# Patient Record
Sex: Male | Born: 1945 | Race: White | Hispanic: No | Marital: Married | Smoking: Never smoker
Health system: Southern US, Community
[De-identification: ages and names within clinical notes are randomized; demographics above are authoritative.]

## PROBLEM LIST (undated history)

## (undated) DIAGNOSIS — I429 Cardiomyopathy, unspecified: Secondary | ICD-10-CM

## (undated) DIAGNOSIS — I447 Left bundle-branch block, unspecified: Secondary | ICD-10-CM

## (undated) DIAGNOSIS — E785 Hyperlipidemia, unspecified: Secondary | ICD-10-CM

## (undated) DIAGNOSIS — N529 Male erectile dysfunction, unspecified: Secondary | ICD-10-CM

## (undated) DIAGNOSIS — G43909 Migraine, unspecified, not intractable, without status migrainosus: Secondary | ICD-10-CM

## (undated) DIAGNOSIS — N183 Chronic kidney disease, stage 3 unspecified: Secondary | ICD-10-CM

## (undated) DIAGNOSIS — E039 Hypothyroidism, unspecified: Secondary | ICD-10-CM

## (undated) DIAGNOSIS — I2510 Atherosclerotic heart disease of native coronary artery without angina pectoris: Secondary | ICD-10-CM

## (undated) DIAGNOSIS — J189 Pneumonia, unspecified organism: Secondary | ICD-10-CM

## (undated) DIAGNOSIS — R51 Headache: Secondary | ICD-10-CM

## (undated) DIAGNOSIS — R351 Nocturia: Secondary | ICD-10-CM

## (undated) DIAGNOSIS — C801 Malignant (primary) neoplasm, unspecified: Secondary | ICD-10-CM

## (undated) DIAGNOSIS — N3281 Overactive bladder: Secondary | ICD-10-CM

## (undated) DIAGNOSIS — K219 Gastro-esophageal reflux disease without esophagitis: Secondary | ICD-10-CM

## (undated) DIAGNOSIS — R519 Headache, unspecified: Secondary | ICD-10-CM

## (undated) DIAGNOSIS — M1990 Unspecified osteoarthritis, unspecified site: Secondary | ICD-10-CM

## (undated) DIAGNOSIS — I10 Essential (primary) hypertension: Secondary | ICD-10-CM

## (undated) HISTORY — DX: Hyperlipidemia, unspecified: E78.5

## (undated) HISTORY — PX: CARDIAC CATHETERIZATION: SHX172

## (undated) HISTORY — PX: CARPAL TUNNEL RELEASE: SHX101

## (undated) HISTORY — PX: PROSTATE SURGERY: SHX751

## (undated) HISTORY — DX: Male erectile dysfunction, unspecified: N52.9

## (undated) HISTORY — DX: Migraine, unspecified, not intractable, without status migrainosus: G43.909

## (undated) HISTORY — DX: Chronic kidney disease, stage 3 unspecified: N18.30

---

## 2003-02-06 ENCOUNTER — Ambulatory Visit: Admission: RE | Admit: 2003-02-06 | Discharge: 2003-02-24 | Payer: Self-pay | Admitting: Radiation Oncology

## 2003-03-10 ENCOUNTER — Ambulatory Visit: Admission: RE | Admit: 2003-03-10 | Discharge: 2003-04-30 | Payer: Self-pay | Admitting: Radiation Oncology

## 2006-02-16 ENCOUNTER — Ambulatory Visit (HOSPITAL_COMMUNITY): Admission: RE | Admit: 2006-02-16 | Discharge: 2006-02-17 | Payer: Self-pay | Admitting: Urology

## 2011-11-14 DIAGNOSIS — H524 Presbyopia: Secondary | ICD-10-CM | POA: Diagnosis not present

## 2011-11-14 DIAGNOSIS — H40059 Ocular hypertension, unspecified eye: Secondary | ICD-10-CM | POA: Diagnosis not present

## 2011-11-14 DIAGNOSIS — H40009 Preglaucoma, unspecified, unspecified eye: Secondary | ICD-10-CM | POA: Diagnosis not present

## 2011-11-25 DIAGNOSIS — L570 Actinic keratosis: Secondary | ICD-10-CM | POA: Diagnosis not present

## 2012-02-10 DIAGNOSIS — E785 Hyperlipidemia, unspecified: Secondary | ICD-10-CM | POA: Diagnosis not present

## 2012-02-10 DIAGNOSIS — I10 Essential (primary) hypertension: Secondary | ICD-10-CM | POA: Diagnosis not present

## 2012-03-23 DIAGNOSIS — Z85820 Personal history of malignant melanoma of skin: Secondary | ICD-10-CM | POA: Diagnosis not present

## 2012-03-23 DIAGNOSIS — L821 Other seborrheic keratosis: Secondary | ICD-10-CM | POA: Diagnosis not present

## 2012-03-23 DIAGNOSIS — T148 Other injury of unspecified body region: Secondary | ICD-10-CM | POA: Diagnosis not present

## 2012-03-23 DIAGNOSIS — D239 Other benign neoplasm of skin, unspecified: Secondary | ICD-10-CM | POA: Diagnosis not present

## 2012-03-23 DIAGNOSIS — L819 Disorder of pigmentation, unspecified: Secondary | ICD-10-CM | POA: Diagnosis not present

## 2012-03-23 DIAGNOSIS — L570 Actinic keratosis: Secondary | ICD-10-CM | POA: Diagnosis not present

## 2012-04-16 DIAGNOSIS — C61 Malignant neoplasm of prostate: Secondary | ICD-10-CM | POA: Diagnosis not present

## 2012-04-16 DIAGNOSIS — I10 Essential (primary) hypertension: Secondary | ICD-10-CM | POA: Diagnosis not present

## 2012-04-16 DIAGNOSIS — E785 Hyperlipidemia, unspecified: Secondary | ICD-10-CM | POA: Diagnosis not present

## 2012-04-16 DIAGNOSIS — K219 Gastro-esophageal reflux disease without esophagitis: Secondary | ICD-10-CM | POA: Diagnosis not present

## 2012-04-16 DIAGNOSIS — Z23 Encounter for immunization: Secondary | ICD-10-CM | POA: Diagnosis not present

## 2012-04-16 DIAGNOSIS — Z0000 Encounter for general adult medical examination without abnormal findings: Secondary | ICD-10-CM | POA: Diagnosis not present

## 2012-07-09 DIAGNOSIS — L570 Actinic keratosis: Secondary | ICD-10-CM | POA: Diagnosis not present

## 2012-08-31 DIAGNOSIS — Z23 Encounter for immunization: Secondary | ICD-10-CM | POA: Diagnosis not present

## 2012-09-07 DIAGNOSIS — L570 Actinic keratosis: Secondary | ICD-10-CM | POA: Diagnosis not present

## 2012-09-07 DIAGNOSIS — L821 Other seborrheic keratosis: Secondary | ICD-10-CM | POA: Diagnosis not present

## 2012-10-04 DIAGNOSIS — M1710 Unilateral primary osteoarthritis, unspecified knee: Secondary | ICD-10-CM | POA: Diagnosis not present

## 2012-10-15 DIAGNOSIS — M1710 Unilateral primary osteoarthritis, unspecified knee: Secondary | ICD-10-CM | POA: Diagnosis not present

## 2012-10-18 DIAGNOSIS — L570 Actinic keratosis: Secondary | ICD-10-CM | POA: Diagnosis not present

## 2012-10-18 DIAGNOSIS — L821 Other seborrheic keratosis: Secondary | ICD-10-CM | POA: Diagnosis not present

## 2012-10-22 DIAGNOSIS — M1710 Unilateral primary osteoarthritis, unspecified knee: Secondary | ICD-10-CM | POA: Diagnosis not present

## 2012-10-29 DIAGNOSIS — M1710 Unilateral primary osteoarthritis, unspecified knee: Secondary | ICD-10-CM | POA: Diagnosis not present

## 2012-11-05 DIAGNOSIS — M1710 Unilateral primary osteoarthritis, unspecified knee: Secondary | ICD-10-CM | POA: Diagnosis not present

## 2012-11-12 DIAGNOSIS — M1710 Unilateral primary osteoarthritis, unspecified knee: Secondary | ICD-10-CM | POA: Diagnosis not present

## 2012-11-19 DIAGNOSIS — M1710 Unilateral primary osteoarthritis, unspecified knee: Secondary | ICD-10-CM | POA: Diagnosis not present

## 2013-04-02 DIAGNOSIS — Z85828 Personal history of other malignant neoplasm of skin: Secondary | ICD-10-CM | POA: Diagnosis not present

## 2013-04-02 DIAGNOSIS — D239 Other benign neoplasm of skin, unspecified: Secondary | ICD-10-CM | POA: Diagnosis not present

## 2013-04-02 DIAGNOSIS — L570 Actinic keratosis: Secondary | ICD-10-CM | POA: Diagnosis not present

## 2013-04-02 DIAGNOSIS — L819 Disorder of pigmentation, unspecified: Secondary | ICD-10-CM | POA: Diagnosis not present

## 2013-04-02 DIAGNOSIS — L821 Other seborrheic keratosis: Secondary | ICD-10-CM | POA: Diagnosis not present

## 2013-04-02 DIAGNOSIS — Z85820 Personal history of malignant melanoma of skin: Secondary | ICD-10-CM | POA: Diagnosis not present

## 2013-05-13 DIAGNOSIS — M1610 Unilateral primary osteoarthritis, unspecified hip: Secondary | ICD-10-CM | POA: Diagnosis not present

## 2013-07-29 DIAGNOSIS — T148 Other injury of unspecified body region: Secondary | ICD-10-CM | POA: Diagnosis not present

## 2013-07-29 DIAGNOSIS — IMO0001 Reserved for inherently not codable concepts without codable children: Secondary | ICD-10-CM | POA: Diagnosis not present

## 2013-07-29 DIAGNOSIS — I10 Essential (primary) hypertension: Secondary | ICD-10-CM | POA: Diagnosis not present

## 2013-07-29 DIAGNOSIS — R21 Rash and other nonspecific skin eruption: Secondary | ICD-10-CM | POA: Diagnosis not present

## 2013-07-29 DIAGNOSIS — R209 Unspecified disturbances of skin sensation: Secondary | ICD-10-CM | POA: Diagnosis not present

## 2013-08-06 DIAGNOSIS — L821 Other seborrheic keratosis: Secondary | ICD-10-CM | POA: Diagnosis not present

## 2013-08-06 DIAGNOSIS — L723 Sebaceous cyst: Secondary | ICD-10-CM | POA: Diagnosis not present

## 2013-08-06 DIAGNOSIS — L259 Unspecified contact dermatitis, unspecified cause: Secondary | ICD-10-CM | POA: Diagnosis not present

## 2013-08-06 DIAGNOSIS — L570 Actinic keratosis: Secondary | ICD-10-CM | POA: Diagnosis not present

## 2013-09-20 DIAGNOSIS — E785 Hyperlipidemia, unspecified: Secondary | ICD-10-CM | POA: Diagnosis not present

## 2013-09-20 DIAGNOSIS — Z8546 Personal history of malignant neoplasm of prostate: Secondary | ICD-10-CM | POA: Diagnosis not present

## 2013-09-20 DIAGNOSIS — I10 Essential (primary) hypertension: Secondary | ICD-10-CM | POA: Diagnosis not present

## 2013-09-23 DIAGNOSIS — C61 Malignant neoplasm of prostate: Secondary | ICD-10-CM | POA: Diagnosis not present

## 2013-09-23 DIAGNOSIS — N529 Male erectile dysfunction, unspecified: Secondary | ICD-10-CM | POA: Diagnosis not present

## 2013-09-23 DIAGNOSIS — I10 Essential (primary) hypertension: Secondary | ICD-10-CM | POA: Diagnosis not present

## 2013-09-23 DIAGNOSIS — Z23 Encounter for immunization: Secondary | ICD-10-CM | POA: Diagnosis not present

## 2013-09-23 DIAGNOSIS — E785 Hyperlipidemia, unspecified: Secondary | ICD-10-CM | POA: Diagnosis not present

## 2013-09-23 DIAGNOSIS — M1990 Unspecified osteoarthritis, unspecified site: Secondary | ICD-10-CM | POA: Diagnosis not present

## 2013-11-04 DIAGNOSIS — M1610 Unilateral primary osteoarthritis, unspecified hip: Secondary | ICD-10-CM | POA: Diagnosis not present

## 2013-11-04 DIAGNOSIS — M1710 Unilateral primary osteoarthritis, unspecified knee: Secondary | ICD-10-CM | POA: Diagnosis not present

## 2014-01-06 DIAGNOSIS — M1710 Unilateral primary osteoarthritis, unspecified knee: Secondary | ICD-10-CM | POA: Diagnosis not present

## 2014-01-13 DIAGNOSIS — M1710 Unilateral primary osteoarthritis, unspecified knee: Secondary | ICD-10-CM | POA: Diagnosis not present

## 2014-01-21 DIAGNOSIS — M1710 Unilateral primary osteoarthritis, unspecified knee: Secondary | ICD-10-CM | POA: Diagnosis not present

## 2014-04-15 DIAGNOSIS — L570 Actinic keratosis: Secondary | ICD-10-CM | POA: Diagnosis not present

## 2014-04-15 DIAGNOSIS — L821 Other seborrheic keratosis: Secondary | ICD-10-CM | POA: Diagnosis not present

## 2014-04-15 DIAGNOSIS — L819 Disorder of pigmentation, unspecified: Secondary | ICD-10-CM | POA: Diagnosis not present

## 2014-04-15 DIAGNOSIS — Z85828 Personal history of other malignant neoplasm of skin: Secondary | ICD-10-CM | POA: Diagnosis not present

## 2014-04-15 DIAGNOSIS — D485 Neoplasm of uncertain behavior of skin: Secondary | ICD-10-CM | POA: Diagnosis not present

## 2014-04-15 DIAGNOSIS — Z85820 Personal history of malignant melanoma of skin: Secondary | ICD-10-CM | POA: Diagnosis not present

## 2014-04-15 DIAGNOSIS — D239 Other benign neoplasm of skin, unspecified: Secondary | ICD-10-CM | POA: Diagnosis not present

## 2014-04-15 DIAGNOSIS — D692 Other nonthrombocytopenic purpura: Secondary | ICD-10-CM | POA: Diagnosis not present

## 2014-04-16 DIAGNOSIS — D0430 Carcinoma in situ of skin of unspecified part of face: Secondary | ICD-10-CM | POA: Diagnosis not present

## 2014-04-16 DIAGNOSIS — D0439 Carcinoma in situ of skin of other parts of face: Secondary | ICD-10-CM | POA: Diagnosis not present

## 2014-04-22 DIAGNOSIS — C61 Malignant neoplasm of prostate: Secondary | ICD-10-CM | POA: Diagnosis not present

## 2014-04-22 DIAGNOSIS — Z1159 Encounter for screening for other viral diseases: Secondary | ICD-10-CM | POA: Diagnosis not present

## 2014-04-22 DIAGNOSIS — Z23 Encounter for immunization: Secondary | ICD-10-CM | POA: Diagnosis not present

## 2014-04-22 DIAGNOSIS — Z1211 Encounter for screening for malignant neoplasm of colon: Secondary | ICD-10-CM | POA: Diagnosis not present

## 2014-04-22 DIAGNOSIS — I10 Essential (primary) hypertension: Secondary | ICD-10-CM | POA: Diagnosis not present

## 2014-04-22 DIAGNOSIS — N318 Other neuromuscular dysfunction of bladder: Secondary | ICD-10-CM | POA: Diagnosis not present

## 2014-04-22 DIAGNOSIS — E785 Hyperlipidemia, unspecified: Secondary | ICD-10-CM | POA: Diagnosis not present

## 2014-04-22 DIAGNOSIS — Z0000 Encounter for general adult medical examination without abnormal findings: Secondary | ICD-10-CM | POA: Diagnosis not present

## 2014-05-01 DIAGNOSIS — D0439 Carcinoma in situ of skin of other parts of face: Secondary | ICD-10-CM | POA: Diagnosis not present

## 2014-05-01 DIAGNOSIS — D0430 Carcinoma in situ of skin of unspecified part of face: Secondary | ICD-10-CM | POA: Diagnosis not present

## 2014-05-27 DIAGNOSIS — Z1211 Encounter for screening for malignant neoplasm of colon: Secondary | ICD-10-CM | POA: Diagnosis not present

## 2014-06-18 DIAGNOSIS — I10 Essential (primary) hypertension: Secondary | ICD-10-CM | POA: Diagnosis not present

## 2014-06-18 DIAGNOSIS — R609 Edema, unspecified: Secondary | ICD-10-CM | POA: Diagnosis not present

## 2014-08-04 DIAGNOSIS — H3531 Nonexudative age-related macular degeneration: Secondary | ICD-10-CM | POA: Diagnosis not present

## 2014-08-04 DIAGNOSIS — H35033 Hypertensive retinopathy, bilateral: Secondary | ICD-10-CM | POA: Diagnosis not present

## 2014-08-04 DIAGNOSIS — H01009 Unspecified blepharitis unspecified eye, unspecified eyelid: Secondary | ICD-10-CM | POA: Diagnosis not present

## 2014-08-04 DIAGNOSIS — H524 Presbyopia: Secondary | ICD-10-CM | POA: Diagnosis not present

## 2014-09-16 ENCOUNTER — Emergency Department: Payer: Self-pay | Admitting: Emergency Medicine

## 2014-09-16 DIAGNOSIS — I6523 Occlusion and stenosis of bilateral carotid arteries: Secondary | ICD-10-CM | POA: Diagnosis not present

## 2014-09-16 DIAGNOSIS — Z79899 Other long term (current) drug therapy: Secondary | ICD-10-CM | POA: Diagnosis not present

## 2014-09-16 DIAGNOSIS — I10 Essential (primary) hypertension: Secondary | ICD-10-CM | POA: Diagnosis not present

## 2014-09-16 DIAGNOSIS — Z880 Allergy status to penicillin: Secondary | ICD-10-CM | POA: Diagnosis not present

## 2014-09-16 DIAGNOSIS — R51 Headache: Secondary | ICD-10-CM | POA: Diagnosis not present

## 2014-09-16 DIAGNOSIS — R4789 Other speech disturbances: Secondary | ICD-10-CM | POA: Diagnosis not present

## 2014-09-16 LAB — CBC
HCT: 49.8 % (ref 40.0–52.0)
HGB: 16.3 g/dL (ref 13.0–18.0)
MCH: 29.4 pg (ref 26.0–34.0)
MCHC: 32.7 g/dL (ref 32.0–36.0)
MCV: 90 fL (ref 80–100)
PLATELETS: 229 10*3/uL (ref 150–440)
RBC: 5.54 10*6/uL (ref 4.40–5.90)
RDW: 12.7 % (ref 11.5–14.5)
WBC: 12.5 10*3/uL — ABNORMAL HIGH (ref 3.8–10.6)

## 2014-09-16 LAB — BASIC METABOLIC PANEL
ANION GAP: 9 (ref 7–16)
BUN: 22 mg/dL — AB (ref 7–18)
CHLORIDE: 101 mmol/L (ref 98–107)
Calcium, Total: 10.3 mg/dL — ABNORMAL HIGH (ref 8.5–10.1)
Co2: 27 mmol/L (ref 21–32)
Creatinine: 1.35 mg/dL — ABNORMAL HIGH (ref 0.60–1.30)
EGFR (African American): >60
EGFR (Non-African Amer.): 56 — ABNORMAL LOW
Glucose: 100 mg/dL — ABNORMAL HIGH (ref 65–99)
OSMOLALITY: 277 (ref 275–301)
POTASSIUM: 3.9 mmol/L (ref 3.5–5.1)
Sodium: 137 mmol/L (ref 136–145)

## 2014-09-19 DIAGNOSIS — K219 Gastro-esophageal reflux disease without esophagitis: Secondary | ICD-10-CM | POA: Diagnosis not present

## 2014-09-19 DIAGNOSIS — I10 Essential (primary) hypertension: Secondary | ICD-10-CM | POA: Diagnosis not present

## 2014-09-19 DIAGNOSIS — G43909 Migraine, unspecified, not intractable, without status migrainosus: Secondary | ICD-10-CM | POA: Diagnosis not present

## 2014-09-19 DIAGNOSIS — J309 Allergic rhinitis, unspecified: Secondary | ICD-10-CM | POA: Diagnosis not present

## 2014-09-19 DIAGNOSIS — E785 Hyperlipidemia, unspecified: Secondary | ICD-10-CM | POA: Diagnosis not present

## 2014-09-19 DIAGNOSIS — C61 Malignant neoplasm of prostate: Secondary | ICD-10-CM | POA: Diagnosis not present

## 2014-09-19 DIAGNOSIS — M1990 Unspecified osteoarthritis, unspecified site: Secondary | ICD-10-CM | POA: Diagnosis not present

## 2015-04-16 DIAGNOSIS — D0439 Carcinoma in situ of skin of other parts of face: Secondary | ICD-10-CM | POA: Diagnosis not present

## 2015-04-16 DIAGNOSIS — Z85820 Personal history of malignant melanoma of skin: Secondary | ICD-10-CM | POA: Diagnosis not present

## 2015-04-16 DIAGNOSIS — L821 Other seborrheic keratosis: Secondary | ICD-10-CM | POA: Diagnosis not present

## 2015-04-16 DIAGNOSIS — D1800 Hemangioma unspecified site: Secondary | ICD-10-CM | POA: Diagnosis not present

## 2015-04-16 DIAGNOSIS — Z85828 Personal history of other malignant neoplasm of skin: Secondary | ICD-10-CM | POA: Diagnosis not present

## 2015-04-16 DIAGNOSIS — L570 Actinic keratosis: Secondary | ICD-10-CM | POA: Diagnosis not present

## 2015-04-16 DIAGNOSIS — L814 Other melanin hyperpigmentation: Secondary | ICD-10-CM | POA: Diagnosis not present

## 2015-04-16 DIAGNOSIS — D225 Melanocytic nevi of trunk: Secondary | ICD-10-CM | POA: Diagnosis not present

## 2015-07-09 DIAGNOSIS — I10 Essential (primary) hypertension: Secondary | ICD-10-CM | POA: Diagnosis not present

## 2015-07-09 DIAGNOSIS — E785 Hyperlipidemia, unspecified: Secondary | ICD-10-CM | POA: Diagnosis not present

## 2015-07-09 DIAGNOSIS — Z8546 Personal history of malignant neoplasm of prostate: Secondary | ICD-10-CM | POA: Diagnosis not present

## 2015-07-09 DIAGNOSIS — M1990 Unspecified osteoarthritis, unspecified site: Secondary | ICD-10-CM | POA: Diagnosis not present

## 2015-07-09 DIAGNOSIS — Z1211 Encounter for screening for malignant neoplasm of colon: Secondary | ICD-10-CM | POA: Diagnosis not present

## 2015-07-09 DIAGNOSIS — Z23 Encounter for immunization: Secondary | ICD-10-CM | POA: Diagnosis not present

## 2015-07-09 DIAGNOSIS — Z0000 Encounter for general adult medical examination without abnormal findings: Secondary | ICD-10-CM | POA: Diagnosis not present

## 2015-07-21 DIAGNOSIS — M1612 Unilateral primary osteoarthritis, left hip: Secondary | ICD-10-CM | POA: Diagnosis not present

## 2015-07-23 DIAGNOSIS — Z1211 Encounter for screening for malignant neoplasm of colon: Secondary | ICD-10-CM | POA: Diagnosis not present

## 2015-08-03 DIAGNOSIS — D0439 Carcinoma in situ of skin of other parts of face: Secondary | ICD-10-CM | POA: Diagnosis not present

## 2015-08-03 DIAGNOSIS — L309 Dermatitis, unspecified: Secondary | ICD-10-CM | POA: Diagnosis not present

## 2015-08-03 DIAGNOSIS — L820 Inflamed seborrheic keratosis: Secondary | ICD-10-CM | POA: Diagnosis not present

## 2015-08-03 DIAGNOSIS — L570 Actinic keratosis: Secondary | ICD-10-CM | POA: Diagnosis not present

## 2015-08-04 DIAGNOSIS — L820 Inflamed seborrheic keratosis: Secondary | ICD-10-CM | POA: Diagnosis not present

## 2015-09-02 DIAGNOSIS — M1612 Unilateral primary osteoarthritis, left hip: Secondary | ICD-10-CM | POA: Diagnosis not present

## 2015-09-02 DIAGNOSIS — M25562 Pain in left knee: Secondary | ICD-10-CM | POA: Diagnosis not present

## 2015-09-02 DIAGNOSIS — M25552 Pain in left hip: Secondary | ICD-10-CM | POA: Diagnosis not present

## 2015-10-06 NOTE — Patient Instructions (Signed)
[facility]  10/06/2015   Your procedure is scheduled on: 10/16/2015   Report to [facility] Main  Entrance take [place]  elevators to 3rd floor to  [place] at   [place] AM.  Call this number if you have problems the morning of surgery [phone]   Remember: ONLY 1 PERSON MAY GO WITH YOU TO SHORT STAY TO GET  READY MORNING OF YOUR SURGERY.  Do not eat food or drink liquids :After Midnight.     Take these medicines the morning of surgery with A SIP OF WATER: none                                 You may not have any metal on your body including hair pins and              piercings  Do not wear jewelry, , lotions, powders or perfumes, deodorant            .              Men may shave face and neck.   Do not bring valuables to the hospital. [place].  Contacts, dentures or bridgework may not be worn into surgery.  Leave suitcase in the car. After surgery it may be brought to your room.     Marland Kitchen    Special Instructions:coughing and deep breathing exercises, leg exercises               Please read over the following fact sheets you were given: _____________________________________________________________________             [facility] - Preparing for Surgery Before surgery, you can play an important role.  Because skin is not sterile, your skin needs to be as free of germs as possible.  You can reduce the number of germs on your skin by washing with CHG (chlorahexidine gluconate) soap before surgery.  CHG is an antiseptic cleaner which kills germs and bonds with the skin to continue killing germs even after washing. Please DO NOT use if you have an allergy to CHG or antibacterial soaps.  If your skin becomes reddened/irritated stop using the CHG and inform your nurse when you arrive at Short Stay. Do not shave (including legs and underarms) for at least 48 hours prior to the first CHG shower.  You  may shave your face/neck. Please follow these instructions carefully:  1.  Shower with CHG Soap the night before surgery and the  morning of Surgery.  2.  If you choose to wash your hair, wash your hair first as usual with your  normal  shampoo.  3.  After you shampoo, rinse your hair and body thoroughly to remove the  shampoo.                           4.  Use CHG as you would any other liquid soap.  You can apply chg directly  to the skin and wash                       Gently with a scrungie or clean washcloth.  5.  Apply the  CHG Soap to your body ONLY FROM THE NECK DOWN.   Do not use on face/ open                           Wound or open sores. Avoid contact with eyes, ears mouth and genitals (private parts).                       Wash face,  Genitals (private parts) with your normal soap.             6.  Wash thoroughly, paying special attention to the area where your surgery  will be performed.  7.  Thoroughly rinse your body with warm water from the neck down.  8.  DO NOT shower/wash with your normal soap after using and rinsing off  the CHG Soap.                9.  Pat yourself dry with a clean towel.            10.  Wear clean pajamas.            11.  Place clean sheets on your bed the night of your first shower and do not  sleep with pets. Day of Surgery : Do not apply any lotions/deodorants the morning of surgery.  Please wear clean clothes to the hospital/surgery center.  FAILURE TO FOLLOW THESE INSTRUCTIONS MAY RESULT IN THE CANCELLATION OF YOUR SURGERY PATIENT SIGNATURE_________________________________  NURSE SIGNATURE__________________________________  ________________________________________________________________________  WHAT IS A BLOOD TRANSFUSION? Blood Transfusion Information  A transfusion is the replacement of blood or some of its parts. Blood is made up of multiple cells which provide different functions.  Red blood cells carry oxygen and are used for blood loss  replacement.  White blood cells fight against infection.  Platelets control bleeding.  Plasma helps clot blood.  Other blood products are available for specialized needs, such as hemophilia or other clotting disorders. BEFORE THE TRANSFUSION  Who gives blood for transfusions?   Healthy volunteers who are fully evaluated to make sure their blood is safe. This is blood bank blood. Transfusion therapy is the safest it has ever been in the practice of medicine. Before blood is taken from a donor, a complete history is taken to make sure that person has no history of diseases nor engages in risky social behavior (examples are intravenous drug use or sexual activity with multiple partners). The donor's travel history is screened to minimize risk of transmitting infections, such as malaria. The donated blood is tested for signs of infectious diseases, such as HIV and hepatitis. The blood is then tested to be sure it is compatible with you in order to minimize the chance of a transfusion reaction. If you or a relative donates blood, this is often done in anticipation of surgery and is not appropriate for emergency situations. It takes many days to process the donated blood. RISKS AND COMPLICATIONS Although transfusion therapy is very safe and saves many lives, the main dangers of transfusion include:  1. Getting an infectious disease. 2. Developing a transfusion reaction. This is an allergic reaction to something in the blood you were given. Every precaution is taken to prevent this. The decision to have a blood transfusion has been considered carefully by your caregiver before blood is given. Blood is not given unless the benefits outweigh the risks. AFTER THE TRANSFUSION  Right after receiving a blood transfusion,  you will usually feel much better and more energetic. This is especially true if your red blood cells have gotten low (anemic). The transfusion raises the level of the red blood cells which  carry oxygen, and this usually causes an energy increase.  The nurse administering the transfusion will monitor you carefully for complications. HOME CARE INSTRUCTIONS  No special instructions are needed after a transfusion. You may find your energy is better. Speak with your caregiver about any limitations on activity for underlying diseases you may have. SEEK MEDICAL CARE IF:   Your condition is not improving after your transfusion.  You develop redness or irritation at the intravenous (IV) site. SEEK IMMEDIATE MEDICAL CARE IF:  Any of the following symptoms occur over the next 12 hours:  Shaking chills.  You have a temperature by mouth above 102 F (38.9 C), not controlled by medicine.  Chest, back, or muscle pain.  People around you feel you are not acting correctly or are confused.  Shortness of breath or difficulty breathing.  Dizziness and fainting.  You get a rash or develop hives.  You have a decrease in urine output.  Your urine turns a dark color or changes to pink, red, or brown. Any of the following symptoms occur over the next 10 days:  You have a temperature by mouth above 102 F (38.9 C), not controlled by medicine.  Shortness of breath.  Weakness after normal activity.  The white part of the eye turns yellow (jaundice).  You have a decrease in the amount of urine or are urinating less often.  Your urine turns a dark color or changes to pink, red, or brown. Document Released: 09/30/2000 Document Revised: 12/26/2011 Document Reviewed: 05/19/2008 ExitCare Patient Information 2014 [place].  _______________________________________________________________________  Incentive Spirometer  An incentive spirometer is a tool that can help keep your lungs clear and active. This tool measures how well you are filling your lungs with each breath. Taking long deep breaths may help reverse or decrease the chance of developing breathing (pulmonary) problems  (especially infection) following:  A long period of time when you are unable to move or be active. BEFORE THE PROCEDURE   If the spirometer includes an indicator to show your best effort, your nurse or respiratory therapist will set it to a desired goal.  If possible, sit up straight or lean slightly forward. Try not to slouch.  Hold the incentive spirometer in an upright position. INSTRUCTIONS FOR USE  3. Sit on the edge of your bed if possible, or sit up as far as you can in bed or on a chair. 4. Hold the incentive spirometer in an upright position. 5. Breathe out normally. 6. Place the mouthpiece in your mouth and seal your lips tightly around it. 7. Breathe in slowly and as deeply as possible, raising the piston or the ball toward the top of the column. 8. Hold your breath for 3-5 seconds or for as long as possible. Allow the piston or ball to fall to the bottom of the column. 9. Remove the mouthpiece from your mouth and breathe out normally. 10. Rest for a few seconds and repeat Steps 1 through 7 at least 10 times every 1-2 hours when you are awake. Take your time and take a few normal breaths between deep breaths. 11. The spirometer may include an indicator to show your best effort. Use the indicator as a goal to work toward during each repetition. 12. After each set of 10 deep breaths,  practice coughing to be sure your lungs are clear. If you have an incision (the cut made at the time of surgery), support your incision when coughing by placing a pillow or rolled up towels firmly against it. Once you are able to get out of bed, walk around indoors and cough well. You may stop using the incentive spirometer when instructed by your caregiver.  RISKS AND COMPLICATIONS  Take your time so you do not get dizzy or light-headed.  If you are in pain, you may need to take or ask for pain medication before doing incentive spirometry. It is harder to take a deep breath if you are having  pain. AFTER USE  Rest and breathe slowly and easily.  It can be helpful to keep track of a log of your progress. Your caregiver can provide you with a simple table to help with this. If you are using the spirometer at home, follow these instructions: [place] IF:   You are having difficultly using the spirometer.  You have trouble using the spirometer as often as instructed.  Your pain medication is not giving enough relief while using the spirometer.  You develop fever of 100.5 F (38.1 C) or higher. SEEK IMMEDIATE MEDICAL CARE IF:   You cough up bloody sputum that had not been present before.  You develop fever of 102 F (38.9 C) or greater.  You develop worsening pain at or near the incision site. MAKE SURE YOU:   Understand these instructions.  Will watch your condition.  Will get help right away if you are not doing well or get worse. Document Released: 02/13/2007 Document Revised: 12/26/2011 Document Reviewed: 04/16/2007 [facility] Patient Information 2014 [place], [place].   ________________________________________________________________________

## 2015-10-07 ENCOUNTER — Encounter (HOSPITAL_COMMUNITY)
Admission: RE | Admit: 2015-10-07 | Discharge: 2015-10-07 | Disposition: A | Discharge status: Home / Self Care | Payer: Medicare Other | Source: Ambulatory Visit | Attending: Orthopedic Surgery | Admitting: Orthopedic Surgery

## 2015-10-07 ENCOUNTER — Encounter (HOSPITAL_COMMUNITY): Payer: Self-pay

## 2015-10-07 DIAGNOSIS — Z01818 Encounter for other preprocedural examination: Secondary | ICD-10-CM | POA: Insufficient documentation

## 2015-10-07 DIAGNOSIS — M1612 Unilateral primary osteoarthritis, left hip: Secondary | ICD-10-CM | POA: Diagnosis not present

## 2015-10-07 DIAGNOSIS — Z01812 Encounter for preprocedural laboratory examination: Secondary | ICD-10-CM | POA: Insufficient documentation

## 2015-10-07 DIAGNOSIS — Z0183 Encounter for blood typing: Secondary | ICD-10-CM | POA: Insufficient documentation

## 2015-10-07 HISTORY — DX: Essential (primary) hypertension: I10

## 2015-10-07 HISTORY — DX: Gastro-esophageal reflux disease without esophagitis: K21.9

## 2015-10-07 HISTORY — DX: Headache, unspecified: R51.9

## 2015-10-07 HISTORY — DX: Pneumonia, unspecified organism: J18.9

## 2015-10-07 HISTORY — DX: Headache: R51

## 2015-10-07 HISTORY — DX: Overactive bladder: N32.81

## 2015-10-07 HISTORY — DX: Unspecified osteoarthritis, unspecified site: M19.90

## 2015-10-07 HISTORY — DX: Malignant (primary) neoplasm, unspecified: C80.1

## 2015-10-07 HISTORY — DX: Nocturia: R35.1

## 2015-10-07 LAB — CBC
HCT: 43.1 % (ref 39.0–52.0)
HEMOGLOBIN: 14.8 g/dL (ref 13.0–17.0)
MCH: 30.1 pg (ref 26.0–34.0)
MCHC: 34.3 g/dL (ref 30.0–36.0)
MCV: 87.6 fL (ref 78.0–100.0)
Platelets: 195 10*3/uL (ref 150–400)
RBC: 4.92 MIL/uL (ref 4.22–5.81)
RDW: 12.3 % (ref 11.5–15.5)
WBC: 7 10*3/uL (ref 4.0–10.5)

## 2015-10-07 LAB — URINALYSIS, ROUTINE W REFLEX MICROSCOPIC
Bilirubin Urine: NEGATIVE
GLUCOSE, UA: NEGATIVE mg/dL
KETONES UR: NEGATIVE mg/dL
LEUKOCYTES UA: NEGATIVE
Nitrite: NEGATIVE
PROTEIN: NEGATIVE mg/dL
Specific Gravity, Urine: 1.026 (ref 1.005–1.030)
pH: 5 (ref 5.0–8.0)

## 2015-10-07 LAB — BASIC METABOLIC PANEL
ANION GAP: 8 (ref 5–15)
BUN: 31 mg/dL — ABNORMAL HIGH (ref 6–20)
CHLORIDE: 104 mmol/L (ref 101–111)
CO2: 30 mmol/L (ref 22–32)
CREATININE: 1.42 mg/dL — AB (ref 0.61–1.24)
Calcium: 9.4 mg/dL (ref 8.9–10.3)
GFR calc non Af Amer: 49 mL/min — ABNORMAL LOW (ref >60)
GFR, EST AFRICAN AMERICAN: 56 mL/min — AB (ref >60)
Glucose, Bld: 97 mg/dL (ref 65–99)
POTASSIUM: 3.9 mmol/L (ref 3.5–5.1)
SODIUM: 142 mmol/L (ref 135–145)

## 2015-10-07 LAB — SURGICAL PCR SCREEN
MRSA, PCR: NEGATIVE
Staphylococcus aureus: NEGATIVE

## 2015-10-07 LAB — APTT: aPTT: 31 seconds (ref 24–37)

## 2015-10-07 LAB — URINE MICROSCOPIC-ADD ON: WBC, UA: NONE SEEN WBC/hpf (ref 0–5)

## 2015-10-07 LAB — PROTIME-INR
INR: 1.03 (ref 0.00–1.49)
Prothrombin Time: 13.7 seconds (ref 11.6–15.2)

## 2015-10-07 NOTE — Progress Notes (Signed)
BMP done 10/07/15 faxed via EPIC to Dr Alvan Dame.

## 2015-10-07 NOTE — Progress Notes (Signed)
10/07/15 Final EKG in EPIC

## 2015-10-07 NOTE — Progress Notes (Signed)
Requested LOV note from office of Dr Azalia Bilis.

## 2015-10-07 NOTE — Progress Notes (Signed)
U/A and micro results done 10/07/15 faxed via EPIC to Dr Alvan Dame.

## 2015-10-07 NOTE — Progress Notes (Signed)
Dr Kenton Kingfisher- clearance on chart- 07/09/15.

## 2015-10-07 NOTE — Progress Notes (Signed)
BMP results done 10/07/15 faxed via EPIC to Dr Alvan Dame.

## 2015-10-08 NOTE — Progress Notes (Signed)
Rerequested LOV note from office of Dr Azalia Bilis.

## 2015-10-08 NOTE — Progress Notes (Signed)
Patient has allergy to Penicillin- rash.  Ancef ordered FYI.

## 2015-10-14 NOTE — H&P (Signed)
TOTAL HIP ADMISSION H&P  Patient is admitted for left total hip arthroplasty, anterior approach.  Subjective:  Chief Complaint:    Left hip primary OA / pain  HPI: NAME Tyler David, 69 y.o. male, has a history of pain and functional disability in the left hip(s) due to arthritis and patient has failed non-surgical conservative treatments for greater than 12 weeks to include NSAID's and/or analgesics and activity modification.  Onset of symptoms was gradual starting 3-4 years ago with gradually worsening course since that time.The patient noted no past surgery on the left hip(s).  Patient currently rates pain in the left hip at 8 out of 10 with activity. Patient has worsening of pain with activity and weight bearing, trendelenberg gait, pain that interfers with activities of daily living, pain with passive range of motion, crepitus and joint swelling. Patient has evidence of periarticular osteophytes and joint space narrowing by imaging studies. This condition presents safety issues increasing the risk of falls.   There is no current active infection.  Risks, benefits and expectations were discussed with the patient.  Risks including but not limited to the risk of anesthesia, blood clots, nerve damage, blood vessel damage, failure of the prosthesis, infection and up to and including death.  Patient understand the risks, benefits and expectations and wishes to proceed with surgery.   PCP: Shirline Frees, MD  D/C Plans:      Home with HHPT  Post-op Meds:       No Rx given  Tranexamic Acid:      To be given - IV  Decadron:      Is to be given  FYI:     ASA post-op  Norco post-op  Per PCP - watch kidney function    Past Medical History  Diagnosis Date  . Hypertension   . Pneumonia     hx of at age 53   . Nocturia   . GERD (gastroesophageal reflux disease)   . Headache     hx of migraine-2015   . Arthritis   . Overactive bladder   . Cancer [facility])     prostate cancer     Past Surgical  History  Procedure Laterality Date  . Prostate surgery      No prescriptions prior to admission   Allergies  Allergen Reactions  . Statins Other (See Comments)    Muscle pain  . Penicillins Rash    Has patient had a PCN reaction causing immediate rash, facial/tongue/throat swelling, SOB or lightheadedness with hypotension: No Has patient had a PCN reaction causing severe rash involving mucus membranes or skin necrosis: No Has patient had a PCN reaction that required hospitalization No Has patient had a PCN reaction occurring within the last 10 years: No If all of the above answers are "NO", then may proceed with Cephalosporin use.     Social History  Substance Use Topics  . Smoking status: Never Smoker   . Smokeless tobacco: Never Used  . Alcohol Use: Yes     Comment: rare       Review of Systems  Constitutional: Negative.   HENT: Positive for tinnitus.   Eyes: Negative.   Respiratory: Negative.   Cardiovascular: Negative.   Gastrointestinal: Positive for heartburn.  Genitourinary: Positive for frequency.  Musculoskeletal: Positive for joint pain.  Skin: Negative.   Neurological: Positive for headaches.  Endo/Heme/Allergies: Negative.   Psychiatric/Behavioral: Negative.     Objective:  Physical Exam  Constitutional: He is oriented to person, place, and  time. He appears well-developed.  HENT:  Head: Normocephalic.  Eyes: Pupils are equal, round, and reactive to light.  Neck: Neck supple. No JVD present. No tracheal deviation present. No thyromegaly present.  Cardiovascular: Normal rate, regular rhythm, normal heart sounds and intact distal pulses.   Respiratory: Effort normal and breath sounds normal. No stridor. No respiratory distress. He has no wheezes.  GI: Soft. There is no tenderness. There is no guarding.  Musculoskeletal:       Left hip: He exhibits decreased range of motion, decreased strength, tenderness and bony tenderness. He exhibits no swelling, no  deformity and no laceration.  Lymphadenopathy:    He has no cervical adenopathy.  Neurological: He is alert and oriented to person, place, and time.  Skin: Skin is warm and dry.  Psychiatric: He has a normal mood and affect.     Imaging Review Plain radiographs demonstrate severe degenerative joint disease of the left hip(s). The bone quality appears to be good for age and reported activity level.  Assessment/Plan:  End stage arthritis, left hip(s)  The patient history, physical examination, clinical judgement of the provider and imaging studies are consistent with end stage degenerative joint disease of the left hip(s) and total hip arthroplasty is deemed medically necessary. The treatment options including medical management, injection therapy, arthroscopy and arthroplasty were discussed at length. The risks and benefits of total hip arthroplasty were presented and reviewed. The risks due to aseptic loosening, infection, stiffness, dislocation/subluxation,  thromboembolic complications and other imponderables were discussed.  The patient acknowledged the explanation, agreed to proceed with the plan and consent was signed. Patient is being admitted for inpatient treatment for surgery, pain control, PT, OT, prophylactic antibiotics, VTE prophylaxis, progressive ambulation and ADL's and discharge planning.The patient is planning to be discharged home with home health services.      [place] Pugh Arlon Bleier   PA-C  10/14/2015, 10:13 PM

## 2015-10-15 NOTE — Progress Notes (Signed)
LOV - 07/09/15- placed on chart from PCP- Dr Shirline Frees.

## 2015-10-16 ENCOUNTER — Inpatient Hospital Stay (HOSPITAL_COMMUNITY): Payer: Medicare Other

## 2015-10-16 ENCOUNTER — Encounter (HOSPITAL_COMMUNITY)
Admission: RE | Disposition: A | Discharge status: Home Health | Payer: Self-pay | Source: Ambulatory Visit | Attending: Orthopedic Surgery

## 2015-10-16 ENCOUNTER — Inpatient Hospital Stay (HOSPITAL_COMMUNITY)
Admission: RE | Admit: 2015-10-16 | Discharge: 2015-10-17 | DRG: 470 | Disposition: A | Discharge status: Home Health | Payer: Medicare Other | Source: Ambulatory Visit | Attending: Orthopedic Surgery | Admitting: Orthopedic Surgery

## 2015-10-16 ENCOUNTER — Encounter (HOSPITAL_COMMUNITY): Payer: Self-pay | Admitting: *Deleted

## 2015-10-16 ENCOUNTER — Inpatient Hospital Stay (HOSPITAL_COMMUNITY): Payer: Medicare Other | Admitting: Anesthesiology

## 2015-10-16 DIAGNOSIS — Z8546 Personal history of malignant neoplasm of prostate: Secondary | ICD-10-CM | POA: Diagnosis not present

## 2015-10-16 DIAGNOSIS — Z01812 Encounter for preprocedural laboratory examination: Secondary | ICD-10-CM

## 2015-10-16 DIAGNOSIS — M25552 Pain in left hip: Secondary | ICD-10-CM | POA: Diagnosis not present

## 2015-10-16 DIAGNOSIS — M1612 Unilateral primary osteoarthritis, left hip: Secondary | ICD-10-CM | POA: Diagnosis present

## 2015-10-16 DIAGNOSIS — M169 Osteoarthritis of hip, unspecified: Secondary | ICD-10-CM | POA: Diagnosis not present

## 2015-10-16 DIAGNOSIS — K219 Gastro-esophageal reflux disease without esophagitis: Secondary | ICD-10-CM | POA: Diagnosis present

## 2015-10-16 DIAGNOSIS — I10 Essential (primary) hypertension: Secondary | ICD-10-CM | POA: Diagnosis present

## 2015-10-16 DIAGNOSIS — Z419 Encounter for procedure for purposes other than remedying health state, unspecified: Secondary | ICD-10-CM

## 2015-10-16 DIAGNOSIS — Z96649 Presence of unspecified artificial hip joint: Secondary | ICD-10-CM

## 2015-10-16 HISTORY — PX: TOTAL HIP ARTHROPLASTY: SHX124

## 2015-10-16 LAB — TYPE AND SCREEN
ABO/RH(D): O POS
Antibody Screen: NEGATIVE

## 2015-10-16 SURGERY — ARTHROPLASTY, HIP, TOTAL, ANTERIOR APPROACH
Anesthesia: Spinal | Site: Hip | Laterality: Left

## 2015-10-16 MED ORDER — TRANEXAMIC ACID 1000 MG/10ML IV SOLN
1000.0000 mg | Freq: Once | INTRAVENOUS | Status: AC
  Administered 2015-10-16: 1000 mg via INTRAVENOUS
  Filled 2015-10-16: qty 10

## 2015-10-16 MED ORDER — MAGNESIUM CITRATE PO SOLN: 1.0000 | Freq: Once | ORAL | Status: DC | PRN

## 2015-10-16 MED ORDER — DOCUSATE SODIUM 100 MG PO CAPS: 100.0000 mg | ORAL_CAPSULE | Freq: Two times a day (BID) | ORAL | Status: DC

## 2015-10-16 MED ORDER — PROPOFOL 10 MG/ML IV BOLUS
INTRAVENOUS | Status: AC
  Filled 2015-10-16: qty 60

## 2015-10-16 MED ORDER — SODIUM CHLORIDE 0.9 % IV SOLN
100.0000 mL/h | INTRAVENOUS | Status: DC
  Administered 2015-10-16: 100 mL/h via INTRAVENOUS
  Filled 2015-10-16 (×4): qty 1000

## 2015-10-16 MED ORDER — ONDANSETRON HCL 4 MG PO TABS: 4.0000 mg | ORAL_TABLET | Freq: Four times a day (QID) | ORAL | Status: DC | PRN

## 2015-10-16 MED ORDER — ASPIRIN EC 325 MG PO TBEC
325.0000 mg | DELAYED_RELEASE_TABLET | Freq: Two times a day (BID) | ORAL | Status: DC
  Administered 2015-10-17: 325 mg via ORAL
  Filled 2015-10-16 (×3): qty 1

## 2015-10-16 MED ORDER — CEFAZOLIN SODIUM-DEXTROSE 2-3 GM-% IV SOLR
INTRAVENOUS | Status: AC
  Filled 2015-10-16: qty 50

## 2015-10-16 MED ORDER — HYDROMORPHONE HCL 1 MG/ML IJ SOLN
0.5000 mg | INTRAMUSCULAR | Status: DC | PRN
  Administered 2015-10-16 (×2): 1 mg via INTRAVENOUS
  Filled 2015-10-16 (×2): qty 1

## 2015-10-16 MED ORDER — ONDANSETRON HCL 4 MG/2ML IJ SOLN
INTRAMUSCULAR | Status: AC
  Filled 2015-10-16: qty 2

## 2015-10-16 MED ORDER — POLYETHYLENE GLYCOL 3350 17 G PO PACK
17.0000 g | PACK | Freq: Two times a day (BID) | ORAL | Status: DC
  Administered 2015-10-17: 17 g via ORAL

## 2015-10-16 MED ORDER — FENTANYL CITRATE (PF) 100 MCG/2ML IJ SOLN
INTRAMUSCULAR | Status: DC | PRN
  Administered 2015-10-16 (×2): 50 ug via INTRAVENOUS

## 2015-10-16 MED ORDER — LACTATED RINGERS IV SOLN
INTRAVENOUS | Status: DC | PRN
  Administered 2015-10-16: 07:00:00 via INTRAVENOUS

## 2015-10-16 MED ORDER — ONDANSETRON HCL 4 MG/2ML IJ SOLN
4.0000 mg | Freq: Four times a day (QID) | INTRAMUSCULAR | Status: DC | PRN
  Administered 2015-10-16 – 2015-10-17 (×2): 4 mg via INTRAVENOUS
  Filled 2015-10-16 (×2): qty 2

## 2015-10-16 MED ORDER — PHENOL 1.4 % MT LIQD
1.0000 | OROMUCOSAL | Status: DC | PRN
Start: 2015-10-16 — End: 2015-10-17

## 2015-10-16 MED ORDER — PROMETHAZINE HCL 25 MG/ML IJ SOLN: 6.2500 mg | INTRAMUSCULAR | Status: DC | PRN

## 2015-10-16 MED ORDER — FENTANYL CITRATE (PF) 100 MCG/2ML IJ SOLN: 25.0000 ug | INTRAMUSCULAR | Status: DC | PRN

## 2015-10-16 MED ORDER — EPHEDRINE SULFATE 50 MG/ML IJ SOLN
INTRAMUSCULAR | Status: DC | PRN
  Administered 2015-10-16 (×2): 5 mg via INTRAVENOUS

## 2015-10-16 MED ORDER — PROPOFOL 10 MG/ML IV BOLUS
INTRAVENOUS | Status: AC
  Filled 2015-10-16: qty 20

## 2015-10-16 MED ORDER — MENTHOL 3 MG MT LOZG: 1.0000 | LOZENGE | OROMUCOSAL | Status: DC | PRN

## 2015-10-16 MED ORDER — ONDANSETRON HCL 4 MG/2ML IJ SOLN
INTRAMUSCULAR | Status: DC | PRN
  Administered 2015-10-16: 4 mg via INTRAVENOUS

## 2015-10-16 MED ORDER — DIPHENHYDRAMINE HCL 25 MG PO CAPS: 25.0000 mg | ORAL_CAPSULE | Freq: Four times a day (QID) | ORAL | Status: DC | PRN

## 2015-10-16 MED ORDER — ASPIRIN 325 MG PO TBEC: 325.0000 mg | DELAYED_RELEASE_TABLET | Freq: Two times a day (BID) | ORAL | Status: AC

## 2015-10-16 MED ORDER — DEXAMETHASONE SODIUM PHOSPHATE 10 MG/ML IJ SOLN
INTRAMUSCULAR | Status: AC
  Filled 2015-10-16: qty 1

## 2015-10-16 MED ORDER — METOCLOPRAMIDE HCL 10 MG PO TABS: 5.0000 mg | ORAL_TABLET | Freq: Three times a day (TID) | ORAL | Status: DC | PRN

## 2015-10-16 MED ORDER — CHLORHEXIDINE GLUCONATE 4 % EX LIQD: 60.0000 mL | Freq: Once | CUTANEOUS | Status: DC

## 2015-10-16 MED ORDER — IRBESARTAN-HYDROCHLOROTHIAZIDE 300-12.5 MG PO TABS: 1.0000 | ORAL_TABLET | Freq: Every day | ORAL | Status: DC

## 2015-10-16 MED ORDER — DOCUSATE SODIUM 100 MG PO CAPS
100.0000 mg | ORAL_CAPSULE | Freq: Two times a day (BID) | ORAL | Status: DC
  Administered 2015-10-16 – 2015-10-17 (×2): 100 mg via ORAL

## 2015-10-16 MED ORDER — HYDROCODONE-ACETAMINOPHEN 7.5-325 MG PO TABS
1.0000 | ORAL_TABLET | ORAL | Status: DC
  Administered 2015-10-16 – 2015-10-17 (×5): 2 via ORAL
  Filled 2015-10-16 (×5): qty 2

## 2015-10-16 MED ORDER — ALUM & MAG HYDROXIDE-SIMETH 200-200-20 MG/5ML PO SUSP
30.0000 mL | ORAL | Status: DC | PRN
  Administered 2015-10-17: 30 mL via ORAL
  Filled 2015-10-16: qty 30

## 2015-10-16 MED ORDER — SODIUM CHLORIDE 0.9 % IR SOLN
Status: DC | PRN
  Administered 2015-10-16: 1000 mL

## 2015-10-16 MED ORDER — FENTANYL CITRATE (PF) 100 MCG/2ML IJ SOLN
INTRAMUSCULAR | Status: AC
Start: 2015-10-16 — End: 2015-10-16
  Filled 2015-10-16: qty 2

## 2015-10-16 MED ORDER — METOPROLOL SUCCINATE ER 100 MG PO TB24
100.0000 mg | ORAL_TABLET | Freq: Every day | ORAL | Status: DC
  Administered 2015-10-16: 100 mg via ORAL
  Filled 2015-10-16 (×2): qty 1

## 2015-10-16 MED ORDER — PROPOFOL 500 MG/50ML IV EMUL
INTRAVENOUS | Status: DC | PRN
  Administered 2015-10-16: 100 ug/kg/min via INTRAVENOUS

## 2015-10-16 MED ORDER — MEPERIDINE HCL 50 MG/ML IJ SOLN: 6.2500 mg | INTRAMUSCULAR | Status: DC | PRN

## 2015-10-16 MED ORDER — BISACODYL 10 MG RE SUPP: 10.0000 mg | Freq: Every day | RECTAL | Status: DC | PRN

## 2015-10-16 MED ORDER — METHOCARBAMOL 1000 MG/10ML IJ SOLN
500.0000 mg | Freq: Four times a day (QID) | INTRAVENOUS | Status: DC | PRN
  Administered 2015-10-16: 500 mg via INTRAVENOUS
  Filled 2015-10-16 (×2): qty 5

## 2015-10-16 MED ORDER — PROPOFOL 10 MG/ML IV BOLUS
INTRAVENOUS | Status: DC | PRN
  Administered 2015-10-16: 60 mg via INTRAVENOUS
  Administered 2015-10-16: 20 mg via INTRAVENOUS

## 2015-10-16 MED ORDER — CELECOXIB 200 MG PO CAPS
200.0000 mg | ORAL_CAPSULE | Freq: Two times a day (BID) | ORAL | Status: DC
  Administered 2015-10-16 – 2015-10-17 (×2): 200 mg via ORAL
  Filled 2015-10-16 (×3): qty 1

## 2015-10-16 MED ORDER — BUPIVACAINE HCL (PF) 0.5 % IJ SOLN
INTRAMUSCULAR | Status: DC | PRN
  Administered 2015-10-16: 3 mL

## 2015-10-16 MED ORDER — FERROUS SULFATE 325 (65 FE) MG PO TABS
325.0000 mg | ORAL_TABLET | Freq: Three times a day (TID) | ORAL | Status: DC
  Administered 2015-10-16 – 2015-10-17 (×3): 325 mg via ORAL
  Filled 2015-10-16 (×6): qty 1

## 2015-10-16 MED ORDER — CEFAZOLIN SODIUM-DEXTROSE 2-3 GM-% IV SOLR
2.0000 g | INTRAVENOUS | Status: AC
  Administered 2015-10-16: 2 g via INTRAVENOUS

## 2015-10-16 MED ORDER — CEFAZOLIN SODIUM-DEXTROSE 2-3 GM-% IV SOLR
2.0000 g | Freq: Four times a day (QID) | INTRAVENOUS | Status: AC
  Administered 2015-10-16 (×2): 2 g via INTRAVENOUS
  Filled 2015-10-16 (×2): qty 50

## 2015-10-16 MED ORDER — DEXAMETHASONE SODIUM PHOSPHATE 10 MG/ML IJ SOLN
10.0000 mg | Freq: Once | INTRAMUSCULAR | Status: AC
  Administered 2015-10-16: 10 mg via INTRAVENOUS

## 2015-10-16 MED ORDER — FERROUS SULFATE 325 (65 FE) MG PO TABS: 325.0000 mg | ORAL_TABLET | Freq: Three times a day (TID) | ORAL | Status: DC

## 2015-10-16 MED ORDER — POLYETHYLENE GLYCOL 3350 17 G PO PACK: 17.0000 g | PACK | Freq: Two times a day (BID) | ORAL | Status: DC

## 2015-10-16 MED ORDER — IRBESARTAN 300 MG PO TABS
300.0000 mg | ORAL_TABLET | Freq: Every day | ORAL | Status: DC
  Administered 2015-10-16 – 2015-10-17 (×2): 300 mg via ORAL
  Filled 2015-10-16 (×2): qty 1

## 2015-10-16 MED ORDER — HYDROCODONE-ACETAMINOPHEN 7.5-325 MG PO TABS: 1.0000 | ORAL_TABLET | ORAL | Status: DC | PRN

## 2015-10-16 MED ORDER — HYDROCHLOROTHIAZIDE 12.5 MG PO CAPS
12.5000 mg | ORAL_CAPSULE | Freq: Every day | ORAL | Status: DC
  Administered 2015-10-16 – 2015-10-17 (×2): 12.5 mg via ORAL
  Filled 2015-10-16 (×2): qty 1

## 2015-10-16 MED ORDER — METOCLOPRAMIDE HCL 5 MG/ML IJ SOLN
5.0000 mg | Freq: Three times a day (TID) | INTRAMUSCULAR | Status: DC | PRN
  Administered 2015-10-17: 10 mg via INTRAVENOUS
  Filled 2015-10-16: qty 2

## 2015-10-16 MED ORDER — CALCIUM CARBONATE ANTACID 500 MG PO CHEW: 1.0000 | CHEWABLE_TABLET | ORAL | Status: DC | PRN

## 2015-10-16 MED ORDER — LACTATED RINGERS IV SOLN
INTRAVENOUS | Status: DC
  Administered 2015-10-16: 1000 mL via INTRAVENOUS

## 2015-10-16 MED ORDER — METHOCARBAMOL 500 MG PO TABS: 500.0000 mg | ORAL_TABLET | Freq: Four times a day (QID) | ORAL | Status: DC | PRN

## 2015-10-16 MED ORDER — BUPIVACAINE HCL (PF) 0.5 % IJ SOLN
INTRAMUSCULAR | Status: AC
  Filled 2015-10-16: qty 30

## 2015-10-16 MED ORDER — DEXAMETHASONE SODIUM PHOSPHATE 10 MG/ML IJ SOLN
10.0000 mg | Freq: Once | INTRAMUSCULAR | Status: AC
  Administered 2015-10-17: 10 mg via INTRAVENOUS
  Filled 2015-10-16: qty 1

## 2015-10-16 SURGICAL SUPPLY — 35 items
BAG DECANTER FOR FLEXI CONT (MISCELLANEOUS) IMPLANT
BAG SPEC THK2 15X12 ZIP CLS (MISCELLANEOUS)
BAG ZIPLOCK 12X15 (MISCELLANEOUS) IMPLANT
CAPT HIP TOTAL 2 ×3 IMPLANT
CLOTH BEACON ORANGE TIMEOUT ST (SAFETY) ×3 IMPLANT
COVER PERINEAL POST (MISCELLANEOUS) ×3 IMPLANT
DRAPE STERI IOBAN 125X83 (DRAPES) ×3 IMPLANT
DRAPE U-SHAPE 47X51 STRL (DRAPES) ×6 IMPLANT
DRSG AQUACEL AG ADV 3.5X10 (GAUZE/BANDAGES/DRESSINGS) ×3 IMPLANT
DURAPREP 26ML APPLICATOR (WOUND CARE) ×3 IMPLANT
ELECT REM PT RETURN 15FT ADLT (MISCELLANEOUS) IMPLANT
ELECT REM PT RETURN 9FT ADLT (ELECTROSURGICAL) ×3
ELECTRODE REM PT RTRN 9FT ADLT (ELECTROSURGICAL) ×1 IMPLANT
GLOVE BIOGEL M 7.0 STRL (GLOVE) IMPLANT
GLOVE BIOGEL PI IND STRL 7.5 (GLOVE) ×1 IMPLANT
GLOVE BIOGEL PI IND STRL 8.5 (GLOVE) ×1 IMPLANT
GLOVE BIOGEL PI INDICATOR 7.5 (GLOVE) ×2
GLOVE BIOGEL PI INDICATOR 8.5 (GLOVE) ×2
GLOVE ECLIPSE 8.0 STRL XLNG CF (GLOVE) ×6 IMPLANT
GLOVE ORTHO TXT STRL SZ7.5 (GLOVE) ×3 IMPLANT
GOWN STRL REUS W/TWL LRG LVL3 (GOWN DISPOSABLE) ×3 IMPLANT
GOWN STRL REUS W/TWL XL LVL3 (GOWN DISPOSABLE) ×3 IMPLANT
HOLDER FOLEY CATH W/STRAP (MISCELLANEOUS) ×3 IMPLANT
LIQUID BAND (GAUZE/BANDAGES/DRESSINGS) ×3 IMPLANT
PACK ANTERIOR HIP CUSTOM (KITS) ×3 IMPLANT
SAW OSC TIP CART 19.5X105X1.3 (SAW) ×3 IMPLANT
SUT MNCRL AB 4-0 PS2 18 (SUTURE) ×3 IMPLANT
SUT VIC AB 1 CT1 36 (SUTURE) ×9 IMPLANT
SUT VIC AB 2-0 CT1 27 (SUTURE) ×6
SUT VIC AB 2-0 CT1 TAPERPNT 27 (SUTURE) ×2 IMPLANT
SUT VLOC 180 0 24IN GS25 (SUTURE) ×3 IMPLANT
TRAY FOLEY W/METER SILVER 14FR (SET/KITS/TRAYS/PACK) IMPLANT
TRAY FOLEY W/METER SILVER 16FR (SET/KITS/TRAYS/PACK) ×2 IMPLANT
WATER STERILE IRR 1500ML POUR (IV SOLUTION) ×3 IMPLANT
YANKAUER SUCT BULB TIP 10FT TU (MISCELLANEOUS) IMPLANT

## 2015-10-16 NOTE — Discharge Instructions (Signed)

## 2015-10-16 NOTE — Progress Notes (Signed)
Utilization review completed.  

## 2015-10-16 NOTE — Interval H&P Note (Signed)
History and Physical Interval Note:  10/16/2015 7:15 AM  Tyler David  has presented today for surgery, with the diagnosis of left hip osteoarthritis  The various methods of treatment have been discussed with the patient and family. After consideration of risks, benefits and other options for treatment, the patient has consented to  Procedure(s): LEFT TOTAL HIP ARTHROPLASTY ANTERIOR APPROACH (Left) as a surgical intervention .  The patient's history has been reviewed, patient examined, no change in status, stable for surgery.  I have reviewed the patient's chart and labs.  Questions were answered to the patient's satisfaction.     Mauri Pole

## 2015-10-16 NOTE — Anesthesia Procedure Notes (Signed)
Spinal Patient location during procedure: OR Start time: 10/16/2015 7:24 AM End time: 10/16/2015 7:34 AM Reason for block: at surgeon's request Staffing Anesthesiologist: Montez Hageman Resident/CRNA: Anne Fu Performed by: resident/CRNA and anesthesiologist  Preanesthetic Checklist Completed: patient identified, site marked, surgical consent, pre-op evaluation, timeout performed, IV checked, risks and benefits discussed, monitors and equipment checked and at surgeon's request Spinal Block Patient position: sitting Approach: right paramedian Location: L2-3 Injection technique: single-shot Needle Needle type: Spinocan  Needle gauge: 22 G Needle length: 9 cm Assessment Sensory level: T6 Additional Notes Expiration date of kit checked and confirmed. Patient tolerated procedure well, without complications.  X 1 right paramedian without success converted to midline without success, MD [place] X 1 right paramedian noted clear CSF return. Loss of motor and sensory on exam post injection

## 2015-10-16 NOTE — Evaluation (Signed)
Physical Therapy Evaluation Patient Details Name: Tyler David MRN: [id number] DOB: [birth year] Today's Date: 10/16/2015   History of Present Illness  L THR`  Clinical Impression  Pt s/p L THR presents with decreased L LE strength/ROM and post op pain limiting functional mobility.  Pt should progress to dc home with family assist and HHPT follow up.    Follow Up Recommendations Home health PT    Equipment Recommendations  Rolling walker with 5" wheels    Recommendations for Other Services OT consult     Precautions / Restrictions Precautions Precautions: Fall Restrictions Weight Bearing Restrictions: No Other Position/Activity Restrictions: WBAT      Mobility  Bed Mobility Overal bed mobility: Needs Assistance Bed Mobility: Supine to Sit     Supine to sit: Min assist;Mod assist     General bed mobility comments: cues for sequence and use of R LE to self assist  Transfers Overall transfer level: Needs assistance Equipment used: Rolling walker (2 wheeled) Transfers: Sit to/from Stand Sit to Stand: Min assist;Mod assist         General transfer comment: cues for LE management and use of UEs to self assist  Ambulation/Gait Ambulation/Gait assistance: Min assist;Mod assist Ambulation Distance (Feet): 60 Feet Assistive device: Rolling walker (2 wheeled) Gait Pattern/deviations: Step-to pattern;Decreased step length - right;Decreased step length - left;Shuffle;Trunk flexed Gait velocity: decreased   General Gait Details: cues for sequence, posture and position from [employer]            Wheelchair Mobility    Modified Rankin (Stroke Patients Only)       Balance                                             Pertinent Vitals/Pain Pain Assessment: 0-10 Pain Score: 4  Pain Location: L hip/thigh Pain Descriptors / Indicators: Aching;Sore Pain Intervention(s): Limited activity within patient's tolerance;Monitored during  session;Premedicated before session;Ice applied    Home Living Family/patient expects to be discharged to:: Private residence Living Arrangements: Spouse/significant other Available Help at Discharge: Family Type of Home: House Home Access: Stairs to enter Entrance Stairs-Rails: None Technical brewer of Steps: 3 Home Layout: One level Home Equipment: Cane - single point      Prior Function Level of Independence: Independent with assistive device(s)               Hand Dominance        Extremity/Trunk Assessment   Upper Extremity Assessment: Overall WFL for tasks assessed           Lower Extremity Assessment: LLE deficits/detail      Cervical / Trunk Assessment: Normal  Communication   Communication: No difficulties  Cognition Arousal/Alertness: Awake/alert Behavior During Therapy: WFL for tasks assessed/performed Overall Cognitive Status: Within Functional Limits for tasks assessed                      General Comments      Exercises Total Joint Exercises Ankle Circles/Pumps: AROM;Both;15 reps;Supine      Assessment/Plan    PT Assessment Patient needs continued PT services  PT Diagnosis Difficulty walking   PT Problem List Decreased strength;Decreased range of motion;Decreased activity tolerance;Decreased mobility;Decreased knowledge of use of DME;Pain  PT Treatment Interventions DME instruction;Gait training;Stair training;Functional mobility training;Therapeutic activities;Therapeutic exercise;Patient/family education   PT Goals (Current goals can be found  in the Care Plan section) Acute Rehab PT Goals Patient Stated Goal: Regain IND PT Goal Formulation: With patient Time For Goal Achievement: 10/19/15 Potential to Achieve Goals: Good    Frequency 7X/week   Barriers to discharge        Co-evaluation               End of Session Equipment Utilized During Treatment: Gait belt Activity Tolerance: Patient tolerated  treatment well Patient left: in chair;with call bell/phone within reach;with family/visitor present Nurse Communication: Mobility status         Time: [id number] PT Time Calculation (min) (ACUTE ONLY): 26 min   Charges:   PT Evaluation $Initial PT Evaluation Tier I: 1 Procedure PT Treatments $Gait Training: 8-22 mins   PT G Codes:        Kambre Messner 11/11/15, 5:57 PM

## 2015-10-16 NOTE — Anesthesia Preprocedure Evaluation (Addendum)
Anesthesia Evaluation  Patient identified by MRN, date of birth, ID band Patient awake    Reviewed: Allergy & Precautions, NPO status , Patient's Chart, lab work & pertinent test results  Airway Mallampati: II  TM Distance: >3 FB Neck ROM: Full    Dental no notable dental hx.    Pulmonary neg pulmonary ROS,    Pulmonary exam normal breath sounds clear to auscultation       Cardiovascular hypertension, Pt. on medications negative cardio ROS Normal cardiovascular exam Rhythm:Regular Rate:Normal     Neuro/Psych negative neurological ROS  negative psych ROS   GI/Hepatic negative GI ROS, Neg liver ROS,   Endo/Other  negative endocrine ROS  Renal/GU negative Renal ROS  negative genitourinary   Musculoskeletal negative musculoskeletal ROS (+)   Abdominal   Peds negative pediatric ROS (+)  Hematology negative hematology ROS (+)   Anesthesia Other Findings   Reproductive/Obstetrics negative OB ROS                             Anesthesia Physical Anesthesia Plan  ASA: II  Anesthesia Plan: Spinal   Post-op Pain Management:    Induction:   Airway Management Planned: Simple Face Mask  Additional Equipment:   Intra-op Plan:   Post-operative Plan:   Informed Consent: I have reviewed the patients History and Physical, chart, labs and discussed the procedure including the risks, benefits and alternatives for the proposed anesthesia with the patient or authorized representative who has indicated his/her understanding and acceptance.   Dental advisory given  Plan Discussed with: CRNA  Anesthesia Plan Comments:         Anesthesia Quick Evaluation  

## 2015-10-16 NOTE — Op Note (Signed)
NAME:  Tyler David                ACCOUNT NO.: [id number]      MEDICAL RECORD NO.: [id number]      FACILITY:  [facility]      PHYSICIAN:  Paralee Cancel D  DATE OF BIRTH:  [birth year]     DATE OF PROCEDURE:  10/16/2015                                 OPERATIVE REPORT         PREOPERATIVE DIAGNOSIS: Left  hip osteoarthritis.      POSTOPERATIVE DIAGNOSIS:  Left hip osteoarthritis.      PROCEDURE:  Left total hip replacement through an anterior approach   utilizing DePuy THR system, component size 73mm pinnacle cup, a size 36+4 neutral   Altrex liner, a size 5 Hi Tri Lock stem with a 36+5 delta ceramic   ball.      SURGEON:  Pietro Cassis. Alvan Dame, M.D.      ASSISTANT:  Danae Orleans, PA-C      ANESTHESIA:  Spinal.      SPECIMENS:  None.      COMPLICATIONS:  None.      BLOOD LOSS:  100 cc     DRAINS:  None.      INDICATION OF THE PROCEDURE:  Tyler David is a 69 y.o. male who had   presented to office for evaluation of left hip pain.  Radiographs revealed   progressive degenerative changes with bone-on-bone   articulation to the  hip joint.  The patient had painful limited range of   motion significantly affecting their overall quality of life.  The patient was failing to    respond to conservative measures, and at this point was ready   to proceed with more definitive measures.  The patient has noted progressive   degenerative changes in his hip, progressive problems and dysfunction   with regarding the hip prior to surgery.  Consent was obtained for   benefit of pain relief.  Specific risk of infection, DVT, component   failure, dislocation, need for revision surgery, as well discussion of   the anterior versus posterior approach were reviewed.  Consent was   obtained for benefit of anterior pain relief through an anterior   approach.      PROCEDURE IN DETAIL:  The patient was brought to operative theater.   Once adequate anesthesia,  preoperative antibiotics, 2gm of Ancef, 1 gm of Tranexamic Acid, and 10 mg of Decadron administered.   The patient was positioned supine on the OSI Hanna table.  Once adequate   padding of boney process was carried out, we had predraped out the hip, and  used fluoroscopy to confirm orientation of the pelvis and position.      The left hip was then prepped and draped from proximal iliac crest to   mid thigh with shower curtain technique.      Time-out was performed identifying the patient, planned procedure, and   extremity.     An incision was then made 2 cm distal and lateral to the   anterior superior iliac spine extending over the orientation of the   tensor fascia lata muscle and sharp dissection was carried down to the   fascia of the muscle and protractor placed in the soft tissues.      The fascia  was then incised.  The muscle belly was identified and swept   laterally and retractor placed along the superior neck.  Following   cauterization of the circumflex vessels and removing some pericapsular   fat, a second cobra retractor was placed on the inferior neck.  A third   retractor was placed on the anterior acetabulum after elevating the   anterior rectus.  A L-capsulotomy was along the line of the   superior neck to the trochanteric fossa, then extended proximally and   distally.  Tag sutures were placed and the retractors were then placed   intracapsular.  We then identified the trochanteric fossa and   orientation of my neck cut, confirmed this radiographically   and then made a neck osteotomy with the femur on traction.  The femoral   head was removed without difficulty or complication.  Traction was let   off and retractors were placed posterior and anterior around the   acetabulum.      The labrum and foveal tissue were debrided.  I began reaming with a 50mm   reamer and reamed up to 78mm reamer with good bony bed preparation and a 55mm   cup was chosen.  The final 61mm  Pinnacle cup was then impacted under fluoroscopy  to confirm the depth of penetration and orientation with respect to   abduction.  A screw was placed followed by the hole eliminator.  The final   36+4 neutral Altrex liner was impacted with good visualized rim fit.  The cup was positioned anatomically within the acetabular portion of the pelvis.      At this point, the femur was rolled at 80 degrees.  Further capsule was   released off the inferior aspect of the femoral neck.  I then   released the superior capsule proximally.  The hook was placed laterally   along the femur and elevated manually and held in position with the bed   hook.  The leg was then extended and adducted with the leg rolled to 100   degrees of external rotation.  Once the proximal femur was fully   exposed, I used a box osteotome to set orientation.  I then began   broaching with the starting chili pepper broach and passed this by hand and then broached up to 5.  With the 5 broach in place I chose a high offset neck and did trial reductions.  The offset was appropriate, leg lengths   appeared to be equal best matched with the +5 head ball, confirmed radiographically.   Given these findings, I went ahead and dislocated the hip, repositioned all   retractors and positioned the right hip in the extended and abducted position.  The final 5 Hi Tri Lock stem was   chosen and it was impacted down to the level of neck cut.  Based on this   and the trial reduction, a 36+5 delta ceramic ball was chosen and   impacted onto a clean and dry trunnion, and the hip was reduced.  The   hip had been irrigated throughout the case again at this point.  I did   reapproximate the superior capsular leaflet to the anterior leaflet   using #1 Vicryl.  The fascia of the   tensor fascia lata muscle was then reapproximated using #1 Vicryl and #0 Quill sutures.  The   remaining wound was closed with 2-0 Vicryl and running 4-0 Monocryl.   The hip  was cleaned, dried, and  dressed sterilely using Dermabond and   Aquacel dressing.  He was then brought   to recovery room in stable condition tolerating the procedure well.    Danae Orleans, PA-C was present for the entirety of the case involved from   preoperative positioning, perioperative retractor management, general   facilitation of the case, as well as primary wound closure as assistant.            Pietro Cassis Alvan Dame, M.D.        10/16/2015 8:41 AM

## 2015-10-16 NOTE — Transfer of Care (Signed)
Immediate Anesthesia Transfer of Care Note  Patient: Tyler David  Procedure(s) Performed: Procedure(s): LEFT TOTAL HIP ARTHROPLASTY ANTERIOR APPROACH (Left)  Patient Location: PACU  Anesthesia Type:Spinal  Level of Consciousness:  sedated, patient cooperative and responds to stimulation  Airway & Oxygen Therapy:Patient Spontanous Breathing and Patient connected to face mask oxgen  Post-op Assessment:  Report given to PACU RN and Post -op Vital signs reviewed and stable  Post vital signs:  Reviewed and stable  Last Vitals:  Filed Vitals:   10/16/15 0516  BP: 188/75  Pulse: 59  Temp: 36.7 C  Resp: 18    Complications: No apparent anesthesia complications

## 2015-10-17 LAB — CBC
HCT: 37 % — ABNORMAL LOW (ref 39.0–52.0)
Hemoglobin: 12.6 g/dL — ABNORMAL LOW (ref 13.0–17.0)
MCH: 30.6 pg (ref 26.0–34.0)
MCHC: 34.1 g/dL (ref 30.0–36.0)
MCV: 89.8 fL (ref 78.0–100.0)
Platelets: 200 10*3/uL (ref 150–400)
RBC: 4.12 MIL/uL — ABNORMAL LOW (ref 4.22–5.81)
RDW: 12.5 % (ref 11.5–15.5)
WBC: 13.5 10*3/uL — ABNORMAL HIGH (ref 4.0–10.5)

## 2015-10-17 LAB — BASIC METABOLIC PANEL
Anion gap: 8 (ref 5–15)
BUN: 30 mg/dL — ABNORMAL HIGH (ref 6–20)
CALCIUM: 9.5 mg/dL (ref 8.9–10.3)
CO2: 30 mmol/L (ref 22–32)
CREATININE: 1.57 mg/dL — AB (ref 0.61–1.24)
Chloride: 99 mmol/L — ABNORMAL LOW (ref 101–111)
GFR calc non Af Amer: 43 mL/min — ABNORMAL LOW (ref >60)
GFR, EST AFRICAN AMERICAN: 50 mL/min — AB (ref >60)
Glucose, Bld: 145 mg/dL — ABNORMAL HIGH (ref 65–99)
Potassium: 3.9 mmol/L (ref 3.5–5.1)
SODIUM: 137 mmol/L (ref 135–145)

## 2015-10-17 MED ORDER — ONDANSETRON HCL 4 MG PO TABS: 4.0000 mg | ORAL_TABLET | Freq: Four times a day (QID) | ORAL | Status: DC | PRN

## 2015-10-17 NOTE — Care Management Note (Signed)
Case Management Note  Patient Details  Name: Tyler David MRN: [id number] Date of Birth: March 21, 1946  Subjective/Objective:    LEFT TOTAL HIP ARTHROPLASTY                 Action/Plan: NCM spoke to pt and wife. Pt feels he will be ok with out PT. Explained [place] [facility] recommended. D/c summary requesting HHPT post dc. Offered choice for [facility]. Pt agreeable to [facility] for [facility]. Requesting RW for home. NCM contacted [place] for RW for home.   Expected Discharge Date:  10/17/2015           Expected Discharge Plan:  [place]  In-House Referral:     Discharge planning Services  CM Consult  Post Acute Care Choice:  Home Health Choice offered to:  Patient  DME Arranged:  Walker rolling DME Agency:  [place]:  PT [facility] Agency:  [place]  Status of Service:  Completed, signed off  Medicare Important Message Given:    Date Medicare IM Given:    Medicare IM give by:    Date Additional Medicare IM Given:    Additional Medicare Important Message give by:     If discussed at [place] of Stay Meetings, dates discussed:    Additional Comments:  Erenest Rasher, RN 10/17/2015, 11:17 AM

## 2015-10-17 NOTE — Progress Notes (Signed)
Physical Therapy Treatment Patient Details Name: Tyler David MRN: [id number] DOB: [birth year] Today's Date: 10/17/2015    History of Present Illness L THR`    PT Comments    Pt progressing well and eager for dc home.  Reviewed therex and progression, stairs and car transfers with pt and spouse.  Follow Up Recommendations  Home health PT     Equipment Recommendations  Rolling walker with 5" wheels    Recommendations for Other Services OT consult     Precautions / Restrictions Precautions Precautions: Fall Restrictions Weight Bearing Restrictions: No Other Position/Activity Restrictions: WBAT    Mobility  Bed Mobility Overal bed mobility: Needs Assistance Bed Mobility: Supine to Sit;Sit to Supine     Supine to sit: Supervision Sit to supine: Supervision   General bed mobility comments: cues for sequence and use of R LE to self assist  Transfers Overall transfer level: Needs assistance Equipment used: Rolling walker (2 wheeled) Transfers: Sit to/from Stand Sit to Stand: Min guard;Supervision Stand pivot transfers: Min guard       General transfer comment: cues for LE management and use of UEs to self assist  Ambulation/Gait Ambulation/Gait assistance: Min guard;Supervision Ambulation Distance (Feet): 350 Feet Assistive device: Rolling walker (2 wheeled) Gait Pattern/deviations: Step-to pattern;Step-through pattern;Decreased step length - right;Decreased step length - left;Shuffle;Trunk flexed     General Gait Details: cues for posture, position from RW, saftey awareness and initial sequence   Stairs Stairs: Yes Stairs assistance: Min assist Stair Management: No rails;Step to pattern;Backwards;With walker Number of Stairs: 4 General stair comments: cues for sequence and foot/RW placement.  Pt's spouse assisting and written instruction provided  Wheelchair Mobility    Modified Rankin (Stroke Patients Only)       Balance                                    Cognition Arousal/Alertness: Awake/alert Behavior During Therapy: WFL for tasks assessed/performed Overall Cognitive Status: Within Functional Limits for tasks assessed                      Exercises Total Joint Exercises Ankle Circles/Pumps: AROM;Both;15 reps;Supine Quad Sets: AROM;Both;10 reps;Supine Heel Slides: AAROM;20 reps;Left;Supine Hip ABduction/ADduction: AAROM;Left;20 reps;Supine Long Arc Quad: AAROM;AROM;Left;15 reps;Seated    General Comments General comments (skin integrity, edema, etc.): wife supportive.  Pt eager to discharge.  All education completed.       Pertinent Vitals/Pain Pain Assessment: 0-10 Pain Score: 3  Pain Location: L hip/thigh Pain Descriptors / Indicators: Aching;Sore Pain Intervention(s): Limited activity within patient's tolerance;Monitored during session;Premedicated before session;Ice applied    Home Living Family/patient expects to be discharged to:: Private residence Living Arrangements: Spouse/significant other Available Help at Discharge: Family Type of Home: House Home Access: Stairs to enter Entrance Stairs-Rails: None Home Layout: One level Home Equipment: Cane - single point;Grab bars - tub/shower      Prior Function Level of Independence: Independent with assistive device(s)          PT Goals (current goals can now be found in the care plan section) Acute Rehab PT Goals Patient Stated Goal: to go home  PT Goal Formulation: With patient Time For Goal Achievement: 10/19/15 Potential to Achieve Goals: Good Progress towards PT goals: Progressing toward goals    Frequency  7X/week    PT Plan Current plan remains appropriate    Co-evaluation  End of Session Equipment Utilized During Treatment: Gait belt Activity Tolerance: Patient tolerated treatment well Patient left: in bed     Time: 1004-1053 PT Time Calculation (min) (ACUTE ONLY): 49 min  Charges:  $Gait  Training: 8-22 mins $Therapeutic Exercise: 8-22 mins $Therapeutic Activity: 8-22 mins                    G Codes:      Tyler David 2015-10-30, 1:12 PM

## 2015-10-17 NOTE — Evaluation (Addendum)
Occupational Therapy Evaluation Patient Details Name: Tyler David MRN: [id number] DOB: 03/04/1946 Today's Date: 10/17/2015    History of Present Illness L THR`   Clinical Impression   Patient evaluated by Occupational Therapy with no further acute OT needs identified. All education has been completed and the patient has no further questions. All education completed.  See below for any follow-up Occupational Therapy or equipment needs. OT is signing off. Thank you for this referral.      Follow Up Recommendations  No OT follow up;Supervision/Assistance - 24 hour    Equipment Recommendations  None recommended by OT    Recommendations for Other Services       Precautions / Restrictions Precautions Precautions: Fall Restrictions Other Position/Activity Restrictions: WBAT      Mobility Bed Mobility Overal bed mobility: Needs Assistance Bed Mobility: Supine to Sit     Supine to sit: Supervision        Transfers Overall transfer level: Needs assistance Equipment used: Rolling walker (2 wheeled) Transfers: Sit to/from [facility] Sit to Stand: Min guard Stand pivot transfers: Min guard            Balance                                            ADL Overall ADL's : Needs assistance/impaired Eating/Feeding: Independent   Grooming: Wash/dry hands;Wash/dry face;Oral care;Brushing hair;Min guard;Standing   Upper Body Bathing: Set up;Sitting   Lower Body Bathing: Minimal assistance;Sit to/from stand   Upper Body Dressing : Set up;Sitting   Lower Body Dressing: Minimal assistance;Sit to/from stand   Toilet Transfer: Min guard;Ambulation;Comfort height toilet;RW   Toileting- Water quality scientist and Hygiene: Min guard;Sit to/from stand   Tub/ Shower Transfer: Minimal assistance;Rolling walker;Grab bars   Functional mobility during ADLs: Min guard;Rolling walker General ADL Comments: Pt requires min A to access  Lt foot during LB ADLs.  Wife will be available to assist at discharge.  Instructed them, he needs assist/supervision with tub tranfer     Vision     Perception     Praxis      Pertinent Vitals/Pain Pain Assessment: No/denies pain     Hand Dominance     Extremity/Trunk Assessment Upper Extremity Assessment Upper Extremity Assessment: Overall WFL for tasks assessed   Lower Extremity Assessment Lower Extremity Assessment: Defer to PT evaluation   Cervical / Trunk Assessment Cervical / Trunk Assessment: Normal   Communication Communication Communication: No difficulties   Cognition Arousal/Alertness: Awake/alert Behavior During Therapy: WFL for tasks assessed/performed Overall Cognitive Status: Within Functional Limits for tasks assessed                     General Comments       Exercises       Shoulder Instructions      Home Living Family/patient expects to be discharged to:: Private residence Living Arrangements: Spouse/significant other Available Help at Discharge: Family Type of Home: House Home Access: Stairs to enter Technical brewer of Steps: 3 Entrance Stairs-Rails: None Home Layout: One level     Bathroom Shower/Tub: Tub/shower unit;Curtain Shower/tub characteristics: Sales executive: Yes   Home Equipment: Cane - single point;Grab bars - tub/shower          Prior Functioning/Environment Level of Independence: Independent with assistive device(s)  OT Diagnosis: Generalized weakness;Acute pain   OT Problem List:     OT Treatment/Interventions:      OT Goals(Current goals can be found in the care plan section) Acute Rehab OT Goals Patient Stated Goal: to go home  OT Goal Formulation: All assessment and education complete, DC therapy  OT Frequency:     Barriers to D/C:            Co-evaluation              End of Session Equipment Utilized During Treatment: Rolling  walker  Activity Tolerance: Patient tolerated treatment well Patient left: in chair;with call bell/phone within reach;with family/visitor present   Time: [id number] OT Time Calculation (min): 41 min Charges:  OT General Charges $OT Visit: 1 Procedure OT Evaluation $Initial OT Evaluation Tier I: 1 Procedure OT Treatments $Self Care/Home Management : 8-22 mins G-Codes:    Leela Vanbrocklin M October 29, 2015, 12:53 PM

## 2015-10-17 NOTE — Progress Notes (Addendum)
Subjective: 1 Day Post-Op Procedure(s) (LRB): LEFT TOTAL HIP ARTHROPLASTY ANTERIOR APPROACH (Left) Patient reports pain as 4 on 0-10 scale.    Objective: Vital signs in last 24 hours: Temp:  [97.5 F (36.4 C)-98.2 F (36.8 C)] 97.7 F (36.5 C) (12/31 0401) Pulse Rate:  [43-68] 63 (12/31 0401) Resp:  [9-20] 19 (12/31 0401) BP: (134-184)/(49-107) 140/54 mmHg (12/31 0401) SpO2:  [96 %-100 %] 96 % (12/31 0401)  Intake/Output from previous day: 12/30 0701 - 12/31 0700 In: 3123.2 [P.O.:60; I.V.:2963.2; IV Piggyback:100] Out: 2975 [Urine:2875; Blood:100] Intake/Output this shift:     Recent Labs  10/17/15 0518  HGB 12.6*    Recent Labs  10/17/15 0518  WBC 13.5*  RBC 4.12*  HCT 37.0*  PLT 200    Recent Labs  10/17/15 0518  NA 137  K 3.9  CL 99*  CO2 30  BUN 30*  CREATININE 1.57*  GLUCOSE 145*  CALCIUM 9.5   No results for input(s): LABPT, INR in the last 72 hours.  Neurologically intact Neurovascular intact Intact pulses distally Compartment soft  Assessment/Plan: 1 Day Post-Op Procedure(s) (LRB): LEFT TOTAL HIP ARTHROPLASTY ANTERIOR APPROACH (Left) Advance diet D/C IV fluids Discharge home with home health after PT. Instructions given. Good UO Tyler David C 10/17/2015, 8:35 AM

## 2015-10-19 DIAGNOSIS — I10 Essential (primary) hypertension: Secondary | ICD-10-CM | POA: Diagnosis not present

## 2015-10-19 DIAGNOSIS — M25552 Pain in left hip: Secondary | ICD-10-CM | POA: Diagnosis not present

## 2015-10-19 DIAGNOSIS — Z96642 Presence of left artificial hip joint: Secondary | ICD-10-CM | POA: Diagnosis not present

## 2015-10-19 DIAGNOSIS — Z79891 Long term (current) use of opiate analgesic: Secondary | ICD-10-CM | POA: Diagnosis not present

## 2015-10-19 DIAGNOSIS — Z471 Aftercare following joint replacement surgery: Secondary | ICD-10-CM | POA: Diagnosis not present

## 2015-10-19 DIAGNOSIS — K219 Gastro-esophageal reflux disease without esophagitis: Secondary | ICD-10-CM | POA: Diagnosis not present

## 2015-10-21 NOTE — Discharge Summary (Signed)
Physician Discharge Summary  Patient ID: Tyler David MRN: [id number] DOB/AGE: 71-09-1946 71 y.o.  Admit date: 10/16/2015 Discharge date: 10/17/2015   Procedures:  Procedure(s) (LRB): LEFT TOTAL HIP ARTHROPLASTY ANTERIOR APPROACH (Left)  Attending Physician:  Dr. Paralee Cancel   Admission Diagnoses:   Left hip primary OA / pain  Discharge Diagnoses:  Principal Problem:   S/P left THA, AA  Past Medical History  Diagnosis Date  . Hypertension   . Pneumonia     hx of at age 51   . Nocturia   . GERD (gastroesophageal reflux disease)   . Headache     hx of migraine-2015   . Arthritis   . Overactive bladder   . Cancer [facility])     prostate cancer     HPI:    Tyler David, 70 y.o. male, has a history of pain and functional disability in the left hip(s) due to arthritis and patient has failed non-surgical conservative treatments for greater than 12 weeks to include NSAID's and/or analgesics and activity modification. Onset of symptoms was gradual starting 3-4 years ago with gradually worsening course since that time.The patient noted no past surgery on the left hip(s). Patient currently rates pain in the left hip at 8 out of 10 with activity. Patient has worsening of pain with activity and weight bearing, trendelenberg gait, pain that interfers with activities of daily living, pain with passive range of motion, crepitus and joint swelling. Patient has evidence of periarticular osteophytes and joint space narrowing by imaging studies. This condition presents safety issues increasing the risk of falls. There is no current active infection. Risks, benefits and expectations were discussed with the patient. Risks including but not limited to the risk of anesthesia, blood clots, nerve damage, blood vessel damage, failure of the prosthesis, infection and up to and including death. Patient understand the risks, benefits and expectations and wishes to proceed with surgery.   PCP:  Shirline Frees, MD   Discharged Condition: good  Hospital Course:  Patient underwent the above stated procedure on 10/16/2015. Patient tolerated the procedure well and brought to the recovery room in good condition and subsequently to the floor.  POD #1 BP: 140/54 ; Pulse: 63 ; Temp: 97.7 F (36.5 C) ; Resp: 19 Patient reports pain as 4 on 0-10 scale.  Neurologically intact, neurovascular intact, intact pulses distally and compartment soft  LABS  Basename    HGB     12.6  HCT     37.0    Discharge Exam: General appearance: alert, cooperative and no distress Extremities: Homans sign is negative, no sign of DVT, no edema, redness or tenderness in the calves or thighs and no ulcers, gangrene or trophic changes  Disposition: Home with follow up in 2 weeks   Follow-up Information    Follow up with Mauri Pole, MD. Schedule an appointment as soon as possible for a visit in 2 weeks.   Specialty:  Orthopedic Surgery   Contact information:   [address] [place] [address] [phone]       Follow up with [facility].   Why:  Home Health Physical Therapy   Contact information:   [place] [address] [phone]       Discharge Instructions    Call MD / Call 911    Complete by:  As directed   If you experience chest pain or shortness of breath, CALL 911 and be transported to the  hospital emergency room.  If you develope a fever above 101 F, pus (white drainage) or increased drainage or redness at the wound, or calf pain, call your surgeon's office.     Call MD / Call 911    Complete by:  As directed   If you experience chest pain or shortness of breath, CALL 911 and be transported to the hospital emergency room.  If you develope a fever above 101 F, pus (white drainage) or increased drainage or redness at the wound, or calf pain, call your surgeon's office.     Change dressing    Complete by:  As directed    Maintain surgical dressing until follow up in the clinic. If the edges start to pull up, may reinforce with tape. If the dressing is no longer working, may remove and cover with gauze and tape, but must keep the area dry and clean.  Call with any questions or concerns.     Constipation Prevention    Complete by:  As directed   Drink plenty of fluids.  Prune juice may be helpful.  You may use a stool softener, such as Colace (over the counter) 100 mg twice a day.  Use MiraLax (over the counter) for constipation as needed.     Constipation Prevention    Complete by:  As directed   Drink plenty of fluids.  Prune juice may be helpful.  You may use a stool softener, such as Colace (over the counter) 100 mg twice a day.  Use MiraLax (over the counter) for constipation as needed.     Diet - low sodium heart healthy    Complete by:  As directed      Diet - low sodium heart healthy    Complete by:  As directed      Discharge instructions    Complete by:  As directed   Maintain surgical dressing until follow up in the clinic. If the edges start to pull up, may reinforce with tape. If the dressing is no longer working, may remove and cover with gauze and tape, but must keep the area dry and clean.  Follow up in 2 weeks at [facility]. Call with any questions or concerns.     Increase activity slowly as tolerated    Complete by:  As directed   Weight bearing as tolerated with assist device (walker, cane, etc) as directed, use it as long as suggested by your surgeon or therapist, typically at least 4-6 weeks.     Increase activity slowly as tolerated    Complete by:  As directed      TED hose    Complete by:  As directed   Use stockings (TED hose) for 2 weeks on both leg(s).  You may remove them at night for sleeping.             Medication List    STOP taking these medications        acetaminophen 650 MG CR tablet  Commonly known as:  TYLENOL     ibuprofen 200 MG tablet  Commonly  known as:  ADVIL,MOTRIN      TAKE these medications        aspirin 325 MG EC tablet  Take 1 tablet (325 mg total) by mouth 2 (two) times daily.     AVALIDE 300-12.5 MG tablet  Generic drug:  irbesartan-hydrochlorothiazide  Take 1 tablet by mouth daily.     calcium carbonate 750 MG chewable tablet  Commonly known as:  TUMS EX  Chew 1 tablet by mouth every 3 (three) hours as needed for heartburn.     docusate sodium 100 MG capsule  Commonly known as:  COLACE  Take 1 capsule (100 mg total) by mouth 2 (two) times daily.     ferrous sulfate 325 (65 FE) MG tablet  Take 1 tablet (325 mg total) by mouth 3 (three) times daily after meals.     Fish Oil 1200 MG Caps  Take 1,200 mg by mouth 2 (two) times daily.     HYDROcodone-acetaminophen 7.5-325 MG tablet  Commonly known as:  NORCO  Take 1-2 tablets by mouth every 4 (four) hours as needed for moderate pain.     metoprolol succinate 100 MG 24 hr tablet  Commonly known as:  TOPROL-XL  Take 100 mg by mouth at bedtime. Take with or immediately following a meal.     ondansetron 4 MG tablet  Commonly known as:  ZOFRAN  Take 1 tablet (4 mg total) by mouth every 6 (six) hours as needed for nausea.     polyethylene glycol packet  Commonly known as:  MIRALAX / GLYCOLAX  Take 17 g by mouth 2 (two) times daily.         Signed: [place] Pugh. Rumeal Cullipher   PA-C  10/21/2015, 8:45 AM

## 2015-10-26 NOTE — Anesthesia Postprocedure Evaluation (Signed)
Anesthesia Post Note  Patient: Tyler David  Procedure(s) Performed: Procedure(s) (LRB): LEFT TOTAL HIP ARTHROPLASTY ANTERIOR APPROACH (Left)  Patient location during evaluation: PACU Anesthesia Type: Spinal Level of consciousness: oriented and awake and alert Pain management: pain level controlled Vital Signs Assessment: post-procedure vital signs reviewed and stable Respiratory status: spontaneous breathing, respiratory function stable and patient connected to nasal cannula oxygen Cardiovascular status: blood pressure returned to baseline and stable Postop Assessment: no headache and no backache Anesthetic complications: no    Last Vitals:  Filed Vitals:   10/17/15 0401 10/17/15 1103  BP: 140/54 172/70  Pulse: 63 58  Temp: 36.5 C 36.6 C  Resp: 19 20    Last Pain:  Filed Vitals:   10/17/15 1104  PainSc: 1                  Montez Hageman

## 2015-10-30 DIAGNOSIS — Z4789 Encounter for other orthopedic aftercare: Secondary | ICD-10-CM | POA: Diagnosis not present

## 2015-12-02 DIAGNOSIS — Z96642 Presence of left artificial hip joint: Secondary | ICD-10-CM | POA: Diagnosis not present

## 2015-12-02 DIAGNOSIS — M25562 Pain in left knee: Secondary | ICD-10-CM | POA: Diagnosis not present

## 2015-12-02 DIAGNOSIS — Z471 Aftercare following joint replacement surgery: Secondary | ICD-10-CM | POA: Diagnosis not present

## 2016-01-06 DIAGNOSIS — I10 Essential (primary) hypertension: Secondary | ICD-10-CM | POA: Diagnosis not present

## 2016-01-06 DIAGNOSIS — E785 Hyperlipidemia, unspecified: Secondary | ICD-10-CM | POA: Diagnosis not present

## 2016-01-06 DIAGNOSIS — M1990 Unspecified osteoarthritis, unspecified site: Secondary | ICD-10-CM | POA: Diagnosis not present

## 2016-01-06 DIAGNOSIS — G43909 Migraine, unspecified, not intractable, without status migrainosus: Secondary | ICD-10-CM | POA: Diagnosis not present

## 2016-01-13 DIAGNOSIS — M25569 Pain in unspecified knee: Secondary | ICD-10-CM | POA: Diagnosis not present

## 2016-01-13 DIAGNOSIS — Z471 Aftercare following joint replacement surgery: Secondary | ICD-10-CM | POA: Diagnosis not present

## 2016-01-13 DIAGNOSIS — Z96642 Presence of left artificial hip joint: Secondary | ICD-10-CM | POA: Diagnosis not present

## 2016-01-22 DIAGNOSIS — H353132 Nonexudative age-related macular degeneration, bilateral, intermediate dry stage: Secondary | ICD-10-CM | POA: Diagnosis not present

## 2016-01-22 DIAGNOSIS — H524 Presbyopia: Secondary | ICD-10-CM | POA: Diagnosis not present

## 2016-01-22 DIAGNOSIS — H00025 Hordeolum internum left lower eyelid: Secondary | ICD-10-CM | POA: Diagnosis not present

## 2016-01-22 DIAGNOSIS — H00022 Hordeolum internum right lower eyelid: Secondary | ICD-10-CM | POA: Diagnosis not present

## 2016-02-17 IMAGING — MR MRI HEAD WITHOUT CONTRAST
10 series · 42 of 48 positions shown · non-contrast
Comparison: Head CT without contrast 6464 hr the same day.

CLINICAL DATA: 68-year-old male with headache and hypertension with
episode of expressive aphasia. Initial encounter.

EXAM:
MRI HEAD WITHOUT CONTRAST
TECHNIQUE: Multiplanar, multiecho pulse sequences of the brain and surrounding
structures were obtained without intravenous contrast.

[Series 2: T1 · sagittal · 5.0mm · 0.45mm/px · 3 of 29 slices shown (1 of 2)]
[im 1/29]
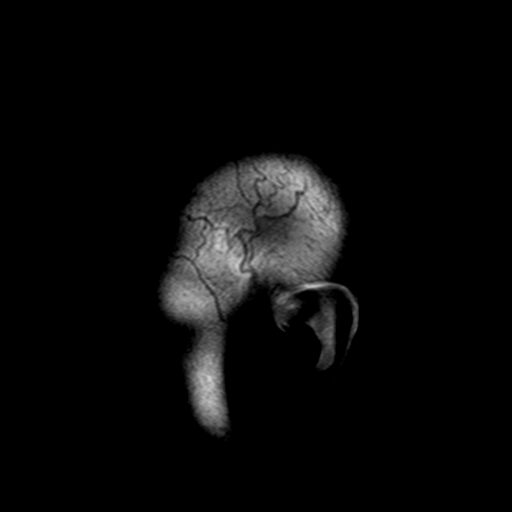
[im 15/29]
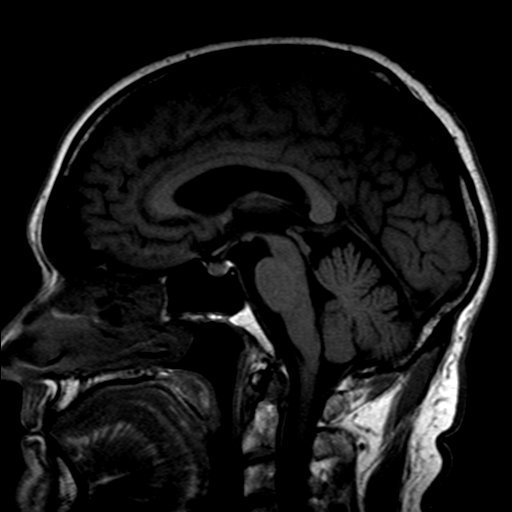
[im 29/29]
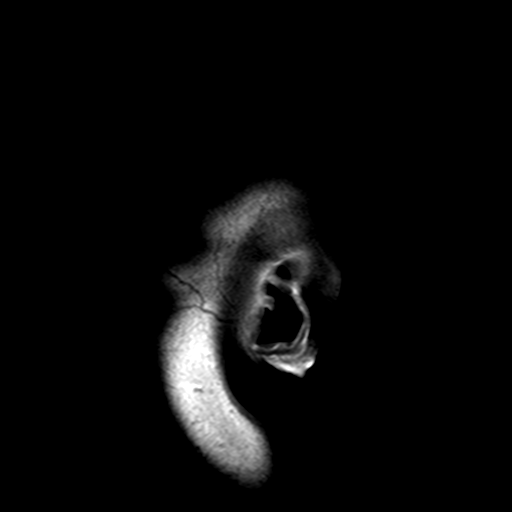

[Series 4: DWI · axial · 4.0mm · 1.80mm/px · z∈[-62,+115]mm · 5 of 35 slices shown (1 of 4)]
[im 1/35]
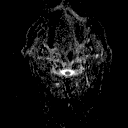
[im 9/35]
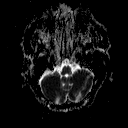
[im 18/35]
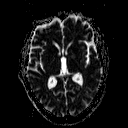
[im 26/35]
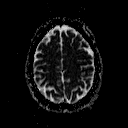
[im 35/35]
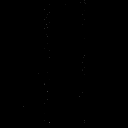

[Series 5: DWI · axial · 4.0mm · 1.80mm/px · z∈[-62,+104]mm · 4 of 33 slices shown (2 of 4)]
[im 1/33]
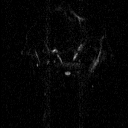
[im 11/33]
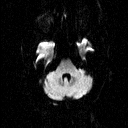
[im 22/33]
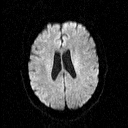
[im 33/33]
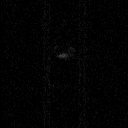

[Series 7: DWI · coronal · 5.0mm · 1.80mm/px · 6 of 44 slices shown (3 of 4)]
[im 1/44]
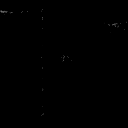
[im 9/44]
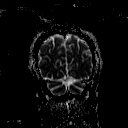
[im 18/44]
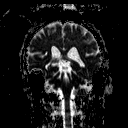
[im 26/44]
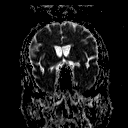
[im 35/44]
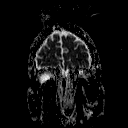
[im 44/44]
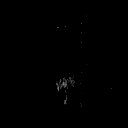

[Series 8: DWI · coronal · 5.0mm · 1.80mm/px · 5 of 38 slices shown (4 of 4)]
[im 1/38]
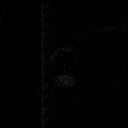
[im 10/38]
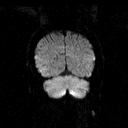
[im 19/38]
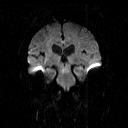
[im 28/38]
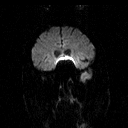
[im 38/38]
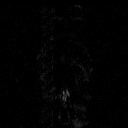

[Series 9: T2 · axial · 5.0mm · 0.45mm/px · z∈[-58,+111]mm · 4 of 27 slices shown (1 of 3)]
[im 1/27]
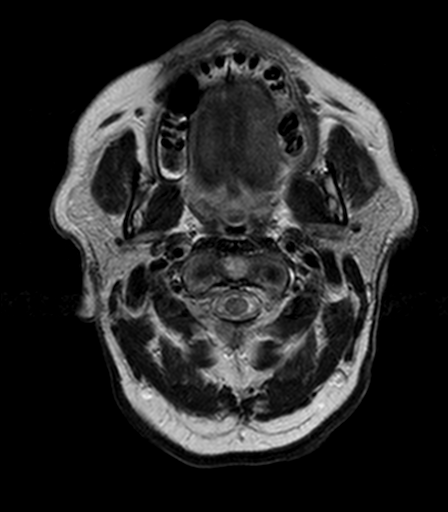
[im 9/27]
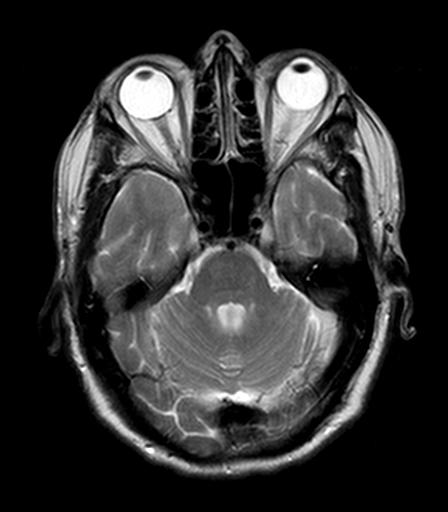
[im 18/27]
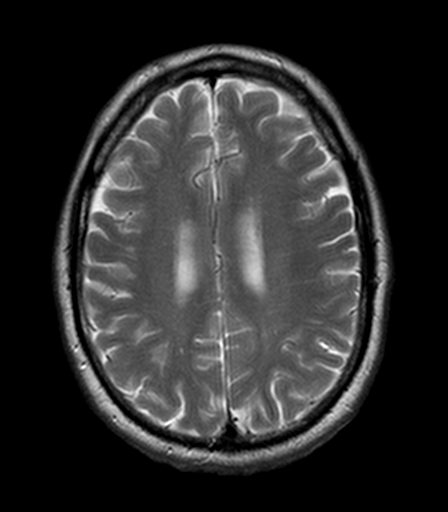
[im 27/27]
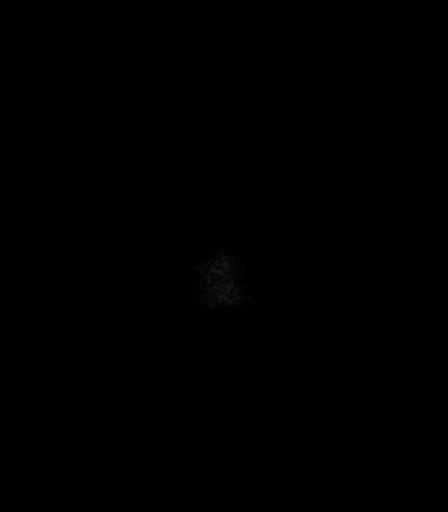

[Series 10: FLAIR · axial · 5.0mm · 0.90mm/px · z∈[-58,+111]mm · 4 of 27 slices shown]
[im 1/27]
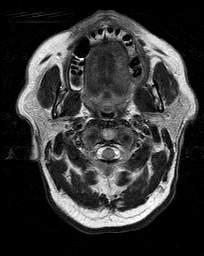
[im 9/27]
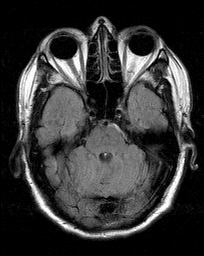
[im 18/27]
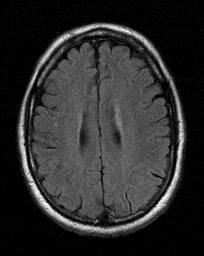
[im 27/27]
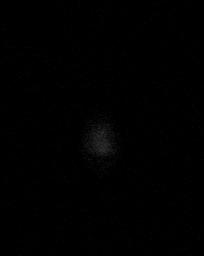

[Series 11: T2 · axial · 5.0mm · 0.45mm/px · z∈[-58,+111]mm · 4 of 27 slices shown (2 of 3)]
[im 1/27]
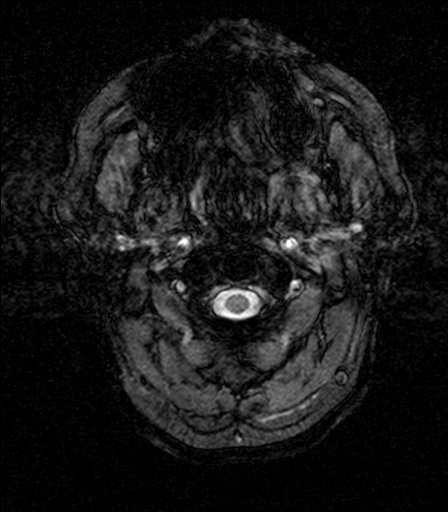
[im 9/27]
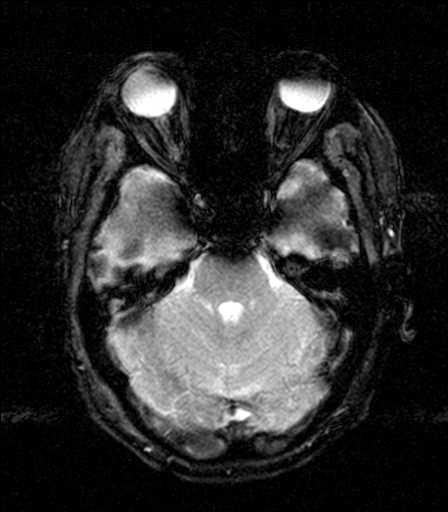
[im 18/27]
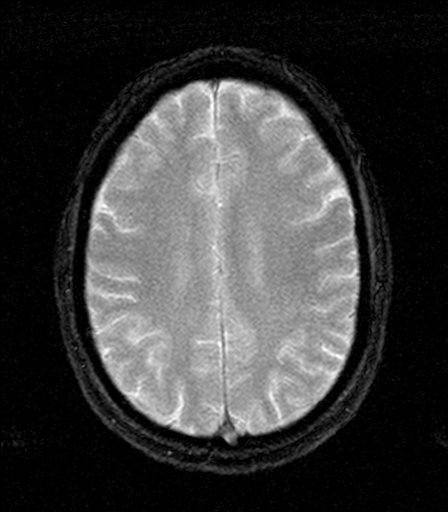
[im 27/27]
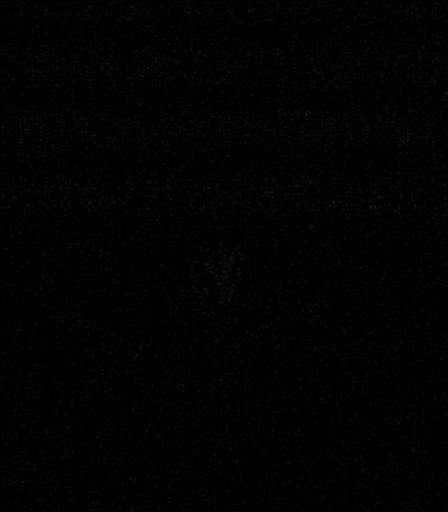

[Series 12: T1 · axial · 3.0mm · 0.45mm/px · z∈[-68,-23]mm · 3 of 64 slices shown (2 of 2)]
[im 1/64]
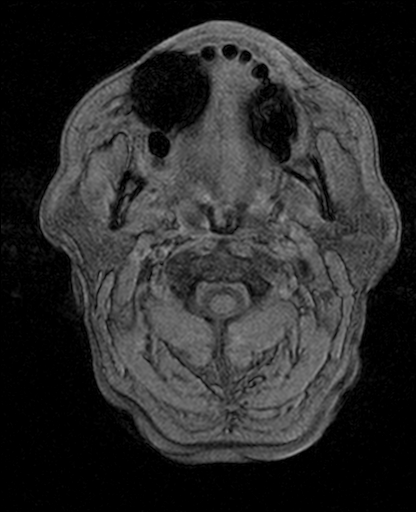
[im 8/64]
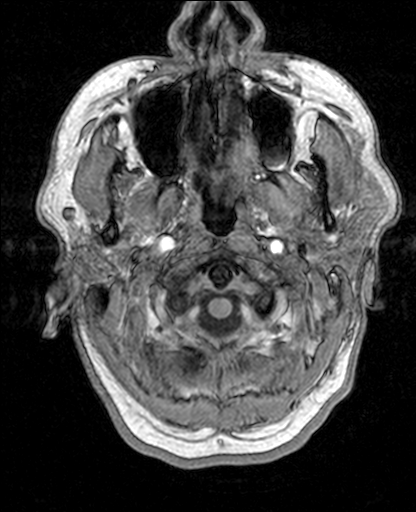
[im 16/64]
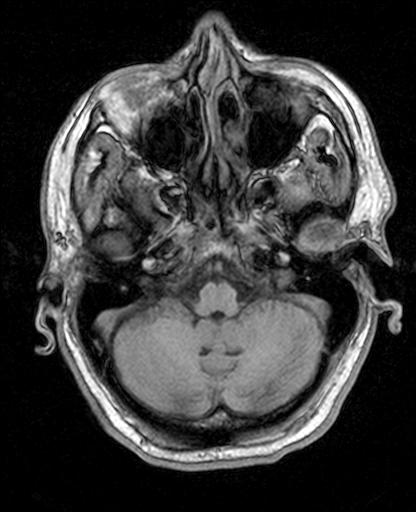

[Series 13: T2 · coronal · 5.0mm · 0.45mm/px · 4 of 33 slices shown (3 of 3)]
[im 1/33]
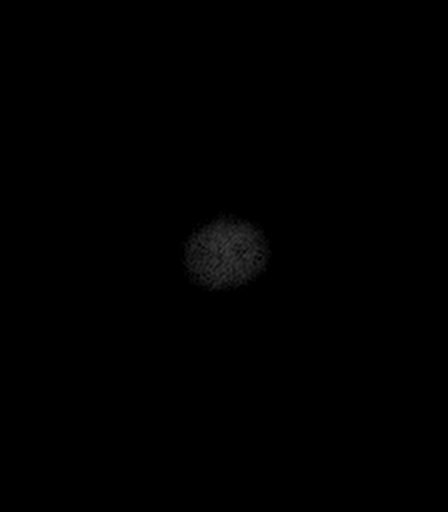
[im 11/33]
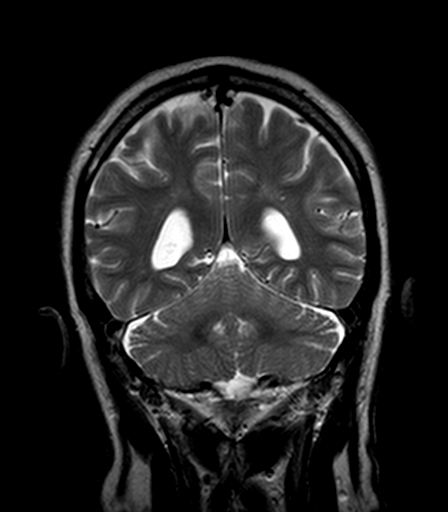
[im 22/33]
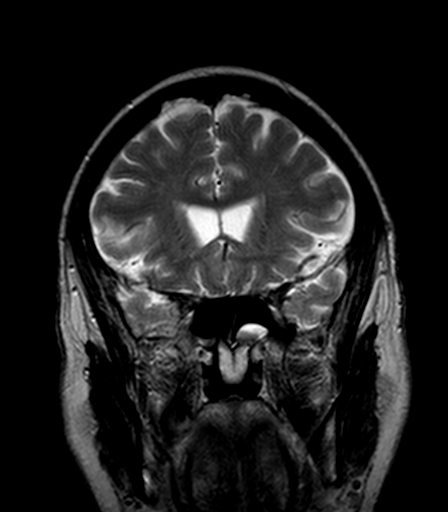
[im 33/33]
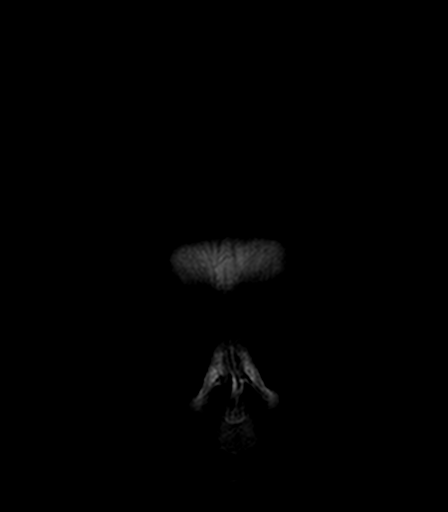

[42 of 48 positions shown; findings below may reference images not displayed]

FINDINGS: Cerebral volume is normal. No restricted diffusion to suggest acute
infarction. No midline shift, mass effect, evidence of mass lesion,
ventriculomegaly, extra-axial collection or acute intracranial
hemorrhage. Cervicomedullary junction and pituitary are within
normal limits. Negative visualized cervical spine. Major
intracranial vascular flow voids are preserved.

Gray and white matter signal is within normal limits for age
throughout the brain.

Visible internal auditory structures appear normal. Mastoids are
clear. Small mucous retention cyst in the left sphenoid sinus,
minimal mucosal thickening in the left maxillary alveolar recess.
Other paranasal sinuses are clear. Visualized orbit soft tissues are
within normal limits. Visualized scalp soft tissues are within
normal limits. Normal bone marrow signal.
IMPRESSION: No acute infarct. Normal for age non contrast MRI appearance of the
brain.

## 2016-02-17 IMAGING — US US CAROTID DUPLEX BILAT
1 series · 13 of 24 positions shown · non-contrast
Comparison: None.

CLINICAL DATA: EXPRESSIVE APHASIA x2 days, hypertension

EXAM:
BILATERAL CAROTID DUPLEX ULTRASOUND
TECHNIQUE: Gray scale imaging, color Doppler and duplex ultrasound was
performed of bilateral carotid and vertebral arteries in the neck.

[Series 1: us carotid duplex bilat · 0.06mm/px · 13 of 60 slices shown]
[im 1/60]
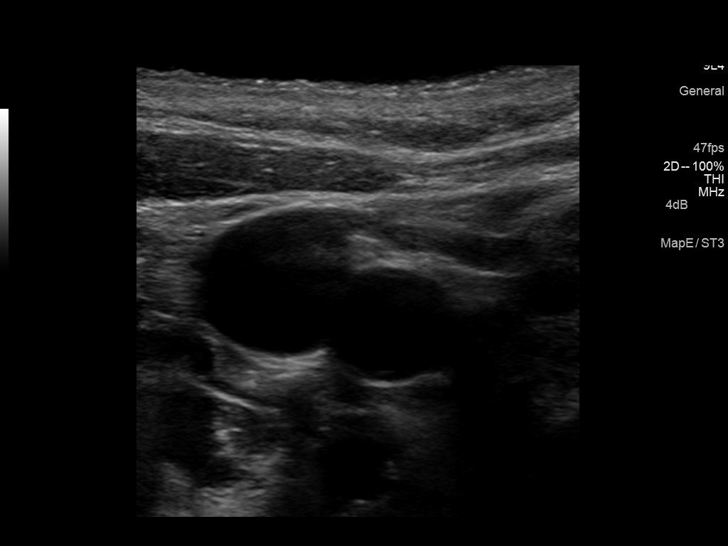
[im 6/60]
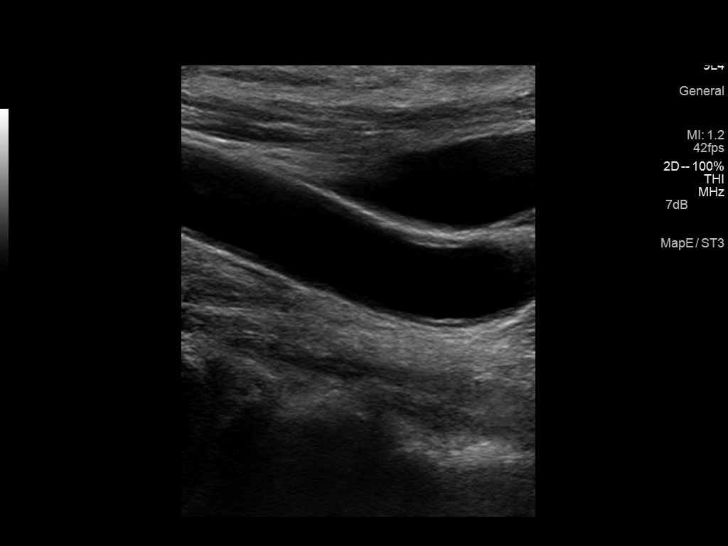
[im 11/60]
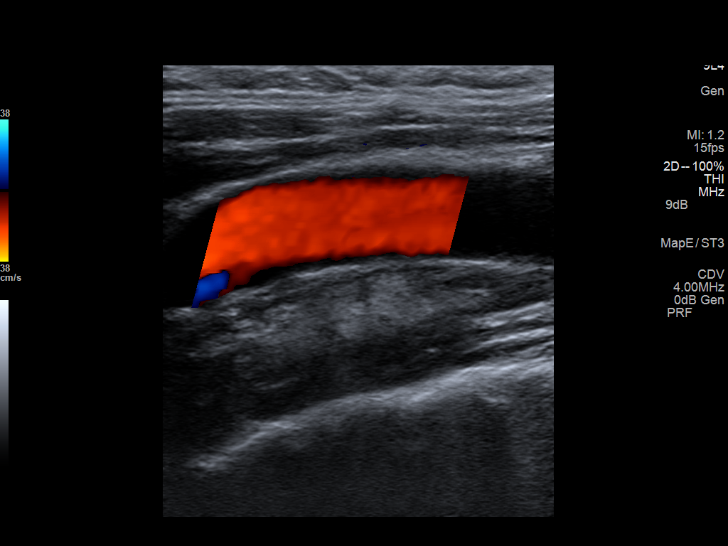
[im 16/60]
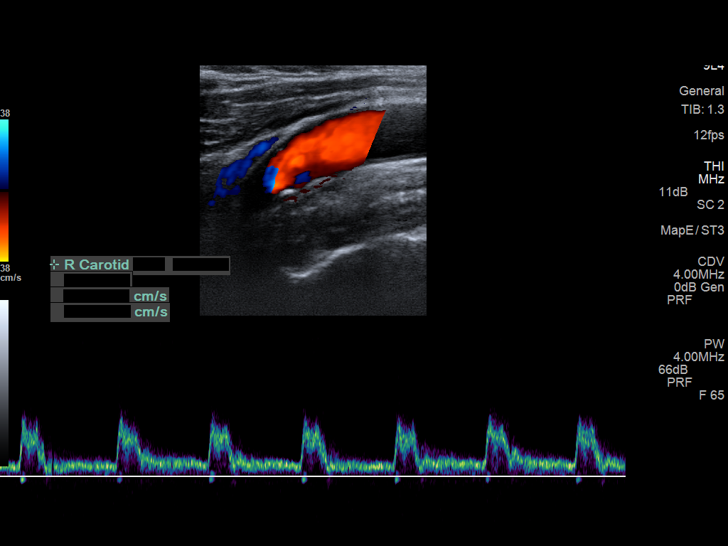
[im 21/60]
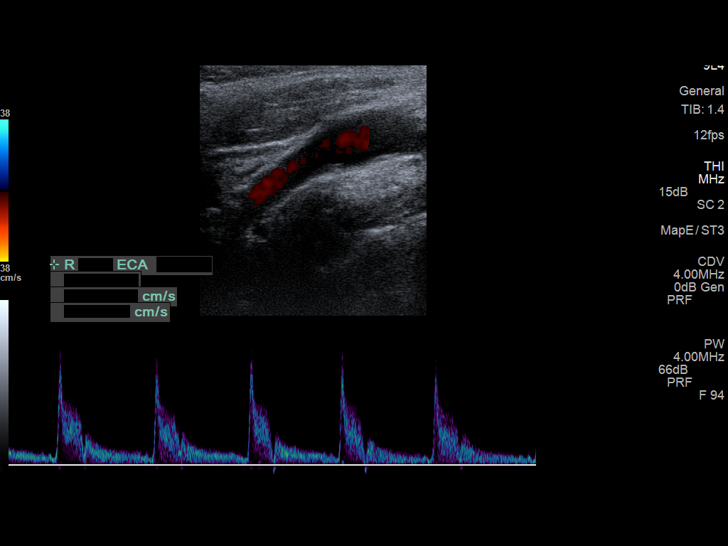
[im 26/60]
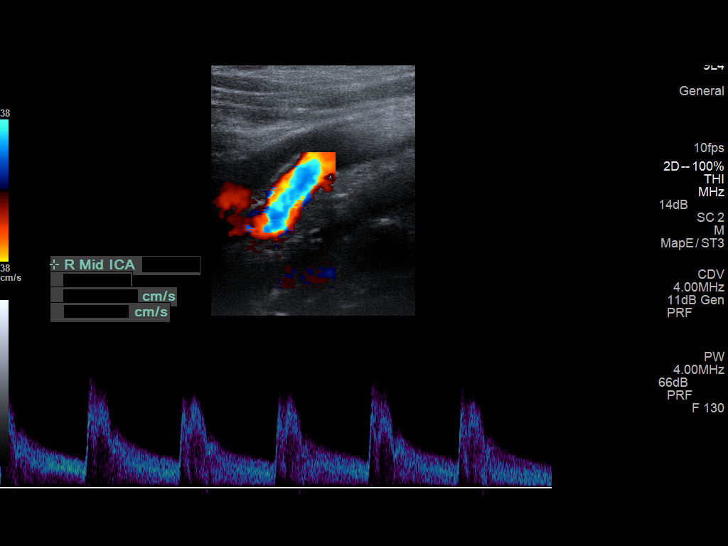
[im 31/60]
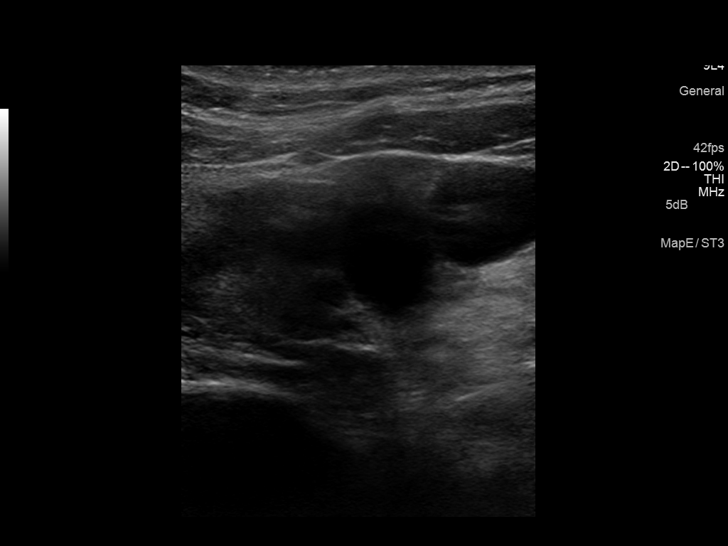
[im 34/60]
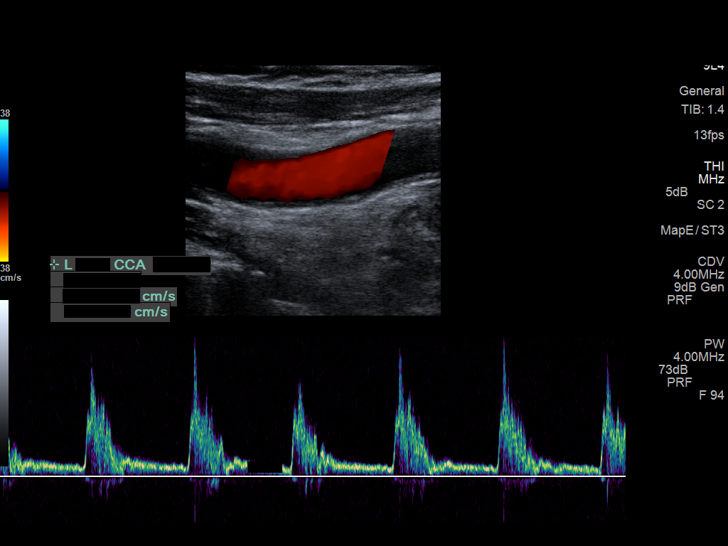
[im 39/60]
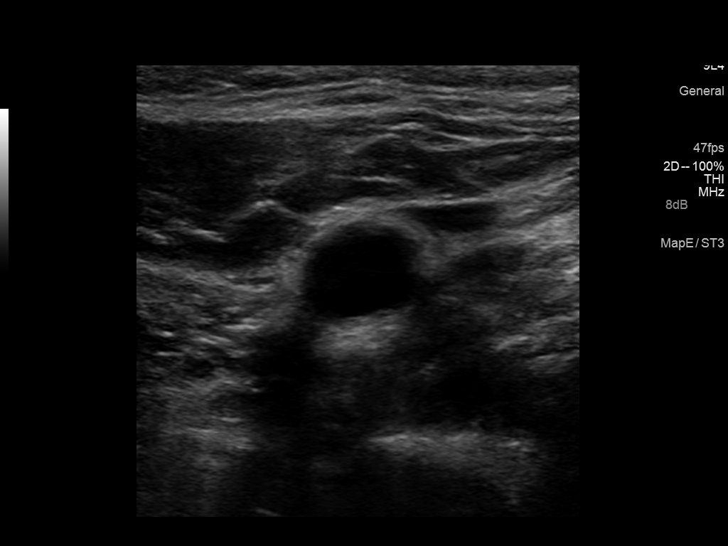
[im 44/60]
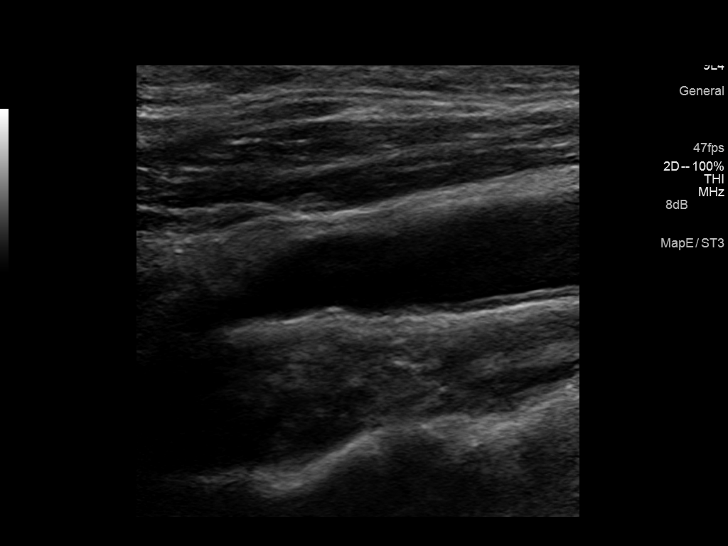
[im 49/60]
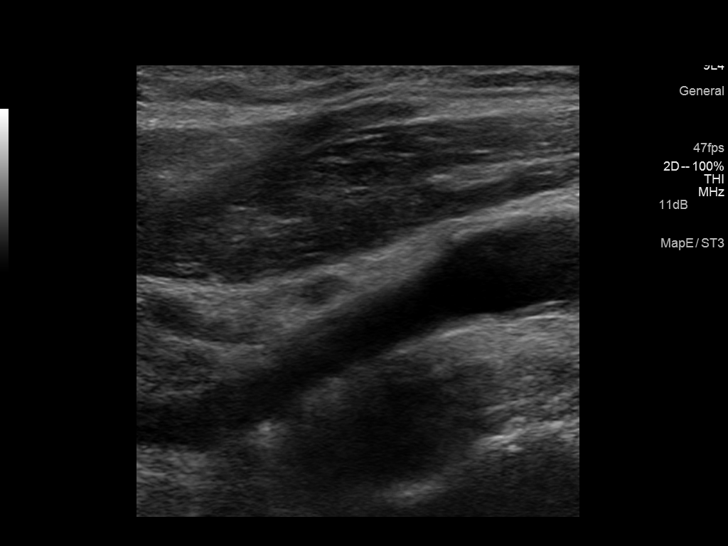
[im 54/60]
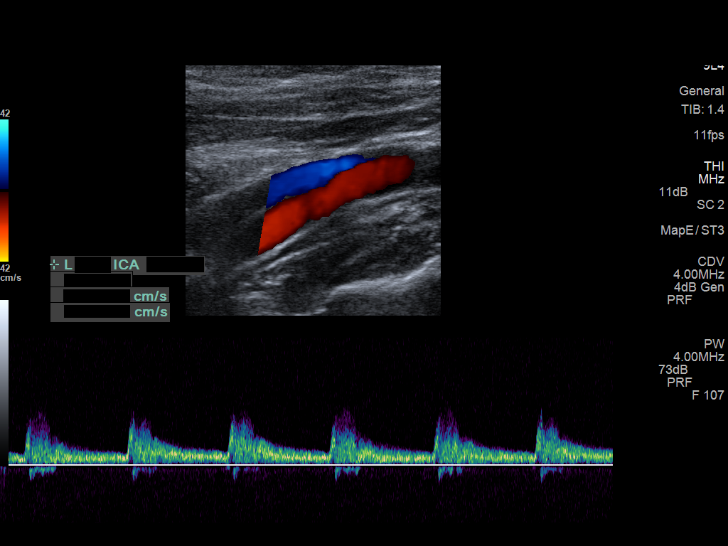
[im 60/60]
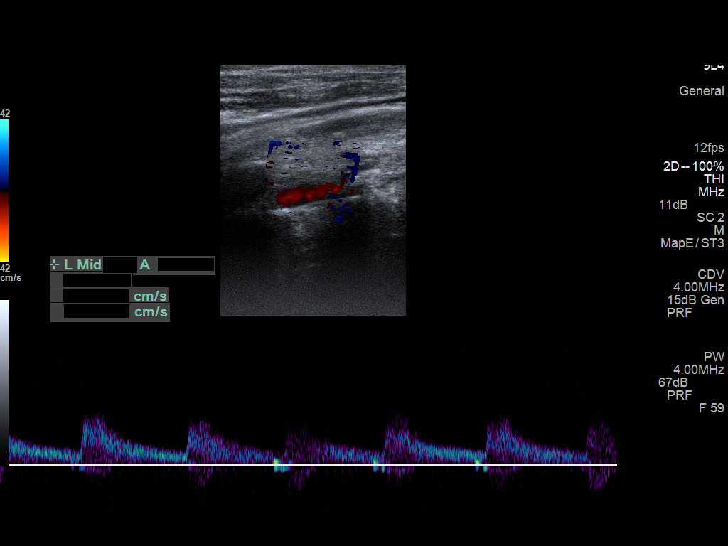

[13 of 24 positions shown; findings below may reference images not displayed]

REVIEW OF SYSTEMS:
Quantification of carotid stenosis is based on velocity parameters
that correlate the residual internal carotid diameter with
NASCET-based stenosis levels, using the diameter of the distal
internal carotid lumen as the denominator for stenosis measurement.

The following velocity measurements were obtained:

PEAK SYSTOLIC/END DIASTOLIC

RIGHT

ICA:                     142/39cm/sec

CCA:                     84/15cm/sec

SYSTOLIC ICA/CCA RATIO:

DIASTOLIC ICA/CCA RATIO:

ECA:                     150cm/sec

LEFT

ICA:                     89/21cm/sec

CCA:                     85/15cm/sec

SYSTOLIC ICA/CCA RATIO:

DIASTOLIC ICA/CCA RATIO:

ECA:                     150cm/sec
FINDINGS: RIGHT CAROTID ARTERY: Mild eccentric nonocclusive plaque in the
carotid bulb extending into the proximal ICA. No high-grade
stenosis. Normal waveforms and color Doppler signal.

RIGHT VERTEBRAL ARTERY:  Normal flow direction and waveform.

LEFT CAROTID ARTERY: Mild diffuse intimal thickening without focal
plaque accumulation or stenosis. Normal waveforms and color Doppler
signal.

LEFT VERTEBRAL ARTERY: Normal flow direction and waveform.
IMPRESSION: 1. Mild right carotid bifurcation and proximal ICA plaque, resulting
in less than 50% diameter stenosis. The exam does not exclude plaque
ulceration or embolization. Continued surveillance recommended.

## 2016-02-26 DIAGNOSIS — I10 Essential (primary) hypertension: Secondary | ICD-10-CM | POA: Diagnosis not present

## 2016-05-04 DIAGNOSIS — Z85820 Personal history of malignant melanoma of skin: Secondary | ICD-10-CM | POA: Diagnosis not present

## 2016-05-04 DIAGNOSIS — L821 Other seborrheic keratosis: Secondary | ICD-10-CM | POA: Diagnosis not present

## 2016-05-04 DIAGNOSIS — L814 Other melanin hyperpigmentation: Secondary | ICD-10-CM | POA: Diagnosis not present

## 2016-05-04 DIAGNOSIS — L570 Actinic keratosis: Secondary | ICD-10-CM | POA: Diagnosis not present

## 2016-05-04 DIAGNOSIS — D225 Melanocytic nevi of trunk: Secondary | ICD-10-CM | POA: Diagnosis not present

## 2016-05-04 DIAGNOSIS — D1800 Hemangioma unspecified site: Secondary | ICD-10-CM | POA: Diagnosis not present

## 2016-05-04 DIAGNOSIS — Z85828 Personal history of other malignant neoplasm of skin: Secondary | ICD-10-CM | POA: Diagnosis not present

## 2016-07-12 DIAGNOSIS — Z23 Encounter for immunization: Secondary | ICD-10-CM | POA: Diagnosis not present

## 2016-07-12 DIAGNOSIS — C61 Malignant neoplasm of prostate: Secondary | ICD-10-CM | POA: Diagnosis not present

## 2016-07-12 DIAGNOSIS — I10 Essential (primary) hypertension: Secondary | ICD-10-CM | POA: Diagnosis not present

## 2016-07-12 DIAGNOSIS — Z0000 Encounter for general adult medical examination without abnormal findings: Secondary | ICD-10-CM | POA: Diagnosis not present

## 2016-07-12 DIAGNOSIS — Z1211 Encounter for screening for malignant neoplasm of colon: Secondary | ICD-10-CM | POA: Diagnosis not present

## 2016-07-12 DIAGNOSIS — E7800 Pure hypercholesterolemia, unspecified: Secondary | ICD-10-CM | POA: Diagnosis not present

## 2016-09-20 DIAGNOSIS — Z1211 Encounter for screening for malignant neoplasm of colon: Secondary | ICD-10-CM | POA: Diagnosis not present

## 2016-09-20 DIAGNOSIS — K5730 Diverticulosis of large intestine without perforation or abscess without bleeding: Secondary | ICD-10-CM | POA: Diagnosis not present

## 2016-09-20 DIAGNOSIS — K635 Polyp of colon: Secondary | ICD-10-CM | POA: Diagnosis not present

## 2016-09-20 DIAGNOSIS — K621 Rectal polyp: Secondary | ICD-10-CM | POA: Diagnosis not present

## 2016-09-23 DIAGNOSIS — Z1211 Encounter for screening for malignant neoplasm of colon: Secondary | ICD-10-CM | POA: Diagnosis not present

## 2016-09-23 DIAGNOSIS — K635 Polyp of colon: Secondary | ICD-10-CM | POA: Diagnosis not present

## 2017-01-09 DIAGNOSIS — N183 Chronic kidney disease, stage 3 (moderate): Secondary | ICD-10-CM | POA: Diagnosis not present

## 2017-01-09 DIAGNOSIS — K219 Gastro-esophageal reflux disease without esophagitis: Secondary | ICD-10-CM | POA: Diagnosis not present

## 2017-01-09 DIAGNOSIS — M1990 Unspecified osteoarthritis, unspecified site: Secondary | ICD-10-CM | POA: Diagnosis not present

## 2017-01-09 DIAGNOSIS — E7800 Pure hypercholesterolemia, unspecified: Secondary | ICD-10-CM | POA: Diagnosis not present

## 2017-01-09 DIAGNOSIS — C61 Malignant neoplasm of prostate: Secondary | ICD-10-CM | POA: Diagnosis not present

## 2017-01-09 DIAGNOSIS — I10 Essential (primary) hypertension: Secondary | ICD-10-CM | POA: Diagnosis not present

## 2017-01-09 DIAGNOSIS — R946 Abnormal results of thyroid function studies: Secondary | ICD-10-CM | POA: Diagnosis not present

## 2017-01-09 DIAGNOSIS — R202 Paresthesia of skin: Secondary | ICD-10-CM | POA: Diagnosis not present

## 2017-03-18 IMAGING — DX DG HIP (WITH OR WITHOUT PELVIS) 1V PORT*L*
2 series · 2 of 2 positions shown · non-contrast
Comparison: None.

CLINICAL DATA: Osteoarthritis of the left hip.

EXAM:
DG HIP (WITH OR WITHOUT PELVIS) 1V PORT LEFT

[pelvis ap]
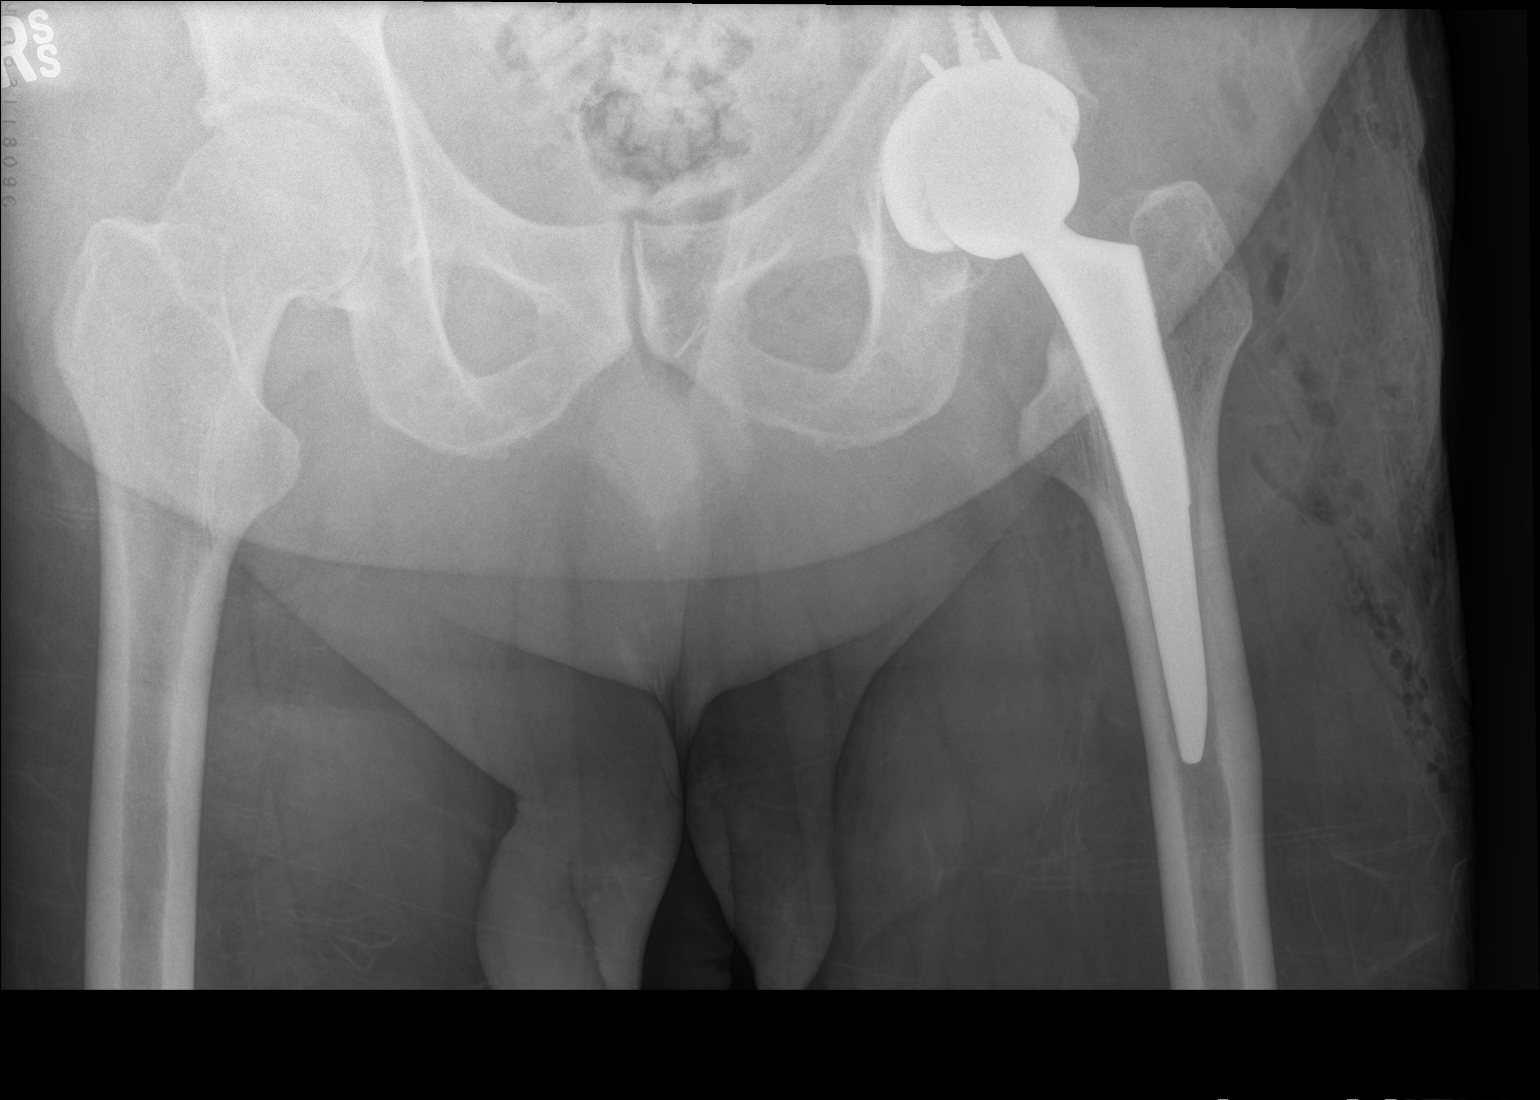

[hip x-table]
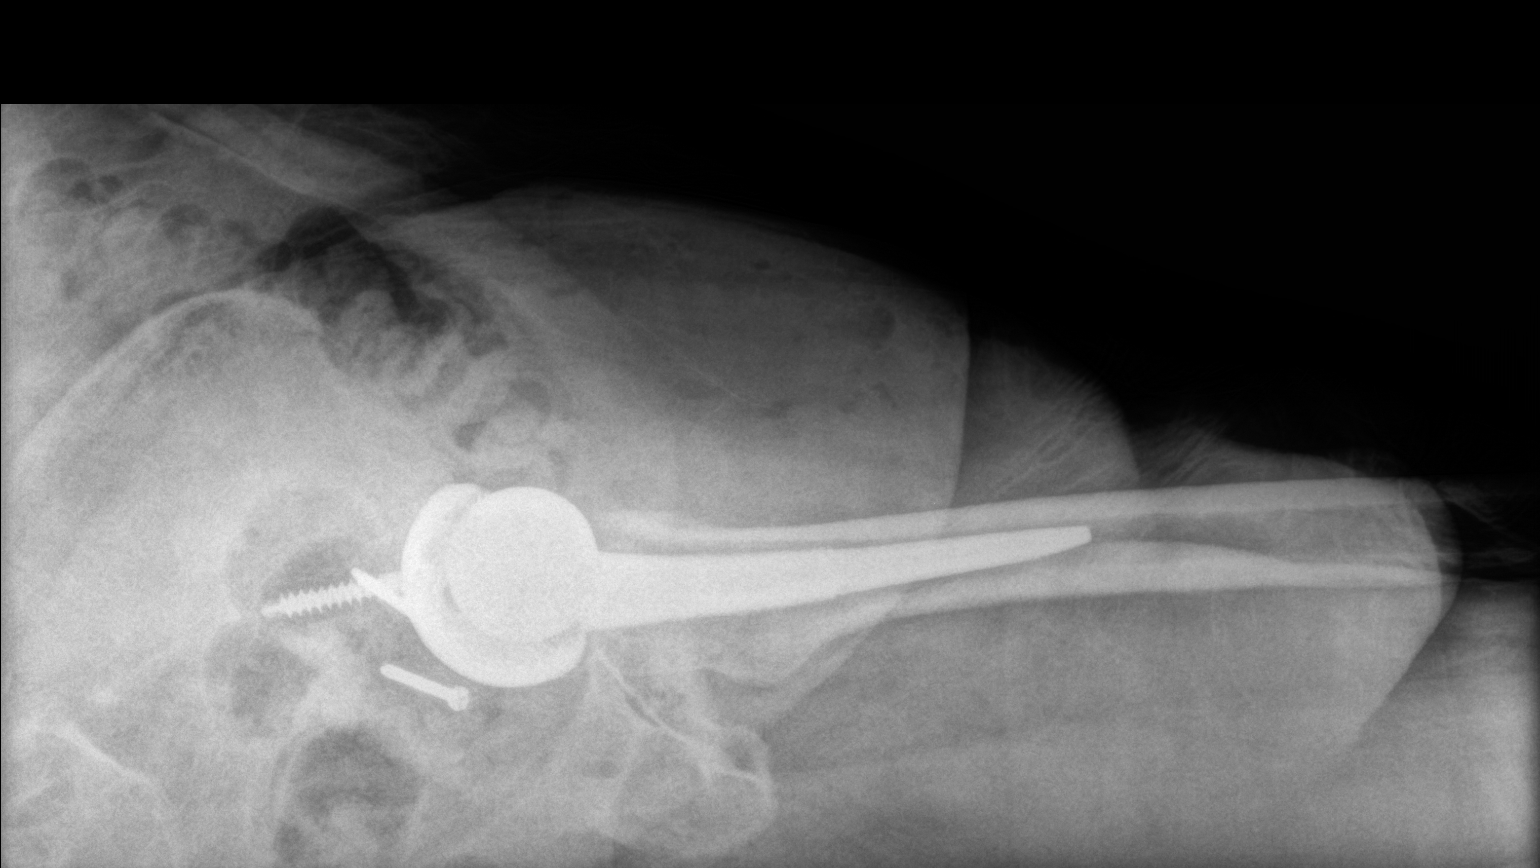

[2 of 2 positions shown; findings below may reference images not displayed]

FINDINGS: The patient has undergone left total hip prosthesis insertion.
Acetabular and femoral components appear in good position. There are
2 screws in the superior aspect of the acetabulum.
IMPRESSION: Satisfactory appearance of the left hip after total hip prosthesis
insertion.

## 2017-03-24 DIAGNOSIS — R946 Abnormal results of thyroid function studies: Secondary | ICD-10-CM | POA: Diagnosis not present

## 2017-03-24 DIAGNOSIS — I10 Essential (primary) hypertension: Secondary | ICD-10-CM | POA: Diagnosis not present

## 2017-05-08 DIAGNOSIS — Z85820 Personal history of malignant melanoma of skin: Secondary | ICD-10-CM | POA: Diagnosis not present

## 2017-05-08 DIAGNOSIS — Z85828 Personal history of other malignant neoplasm of skin: Secondary | ICD-10-CM | POA: Diagnosis not present

## 2017-05-08 DIAGNOSIS — L219 Seborrheic dermatitis, unspecified: Secondary | ICD-10-CM | POA: Diagnosis not present

## 2017-05-08 DIAGNOSIS — L821 Other seborrheic keratosis: Secondary | ICD-10-CM | POA: Diagnosis not present

## 2017-05-08 DIAGNOSIS — L570 Actinic keratosis: Secondary | ICD-10-CM | POA: Diagnosis not present

## 2017-05-08 DIAGNOSIS — D1800 Hemangioma unspecified site: Secondary | ICD-10-CM | POA: Diagnosis not present

## 2017-05-08 DIAGNOSIS — L814 Other melanin hyperpigmentation: Secondary | ICD-10-CM | POA: Diagnosis not present

## 2017-05-08 DIAGNOSIS — D225 Melanocytic nevi of trunk: Secondary | ICD-10-CM | POA: Diagnosis not present

## 2017-05-22 DIAGNOSIS — H938X1 Other specified disorders of right ear: Secondary | ICD-10-CM | POA: Diagnosis not present

## 2017-05-22 DIAGNOSIS — M7989 Other specified soft tissue disorders: Secondary | ICD-10-CM | POA: Diagnosis not present

## 2017-05-22 DIAGNOSIS — E669 Obesity, unspecified: Secondary | ICD-10-CM | POA: Diagnosis not present

## 2017-05-22 DIAGNOSIS — L309 Dermatitis, unspecified: Secondary | ICD-10-CM | POA: Diagnosis not present

## 2017-07-13 DIAGNOSIS — E7800 Pure hypercholesterolemia, unspecified: Secondary | ICD-10-CM | POA: Diagnosis not present

## 2017-07-13 DIAGNOSIS — G43909 Migraine, unspecified, not intractable, without status migrainosus: Secondary | ICD-10-CM | POA: Diagnosis not present

## 2017-07-13 DIAGNOSIS — Z8546 Personal history of malignant neoplasm of prostate: Secondary | ICD-10-CM | POA: Diagnosis not present

## 2017-07-13 DIAGNOSIS — H93A2 Pulsatile tinnitus, left ear: Secondary | ICD-10-CM | POA: Diagnosis not present

## 2017-07-13 DIAGNOSIS — N183 Chronic kidney disease, stage 3 (moderate): Secondary | ICD-10-CM | POA: Diagnosis not present

## 2017-07-13 DIAGNOSIS — I10 Essential (primary) hypertension: Secondary | ICD-10-CM | POA: Diagnosis not present

## 2017-07-13 DIAGNOSIS — Z23 Encounter for immunization: Secondary | ICD-10-CM | POA: Diagnosis not present

## 2017-07-13 DIAGNOSIS — Z0000 Encounter for general adult medical examination without abnormal findings: Secondary | ICD-10-CM | POA: Diagnosis not present

## 2017-07-13 DIAGNOSIS — R946 Abnormal results of thyroid function studies: Secondary | ICD-10-CM | POA: Diagnosis not present

## 2017-07-14 ENCOUNTER — Other Ambulatory Visit: Payer: Self-pay | Admitting: Family Medicine

## 2017-07-14 DIAGNOSIS — H93A2 Pulsatile tinnitus, left ear: Secondary | ICD-10-CM

## 2017-07-19 ENCOUNTER — Other Ambulatory Visit: Payer: Self-pay

## 2017-12-06 DIAGNOSIS — H2513 Age-related nuclear cataract, bilateral: Secondary | ICD-10-CM | POA: Diagnosis not present

## 2017-12-06 DIAGNOSIS — H524 Presbyopia: Secondary | ICD-10-CM | POA: Diagnosis not present

## 2017-12-06 DIAGNOSIS — H52223 Regular astigmatism, bilateral: Secondary | ICD-10-CM | POA: Diagnosis not present

## 2017-12-06 DIAGNOSIS — H353131 Nonexudative age-related macular degeneration, bilateral, early dry stage: Secondary | ICD-10-CM | POA: Diagnosis not present

## 2018-01-12 DIAGNOSIS — E7800 Pure hypercholesterolemia, unspecified: Secondary | ICD-10-CM | POA: Diagnosis not present

## 2018-01-12 DIAGNOSIS — Z8546 Personal history of malignant neoplasm of prostate: Secondary | ICD-10-CM | POA: Diagnosis not present

## 2018-01-12 DIAGNOSIS — I10 Essential (primary) hypertension: Secondary | ICD-10-CM | POA: Diagnosis not present

## 2018-01-12 DIAGNOSIS — R946 Abnormal results of thyroid function studies: Secondary | ICD-10-CM | POA: Diagnosis not present

## 2018-01-12 DIAGNOSIS — N183 Chronic kidney disease, stage 3 (moderate): Secondary | ICD-10-CM | POA: Diagnosis not present

## 2018-02-22 DIAGNOSIS — N390 Urinary tract infection, site not specified: Secondary | ICD-10-CM | POA: Diagnosis not present

## 2018-02-22 DIAGNOSIS — R350 Frequency of micturition: Secondary | ICD-10-CM | POA: Diagnosis not present

## 2018-03-15 DIAGNOSIS — H903 Sensorineural hearing loss, bilateral: Secondary | ICD-10-CM | POA: Diagnosis not present

## 2018-05-09 DIAGNOSIS — L570 Actinic keratosis: Secondary | ICD-10-CM | POA: Diagnosis not present

## 2018-05-09 DIAGNOSIS — D225 Melanocytic nevi of trunk: Secondary | ICD-10-CM | POA: Diagnosis not present

## 2018-05-09 DIAGNOSIS — L821 Other seborrheic keratosis: Secondary | ICD-10-CM | POA: Diagnosis not present

## 2018-05-09 DIAGNOSIS — D1800 Hemangioma unspecified site: Secondary | ICD-10-CM | POA: Diagnosis not present

## 2018-05-09 DIAGNOSIS — Z85828 Personal history of other malignant neoplasm of skin: Secondary | ICD-10-CM | POA: Diagnosis not present

## 2018-05-09 DIAGNOSIS — Z85820 Personal history of malignant melanoma of skin: Secondary | ICD-10-CM | POA: Diagnosis not present

## 2018-05-09 DIAGNOSIS — L814 Other melanin hyperpigmentation: Secondary | ICD-10-CM | POA: Diagnosis not present

## 2018-05-09 DIAGNOSIS — D485 Neoplasm of uncertain behavior of skin: Secondary | ICD-10-CM | POA: Diagnosis not present

## 2018-06-07 ENCOUNTER — Other Ambulatory Visit: Payer: Self-pay | Admitting: *Deleted

## 2018-06-07 ENCOUNTER — Encounter: Payer: Self-pay | Admitting: *Deleted

## 2018-06-07 DIAGNOSIS — M179 Osteoarthritis of knee, unspecified: Secondary | ICD-10-CM | POA: Insufficient documentation

## 2018-06-07 DIAGNOSIS — M1710 Unilateral primary osteoarthritis, unspecified knee: Secondary | ICD-10-CM | POA: Insufficient documentation

## 2018-06-07 NOTE — Patient Outreach (Signed)
[facility]) Care Management  06/07/2018  TAYLEN OSORTO 02-18-1946 [id number]   Telephone Screen  Referral Date: 06/06/18  Referral Source: Nurse call center Referral Reason: Right great toe pain and swelling  Insurance: health team advantage   Outreach attempt # 1, successful   Patient is able to verify HIPAA Reviewed and addressed referral to [facility] with patient Mr Bougher reports he has not been to see a MD. He looked up in grown toe nail treatment on the Internet. He is now soaking his right great toe qid with epsom salt and having his wife assist with release of "pus " He has not seen his primary care MD  Saw primary care MD for a physical 3-4 months ago  [facility] RN CM discussed having him call MD office to discuss his concern to get a referral to a Podiatry. He has a friend who reports his insurance pays for pedicures.  [facility] RN CM discussed differences in pedicures and podiatry services.  Referred him to his HTA concierge Mr [facility] also discuss "need for a hear aid" when clarified he reports he was evaluated and a MD recommended a hearing aid but he "don't want to fool with them right now",  "I'm winging it" Mr Kudrna was encouraged to contact HTA concierge, this [facility] RN CM or MD when he is ready to get the recommended assistance for his toe and hearing aid. [facility] RN CM contact number provided Mr Stelle was appreciative of the follow up call from [facility] RN CM  Social: lives with is wife and is independent in his care.   Conditions: HTN, GERD, HDL, former smoker, hx of prostate cancer s/p prostatectomy, migraine    Medications: denies concerns with taking medications as prescribed, affording medications, side effects of medications and questions about medications Appointments: Advance Directives: Denies need for assist with or assist with changes for advance directives   Consent: [facility] RN CM reviewed [facility] services with patient. He denies need of services from [facility]  Community/Telephonic RN CM, pharmacy or SW at this time  Plan: [place] CM will close case at this time as patient has been assessed and no needs identified  Rilla Buckman L. Lavina Hamman, RN, BSN, [place] Management Care Coordinator Direct Number [phone] Mobile number [phone]  Main [facility] number [phone] Fax number [phone]

## 2018-06-19 DIAGNOSIS — C44519 Basal cell carcinoma of skin of other part of trunk: Secondary | ICD-10-CM | POA: Diagnosis not present

## 2018-07-25 DIAGNOSIS — L600 Ingrowing nail: Secondary | ICD-10-CM | POA: Diagnosis not present

## 2018-07-25 DIAGNOSIS — Z8546 Personal history of malignant neoplasm of prostate: Secondary | ICD-10-CM | POA: Diagnosis not present

## 2018-07-25 DIAGNOSIS — R946 Abnormal results of thyroid function studies: Secondary | ICD-10-CM | POA: Diagnosis not present

## 2018-07-25 DIAGNOSIS — Z0000 Encounter for general adult medical examination without abnormal findings: Secondary | ICD-10-CM | POA: Diagnosis not present

## 2018-07-25 DIAGNOSIS — I10 Essential (primary) hypertension: Secondary | ICD-10-CM | POA: Diagnosis not present

## 2018-07-25 DIAGNOSIS — Z23 Encounter for immunization: Secondary | ICD-10-CM | POA: Diagnosis not present

## 2018-07-25 DIAGNOSIS — E669 Obesity, unspecified: Secondary | ICD-10-CM | POA: Diagnosis not present

## 2018-07-25 DIAGNOSIS — E7800 Pure hypercholesterolemia, unspecified: Secondary | ICD-10-CM | POA: Diagnosis not present

## 2018-07-25 DIAGNOSIS — N183 Chronic kidney disease, stage 3 (moderate): Secondary | ICD-10-CM | POA: Diagnosis not present

## 2018-10-05 DIAGNOSIS — L600 Ingrowing nail: Secondary | ICD-10-CM | POA: Diagnosis not present

## 2018-10-05 DIAGNOSIS — L03031 Cellulitis of right toe: Secondary | ICD-10-CM | POA: Diagnosis not present

## 2018-11-13 DIAGNOSIS — M10071 Idiopathic gout, right ankle and foot: Secondary | ICD-10-CM | POA: Diagnosis not present

## 2018-11-13 DIAGNOSIS — M79671 Pain in right foot: Secondary | ICD-10-CM | POA: Diagnosis not present

## 2019-01-30 DIAGNOSIS — J309 Allergic rhinitis, unspecified: Secondary | ICD-10-CM | POA: Diagnosis not present

## 2019-01-30 DIAGNOSIS — E7800 Pure hypercholesterolemia, unspecified: Secondary | ICD-10-CM | POA: Diagnosis not present

## 2019-01-30 DIAGNOSIS — N183 Chronic kidney disease, stage 3 (moderate): Secondary | ICD-10-CM | POA: Diagnosis not present

## 2019-01-30 DIAGNOSIS — I10 Essential (primary) hypertension: Secondary | ICD-10-CM | POA: Diagnosis not present

## 2019-01-30 DIAGNOSIS — Z8546 Personal history of malignant neoplasm of prostate: Secondary | ICD-10-CM | POA: Diagnosis not present

## 2019-01-30 DIAGNOSIS — K219 Gastro-esophageal reflux disease without esophagitis: Secondary | ICD-10-CM | POA: Diagnosis not present

## 2019-01-30 DIAGNOSIS — R946 Abnormal results of thyroid function studies: Secondary | ICD-10-CM | POA: Diagnosis not present

## 2019-02-01 DIAGNOSIS — N183 Chronic kidney disease, stage 3 (moderate): Secondary | ICD-10-CM | POA: Diagnosis not present

## 2019-02-01 DIAGNOSIS — R946 Abnormal results of thyroid function studies: Secondary | ICD-10-CM | POA: Diagnosis not present

## 2019-04-23 DIAGNOSIS — L03116 Cellulitis of left lower limb: Secondary | ICD-10-CM | POA: Diagnosis not present

## 2019-04-23 DIAGNOSIS — R946 Abnormal results of thyroid function studies: Secondary | ICD-10-CM | POA: Diagnosis not present

## 2019-04-23 DIAGNOSIS — R05 Cough: Secondary | ICD-10-CM | POA: Diagnosis not present

## 2019-04-23 DIAGNOSIS — J309 Allergic rhinitis, unspecified: Secondary | ICD-10-CM | POA: Diagnosis not present

## 2019-05-21 DIAGNOSIS — L570 Actinic keratosis: Secondary | ICD-10-CM | POA: Diagnosis not present

## 2019-05-21 DIAGNOSIS — L814 Other melanin hyperpigmentation: Secondary | ICD-10-CM | POA: Diagnosis not present

## 2019-05-21 DIAGNOSIS — L821 Other seborrheic keratosis: Secondary | ICD-10-CM | POA: Diagnosis not present

## 2019-05-21 DIAGNOSIS — B353 Tinea pedis: Secondary | ICD-10-CM | POA: Diagnosis not present

## 2019-05-21 DIAGNOSIS — D225 Melanocytic nevi of trunk: Secondary | ICD-10-CM | POA: Diagnosis not present

## 2019-05-21 DIAGNOSIS — Z85820 Personal history of malignant melanoma of skin: Secondary | ICD-10-CM | POA: Diagnosis not present

## 2019-05-21 DIAGNOSIS — Z85828 Personal history of other malignant neoplasm of skin: Secondary | ICD-10-CM | POA: Diagnosis not present

## 2019-08-07 DIAGNOSIS — M1990 Unspecified osteoarthritis, unspecified site: Secondary | ICD-10-CM | POA: Diagnosis not present

## 2019-08-07 DIAGNOSIS — R946 Abnormal results of thyroid function studies: Secondary | ICD-10-CM | POA: Diagnosis not present

## 2019-08-07 DIAGNOSIS — N1832 Chronic kidney disease, stage 3b: Secondary | ICD-10-CM | POA: Diagnosis not present

## 2019-08-07 DIAGNOSIS — E7800 Pure hypercholesterolemia, unspecified: Secondary | ICD-10-CM | POA: Diagnosis not present

## 2019-08-07 DIAGNOSIS — I10 Essential (primary) hypertension: Secondary | ICD-10-CM | POA: Diagnosis not present

## 2019-08-07 DIAGNOSIS — Z8546 Personal history of malignant neoplasm of prostate: Secondary | ICD-10-CM | POA: Diagnosis not present

## 2019-08-07 DIAGNOSIS — Z0000 Encounter for general adult medical examination without abnormal findings: Secondary | ICD-10-CM | POA: Diagnosis not present

## 2019-09-20 DIAGNOSIS — Z8546 Personal history of malignant neoplasm of prostate: Secondary | ICD-10-CM | POA: Diagnosis not present

## 2019-09-20 DIAGNOSIS — I10 Essential (primary) hypertension: Secondary | ICD-10-CM | POA: Diagnosis not present

## 2019-09-20 DIAGNOSIS — Z87891 Personal history of nicotine dependence: Secondary | ICD-10-CM | POA: Diagnosis not present

## 2019-09-20 DIAGNOSIS — Z23 Encounter for immunization: Secondary | ICD-10-CM | POA: Diagnosis not present

## 2019-09-20 DIAGNOSIS — E7800 Pure hypercholesterolemia, unspecified: Secondary | ICD-10-CM | POA: Diagnosis not present

## 2019-09-20 DIAGNOSIS — Z125 Encounter for screening for malignant neoplasm of prostate: Secondary | ICD-10-CM | POA: Diagnosis not present

## 2019-09-20 DIAGNOSIS — R946 Abnormal results of thyroid function studies: Secondary | ICD-10-CM | POA: Diagnosis not present

## 2019-11-08 DIAGNOSIS — Z8546 Personal history of malignant neoplasm of prostate: Secondary | ICD-10-CM | POA: Diagnosis not present

## 2019-11-08 DIAGNOSIS — E7800 Pure hypercholesterolemia, unspecified: Secondary | ICD-10-CM | POA: Diagnosis not present

## 2019-11-08 DIAGNOSIS — M1990 Unspecified osteoarthritis, unspecified site: Secondary | ICD-10-CM | POA: Diagnosis not present

## 2019-11-08 DIAGNOSIS — I10 Essential (primary) hypertension: Secondary | ICD-10-CM | POA: Diagnosis not present

## 2019-11-13 DIAGNOSIS — I10 Essential (primary) hypertension: Secondary | ICD-10-CM | POA: Diagnosis not present

## 2019-12-09 DIAGNOSIS — E039 Hypothyroidism, unspecified: Secondary | ICD-10-CM | POA: Diagnosis not present

## 2019-12-09 DIAGNOSIS — G5603 Carpal tunnel syndrome, bilateral upper limbs: Secondary | ICD-10-CM | POA: Diagnosis not present

## 2019-12-09 DIAGNOSIS — I10 Essential (primary) hypertension: Secondary | ICD-10-CM | POA: Diagnosis not present

## 2019-12-09 DIAGNOSIS — E7800 Pure hypercholesterolemia, unspecified: Secondary | ICD-10-CM | POA: Diagnosis not present

## 2019-12-09 DIAGNOSIS — I499 Cardiac arrhythmia, unspecified: Secondary | ICD-10-CM | POA: Diagnosis not present

## 2019-12-11 ENCOUNTER — Encounter: Payer: Self-pay | Admitting: *Deleted

## 2019-12-13 DIAGNOSIS — G5603 Carpal tunnel syndrome, bilateral upper limbs: Secondary | ICD-10-CM | POA: Diagnosis not present

## 2019-12-13 DIAGNOSIS — I10 Essential (primary) hypertension: Secondary | ICD-10-CM | POA: Diagnosis not present

## 2019-12-15 DIAGNOSIS — I10 Essential (primary) hypertension: Secondary | ICD-10-CM | POA: Diagnosis not present

## 2019-12-15 DIAGNOSIS — E7800 Pure hypercholesterolemia, unspecified: Secondary | ICD-10-CM | POA: Diagnosis not present

## 2019-12-15 DIAGNOSIS — Z8546 Personal history of malignant neoplasm of prostate: Secondary | ICD-10-CM | POA: Diagnosis not present

## 2019-12-15 DIAGNOSIS — E039 Hypothyroidism, unspecified: Secondary | ICD-10-CM | POA: Diagnosis not present

## 2019-12-15 DIAGNOSIS — M1990 Unspecified osteoarthritis, unspecified site: Secondary | ICD-10-CM | POA: Diagnosis not present

## 2019-12-16 ENCOUNTER — Ambulatory Visit: Payer: PPO | Admitting: Internal Medicine

## 2019-12-16 ENCOUNTER — Encounter: Payer: Self-pay | Admitting: Internal Medicine

## 2019-12-16 ENCOUNTER — Other Ambulatory Visit: Payer: Self-pay

## 2019-12-16 VITALS — BP 170/70 | HR 69 | Ht 67.5 in | Wt 212.8 lb

## 2019-12-16 DIAGNOSIS — I10 Essential (primary) hypertension: Secondary | ICD-10-CM

## 2019-12-16 DIAGNOSIS — R002 Palpitations: Secondary | ICD-10-CM | POA: Insufficient documentation

## 2019-12-16 DIAGNOSIS — I493 Ventricular premature depolarization: Secondary | ICD-10-CM

## 2019-12-16 DIAGNOSIS — I447 Left bundle-branch block, unspecified: Secondary | ICD-10-CM | POA: Diagnosis not present

## 2019-12-16 NOTE — Progress Notes (Signed)
New Outpatient Visit Date: 12/16/2019  Referring Provider: Shirline Frees, MD [address] [place],   [address]  Chief Complaint: Irregular heart beat  HPI:  Tyler David is a 74 y.o. male who is being seen today for the evaluation of irregular heartbeat at the request of Dr. Kenton Kingfisher. He has a history of hypertension, hyperlipidemia, hypothyroidism, prostate cancer, chronic kidney disease stage III, GERD, and migraine headaches.  Over the last year, Tyler David has experienced intermittent palpitations that he describes as a skipped or irregular beats that he can feel in his neck.  This resolved for a few months but then returned.  He has also noticed that his blood pressure cuff intermittently indicates an irregular heart rate.  There are no associated symptoms.  During recent evaluation by Dr. Kenton Kingfisher, new left bundle branch block was noted.  Tyler David denies chest pain, shortness of breath, and lightheadedness.  He has occasional dependent edema.  He notes that he has put on some weight during the COVID-19 pandemic, as he has been more sedentary.  He also notes having had migratory pains in his legs following statin therapy.  He discontinued rosuvastatin about 2 weeks ago and notes significant improvement in the the discomfort.  --------------------------------------------------------------------------------------------------  Cardiovascular History & Procedures: Cardiovascular Problems:  Palpitations with EKG demonstrating PVCs  Risk Factors:  Hypertension, hyperlipidemia, male gender, obesity, and age greater than 23  Cath/PCI:  None  CV Surgery:  None  EP Procedures and Devices:  None  Non-Invasive Evaluation(s):  None  Recent CV Pertinent Labs: Lab Results  Component Value Date   INR 1.03 10/07/2015   K 3.9 10/17/2015   K 3.9 09/16/2014   BUN 30 (H) 10/17/2015   BUN 22 (H) 09/16/2014   CREATININE 1.57 (H) 10/17/2015   CREATININE 1.35  (H) 09/16/2014    --------------------------------------------------------------------------------------------------  Past Medical History:  Diagnosis Date  . Arthritis   . Cancer [facility])    prostate cancer   . Chronic kidney disease, stage 3   . ED (erectile dysfunction)   . GERD (gastroesophageal reflux disease)   . Headache    hx of migraine-2015   . Hyperlipidemia   . Hypertension   . Migraine   . Nocturia   . Overactive bladder   . Pneumonia    hx of at age 62     Past Surgical History:  Procedure Laterality Date  . PROSTATE SURGERY    . 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   Procedure: LEFT TOTAL HIP ARTHROPLASTY ANTERIOR APPROACH;  Surgeon: Paralee Cancel, MD;  Location: [facility] ORS;  Service: Orthopedics;  Laterality: Left;    Current Meds  Medication Sig  . amLODipine (NORVASC) 5 MG tablet Take 5 mg by mouth daily.  . Black Elderberry 50 MG/5ML SYRP Take by mouth as directed.  . Cholecalciferol (VITAMIN D) 125 MCG (5000 UT) CAPS Take by mouth daily.  . hydrochlorothiazide (HYDRODIURIL) 25 MG tablet Take 25 mg by mouth daily.  Marland Kitchen levothyroxine (SYNTHROID) 25 MCG tablet Take 25 mcg by mouth daily before breakfast.  . Magnesium 250 MG TABS Take by mouth daily.  . metoprolol succinate (TOPROL-XL) 100 MG 24 hr tablet Take 100 mg by mouth at bedtime. Take with or immediately following a meal.  . Omega-3 Fatty Acids (FISH OIL) 1000 MG CAPS Take by mouth daily.  Marland Kitchen telmisartan (MICARDIS) 80 MG tablet Take 80 mg by mouth daily.  . Turmeric 500 MG CAPS Take by mouth in the morning, at  noon, and at bedtime.  . Zinc 10 MG LOZG Use as directed in the mouth or throat daily.    Allergies: Lipitor [atorvastatin], Pravachol [pravastatin], Simvastatin, Statins, and Penicillins  Social History   Tobacco Use  . Smoking status: Never Smoker  . Smokeless tobacco: Never Used  Substance Use Topics  . Alcohol use: Yes    Comment: rare  . Drug use: No    Family History  Problem  Relation Age of Onset  . Ovarian cancer Mother   . Heart failure Father   . COPD Father   . Atrial fibrillation Brother   . Prostate cancer Brother     Review of Systems: A 12-system review of systems was performed and was negative except as noted in the HPI.  --------------------------------------------------------------------------------------------------  Physical Exam: BP (!) 170/70 (BP Location: Right Arm, Patient Position: Sitting, Cuff Size: Normal)   Pulse 69   Ht 5' 7.5" (1.715 m)   Wt 212 lb 12 oz (96.5 kg)   SpO2 97%   BMI 32.83 kg/m   General: NAD. HEENT: No conjunctival pallor or scleral icterus. Facemask in place. Neck: Supple without lymphadenopathy, thyromegaly, JVD, or HJR. No carotid bruit. Lungs: Normal work of breathing. Clear to auscultation bilaterally without wheezes or crackles. Heart: Regular rate and rhythm without murmurs, rubs, or gallops. Non-displaced PMI. Abd: Bowel sounds present. Soft, NT/ND without hepatosplenomegaly Ext: Trace pretibial edema bilaterally. Radial, PT, and DP pulses are 2+ bilaterally Skin: Warm and dry without rash. Neuro: CNIII-XII intact. Strength and fine-touch sensation intact in upper and lower extremities bilaterally. Psych: Normal mood and affect.  EKG: Normal sinus rhythm with single PVC.  Left axis deviation with left bundle branch block.  Outside labs: TSH (12/09/2019): 4.6 Free T4 (12/09/2019): 0.99  CBC (09/20/2019): WBC 7.5, hemoglobin 14.2, hematocrit 42.2, platelets 234  CMP (09/20/2019): Sodium 141 potassium 4.3, chloride 101, CO2 31, BUN 31, creatinine 1.59, glucose 73, calcium 9.8, AST 23, ALT 30, alkaline phosphatase 42, total bilirubin 0.8, total protein 6.9, albumin 4.3.  --------------------------------------------------------------------------------------------------  ASSESSMENT AND PLAN: Palpitations and PVC's: Symptoms most consistent with nonsustained arrhythmia such as PACs or PVCs.  EKG today  demonstrates single PVC.  When sent labs were largely unrevealing other than mildly elevated TSH, which is being managed by Dr. Kenton Kingfisher.  We discussed obtaining ambulatory event monitor for further characterization, though Tyler David wishes to defer this.  We will continue metoprolol succinate 100 mg daily and obtain a transthoracic echocardiogram to exclude significant structural abnormality, particularly in the setting of newly diagnosed left bundle branch block.  Left bundle branch block: Incidentally noted during recent visit with Dr. Kenton Kingfisher.  It is again present today.  We will begin with a transthoracic echocardiogram to exclude underlying cardiomyopathy.  Based on results, we will determine the next step for ischemia evaluation (cardiac CTA or pharmacologic myocardial perfusion stress test versus catheterization).  We will defer medication changes at this time.  Hypertension: Blood pressure moderately elevated today.  We have discussed lifestyle modifications including limiting sodium intake.  I have provided Tyler David with information regarding the DASH diet.  We will defer medication changes at this time.  If his pressure remains significantly elevated, will need to titrate his pharmacotherapy consider work-up for secondary hypertension down the road.  Follow-up: Return to clinic in 3 months.  Nelva Bush, MD 12/16/2019 10:38 PM

## 2019-12-16 NOTE — Patient Instructions (Signed)
Medication Instructions:  Your physician recommends that you continue on your current medications as directed. Please refer to the Current Medication list given to you today.  *If you need a refill on your cardiac medications before your next appointment, please call your pharmacy*  Lab Work: none If you have labs (blood work) drawn today and your tests are completely normal, you will receive your results only by: Marland Kitchen MyChart Message (if you have MyChart) OR . A paper copy in the mail If you have any lab test that is abnormal or we need to change your treatment, we will call you to review the results.  Testing/Procedures: Your physician has requested that you have an echocardiogram. Echocardiography is a painless test that uses sound waves to create images of your heart. It provides your doctor with information about the size and shape of your heart and how well your heart's chambers and valves are working. This procedure takes approximately one hour. There are no restrictions for this procedure. You may get an IV, if needed, to receive an ultrasound enhancing agent through to better visualize your heart.   Follow-Up: At [facility], you and your health needs are our priority.  As part of our continuing mission to provide you with exceptional heart care, we have created designated Provider Care Teams.  These Care Teams include your primary Cardiologist (physician) and Advanced Practice Providers (APPs -  Physician Assistants and Nurse Practitioners) who all work together to provide you with the care you need, when you need it.  We recommend signing up for the patient portal called "MyChart".  Sign up information is provided on this After Visit Summary.  MyChart is used to connect with patients for Virtual Visits (Telemedicine).  Patients are able to view lab/test results, encounter notes, upcoming appointments, etc.  Non-urgent messages can be sent to your provider as well.   To learn more about  what you can do with MyChart, go to [email or web address].    Your next appointment:   3 month(s)  The format for your next appointment:   In Person  Provider:    You may see DR Harrell Gave END or one of the following Advanced Practice Providers on your designated Care Team:    Murray Hodgkins, NP  Christell Faith, PA-C Marrianne Mood, PA-C   Echocardiogram An echocardiogram is a procedure that uses painless sound waves (ultrasound) to produce an image of the heart. Images from an echocardiogram can provide important information about:  Signs of coronary artery disease (CAD).  Aneurysm detection. An aneurysm is a weak or damaged part of an artery wall that bulges out from the normal force of blood pumping through the body.  Heart size and shape. Changes in the size or shape of the heart can be associated with certain conditions, including heart failure, aneurysm, and CAD.  Heart muscle function.  Heart valve function.  Signs of a past heart attack.  Fluid buildup around the heart.  Thickening of the heart muscle.  A tumor or infectious growth around the heart valves. Tell a health care provider about:  Any allergies you have.  All medicines you are taking, including vitamins, herbs, eye drops, creams, and over-the-counter medicines.  Any blood disorders you have.  Any surgeries you have had.  Any medical conditions you have.  Whether you are pregnant or may be pregnant. What are the risks? Generally, this is a safe procedure. However, problems may occur, including:  Allergic reaction to dye (contrast) that may be used  during the procedure. What happens before the procedure? No specific preparation is needed. You may eat and drink normally. What happens during the procedure?   An IV tube may be inserted into one of your veins.  You may receive contrast through this tube. A contrast is an injection that improves the quality of the pictures from your heart.   A gel will be applied to your chest.  A wand-like tool (transducer) will be moved over your chest. The gel will help to transmit the sound waves from the transducer.  The sound waves will harmlessly bounce off of your heart to allow the heart images to be captured in real-time motion. The images will be recorded on a computer. The procedure may vary among health care providers and hospitals. What happens after the procedure?  You may return to your normal, everyday life, including diet, activities, and medicines, unless your health care provider tells you not to do that. Summary  An echocardiogram is a procedure that uses painless sound waves (ultrasound) to produce an image of the heart.  Images from an echocardiogram can provide important information about the size and shape of your heart, heart muscle function, heart valve function, and fluid buildup around your heart.  You do not need to do anything to prepare before this procedure. You may eat and drink normally.  After the echocardiogram is completed, you may return to your normal, everyday life, unless your health care provider tells you not to do that. This information is not intended to replace advice given to you by your health care provider. Make sure you discuss any questions you have with your health care provider. Document Revised: 01/24/2019 Document Reviewed: 11/05/2016 Elsevier Patient Education  [place].

## 2019-12-25 DIAGNOSIS — G5603 Carpal tunnel syndrome, bilateral upper limbs: Secondary | ICD-10-CM | POA: Diagnosis not present

## 2019-12-27 DIAGNOSIS — G5603 Carpal tunnel syndrome, bilateral upper limbs: Secondary | ICD-10-CM | POA: Diagnosis not present

## 2020-01-09 ENCOUNTER — Ambulatory Visit: Payer: PPO | Attending: Internal Medicine

## 2020-01-09 ENCOUNTER — Other Ambulatory Visit: Payer: Self-pay

## 2020-01-09 DIAGNOSIS — Z23 Encounter for immunization: Secondary | ICD-10-CM

## 2020-01-09 DIAGNOSIS — E7800 Pure hypercholesterolemia, unspecified: Secondary | ICD-10-CM | POA: Diagnosis not present

## 2020-01-09 DIAGNOSIS — M1990 Unspecified osteoarthritis, unspecified site: Secondary | ICD-10-CM | POA: Diagnosis not present

## 2020-01-09 DIAGNOSIS — Z8546 Personal history of malignant neoplasm of prostate: Secondary | ICD-10-CM | POA: Diagnosis not present

## 2020-01-09 DIAGNOSIS — E039 Hypothyroidism, unspecified: Secondary | ICD-10-CM | POA: Diagnosis not present

## 2020-01-09 DIAGNOSIS — N183 Chronic kidney disease, stage 3 unspecified: Secondary | ICD-10-CM | POA: Diagnosis not present

## 2020-01-09 DIAGNOSIS — N1832 Chronic kidney disease, stage 3b: Secondary | ICD-10-CM | POA: Diagnosis not present

## 2020-01-09 DIAGNOSIS — I10 Essential (primary) hypertension: Secondary | ICD-10-CM | POA: Diagnosis not present

## 2020-01-09 NOTE — Progress Notes (Signed)
   Covid-19 Vaccination Clinic  Name:  Tyler David    MRN: [id number] DOB: 29-Dec-1945  01/09/2020  [name] was observed post Covid-19 immunization for 15 minutes without incident. He was provided with Vaccine Information Sheet and instruction to access the V-Safe system.   [name] was instructed to call 911 with any severe reactions post vaccine: Marland Kitchen Difficulty breathing  . Swelling of face and throat  . A fast heartbeat  . A bad rash all over body  . Dizziness and weakness   Immunizations Administered    Name Date Dose VIS Date Route   Pfizer COVID-19 Vaccine 01/09/2020  8:35 AM 0.3 mL 09/27/2019 Intramuscular   Manufacturer: [place]   Lot: [id number]   [place]: [id number]

## 2020-01-15 DIAGNOSIS — I10 Essential (primary) hypertension: Secondary | ICD-10-CM | POA: Diagnosis not present

## 2020-01-24 ENCOUNTER — Ambulatory Visit (INDEPENDENT_AMBULATORY_CARE_PROVIDER_SITE_OTHER): Payer: PPO

## 2020-01-24 ENCOUNTER — Other Ambulatory Visit: Payer: Self-pay

## 2020-01-24 DIAGNOSIS — I493 Ventricular premature depolarization: Secondary | ICD-10-CM | POA: Diagnosis not present

## 2020-01-24 DIAGNOSIS — I447 Left bundle-branch block, unspecified: Secondary | ICD-10-CM | POA: Diagnosis not present

## 2020-01-24 MED ORDER — PERFLUTREN LIPID MICROSPHERE
1.0000 mL | INTRAVENOUS | Status: AC | PRN
  Administered 2020-01-24: 2 mL via INTRAVENOUS

## 2020-01-29 ENCOUNTER — Telehealth: Payer: Self-pay | Admitting: *Deleted

## 2020-01-29 DIAGNOSIS — R002 Palpitations: Secondary | ICD-10-CM

## 2020-01-29 DIAGNOSIS — I447 Left bundle-branch block, unspecified: Secondary | ICD-10-CM

## 2020-01-29 NOTE — Telephone Encounter (Signed)
Results called to pt. Pt verbalized understanding. Patient agreeable to [facility]. He verbalized understanding of the instructions below.  Instructions also sent to patient via MyChart.  He will let us know if any further questions.   [place]  Your caregiver has ordered a Stress Test with nuclear imaging. The purpose of this test is to evaluate the blood supply to your heart muscle. This procedure is referred to as a "Non-Invasive Stress Test." This is because other than having an IV started in your vein, nothing is inserted or "invades" your body. Cardiac stress tests are done to find areas of poor blood flow to the heart by determining the extent of coronary artery disease (CAD). Some patients exercise on a treadmill, which naturally increases the blood flow to your heart, while others who are  unable to walk on a treadmill due to physical limitations have a pharmacologic/chemical stress agent called Lexiscan . This medicine will mimic walking on a treadmill by temporarily increasing your coronary blood flow.   Please note: these test may take anywhere between 2-4 hours to complete  PLEASE REPORT TO [facility] AT THE FIRST DESK WILL DIRECT YOU WHERE TO GO  Date of Procedure:_______04/26/21______________  Arrival Time for Procedure:_______07:45 am____________  Instructions regarding medication:   _xx_:  Hold other medications as follows:____________Hydrochlorothiazide________________________________  PLEASE NOTIFY THE OFFICE AT LEAST 24 HOURS IN ADVANCE IF YOU ARE UNABLE TO KEEP YOUR APPOINTMENT.  [phone] AND  PLEASE NOTIFY NUCLEAR MEDICINE AT [facility] AT LEAST 24 HOURS IN ADVANCE IF YOU ARE UNABLE TO KEEP YOUR APPOINTMENT. [phone]  How to prepare for your Myoview test:  1. Do not eat or drink after midnight 2. No caffeine for 24 hours prior to test 3. No smoking 24 hours prior to test. 4. Your medication may be taken with water.  If  your doctor stopped a medication because of this test, do not take that medication. 5. Ladies, please do not wear dresses.  Skirts or pants are appropriate. Please wear a short sleeve shirt. 6. No perfume, cologne or lotion. 7. Wear comfortable walking shoes. No heels!

## 2020-01-29 NOTE — Telephone Encounter (Signed)
-----   Message from Nelva Bush, MD sent at 01/28/2020  4:21 PM EDT ----- Please let Tyler David know that his echocardiogram shows that his heart is contracting normally.  I recommend that we obtain a pharmacologic myocardial perfusion stress test for further evaluation of his palpitations and newly diagnosed left bundle branch block.  We will follow-up with him after completion of the stress test.

## 2020-02-04 DIAGNOSIS — H353132 Nonexudative age-related macular degeneration, bilateral, intermediate dry stage: Secondary | ICD-10-CM | POA: Diagnosis not present

## 2020-02-05 ENCOUNTER — Ambulatory Visit: Payer: PPO | Attending: Internal Medicine

## 2020-02-05 DIAGNOSIS — Z23 Encounter for immunization: Secondary | ICD-10-CM

## 2020-02-05 NOTE — Progress Notes (Signed)
   Covid-19 Vaccination Clinic  Name:  Tyler David    MRN: [id number] DOB: 07-07-1946  02/05/2020  Mr. Tyler David was observed post Covid-19 immunization for 15 minutes without incident. He was provided with Vaccine Information Sheet and instruction to access the V-Safe system.   Mr. Tyler David was instructed to call 911 with any severe reactions post vaccine: Marland Kitchen Difficulty breathing  . Swelling of face and throat  . A fast heartbeat  . A bad rash all over body  . Dizziness and weakness   Immunizations Administered    Name Date Dose VIS Date Route   Pfizer COVID-19 Vaccine 02/05/2020  8:17 AM 0.3 mL 12/11/2018 Intramuscular   Manufacturer: [place]   Lot: [id number]   [place]: [id number]

## 2020-02-10 ENCOUNTER — Encounter
Admission: RE | Admit: 2020-02-10 | Discharge: 2020-02-10 | Disposition: A | Discharge status: Home / Self Care | Payer: PPO | Source: Ambulatory Visit | Attending: Internal Medicine | Admitting: Internal Medicine

## 2020-02-10 ENCOUNTER — Other Ambulatory Visit: Payer: Self-pay

## 2020-02-10 DIAGNOSIS — R002 Palpitations: Secondary | ICD-10-CM

## 2020-02-10 DIAGNOSIS — I447 Left bundle-branch block, unspecified: Secondary | ICD-10-CM | POA: Diagnosis not present

## 2020-02-10 LAB — NM MYOCAR MULTI W/SPECT W/WALL MOTION / EF
Estimated workload: 1 METS
Exercise duration (min): 0 min
Exercise duration (sec): 0 s
LV dias vol: 121 mL (ref 62–150)
LV sys vol: 52 mL
MPHR: 146 {beats}/min
Peak HR: 80 {beats}/min
Percent HR: 54 %
Rest HR: 60 {beats}/min
SDS: 0
SRS: 11
SSS: 7
TID: 1.14

## 2020-02-10 MED ORDER — TECHNETIUM TC 99M TETROFOSMIN IV KIT
30.0000 | PACK | Freq: Once | INTRAVENOUS | Status: AC | PRN
  Administered 2020-02-10: 29.9 via INTRAVENOUS

## 2020-02-10 MED ORDER — REGADENOSON 0.4 MG/5ML IV SOLN
0.4000 mg | Freq: Once | INTRAVENOUS | Status: AC
  Administered 2020-02-10: 0.4 mg via INTRAVENOUS

## 2020-02-10 MED ORDER — TECHNETIUM TC 99M TETROFOSMIN IV KIT
10.2500 | PACK | Freq: Once | INTRAVENOUS | Status: AC | PRN
  Administered 2020-02-10: 10.25 via INTRAVENOUS

## 2020-02-13 DIAGNOSIS — Z8546 Personal history of malignant neoplasm of prostate: Secondary | ICD-10-CM | POA: Diagnosis not present

## 2020-02-13 DIAGNOSIS — M1990 Unspecified osteoarthritis, unspecified site: Secondary | ICD-10-CM | POA: Diagnosis not present

## 2020-02-13 DIAGNOSIS — E7800 Pure hypercholesterolemia, unspecified: Secondary | ICD-10-CM | POA: Diagnosis not present

## 2020-02-13 DIAGNOSIS — I10 Essential (primary) hypertension: Secondary | ICD-10-CM | POA: Diagnosis not present

## 2020-02-13 DIAGNOSIS — N1832 Chronic kidney disease, stage 3b: Secondary | ICD-10-CM | POA: Diagnosis not present

## 2020-02-13 DIAGNOSIS — N183 Chronic kidney disease, stage 3 unspecified: Secondary | ICD-10-CM | POA: Diagnosis not present

## 2020-02-13 DIAGNOSIS — E039 Hypothyroidism, unspecified: Secondary | ICD-10-CM | POA: Diagnosis not present

## 2020-03-09 DIAGNOSIS — E039 Hypothyroidism, unspecified: Secondary | ICD-10-CM | POA: Diagnosis not present

## 2020-03-12 DIAGNOSIS — I10 Essential (primary) hypertension: Secondary | ICD-10-CM | POA: Diagnosis not present

## 2020-03-15 DIAGNOSIS — I10 Essential (primary) hypertension: Secondary | ICD-10-CM | POA: Diagnosis not present

## 2020-03-15 DIAGNOSIS — N183 Chronic kidney disease, stage 3 unspecified: Secondary | ICD-10-CM | POA: Diagnosis not present

## 2020-03-15 DIAGNOSIS — N1832 Chronic kidney disease, stage 3b: Secondary | ICD-10-CM | POA: Diagnosis not present

## 2020-03-15 DIAGNOSIS — M1990 Unspecified osteoarthritis, unspecified site: Secondary | ICD-10-CM | POA: Diagnosis not present

## 2020-03-15 DIAGNOSIS — E7800 Pure hypercholesterolemia, unspecified: Secondary | ICD-10-CM | POA: Diagnosis not present

## 2020-03-15 DIAGNOSIS — E039 Hypothyroidism, unspecified: Secondary | ICD-10-CM | POA: Diagnosis not present

## 2020-03-15 DIAGNOSIS — Z8546 Personal history of malignant neoplasm of prostate: Secondary | ICD-10-CM | POA: Diagnosis not present

## 2020-04-06 ENCOUNTER — Other Ambulatory Visit: Payer: Self-pay

## 2020-04-06 ENCOUNTER — Ambulatory Visit: Payer: PPO | Admitting: Internal Medicine

## 2020-04-06 ENCOUNTER — Encounter: Payer: Self-pay | Admitting: Internal Medicine

## 2020-04-06 VITALS — BP 136/72 | HR 65 | Ht 67.0 in | Wt 196.4 lb

## 2020-04-06 DIAGNOSIS — I447 Left bundle-branch block, unspecified: Secondary | ICD-10-CM

## 2020-04-06 DIAGNOSIS — I10 Essential (primary) hypertension: Secondary | ICD-10-CM | POA: Diagnosis not present

## 2020-04-06 DIAGNOSIS — I493 Ventricular premature depolarization: Secondary | ICD-10-CM | POA: Diagnosis not present

## 2020-04-06 DIAGNOSIS — R002 Palpitations: Secondary | ICD-10-CM

## 2020-04-06 NOTE — Patient Instructions (Signed)
Medication Instructions:  Your physician recommends that you continue on your current medications as directed. Please refer to the Current Medication list given to you today.  *If you need a refill on your cardiac medications before your next appointment, please call your pharmacy*  Follow-Up: At [facility], you and your health needs are our priority.  As part of our continuing mission to provide you with exceptional heart care, we have created designated Provider Care Teams.  These Care Teams include your primary Cardiologist (physician) and Advanced Practice Providers (APPs -  Physician Assistants and Nurse Practitioners) who all work together to provide you with the care you need, when you need it.  We recommend signing up for the patient portal called "MyChart".  Sign up information is provided on this After Visit Summary.  MyChart is used to connect with patients for Virtual Visits (Telemedicine).  Patients are able to view lab/test results, encounter notes, upcoming appointments, etc.  Non-urgent messages can be sent to your provider as well.   To learn more about what you can do with MyChart, go to [email or web address].    Your next appointment:   6 month(s)  The format for your next appointment:   In Person  Provider:    You may see DR CHRISTOPHER END or one of the following Advanced Practice Providers on your designated Care Team:    Christopher Berge, NP  Ryan Dunn, PA-C  Jacquelyn Visser, PA-C   

## 2020-04-06 NOTE — Progress Notes (Signed)
Follow-up Outpatient Visit Date: 04/06/2020  Primary Care Provider: Shirline Frees, MD [place] [address]  Chief Complaint: Follow-up hypertension and left bundle branch block  HPI:  Tyler David is a 74 y.o. male with history of hypertension, hyperlipidemia, hypothyroidism, prostate cancer, chronic kidney disease stage III, GERD, and migraine headaches who presents for follow-up of irregular heartbeat.  I met Tyler David in March for evaluation of an irregular heartbeat and newly diagnosed left bundle branch block.  Tyler David was asymptomatic at the time other than sporadic palpitations.  Today, Tyler David reports that he is feeling better.  He happily reports having lost about 20 pounds and also having improved his blood pressure with dietary changes.  He initially felt fatigued when he cut out all salt, though he seems to have found a balance right now.  He said he did not have any further irregular heartbeats up until this morning.  He has felt a little lightheaded or dizzy the last couple of days, which she attributes to allergies.  He denies chest pain, shortness of breath, and edema.  --------------------------------------------------------------------------------------------------  Cardiovascular History & Procedures: Cardiovascular Problems:  Palpitations with EKG demonstrating PVCs  Risk Factors:  Hypertension, hyperlipidemia, male gender, obesity, and age greater than 61  Cath/PCI:  None  CV Surgery:  None  EP Procedures and Devices:  None  Non-Invasive Evaluation(s):  Pharmacologic MPI (02/10/2020): Normal study without ischemia or scar.  LVEF 55-65%.  TTE (01/24/2020): Normal LV size with mild LVH.  LVEF 55-60% with normal wall motion and grade 1 diastolic dysfunction.  Normal RV size and function.  Mild pulmonary hypertension.  Mild aortic regurgitation.  Recent CV Pertinent Labs: Lab Results  Component Value Date    INR 1.03 10/07/2015   K 3.9 10/17/2015   K 3.9 09/16/2014   BUN 30 (H) 10/17/2015   BUN 22 (H) 09/16/2014   CREATININE 1.57 (H) 10/17/2015   CREATININE 1.35 (H) 09/16/2014    Past medical and surgical history were reviewed and updated in EPIC.  Current Meds  Medication Sig  . amLODipine (NORVASC) 5 MG tablet Take 5 mg by mouth daily.  . Cholecalciferol (VITAMIN D) 125 MCG (5000 UT) CAPS Take by mouth daily.  . hydrochlorothiazide (HYDRODIURIL) 25 MG tablet Take 25 mg by mouth daily.  Marland Kitchen levothyroxine (SYNTHROID) 25 MCG tablet Take 25 mcg by mouth daily before breakfast.  . Magnesium 250 MG TABS Take by mouth daily.  . metoprolol succinate (TOPROL-XL) 100 MG 24 hr tablet Take 100 mg by mouth at bedtime. Take with or immediately following a meal.  . Omega-3 Fatty Acids (FISH OIL) 1000 MG CAPS Take by mouth daily.  Marland Kitchen telmisartan (MICARDIS) 80 MG tablet Take 80 mg by mouth daily.  . Turmeric 500 MG CAPS Take by mouth in the morning, at noon, and at bedtime.    Allergies: Lipitor [atorvastatin], Pravachol [pravastatin], Simvastatin, Statins, and Penicillins  Social History   Tobacco Use  . Smoking status: Never Smoker  . Smokeless tobacco: Never Used  Substance Use Topics  . Alcohol use: Yes    Comment: rare  . Drug use: No    Family History  Problem Relation Age of Onset  . Ovarian cancer Mother   . Heart failure Father   . COPD Father   . Atrial fibrillation Brother   . Prostate cancer Brother     Review of Systems: A 12-system review of systems was performed and was negative except as  noted in the HPI.  --------------------------------------------------------------------------------------------------  Physical Exam: Ht '5\' 7"'$  (1.702 m)   Wt 196 lb 6 oz (89.1 kg)   BMI 30.76 kg/m   General: NAD. HEENT: No conjunctival pallor or scleral icterus. Facemask in place. Neck: No JVD or HJR. Lungs: Normal work of breathing. Clear to auscultation bilaterally without  wheezes or crackles. Heart: Regular rate and rhythm without murmurs, rubs, or gallops. Abd: Bowel sounds present. Soft, NT/ND. Ext: No lower extremity edema.  EKG:  Normal sinus rhythm with left axis deviation and left bundle branch block.  PVCs are no longer present.  Otherwise, no significant change since 12/16/2019.  Lab Results  Component Value Date   WBC 13.5 (H) 10/17/2015   HGB 12.6 (L) 10/17/2015   HCT 37.0 (L) 10/17/2015   MCV 89.8 10/17/2015   PLT 200 10/17/2015    Lab Results  Component Value Date   NA 137 10/17/2015   K 3.9 10/17/2015   CL 99 (L) 10/17/2015   CO2 30 10/17/2015   BUN 30 (H) 10/17/2015   CREATININE 1.57 (H) 10/17/2015   GLUCOSE 145 (H) 10/17/2015    --------------------------------------------------------------------------------------------------  ASSESSMENT AND PLAN: Palpitations and PVCs: Tyler David reports minimal irregularity in his heartbeat since our last visit.  Examination and EKG today are without ectopy.  We will defer further work-up and testing at this time, given lack of associated symptoms as well as reassuring echocardiogram and myocardial perfusion stress test.  Left bundle branch block: Unchanged.  Continue clinical/EKG follow-up.  Hypertension: Blood pressure much improved.  I congratulated Tyler David on reducing his sodium intake and losing weight.  He should continue work with this.  No medication changes at this time.  Follow-up: Return to clinic in 6 months.  Nelva Bush, MD 04/06/2020 10:38 AM

## 2020-04-15 DIAGNOSIS — N183 Chronic kidney disease, stage 3 unspecified: Secondary | ICD-10-CM | POA: Diagnosis not present

## 2020-04-15 DIAGNOSIS — I10 Essential (primary) hypertension: Secondary | ICD-10-CM | POA: Diagnosis not present

## 2020-04-15 DIAGNOSIS — E039 Hypothyroidism, unspecified: Secondary | ICD-10-CM | POA: Diagnosis not present

## 2020-04-15 DIAGNOSIS — N1832 Chronic kidney disease, stage 3b: Secondary | ICD-10-CM | POA: Diagnosis not present

## 2020-04-15 DIAGNOSIS — E7800 Pure hypercholesterolemia, unspecified: Secondary | ICD-10-CM | POA: Diagnosis not present

## 2020-04-15 DIAGNOSIS — Z8546 Personal history of malignant neoplasm of prostate: Secondary | ICD-10-CM | POA: Diagnosis not present

## 2020-04-15 DIAGNOSIS — M1990 Unspecified osteoarthritis, unspecified site: Secondary | ICD-10-CM | POA: Diagnosis not present

## 2020-04-30 DIAGNOSIS — Z8546 Personal history of malignant neoplasm of prostate: Secondary | ICD-10-CM | POA: Diagnosis not present

## 2020-04-30 DIAGNOSIS — M1990 Unspecified osteoarthritis, unspecified site: Secondary | ICD-10-CM | POA: Diagnosis not present

## 2020-04-30 DIAGNOSIS — I10 Essential (primary) hypertension: Secondary | ICD-10-CM | POA: Diagnosis not present

## 2020-04-30 DIAGNOSIS — E039 Hypothyroidism, unspecified: Secondary | ICD-10-CM | POA: Diagnosis not present

## 2020-04-30 DIAGNOSIS — E7800 Pure hypercholesterolemia, unspecified: Secondary | ICD-10-CM | POA: Diagnosis not present

## 2020-04-30 DIAGNOSIS — N183 Chronic kidney disease, stage 3 unspecified: Secondary | ICD-10-CM | POA: Diagnosis not present

## 2020-04-30 DIAGNOSIS — N1832 Chronic kidney disease, stage 3b: Secondary | ICD-10-CM | POA: Diagnosis not present

## 2020-06-01 DIAGNOSIS — L570 Actinic keratosis: Secondary | ICD-10-CM | POA: Diagnosis not present

## 2020-06-01 DIAGNOSIS — L219 Seborrheic dermatitis, unspecified: Secondary | ICD-10-CM | POA: Diagnosis not present

## 2020-06-01 DIAGNOSIS — L814 Other melanin hyperpigmentation: Secondary | ICD-10-CM | POA: Diagnosis not present

## 2020-06-01 DIAGNOSIS — L578 Other skin changes due to chronic exposure to nonionizing radiation: Secondary | ICD-10-CM | POA: Diagnosis not present

## 2020-06-01 DIAGNOSIS — N1832 Chronic kidney disease, stage 3b: Secondary | ICD-10-CM | POA: Diagnosis not present

## 2020-06-01 DIAGNOSIS — N183 Chronic kidney disease, stage 3 unspecified: Secondary | ICD-10-CM | POA: Diagnosis not present

## 2020-06-01 DIAGNOSIS — Z85820 Personal history of malignant melanoma of skin: Secondary | ICD-10-CM | POA: Diagnosis not present

## 2020-06-01 DIAGNOSIS — E7800 Pure hypercholesterolemia, unspecified: Secondary | ICD-10-CM | POA: Diagnosis not present

## 2020-06-01 DIAGNOSIS — E039 Hypothyroidism, unspecified: Secondary | ICD-10-CM | POA: Diagnosis not present

## 2020-06-01 DIAGNOSIS — C44329 Squamous cell carcinoma of skin of other parts of face: Secondary | ICD-10-CM | POA: Diagnosis not present

## 2020-06-01 DIAGNOSIS — Z8546 Personal history of malignant neoplasm of prostate: Secondary | ICD-10-CM | POA: Diagnosis not present

## 2020-06-01 DIAGNOSIS — D485 Neoplasm of uncertain behavior of skin: Secondary | ICD-10-CM | POA: Diagnosis not present

## 2020-06-01 DIAGNOSIS — D225 Melanocytic nevi of trunk: Secondary | ICD-10-CM | POA: Diagnosis not present

## 2020-06-01 DIAGNOSIS — I10 Essential (primary) hypertension: Secondary | ICD-10-CM | POA: Diagnosis not present

## 2020-06-01 DIAGNOSIS — Z85828 Personal history of other malignant neoplasm of skin: Secondary | ICD-10-CM | POA: Diagnosis not present

## 2020-06-01 DIAGNOSIS — M1990 Unspecified osteoarthritis, unspecified site: Secondary | ICD-10-CM | POA: Diagnosis not present

## 2020-06-01 DIAGNOSIS — L821 Other seborrheic keratosis: Secondary | ICD-10-CM | POA: Diagnosis not present

## 2020-07-25 DIAGNOSIS — G5603 Carpal tunnel syndrome, bilateral upper limbs: Secondary | ICD-10-CM | POA: Diagnosis not present

## 2020-08-05 DIAGNOSIS — N1832 Chronic kidney disease, stage 3b: Secondary | ICD-10-CM | POA: Diagnosis not present

## 2020-08-05 DIAGNOSIS — N183 Chronic kidney disease, stage 3 unspecified: Secondary | ICD-10-CM | POA: Diagnosis not present

## 2020-08-05 DIAGNOSIS — I10 Essential (primary) hypertension: Secondary | ICD-10-CM | POA: Diagnosis not present

## 2020-08-05 DIAGNOSIS — E7800 Pure hypercholesterolemia, unspecified: Secondary | ICD-10-CM | POA: Diagnosis not present

## 2020-08-05 DIAGNOSIS — M1990 Unspecified osteoarthritis, unspecified site: Secondary | ICD-10-CM | POA: Diagnosis not present

## 2020-08-05 DIAGNOSIS — E039 Hypothyroidism, unspecified: Secondary | ICD-10-CM | POA: Diagnosis not present

## 2020-08-05 DIAGNOSIS — Z8546 Personal history of malignant neoplasm of prostate: Secondary | ICD-10-CM | POA: Diagnosis not present

## 2020-08-06 DIAGNOSIS — H353132 Nonexudative age-related macular degeneration, bilateral, intermediate dry stage: Secondary | ICD-10-CM | POA: Diagnosis not present

## 2020-08-20 DIAGNOSIS — C44329 Squamous cell carcinoma of skin of other parts of face: Secondary | ICD-10-CM | POA: Diagnosis not present

## 2020-09-15 DIAGNOSIS — N183 Chronic kidney disease, stage 3 unspecified: Secondary | ICD-10-CM | POA: Diagnosis not present

## 2020-09-15 DIAGNOSIS — M1990 Unspecified osteoarthritis, unspecified site: Secondary | ICD-10-CM | POA: Diagnosis not present

## 2020-09-15 DIAGNOSIS — N1832 Chronic kidney disease, stage 3b: Secondary | ICD-10-CM | POA: Diagnosis not present

## 2020-09-15 DIAGNOSIS — E7800 Pure hypercholesterolemia, unspecified: Secondary | ICD-10-CM | POA: Diagnosis not present

## 2020-09-15 DIAGNOSIS — E039 Hypothyroidism, unspecified: Secondary | ICD-10-CM | POA: Diagnosis not present

## 2020-09-15 DIAGNOSIS — Z8546 Personal history of malignant neoplasm of prostate: Secondary | ICD-10-CM | POA: Diagnosis not present

## 2020-09-15 DIAGNOSIS — I10 Essential (primary) hypertension: Secondary | ICD-10-CM | POA: Diagnosis not present

## 2020-09-15 DIAGNOSIS — K219 Gastro-esophageal reflux disease without esophagitis: Secondary | ICD-10-CM | POA: Diagnosis not present

## 2020-09-30 ENCOUNTER — Ambulatory Visit: Payer: PPO | Admitting: Internal Medicine

## 2020-10-15 DIAGNOSIS — M1990 Unspecified osteoarthritis, unspecified site: Secondary | ICD-10-CM | POA: Diagnosis not present

## 2020-10-15 DIAGNOSIS — E039 Hypothyroidism, unspecified: Secondary | ICD-10-CM | POA: Diagnosis not present

## 2020-10-15 DIAGNOSIS — E7800 Pure hypercholesterolemia, unspecified: Secondary | ICD-10-CM | POA: Diagnosis not present

## 2020-10-15 DIAGNOSIS — Z8546 Personal history of malignant neoplasm of prostate: Secondary | ICD-10-CM | POA: Diagnosis not present

## 2020-10-15 DIAGNOSIS — N183 Chronic kidney disease, stage 3 unspecified: Secondary | ICD-10-CM | POA: Diagnosis not present

## 2020-10-15 DIAGNOSIS — N1832 Chronic kidney disease, stage 3b: Secondary | ICD-10-CM | POA: Diagnosis not present

## 2020-10-15 DIAGNOSIS — K219 Gastro-esophageal reflux disease without esophagitis: Secondary | ICD-10-CM | POA: Diagnosis not present

## 2020-10-15 DIAGNOSIS — I10 Essential (primary) hypertension: Secondary | ICD-10-CM | POA: Diagnosis not present

## 2020-10-27 DIAGNOSIS — E039 Hypothyroidism, unspecified: Secondary | ICD-10-CM | POA: Diagnosis not present

## 2020-10-27 DIAGNOSIS — M1990 Unspecified osteoarthritis, unspecified site: Secondary | ICD-10-CM | POA: Diagnosis not present

## 2020-10-27 DIAGNOSIS — N1832 Chronic kidney disease, stage 3b: Secondary | ICD-10-CM | POA: Diagnosis not present

## 2020-10-27 DIAGNOSIS — I10 Essential (primary) hypertension: Secondary | ICD-10-CM | POA: Diagnosis not present

## 2020-10-27 DIAGNOSIS — E7800 Pure hypercholesterolemia, unspecified: Secondary | ICD-10-CM | POA: Diagnosis not present

## 2020-10-27 DIAGNOSIS — K219 Gastro-esophageal reflux disease without esophagitis: Secondary | ICD-10-CM | POA: Diagnosis not present

## 2020-10-27 DIAGNOSIS — Z8546 Personal history of malignant neoplasm of prostate: Secondary | ICD-10-CM | POA: Diagnosis not present

## 2020-10-31 DIAGNOSIS — G5603 Carpal tunnel syndrome, bilateral upper limbs: Secondary | ICD-10-CM | POA: Diagnosis not present

## 2020-11-30 DIAGNOSIS — M1990 Unspecified osteoarthritis, unspecified site: Secondary | ICD-10-CM | POA: Diagnosis not present

## 2020-11-30 DIAGNOSIS — E039 Hypothyroidism, unspecified: Secondary | ICD-10-CM | POA: Diagnosis not present

## 2020-11-30 DIAGNOSIS — K219 Gastro-esophageal reflux disease without esophagitis: Secondary | ICD-10-CM | POA: Diagnosis not present

## 2020-11-30 DIAGNOSIS — N1832 Chronic kidney disease, stage 3b: Secondary | ICD-10-CM | POA: Diagnosis not present

## 2020-11-30 DIAGNOSIS — E7800 Pure hypercholesterolemia, unspecified: Secondary | ICD-10-CM | POA: Diagnosis not present

## 2020-11-30 DIAGNOSIS — N183 Chronic kidney disease, stage 3 unspecified: Secondary | ICD-10-CM | POA: Diagnosis not present

## 2020-11-30 DIAGNOSIS — I10 Essential (primary) hypertension: Secondary | ICD-10-CM | POA: Diagnosis not present

## 2020-11-30 DIAGNOSIS — Z8546 Personal history of malignant neoplasm of prostate: Secondary | ICD-10-CM | POA: Diagnosis not present

## 2020-12-25 DIAGNOSIS — I10 Essential (primary) hypertension: Secondary | ICD-10-CM | POA: Diagnosis not present

## 2020-12-25 DIAGNOSIS — N183 Chronic kidney disease, stage 3 unspecified: Secondary | ICD-10-CM | POA: Diagnosis not present

## 2020-12-25 DIAGNOSIS — E7800 Pure hypercholesterolemia, unspecified: Secondary | ICD-10-CM | POA: Diagnosis not present

## 2020-12-25 DIAGNOSIS — K219 Gastro-esophageal reflux disease without esophagitis: Secondary | ICD-10-CM | POA: Diagnosis not present

## 2020-12-25 DIAGNOSIS — Z8546 Personal history of malignant neoplasm of prostate: Secondary | ICD-10-CM | POA: Diagnosis not present

## 2020-12-25 DIAGNOSIS — E039 Hypothyroidism, unspecified: Secondary | ICD-10-CM | POA: Diagnosis not present

## 2020-12-25 DIAGNOSIS — N1832 Chronic kidney disease, stage 3b: Secondary | ICD-10-CM | POA: Diagnosis not present

## 2020-12-25 DIAGNOSIS — M1990 Unspecified osteoarthritis, unspecified site: Secondary | ICD-10-CM | POA: Diagnosis not present

## 2021-01-29 DIAGNOSIS — N1832 Chronic kidney disease, stage 3b: Secondary | ICD-10-CM | POA: Diagnosis not present

## 2021-01-29 DIAGNOSIS — I10 Essential (primary) hypertension: Secondary | ICD-10-CM | POA: Diagnosis not present

## 2021-01-29 DIAGNOSIS — E039 Hypothyroidism, unspecified: Secondary | ICD-10-CM | POA: Diagnosis not present

## 2021-01-29 DIAGNOSIS — M1990 Unspecified osteoarthritis, unspecified site: Secondary | ICD-10-CM | POA: Diagnosis not present

## 2021-01-29 DIAGNOSIS — G5603 Carpal tunnel syndrome, bilateral upper limbs: Secondary | ICD-10-CM | POA: Diagnosis not present

## 2021-01-29 DIAGNOSIS — E7800 Pure hypercholesterolemia, unspecified: Secondary | ICD-10-CM | POA: Diagnosis not present

## 2021-01-29 DIAGNOSIS — N183 Chronic kidney disease, stage 3 unspecified: Secondary | ICD-10-CM | POA: Diagnosis not present

## 2021-01-29 DIAGNOSIS — K219 Gastro-esophageal reflux disease without esophagitis: Secondary | ICD-10-CM | POA: Diagnosis not present

## 2021-01-29 DIAGNOSIS — Z8546 Personal history of malignant neoplasm of prostate: Secondary | ICD-10-CM | POA: Diagnosis not present

## 2021-03-16 DIAGNOSIS — G5601 Carpal tunnel syndrome, right upper limb: Secondary | ICD-10-CM | POA: Diagnosis not present

## 2021-04-01 DIAGNOSIS — Z1211 Encounter for screening for malignant neoplasm of colon: Secondary | ICD-10-CM | POA: Diagnosis not present

## 2021-04-01 DIAGNOSIS — E7800 Pure hypercholesterolemia, unspecified: Secondary | ICD-10-CM | POA: Diagnosis not present

## 2021-04-01 DIAGNOSIS — Z8546 Personal history of malignant neoplasm of prostate: Secondary | ICD-10-CM | POA: Diagnosis not present

## 2021-04-01 DIAGNOSIS — I10 Essential (primary) hypertension: Secondary | ICD-10-CM | POA: Diagnosis not present

## 2021-04-01 DIAGNOSIS — M1000 Idiopathic gout, unspecified site: Secondary | ICD-10-CM | POA: Diagnosis not present

## 2021-04-01 DIAGNOSIS — E039 Hypothyroidism, unspecified: Secondary | ICD-10-CM | POA: Diagnosis not present

## 2021-04-01 DIAGNOSIS — Z0000 Encounter for general adult medical examination without abnormal findings: Secondary | ICD-10-CM | POA: Diagnosis not present

## 2021-04-11 DIAGNOSIS — U071 COVID-19: Secondary | ICD-10-CM | POA: Diagnosis not present

## 2021-04-28 DIAGNOSIS — M1990 Unspecified osteoarthritis, unspecified site: Secondary | ICD-10-CM | POA: Diagnosis not present

## 2021-04-28 DIAGNOSIS — I10 Essential (primary) hypertension: Secondary | ICD-10-CM | POA: Diagnosis not present

## 2021-04-28 DIAGNOSIS — E7800 Pure hypercholesterolemia, unspecified: Secondary | ICD-10-CM | POA: Diagnosis not present

## 2021-04-28 DIAGNOSIS — N1832 Chronic kidney disease, stage 3b: Secondary | ICD-10-CM | POA: Diagnosis not present

## 2021-04-28 DIAGNOSIS — Z8546 Personal history of malignant neoplasm of prostate: Secondary | ICD-10-CM | POA: Diagnosis not present

## 2021-04-28 DIAGNOSIS — E039 Hypothyroidism, unspecified: Secondary | ICD-10-CM | POA: Diagnosis not present

## 2021-04-28 DIAGNOSIS — K219 Gastro-esophageal reflux disease without esophagitis: Secondary | ICD-10-CM | POA: Diagnosis not present

## 2021-05-21 DIAGNOSIS — E7800 Pure hypercholesterolemia, unspecified: Secondary | ICD-10-CM | POA: Diagnosis not present

## 2021-05-21 DIAGNOSIS — Z8546 Personal history of malignant neoplasm of prostate: Secondary | ICD-10-CM | POA: Diagnosis not present

## 2021-05-21 DIAGNOSIS — I10 Essential (primary) hypertension: Secondary | ICD-10-CM | POA: Diagnosis not present

## 2021-05-21 DIAGNOSIS — N1832 Chronic kidney disease, stage 3b: Secondary | ICD-10-CM | POA: Diagnosis not present

## 2021-05-21 DIAGNOSIS — K219 Gastro-esophageal reflux disease without esophagitis: Secondary | ICD-10-CM | POA: Diagnosis not present

## 2021-05-21 DIAGNOSIS — M1990 Unspecified osteoarthritis, unspecified site: Secondary | ICD-10-CM | POA: Diagnosis not present

## 2021-05-21 DIAGNOSIS — E039 Hypothyroidism, unspecified: Secondary | ICD-10-CM | POA: Diagnosis not present

## 2021-06-01 DIAGNOSIS — Z85820 Personal history of malignant melanoma of skin: Secondary | ICD-10-CM | POA: Diagnosis not present

## 2021-06-01 DIAGNOSIS — L821 Other seborrheic keratosis: Secondary | ICD-10-CM | POA: Diagnosis not present

## 2021-06-01 DIAGNOSIS — L570 Actinic keratosis: Secondary | ICD-10-CM | POA: Diagnosis not present

## 2021-06-01 DIAGNOSIS — L219 Seborrheic dermatitis, unspecified: Secondary | ICD-10-CM | POA: Diagnosis not present

## 2021-06-01 DIAGNOSIS — D225 Melanocytic nevi of trunk: Secondary | ICD-10-CM | POA: Diagnosis not present

## 2021-06-01 DIAGNOSIS — Z85828 Personal history of other malignant neoplasm of skin: Secondary | ICD-10-CM | POA: Diagnosis not present

## 2021-06-01 DIAGNOSIS — L578 Other skin changes due to chronic exposure to nonionizing radiation: Secondary | ICD-10-CM | POA: Diagnosis not present

## 2021-06-01 DIAGNOSIS — L814 Other melanin hyperpigmentation: Secondary | ICD-10-CM | POA: Diagnosis not present

## 2021-07-29 DIAGNOSIS — G5603 Carpal tunnel syndrome, bilateral upper limbs: Secondary | ICD-10-CM | POA: Diagnosis not present

## 2021-08-09 DIAGNOSIS — M1990 Unspecified osteoarthritis, unspecified site: Secondary | ICD-10-CM | POA: Diagnosis not present

## 2021-08-09 DIAGNOSIS — E7800 Pure hypercholesterolemia, unspecified: Secondary | ICD-10-CM | POA: Diagnosis not present

## 2021-08-09 DIAGNOSIS — N1832 Chronic kidney disease, stage 3b: Secondary | ICD-10-CM | POA: Diagnosis not present

## 2021-08-09 DIAGNOSIS — I10 Essential (primary) hypertension: Secondary | ICD-10-CM | POA: Diagnosis not present

## 2021-08-09 DIAGNOSIS — K219 Gastro-esophageal reflux disease without esophagitis: Secondary | ICD-10-CM | POA: Diagnosis not present

## 2021-08-09 DIAGNOSIS — E039 Hypothyroidism, unspecified: Secondary | ICD-10-CM | POA: Diagnosis not present

## 2021-08-20 DIAGNOSIS — K219 Gastro-esophageal reflux disease without esophagitis: Secondary | ICD-10-CM | POA: Diagnosis not present

## 2021-08-20 DIAGNOSIS — E039 Hypothyroidism, unspecified: Secondary | ICD-10-CM | POA: Diagnosis not present

## 2021-08-20 DIAGNOSIS — E7800 Pure hypercholesterolemia, unspecified: Secondary | ICD-10-CM | POA: Diagnosis not present

## 2021-08-20 DIAGNOSIS — N1832 Chronic kidney disease, stage 3b: Secondary | ICD-10-CM | POA: Diagnosis not present

## 2021-08-20 DIAGNOSIS — I10 Essential (primary) hypertension: Secondary | ICD-10-CM | POA: Diagnosis not present

## 2021-08-20 DIAGNOSIS — M1990 Unspecified osteoarthritis, unspecified site: Secondary | ICD-10-CM | POA: Diagnosis not present

## 2021-09-13 DIAGNOSIS — H353132 Nonexudative age-related macular degeneration, bilateral, intermediate dry stage: Secondary | ICD-10-CM | POA: Diagnosis not present

## 2021-09-23 DIAGNOSIS — G5601 Carpal tunnel syndrome, right upper limb: Secondary | ICD-10-CM | POA: Diagnosis not present

## 2021-10-04 DIAGNOSIS — N1832 Chronic kidney disease, stage 3b: Secondary | ICD-10-CM | POA: Diagnosis not present

## 2021-10-04 DIAGNOSIS — E039 Hypothyroidism, unspecified: Secondary | ICD-10-CM | POA: Diagnosis not present

## 2021-10-04 DIAGNOSIS — K219 Gastro-esophageal reflux disease without esophagitis: Secondary | ICD-10-CM | POA: Diagnosis not present

## 2021-10-04 DIAGNOSIS — E7800 Pure hypercholesterolemia, unspecified: Secondary | ICD-10-CM | POA: Diagnosis not present

## 2021-10-04 DIAGNOSIS — I10 Essential (primary) hypertension: Secondary | ICD-10-CM | POA: Diagnosis not present

## 2021-10-26 DIAGNOSIS — K219 Gastro-esophageal reflux disease without esophagitis: Secondary | ICD-10-CM | POA: Diagnosis not present

## 2021-10-26 DIAGNOSIS — E039 Hypothyroidism, unspecified: Secondary | ICD-10-CM | POA: Diagnosis not present

## 2021-10-26 DIAGNOSIS — E7800 Pure hypercholesterolemia, unspecified: Secondary | ICD-10-CM | POA: Diagnosis not present

## 2021-10-26 DIAGNOSIS — I10 Essential (primary) hypertension: Secondary | ICD-10-CM | POA: Diagnosis not present

## 2022-01-03 DIAGNOSIS — E039 Hypothyroidism, unspecified: Secondary | ICD-10-CM | POA: Diagnosis not present

## 2022-01-03 DIAGNOSIS — I10 Essential (primary) hypertension: Secondary | ICD-10-CM | POA: Diagnosis not present

## 2022-01-03 DIAGNOSIS — E7800 Pure hypercholesterolemia, unspecified: Secondary | ICD-10-CM | POA: Diagnosis not present

## 2022-02-09 DIAGNOSIS — Z1211 Encounter for screening for malignant neoplasm of colon: Secondary | ICD-10-CM | POA: Diagnosis not present

## 2022-03-01 DIAGNOSIS — G5603 Carpal tunnel syndrome, bilateral upper limbs: Secondary | ICD-10-CM | POA: Diagnosis not present

## 2022-03-15 DIAGNOSIS — E7800 Pure hypercholesterolemia, unspecified: Secondary | ICD-10-CM | POA: Diagnosis not present

## 2022-03-15 DIAGNOSIS — E039 Hypothyroidism, unspecified: Secondary | ICD-10-CM | POA: Diagnosis not present

## 2022-03-15 DIAGNOSIS — I10 Essential (primary) hypertension: Secondary | ICD-10-CM | POA: Diagnosis not present

## 2022-03-30 DIAGNOSIS — K219 Gastro-esophageal reflux disease without esophagitis: Secondary | ICD-10-CM | POA: Diagnosis not present

## 2022-03-30 DIAGNOSIS — R195 Other fecal abnormalities: Secondary | ICD-10-CM | POA: Diagnosis not present

## 2022-04-06 DIAGNOSIS — E039 Hypothyroidism, unspecified: Secondary | ICD-10-CM | POA: Diagnosis not present

## 2022-04-06 DIAGNOSIS — Z0000 Encounter for general adult medical examination without abnormal findings: Secondary | ICD-10-CM | POA: Diagnosis not present

## 2022-04-06 DIAGNOSIS — I10 Essential (primary) hypertension: Secondary | ICD-10-CM | POA: Diagnosis not present

## 2022-04-06 DIAGNOSIS — K219 Gastro-esophageal reflux disease without esophagitis: Secondary | ICD-10-CM | POA: Diagnosis not present

## 2022-04-06 DIAGNOSIS — H6122 Impacted cerumen, left ear: Secondary | ICD-10-CM | POA: Diagnosis not present

## 2022-04-06 DIAGNOSIS — Z8546 Personal history of malignant neoplasm of prostate: Secondary | ICD-10-CM | POA: Diagnosis not present

## 2022-04-06 DIAGNOSIS — E7800 Pure hypercholesterolemia, unspecified: Secondary | ICD-10-CM | POA: Diagnosis not present

## 2022-04-06 DIAGNOSIS — H6993 Unspecified Eustachian tube disorder, bilateral: Secondary | ICD-10-CM | POA: Diagnosis not present

## 2022-04-06 DIAGNOSIS — N1832 Chronic kidney disease, stage 3b: Secondary | ICD-10-CM | POA: Diagnosis not present

## 2022-04-06 DIAGNOSIS — M1000 Idiopathic gout, unspecified site: Secondary | ICD-10-CM | POA: Diagnosis not present

## 2022-04-08 DIAGNOSIS — G5602 Carpal tunnel syndrome, left upper limb: Secondary | ICD-10-CM | POA: Diagnosis not present

## 2022-04-12 DIAGNOSIS — H524 Presbyopia: Secondary | ICD-10-CM | POA: Diagnosis not present

## 2022-04-12 DIAGNOSIS — H52223 Regular astigmatism, bilateral: Secondary | ICD-10-CM | POA: Diagnosis not present

## 2022-04-12 DIAGNOSIS — H353132 Nonexudative age-related macular degeneration, bilateral, intermediate dry stage: Secondary | ICD-10-CM | POA: Diagnosis not present

## 2022-04-12 DIAGNOSIS — H5203 Hypermetropia, bilateral: Secondary | ICD-10-CM | POA: Diagnosis not present

## 2022-04-12 DIAGNOSIS — H2513 Age-related nuclear cataract, bilateral: Secondary | ICD-10-CM | POA: Diagnosis not present

## 2022-04-15 DIAGNOSIS — E039 Hypothyroidism, unspecified: Secondary | ICD-10-CM | POA: Diagnosis not present

## 2022-04-15 DIAGNOSIS — I10 Essential (primary) hypertension: Secondary | ICD-10-CM | POA: Diagnosis not present

## 2022-04-15 DIAGNOSIS — E7800 Pure hypercholesterolemia, unspecified: Secondary | ICD-10-CM | POA: Diagnosis not present

## 2022-05-26 DIAGNOSIS — R195 Other fecal abnormalities: Secondary | ICD-10-CM | POA: Diagnosis not present

## 2022-06-14 DIAGNOSIS — I10 Essential (primary) hypertension: Secondary | ICD-10-CM | POA: Diagnosis not present

## 2022-06-14 DIAGNOSIS — E7800 Pure hypercholesterolemia, unspecified: Secondary | ICD-10-CM | POA: Diagnosis not present

## 2022-06-14 DIAGNOSIS — E039 Hypothyroidism, unspecified: Secondary | ICD-10-CM | POA: Diagnosis not present

## 2022-09-13 DIAGNOSIS — E039 Hypothyroidism, unspecified: Secondary | ICD-10-CM | POA: Diagnosis not present

## 2022-09-13 DIAGNOSIS — I10 Essential (primary) hypertension: Secondary | ICD-10-CM | POA: Diagnosis not present

## 2022-09-13 DIAGNOSIS — E7800 Pure hypercholesterolemia, unspecified: Secondary | ICD-10-CM | POA: Diagnosis not present

## 2022-11-02 DIAGNOSIS — L570 Actinic keratosis: Secondary | ICD-10-CM | POA: Diagnosis not present

## 2022-11-02 DIAGNOSIS — L821 Other seborrheic keratosis: Secondary | ICD-10-CM | POA: Diagnosis not present

## 2022-11-02 DIAGNOSIS — D044 Carcinoma in situ of skin of scalp and neck: Secondary | ICD-10-CM | POA: Diagnosis not present

## 2022-11-02 DIAGNOSIS — D485 Neoplasm of uncertain behavior of skin: Secondary | ICD-10-CM | POA: Diagnosis not present

## 2022-11-02 DIAGNOSIS — D2272 Melanocytic nevi of left lower limb, including hip: Secondary | ICD-10-CM | POA: Diagnosis not present

## 2022-11-02 DIAGNOSIS — D2261 Melanocytic nevi of right upper limb, including shoulder: Secondary | ICD-10-CM | POA: Diagnosis not present

## 2022-11-02 DIAGNOSIS — D2271 Melanocytic nevi of right lower limb, including hip: Secondary | ICD-10-CM | POA: Diagnosis not present

## 2022-11-02 DIAGNOSIS — D225 Melanocytic nevi of trunk: Secondary | ICD-10-CM | POA: Diagnosis not present

## 2022-11-02 DIAGNOSIS — D2262 Melanocytic nevi of left upper limb, including shoulder: Secondary | ICD-10-CM | POA: Diagnosis not present

## 2022-11-04 DIAGNOSIS — E039 Hypothyroidism, unspecified: Secondary | ICD-10-CM | POA: Diagnosis not present

## 2022-11-04 DIAGNOSIS — I10 Essential (primary) hypertension: Secondary | ICD-10-CM | POA: Diagnosis not present

## 2022-11-04 DIAGNOSIS — K219 Gastro-esophageal reflux disease without esophagitis: Secondary | ICD-10-CM | POA: Diagnosis not present

## 2022-11-04 DIAGNOSIS — Z8546 Personal history of malignant neoplasm of prostate: Secondary | ICD-10-CM | POA: Diagnosis not present

## 2022-11-04 DIAGNOSIS — N1832 Chronic kidney disease, stage 3b: Secondary | ICD-10-CM | POA: Diagnosis not present

## 2022-11-04 DIAGNOSIS — E7800 Pure hypercholesterolemia, unspecified: Secondary | ICD-10-CM | POA: Diagnosis not present

## 2022-11-16 DIAGNOSIS — D044 Carcinoma in situ of skin of scalp and neck: Secondary | ICD-10-CM | POA: Diagnosis not present

## 2022-11-17 DIAGNOSIS — H903 Sensorineural hearing loss, bilateral: Secondary | ICD-10-CM | POA: Diagnosis not present

## 2023-01-21 DIAGNOSIS — M25571 Pain in right ankle and joints of right foot: Secondary | ICD-10-CM | POA: Diagnosis not present

## 2023-02-24 DIAGNOSIS — H52223 Regular astigmatism, bilateral: Secondary | ICD-10-CM | POA: Diagnosis not present

## 2023-02-24 DIAGNOSIS — H524 Presbyopia: Secondary | ICD-10-CM | POA: Diagnosis not present

## 2023-02-24 DIAGNOSIS — H5203 Hypermetropia, bilateral: Secondary | ICD-10-CM | POA: Diagnosis not present

## 2023-02-24 DIAGNOSIS — H353132 Nonexudative age-related macular degeneration, bilateral, intermediate dry stage: Secondary | ICD-10-CM | POA: Diagnosis not present

## 2023-02-24 DIAGNOSIS — H2513 Age-related nuclear cataract, bilateral: Secondary | ICD-10-CM | POA: Diagnosis not present

## 2023-02-24 DIAGNOSIS — H0288B Meibomian gland dysfunction left eye, upper and lower eyelids: Secondary | ICD-10-CM | POA: Diagnosis not present

## 2023-04-12 DIAGNOSIS — D225 Melanocytic nevi of trunk: Secondary | ICD-10-CM | POA: Diagnosis not present

## 2023-04-12 DIAGNOSIS — D0439 Carcinoma in situ of skin of other parts of face: Secondary | ICD-10-CM | POA: Diagnosis not present

## 2023-04-12 DIAGNOSIS — Z85828 Personal history of other malignant neoplasm of skin: Secondary | ICD-10-CM | POA: Diagnosis not present

## 2023-04-12 DIAGNOSIS — D2272 Melanocytic nevi of left lower limb, including hip: Secondary | ICD-10-CM | POA: Diagnosis not present

## 2023-04-12 DIAGNOSIS — Z85820 Personal history of malignant melanoma of skin: Secondary | ICD-10-CM | POA: Diagnosis not present

## 2023-04-12 DIAGNOSIS — D2262 Melanocytic nevi of left upper limb, including shoulder: Secondary | ICD-10-CM | POA: Diagnosis not present

## 2023-04-12 DIAGNOSIS — L570 Actinic keratosis: Secondary | ICD-10-CM | POA: Diagnosis not present

## 2023-04-12 DIAGNOSIS — D2261 Melanocytic nevi of right upper limb, including shoulder: Secondary | ICD-10-CM | POA: Diagnosis not present

## 2023-04-12 DIAGNOSIS — D485 Neoplasm of uncertain behavior of skin: Secondary | ICD-10-CM | POA: Diagnosis not present

## 2023-04-13 DIAGNOSIS — K219 Gastro-esophageal reflux disease without esophagitis: Secondary | ICD-10-CM | POA: Diagnosis not present

## 2023-04-13 DIAGNOSIS — Z0000 Encounter for general adult medical examination without abnormal findings: Secondary | ICD-10-CM | POA: Diagnosis not present

## 2023-04-13 DIAGNOSIS — Z8546 Personal history of malignant neoplasm of prostate: Secondary | ICD-10-CM | POA: Diagnosis not present

## 2023-04-13 DIAGNOSIS — E7800 Pure hypercholesterolemia, unspecified: Secondary | ICD-10-CM | POA: Diagnosis not present

## 2023-04-13 DIAGNOSIS — M1000 Idiopathic gout, unspecified site: Secondary | ICD-10-CM | POA: Diagnosis not present

## 2023-04-13 DIAGNOSIS — N1832 Chronic kidney disease, stage 3b: Secondary | ICD-10-CM | POA: Diagnosis not present

## 2023-04-13 DIAGNOSIS — E039 Hypothyroidism, unspecified: Secondary | ICD-10-CM | POA: Diagnosis not present

## 2023-04-13 DIAGNOSIS — I10 Essential (primary) hypertension: Secondary | ICD-10-CM | POA: Diagnosis not present

## 2023-06-14 DIAGNOSIS — D0439 Carcinoma in situ of skin of other parts of face: Secondary | ICD-10-CM | POA: Diagnosis not present

## 2023-08-29 DIAGNOSIS — H353132 Nonexudative age-related macular degeneration, bilateral, intermediate dry stage: Secondary | ICD-10-CM | POA: Diagnosis not present

## 2023-09-28 DIAGNOSIS — R0789 Other chest pain: Secondary | ICD-10-CM | POA: Diagnosis not present

## 2023-09-28 DIAGNOSIS — K219 Gastro-esophageal reflux disease without esophagitis: Secondary | ICD-10-CM | POA: Diagnosis not present

## 2023-10-05 DIAGNOSIS — R600 Localized edema: Secondary | ICD-10-CM | POA: Diagnosis not present

## 2023-10-05 DIAGNOSIS — K219 Gastro-esophageal reflux disease without esophagitis: Secondary | ICD-10-CM | POA: Diagnosis not present

## 2023-10-05 DIAGNOSIS — R0602 Shortness of breath: Secondary | ICD-10-CM | POA: Diagnosis not present

## 2023-10-09 DIAGNOSIS — R0602 Shortness of breath: Secondary | ICD-10-CM | POA: Diagnosis not present

## 2023-10-10 ENCOUNTER — Other Ambulatory Visit (HOSPITAL_COMMUNITY): Payer: Self-pay | Admitting: Physician Assistant

## 2023-10-10 DIAGNOSIS — R0602 Shortness of breath: Secondary | ICD-10-CM

## 2023-10-10 DIAGNOSIS — R600 Localized edema: Secondary | ICD-10-CM

## 2023-10-13 ENCOUNTER — Ambulatory Visit
Admission: RE | Admit: 2023-10-13 | Discharge: 2023-10-13 | Disposition: A | Discharge status: Home / Self Care | Payer: PPO | Source: Ambulatory Visit | Attending: Physician Assistant | Admitting: Physician Assistant

## 2023-10-13 DIAGNOSIS — R918 Other nonspecific abnormal finding of lung field: Secondary | ICD-10-CM | POA: Diagnosis not present

## 2023-10-13 DIAGNOSIS — R600 Localized edema: Secondary | ICD-10-CM | POA: Diagnosis not present

## 2023-10-13 DIAGNOSIS — J929 Pleural plaque without asbestos: Secondary | ICD-10-CM | POA: Diagnosis not present

## 2023-10-13 DIAGNOSIS — S2242XA Multiple fractures of ribs, left side, initial encounter for closed fracture: Secondary | ICD-10-CM | POA: Diagnosis not present

## 2023-10-13 DIAGNOSIS — R0602 Shortness of breath: Secondary | ICD-10-CM | POA: Diagnosis not present

## 2023-10-13 LAB — POCT I-STAT, CHEM 8
BUN: 29 mg/dL — ABNORMAL HIGH (ref 8–23)
Calcium, Ion: 1.13 mmol/L — ABNORMAL LOW (ref 1.15–1.40)
Chloride: 107 mmol/L (ref 98–111)
Creatinine, Ser: 1.8 mg/dL — ABNORMAL HIGH (ref 0.61–1.24)
Glucose, Bld: 128 mg/dL — ABNORMAL HIGH (ref 70–99)
HCT: 41 % (ref 39.0–52.0)
Hemoglobin: 13.9 g/dL (ref 13.0–17.0)
Potassium: 3.7 mmol/L (ref 3.5–5.1)
Sodium: 145 mmol/L (ref 135–145)
TCO2: 26 mmol/L (ref 22–32)

## 2023-10-13 MED ORDER — IOHEXOL 300 MG/ML  SOLN
75.0000 mL | Freq: Once | INTRAMUSCULAR | Status: AC | PRN
  Administered 2023-10-13: 60 mL via INTRAVENOUS

## 2023-11-08 ENCOUNTER — Other Ambulatory Visit (HOSPITAL_COMMUNITY): Payer: Self-pay | Admitting: Family Medicine

## 2023-11-08 DIAGNOSIS — I509 Heart failure, unspecified: Secondary | ICD-10-CM

## 2023-11-09 ENCOUNTER — Ambulatory Visit: Payer: PPO | Attending: Internal Medicine

## 2023-11-09 DIAGNOSIS — I509 Heart failure, unspecified: Secondary | ICD-10-CM

## 2023-11-09 LAB — ECHOCARDIOGRAM COMPLETE
AV Mean grad: 5 mm[Hg]
AV Peak grad: 9.5 mm[Hg]
Ao pk vel: 1.54 m/s
Area-P 1/2: 3.77 cm2
S' Lateral: 4.5 cm
Single Plane A2C EF: 48.5 %

## 2023-11-09 MED ORDER — PERFLUTREN LIPID MICROSPHERE
1.0000 mL | INTRAVENOUS | Status: AC | PRN
  Administered 2023-11-09: 3 mL via INTRAVENOUS

## 2023-12-07 ENCOUNTER — Ambulatory Visit: Payer: PPO | Admitting: Internal Medicine

## 2023-12-08 NOTE — Progress Notes (Signed)
 Cardiology Office Note    Date:  12/11/2023   ID:  Tyler David, DOB 07-Mar-1946, MRN [id number]  PCP:  Noberto Retort, MD  Cardiologist:  Yvonne Kendall, MD  Electrophysiologist:  None   Chief Complaint: Evaluation of abnormal echo  History of Present Illness:   Tyler David is a 78 y.o. male with history of coronary artery calcification noted on CT imaging, recently diagnosed cardiomyopathy, HTN, HLD, hypothyroidism, prostate cancer, CKD stage III, LBBB, GERD, and migraine headaches who presents for evaluation of recently diagnosed cardiomyopathy.  He was initially evaluated by our office in 12/2019 for palpitations and newly diagnosed LBBB.  At that time, he was asymptomatic other than sporadic palpitations.  Echo in 01/2020 showed an EF of 55%, no regional wall motion normalities, mild LVH, grade 1 diastolic dysfunction, normal RV systolic function and ventricular cavity size, and moderately elevated RVSP estimated at 41.4 mmHg.  Lexiscan MPI in 01/2020 was low risk with an EF of 55 to 65%.  LBBB noted.  He was last seen in the office in 03/2020 noting a 20 pound weight loss with dietary modification with associated improved blood pressure.  He reported minimal palpitations.  He was evaluated by his PCP's office in 09/2023 for URI and chest discomfort.  He was started on pantoprazole.  Workup at that time included a normal D-dimer, BNP 570, chest x-ray with possible pulmonary edema, and a CT of the chest showed pulmonary residual edema, coronary artery calcification, and cholelithiasis.  Chronic left rib fractures were also noted.  He was started on furosemide with some improvement in symptoms, though it ran out at time of his follow-up appointment with PCP in December.  At his last visit with PCP on 1/20 11/2023, he was restarted on furosemide 20 mg daily.  Echo in 10/2023 showed an EF of 35 to 40%, mild global hypokinesis with severe inferior wall hypokinesis, grade 1 diastolic  dysfunction, normal RV systolic function and ventricular cavity size, mild to moderate mitral regurgitation, and an estimated right atrial pressure of 3 mmHg.  He comes in accompanied by his wife today.  He history of exertional chest discomfort/burning occurring while mowing his lawn over the past 2 to 3 years.  More recently, he has noted a progression of his exertional chest discomfort/burning when ambulating uphill.  He is without symptoms of chest discomfort when ambulating on flat ground.  Symptoms do not occur at rest.  During this same timeframe, he also noted an increase in dyspnea with associated orthopnea.  Following the initiation of furosemide as outlined above, his dyspnea and orthopnea are improving.  He notes mild chronic bilateral lower extremity swelling with the right lower extremity historically slightly worse than the left.  His weight is down 4 pounds today when compared to his last visit in our office in 2021.  Notes good urine output on furosemide.  Drinking less than 2 L of liquid daily, not adding salt to food.   Labs independently reviewed: 09/2023 - potassium 3.7, BUN 29, serum creatinine 1.8, Hgb 13.9, BNP 570  Past Medical History:  Diagnosis Date   Arthritis    Cancer (HCC)    prostate cancer    Chronic kidney disease, stage 3 (HCC)    ED (erectile dysfunction)    GERD (gastroesophageal reflux disease)    Headache    hx of migraine-2015    Hyperlipidemia    Hypertension    Migraine    Nocturia    Overactive bladder  Pneumonia    hx of at age 56     Past Surgical History:  Procedure Laterality Date   PROSTATE SURGERY     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   Procedure: LEFT TOTAL HIP ARTHROPLASTY ANTERIOR APPROACH;  Surgeon: Durene Romans, MD;  Location: [facility];  Service: Orthopedics;  Laterality: Left;    Current Medications: Current Meds  Medication Sig   Cholecalciferol (VITAMIN D-3) 25 MCG (1000 UT) CAPS Take 1 capsule by mouth daily.    famotidine (PEPCID) 20 MG tablet Take 20 mg by mouth daily.   furosemide (LASIX) 20 MG tablet Take 20 mg by mouth daily.   levothyroxine (SYNTHROID) 50 MCG tablet Take 50 mcg by mouth daily before breakfast.   Multiple Vitamins-Minerals (ICAPS AREDS 2 PO) Take 2 capsules by mouth 2 (two) times daily as needed.   Omega-3 Fatty Acids (FISH OIL) 1000 MG CAPS Take by mouth daily.   sacubitril-valsartan (ENTRESTO) 49-51 MG Take 1 tablet by mouth 2 (two) times daily.   Turmeric 500 MG CAPS Take by mouth in the morning, at noon, and at bedtime.   [DISCONTINUED] amLODipine (NORVASC) 10 MG tablet Take 10 mg by mouth daily.   [DISCONTINUED] telmisartan (MICARDIS) 80 MG tablet Take 80 mg by mouth daily.    Allergies:   Atorvastatin, Pravachol [pravastatin], Simvastatin, Statins, and Penicillins   Social History   Socioeconomic History   Marital status: Married    Spouse name: Not on file   Number of children: Not on file   Years of education: Not on file   Highest education level: Not on file  Occupational History   Not on file  Tobacco Use   Smoking status: Never   Smokeless tobacco: Never  Substance and Sexual Activity   Alcohol use: Yes    Comment: rare   Drug use: No   Sexual activity: Not on file  Other Topics Concern   Not on file  Social History Narrative   Not on file   Social Drivers of Health   Financial Resource Strain: Not on file  Food Insecurity: Not on file  Transportation Needs: Not on file  Physical Activity: Not on file  Stress: Not on file  Social Connections: Not on file     Family History:  The patient's family history includes Atrial fibrillation in his brother; COPD in his father; Heart failure in his father; Ovarian cancer in his mother; Prostate cancer in his brother.  ROS:   12-point review of systems is negative unless otherwise noted in the HPI.   EKGs/Labs/Other Studies Reviewed:    Studies reviewed were summarized above. The additional studies  were reviewed today:  2D echo 11/09/2023: 1. Left ventricular ejection fraction, by estimation, is 35 to 40%. Left  ventricular ejection fraction by PLAX is 35 %. The left ventricle has  moderately decreased function. The left ventricle demonstrates mild global  hypokinesis with severe inferior  wall hypokinesis. Left ventricular diastolic parameters are consistent  with Grade I diastolic dysfunction (impaired relaxation).   2. Right ventricular systolic function is normal. The right ventricular  size is normal.   3. The mitral valve is normal in structure. Mild to moderate mitral valve  regurgitation. No evidence of mitral stenosis.   4. The aortic valve is normal in structure. Aortic valve regurgitation is  not visualized. No aortic stenosis is present.   5. The inferior vena cava is normal in size with greater than 50%  respiratory variability, suggesting right atrial pressure  of 3 mmHg.  __________  Eugenie Birks MPI 02/10/2020: The study is normal. This is a low risk study. The left ventricular ejection fraction is normal (55-65%). LBBB __________  2D echo 01/24/2020: 1. Left ventricular ejection fraction, by estimation, is 55%. The left  ventricle has normal function. The left ventricle has no regional wall  motion abnormalities. There is mild left ventricular hypertrophy. Left  ventricular diastolic parameters are  consistent with Grade I diastolic dysfunction (impaired relaxation).   2. Right ventricular systolic function is normal. The right ventricular  size is normal. There is moderately elevated pulmonary artery systolic  pressure.    EKG:  EKG is ordered today.  The EKG ordered today demonstrates NSR, 62 bpm, LBBB  Recent Labs: 10/13/2023: BUN 29; Creatinine, Ser 1.80; Hemoglobin 13.9; Potassium 3.7; Sodium 145  Recent Lipid Panel No results found for: "CHOL", "TRIG", "HDL", "CHOLHDL", "VLDL", "LDLCALC", "LDLDIRECT"  PHYSICAL EXAM:    VS:  BP (!) 152/64   Pulse 62    Wt 192 lb 12.8 oz (87.5 kg)   SpO2 98%   BMI 30.20 kg/m   BMI: Body mass index is 30.2 kg/m.  Physical Exam Vitals reviewed.  Constitutional:      Appearance: He is well-developed.  HENT:     Head: Normocephalic and atraumatic.  Eyes:     General:        Right eye: No discharge.        Left eye: No discharge.  Neck:     Vascular: No JVD.  Cardiovascular:     Rate and Rhythm: Normal rate and regular rhythm.     Heart sounds: S1 normal and S2 normal. Heart sounds not distant. No midsystolic click and no opening snap. Murmur heard.     Systolic murmur is present with a grade of 1/6 at the lower left sternal border.     No friction rub.  Pulmonary:     Effort: Pulmonary effort is normal. No respiratory distress.     Breath sounds: Normal breath sounds. No decreased breath sounds, wheezing, rhonchi or rales.  Chest:     Chest wall: No tenderness.  Abdominal:     General: There is no distension.     Palpations: Abdomen is soft.     Tenderness: There is no abdominal tenderness.  Musculoskeletal:     Cervical back: Normal range of motion.     Comments: Trivial bilateral pretibial edema.  Skin:    General: Skin is warm and dry.     Nails: There is no clubbing.  Neurological:     Mental Status: He is alert and oriented to person, place, and time.  Psychiatric:        Speech: Speech normal.        Behavior: Behavior normal.        Thought Content: Thought content normal.        Judgment: Judgment normal.     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  In the setting of symptoms concerning for unstable angina and acute HFrEF, schedule R/LHC.  With underlying renal dysfunction, if PCI is indicated, may need to pursue staged  procedure.  Had aspirin 81 mg daily.  Not currently on statin secondary  to intolerance.  Continue Toprol-XL as outlined below.  Acute HFrEF: Does not appear grossly volume up at this time.  Weight is down 4 pounds today when compared to his last clinic visit in our office in 2021.  Stop telmisartan.  Reduce amlodipine to 5 mg today with plans to discontinue altogether in follow-up.  Start Entresto 49/51 mg twice daily.  Continue Toprol-XL 50 mg daily.  In follow-up, would also look to add SGLT2 inhibitor, particularly with underlying renal dysfunction.  May not be a good candidate for MRA with underlying renal dysfunction.  For now, remains on furosemide 20 mg daily.  Obtain BMP.  LBBB: Predates cardiomyopathy.  No symptoms of syncope.  Evaluation as above.  Mitral regurgitation: Mild to moderate by echo in 10/2023.  Monitor with periodic echo.  HTN: Blood pressure is mildly elevated in the office today, though well-controlled at home.  Pharmacotherapy as outlined above.  HLD with statin intolerance: No recent lipid panel available for review.  Intolerant to low, moderate, and high intensity statin secondary to myalgias.  Obtain fasting lipid panel at follow-up and if not recently obtained by PCP's office.  Will likely need to pursue PCSK9 inhibitor.  CKD stage III: Most recent serum creatinine 1.8 from 09/2023 with prior comparison from 2016 of 1.4 to 1.57.  Obtain BMP.   Informed Consent   Shared Decision Making/Informed Consent{  The risks [stroke (1 in 1000), death (1 in 1000), kidney failure [usually temporary] (1 in 500), bleeding (1 in 200), allergic reaction [possibly serious] (1 in 200)], benefits (diagnostic support and management of coronary artery disease) and alternatives of a cardiac catheterization were discussed in detail with Mr. Sanderfer and he is willing to proceed.        Disposition: F/u with Dr. Okey Dupre or an APP in 3 weeks.   Medication Adjustments/Labs and Tests  Ordered: Current medicines are reviewed at length with the patient today.  Concerns regarding medicines are outlined above. Medication changes, Labs and Tests ordered today are summarized above and listed in the Patient Instructions accessible in Encounters.   Signed, Eula Listen, PA-C 12/11/2023 1:41 PM     [facility] [place] [address] [place], [place] [address] [phone]

## 2023-12-08 NOTE — H&P (View-Only) (Signed)
 Cardiology Office Note    Date:  12/11/2023   ID:  Tyler David, DOB 07-Mar-1946, MRN [id number]  PCP:  Noberto Retort, MD  Cardiologist:  Yvonne Kendall, MD  Electrophysiologist:  None   Chief Complaint: Evaluation of abnormal echo  History of Present Illness:   Tyler David is a 78 y.o. male with history of coronary artery calcification noted on CT imaging, recently diagnosed cardiomyopathy, HTN, HLD, hypothyroidism, prostate cancer, CKD stage III, LBBB, GERD, and migraine headaches who presents for evaluation of recently diagnosed cardiomyopathy.  He was initially evaluated by our office in 12/2019 for palpitations and newly diagnosed LBBB.  At that time, he was asymptomatic other than sporadic palpitations.  Echo in 01/2020 showed an EF of 55%, no regional wall motion normalities, mild LVH, grade 1 diastolic dysfunction, normal RV systolic function and ventricular cavity size, and moderately elevated RVSP estimated at 41.4 mmHg.  Lexiscan MPI in 01/2020 was low risk with an EF of 55 to 65%.  LBBB noted.  He was last seen in the office in 03/2020 noting a 20 pound weight loss with dietary modification with associated improved blood pressure.  He reported minimal palpitations.  He was evaluated by his PCP's office in 09/2023 for URI and chest discomfort.  He was started on pantoprazole.  Workup at that time included a normal D-dimer, BNP 570, chest x-ray with possible pulmonary edema, and a CT of the chest showed pulmonary residual edema, coronary artery calcification, and cholelithiasis.  Chronic left rib fractures were also noted.  He was started on furosemide with some improvement in symptoms, though it ran out at time of his follow-up appointment with PCP in December.  At his last visit with PCP on 1/20 11/2023, he was restarted on furosemide 20 mg daily.  Echo in 10/2023 showed an EF of 35 to 40%, mild global hypokinesis with severe inferior wall hypokinesis, grade 1 diastolic  dysfunction, normal RV systolic function and ventricular cavity size, mild to moderate mitral regurgitation, and an estimated right atrial pressure of 3 mmHg.  He comes in accompanied by his wife today.  He history of exertional chest discomfort/burning occurring while mowing his lawn over the past 2 to 3 years.  More recently, he has noted a progression of his exertional chest discomfort/burning when ambulating uphill.  He is without symptoms of chest discomfort when ambulating on flat ground.  Symptoms do not occur at rest.  During this same timeframe, he also noted an increase in dyspnea with associated orthopnea.  Following the initiation of furosemide as outlined above, his dyspnea and orthopnea are improving.  He notes mild chronic bilateral lower extremity swelling with the right lower extremity historically slightly worse than the left.  His weight is down 4 pounds today when compared to his last visit in our office in 2021.  Notes good urine output on furosemide.  Drinking less than 2 L of liquid daily, not adding salt to food.   Labs independently reviewed: 09/2023 - potassium 3.7, BUN 29, serum creatinine 1.8, Hgb 13.9, BNP 570  Past Medical History:  Diagnosis Date   Arthritis    Cancer (HCC)    prostate cancer    Chronic kidney disease, stage 3 (HCC)    ED (erectile dysfunction)    GERD (gastroesophageal reflux disease)    Headache    hx of migraine-2015    Hyperlipidemia    Hypertension    Migraine    Nocturia    Overactive bladder  Pneumonia    hx of at age 56     Past Surgical History:  Procedure Laterality Date   PROSTATE SURGERY     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   Procedure: LEFT TOTAL HIP ARTHROPLASTY ANTERIOR APPROACH;  Surgeon: Durene Romans, MD;  Location: [facility];  Service: Orthopedics;  Laterality: Left;    Current Medications: Current Meds  Medication Sig   Cholecalciferol (VITAMIN D-3) 25 MCG (1000 UT) CAPS Take 1 capsule by mouth daily.    famotidine (PEPCID) 20 MG tablet Take 20 mg by mouth daily.   furosemide (LASIX) 20 MG tablet Take 20 mg by mouth daily.   levothyroxine (SYNTHROID) 50 MCG tablet Take 50 mcg by mouth daily before breakfast.   Multiple Vitamins-Minerals (ICAPS AREDS 2 PO) Take 2 capsules by mouth 2 (two) times daily as needed.   Omega-3 Fatty Acids (FISH OIL) 1000 MG CAPS Take by mouth daily.   sacubitril-valsartan (ENTRESTO) 49-51 MG Take 1 tablet by mouth 2 (two) times daily.   Turmeric 500 MG CAPS Take by mouth in the morning, at noon, and at bedtime.   [DISCONTINUED] amLODipine (NORVASC) 10 MG tablet Take 10 mg by mouth daily.   [DISCONTINUED] telmisartan (MICARDIS) 80 MG tablet Take 80 mg by mouth daily.    Allergies:   Atorvastatin, Pravachol [pravastatin], Simvastatin, Statins, and Penicillins   Social History   Socioeconomic History   Marital status: Married    Spouse name: Not on file   Number of children: Not on file   Years of education: Not on file   Highest education level: Not on file  Occupational History   Not on file  Tobacco Use   Smoking status: Never   Smokeless tobacco: Never  Substance and Sexual Activity   Alcohol use: Yes    Comment: rare   Drug use: No   Sexual activity: Not on file  Other Topics Concern   Not on file  Social History Narrative   Not on file   Social Drivers of Health   Financial Resource Strain: Not on file  Food Insecurity: Not on file  Transportation Needs: Not on file  Physical Activity: Not on file  Stress: Not on file  Social Connections: Not on file     Family History:  The patient's family history includes Atrial fibrillation in his brother; COPD in his father; Heart failure in his father; Ovarian cancer in his mother; Prostate cancer in his brother.  ROS:   12-point review of systems is negative unless otherwise noted in the HPI.   EKGs/Labs/Other Studies Reviewed:    Studies reviewed were summarized above. The additional studies  were reviewed today:  2D echo 11/09/2023: 1. Left ventricular ejection fraction, by estimation, is 35 to 40%. Left  ventricular ejection fraction by PLAX is 35 %. The left ventricle has  moderately decreased function. The left ventricle demonstrates mild global  hypokinesis with severe inferior  wall hypokinesis. Left ventricular diastolic parameters are consistent  with Grade I diastolic dysfunction (impaired relaxation).   2. Right ventricular systolic function is normal. The right ventricular  size is normal.   3. The mitral valve is normal in structure. Mild to moderate mitral valve  regurgitation. No evidence of mitral stenosis.   4. The aortic valve is normal in structure. Aortic valve regurgitation is  not visualized. No aortic stenosis is present.   5. The inferior vena cava is normal in size with greater than 50%  respiratory variability, suggesting right atrial pressure  of 3 mmHg.  __________  Eugenie Birks MPI 02/10/2020: The study is normal. This is a low risk study. The left ventricular ejection fraction is normal (55-65%). LBBB __________  2D echo 01/24/2020: 1. Left ventricular ejection fraction, by estimation, is 55%. The left  ventricle has normal function. The left ventricle has no regional wall  motion abnormalities. There is mild left ventricular hypertrophy. Left  ventricular diastolic parameters are  consistent with Grade I diastolic dysfunction (impaired relaxation).   2. Right ventricular systolic function is normal. The right ventricular  size is normal. There is moderately elevated pulmonary artery systolic  pressure.    EKG:  EKG is ordered today.  The EKG ordered today demonstrates NSR, 62 bpm, LBBB  Recent Labs: 10/13/2023: BUN 29; Creatinine, Ser 1.80; Hemoglobin 13.9; Potassium 3.7; Sodium 145  Recent Lipid Panel No results found for: "CHOL", "TRIG", "HDL", "CHOLHDL", "VLDL", "LDLCALC", "LDLDIRECT"  PHYSICAL EXAM:    VS:  BP (!) 152/64   Pulse 62    Wt 192 lb 12.8 oz (87.5 kg)   SpO2 98%   BMI 30.20 kg/m   BMI: Body mass index is 30.2 kg/m.  Physical Exam Vitals reviewed.  Constitutional:      Appearance: He is well-developed.  HENT:     Head: Normocephalic and atraumatic.  Eyes:     General:        Right eye: No discharge.        Left eye: No discharge.  Neck:     Vascular: No JVD.  Cardiovascular:     Rate and Rhythm: Normal rate and regular rhythm.     Heart sounds: S1 normal and S2 normal. Heart sounds not distant. No midsystolic click and no opening snap. Murmur heard.     Systolic murmur is present with a grade of 1/6 at the lower left sternal border.     No friction rub.  Pulmonary:     Effort: Pulmonary effort is normal. No respiratory distress.     Breath sounds: Normal breath sounds. No decreased breath sounds, wheezing, rhonchi or rales.  Chest:     Chest wall: No tenderness.  Abdominal:     General: There is no distension.     Palpations: Abdomen is soft.     Tenderness: There is no abdominal tenderness.  Musculoskeletal:     Cervical back: Normal range of motion.     Comments: Trivial bilateral pretibial edema.  Skin:    General: Skin is warm and dry.     Nails: There is no clubbing.  Neurological:     Mental Status: He is alert and oriented to person, place, and time.  Psychiatric:        Speech: Speech normal.        Behavior: Behavior normal.        Thought Content: Thought content normal.        Judgment: Judgment normal.     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  In the setting of symptoms concerning for unstable angina and acute HFrEF, schedule R/LHC.  With underlying renal dysfunction, if PCI is indicated, may need to pursue staged  procedure.  Had aspirin 81 mg daily.  Not currently on statin secondary  to intolerance.  Continue Toprol-XL as outlined below.  Acute HFrEF: Does not appear grossly volume up at this time.  Weight is down 4 pounds today when compared to his last clinic visit in our office in 2021.  Stop telmisartan.  Reduce amlodipine to 5 mg today with plans to discontinue altogether in follow-up.  Start Entresto 49/51 mg twice daily.  Continue Toprol-XL 50 mg daily.  In follow-up, would also look to add SGLT2 inhibitor, particularly with underlying renal dysfunction.  May not be a good candidate for MRA with underlying renal dysfunction.  For now, remains on furosemide 20 mg daily.  Obtain BMP.  LBBB: Predates cardiomyopathy.  No symptoms of syncope.  Evaluation as above.  Mitral regurgitation: Mild to moderate by echo in 10/2023.  Monitor with periodic echo.  HTN: Blood pressure is mildly elevated in the office today, though well-controlled at home.  Pharmacotherapy as outlined above.  HLD with statin intolerance: No recent lipid panel available for review.  Intolerant to low, moderate, and high intensity statin secondary to myalgias.  Obtain fasting lipid panel at follow-up and if not recently obtained by PCP's office.  Will likely need to pursue PCSK9 inhibitor.  CKD stage III: Most recent serum creatinine 1.8 from 09/2023 with prior comparison from 2016 of 1.4 to 1.57.  Obtain BMP.   Informed Consent   Shared Decision Making/Informed Consent{  The risks [stroke (1 in 1000), death (1 in 1000), kidney failure [usually temporary] (1 in 500), bleeding (1 in 200), allergic reaction [possibly serious] (1 in 200)], benefits (diagnostic support and management of coronary artery disease) and alternatives of a cardiac catheterization were discussed in detail with Mr. Sanderfer and he is willing to proceed.        Disposition: F/u with Dr. Okey Dupre or an APP in 3 weeks.   Medication Adjustments/Labs and Tests  Ordered: Current medicines are reviewed at length with the patient today.  Concerns regarding medicines are outlined above. Medication changes, Labs and Tests ordered today are summarized above and listed in the Patient Instructions accessible in Encounters.   Signed, Eula Listen, PA-C 12/11/2023 1:41 PM     [facility] [place] [address] [place], [place] [address] [phone]

## 2023-12-11 ENCOUNTER — Encounter: Payer: Self-pay | Admitting: Physician Assistant

## 2023-12-11 ENCOUNTER — Ambulatory Visit: Payer: PPO | Attending: Physician Assistant | Admitting: Physician Assistant

## 2023-12-11 VITALS — BP 152/64 | HR 62 | Wt 192.8 lb

## 2023-12-11 DIAGNOSIS — Z79899 Other long term (current) drug therapy: Secondary | ICD-10-CM

## 2023-12-11 DIAGNOSIS — I5020 Unspecified systolic (congestive) heart failure: Secondary | ICD-10-CM

## 2023-12-11 DIAGNOSIS — I25110 Atherosclerotic heart disease of native coronary artery with unstable angina pectoris: Secondary | ICD-10-CM | POA: Diagnosis not present

## 2023-12-11 DIAGNOSIS — R079 Chest pain, unspecified: Secondary | ICD-10-CM | POA: Diagnosis not present

## 2023-12-11 DIAGNOSIS — I340 Nonrheumatic mitral (valve) insufficiency: Secondary | ICD-10-CM | POA: Diagnosis not present

## 2023-12-11 DIAGNOSIS — I447 Left bundle-branch block, unspecified: Secondary | ICD-10-CM | POA: Diagnosis not present

## 2023-12-11 DIAGNOSIS — E785 Hyperlipidemia, unspecified: Secondary | ICD-10-CM

## 2023-12-11 DIAGNOSIS — Z789 Other specified health status: Secondary | ICD-10-CM

## 2023-12-11 DIAGNOSIS — N183 Chronic kidney disease, stage 3 unspecified: Secondary | ICD-10-CM

## 2023-12-11 DIAGNOSIS — I10 Essential (primary) hypertension: Secondary | ICD-10-CM

## 2023-12-11 MED ORDER — AMLODIPINE BESYLATE 5 MG PO TABS: 5.0000 mg | ORAL_TABLET | Freq: Every day | ORAL | 3 refills | Status: DC

## 2023-12-11 MED ORDER — SACUBITRIL-VALSARTAN 49-51 MG PO TABS: 1.0000 | ORAL_TABLET | Freq: Two times a day (BID) | ORAL | 3 refills | Status: DC

## 2023-12-11 NOTE — Patient Instructions (Signed)
 Medication Instructions:  Your physician recommends the following medication changes.  STOP TAKING: Telmisartan  START TAKING: Entresto 49/51 mg twice daily  DECREASE: Amlodipine 5 mg daily  *If you need a refill on your cardiac medications before your next appointment, please call your pharmacy*   Lab Work: Your provider would like for you to have following labs drawn today CBC and CMeT.   If you have labs (blood work) drawn today and your tests are completely normal, you will receive your results only by: MyChart Message (if you have MyChart) OR A paper copy in the mail If you have any lab test that is abnormal or we need to change your treatment, we will call you to review the results.   Testing/Procedures:  You are scheduled for a Cardiac Catheterization on Friday, February 28 with Dr. Cristal Deer End.  1. Please arrive at the [facility] Entrance of [facility], [address], [place] [address] at 6:30 AM (This is 1 hour(s) prior to your procedure time).  Proceed to the Check-In Desk directly inside the entrance.  Procedure Parking: Use the entrance off of the [address] side of the hospital. Turn right upon entering and follow the driveway to parking that is directly in front of the Heart [facility] Vascular Center. There is no valet parking available at this entrance, however there is an awning directly in front of the Heart & Vascular Center for drop off/ pick up for patients.  Special note: Every effort is made to have your procedure done on time. Please understand that emergencies sometimes delay scheduled procedures.  2. Diet: Do not eat solid foods after midnight.  The patient may have clear liquids until 5am upon the day of the procedure.  3. Labs: You will need to have blood drawn today.  4. Medication instructions in preparation for your procedure:   Contrast Allergy: No   Stop taking, Lasix (Furosemide)  and HTCZ (Hydrochlorothiazide) Friday, March 28,  -- please hold these the day of your catheterization.   On the morning of your procedure, take a Aspirin 81 mg.  You may use sips of water.  5. Plan to go home the same day, you will only stay overnight if medically necessary. 6. Bring a current list of your medications and current insurance cards. 7. You MUST have a responsible person to drive you home. 8. Someone MUST be with you the first 24 hours after you arrive home or your discharge will be delayed. 9. Please wear clothes that are easy to get on and off and wear slip-on shoes.  Thank you for allowing [place] to care for you!   --  Invasive Cardiovascular services   Follow-Up: At [facility], you and your health needs are our priority.  As part of our continuing mission to provide you with exceptional heart care, we have created designated Provider Care Teams.  These Care Teams include your primary Cardiologist (physician) and Advanced Practice Providers (APPs -  Physician Assistants and Nurse Practitioners) who all work together to provide you with the care you need, when you need it.   Your next appointment:   3 week(s)  Provider:   You may see Yvonne Kendall, MD or one of the following Advanced Practice Providers on your designated Care Team:   Nicolasa Ducking, NP Eula Listen, PA-C Cadence Fransico Michael, PA-C Charlsie Quest, NP Carlos Levering, NP

## 2023-12-12 ENCOUNTER — Other Ambulatory Visit: Payer: Self-pay | Admitting: Emergency Medicine

## 2023-12-12 LAB — CBC
Hematocrit: 44.1 % (ref 37.5–51.0)
Hemoglobin: 14.3 g/dL (ref 13.0–17.7)
MCH: 29.1 pg (ref 26.6–33.0)
MCHC: 32.4 g/dL (ref 31.5–35.7)
MCV: 90 fL (ref 79–97)
Platelets: 247 10*3/uL (ref 150–450)
RBC: 4.92 x10E6/uL (ref 4.14–5.80)
RDW: 12.1 % (ref 11.6–15.4)
WBC: 8.5 10*3/uL (ref 3.4–10.8)

## 2023-12-12 LAB — COMPREHENSIVE METABOLIC PANEL
ALT: 14 IU/L (ref 0–44)
AST: 19 IU/L (ref 0–40)
Albumin: 4 g/dL (ref 3.8–4.8)
Alkaline Phosphatase: 69 IU/L (ref 44–121)
BUN/Creatinine Ratio: 21 (ref 10–24)
BUN: 38 mg/dL — ABNORMAL HIGH (ref 8–27)
Bilirubin Total: 0.7 mg/dL (ref 0.0–1.2)
CO2: 23 mmol/L (ref 20–29)
Calcium: 8.9 mg/dL (ref 8.6–10.2)
Chloride: 104 mmol/L (ref 96–106)
Creatinine, Ser: 1.77 mg/dL — ABNORMAL HIGH (ref 0.76–1.27)
Globulin, Total: 2.5 g/dL (ref 1.5–4.5)
Glucose: 94 mg/dL (ref 70–99)
Potassium: 4.9 mmol/L (ref 3.5–5.2)
Sodium: 141 mmol/L (ref 134–144)
Total Protein: 6.5 g/dL (ref 6.0–8.5)
eGFR: 39 mL/min/{1.73_m2} — ABNORMAL LOW (ref >59)

## 2023-12-12 MED ORDER — FUROSEMIDE 20 MG PO TABS: 20.0000 mg | ORAL_TABLET | ORAL | 3 refills | Status: DC

## 2023-12-13 DIAGNOSIS — L905 Scar conditions and fibrosis of skin: Secondary | ICD-10-CM | POA: Diagnosis not present

## 2023-12-15 ENCOUNTER — Encounter: Payer: Self-pay | Admitting: Internal Medicine

## 2023-12-15 ENCOUNTER — Ambulatory Visit
Admission: RE | Admit: 2023-12-15 | Discharge: 2023-12-15 | Disposition: A | Discharge status: Home / Self Care | Payer: PPO | Attending: Internal Medicine | Admitting: Internal Medicine

## 2023-12-15 ENCOUNTER — Other Ambulatory Visit: Payer: Self-pay

## 2023-12-15 ENCOUNTER — Encounter
Admission: RE | Disposition: A | Discharge status: Home / Self Care | Payer: Self-pay | Source: Home / Self Care | Attending: Internal Medicine

## 2023-12-15 DIAGNOSIS — M791 Myalgia, unspecified site: Secondary | ICD-10-CM | POA: Diagnosis not present

## 2023-12-15 DIAGNOSIS — I340 Nonrheumatic mitral (valve) insufficiency: Secondary | ICD-10-CM | POA: Insufficient documentation

## 2023-12-15 DIAGNOSIS — I5020 Unspecified systolic (congestive) heart failure: Secondary | ICD-10-CM

## 2023-12-15 DIAGNOSIS — I130 Hypertensive heart and chronic kidney disease with heart failure and stage 1 through stage 4 chronic kidney disease, or unspecified chronic kidney disease: Secondary | ICD-10-CM | POA: Insufficient documentation

## 2023-12-15 DIAGNOSIS — Z8249 Family history of ischemic heart disease and other diseases of the circulatory system: Secondary | ICD-10-CM | POA: Insufficient documentation

## 2023-12-15 DIAGNOSIS — I209 Angina pectoris, unspecified: Secondary | ICD-10-CM

## 2023-12-15 DIAGNOSIS — I25119 Atherosclerotic heart disease of native coronary artery with unspecified angina pectoris: Secondary | ICD-10-CM | POA: Diagnosis not present

## 2023-12-15 DIAGNOSIS — I25110 Atherosclerotic heart disease of native coronary artery with unstable angina pectoris: Secondary | ICD-10-CM | POA: Insufficient documentation

## 2023-12-15 DIAGNOSIS — E785 Hyperlipidemia, unspecified: Secondary | ICD-10-CM | POA: Diagnosis not present

## 2023-12-15 DIAGNOSIS — Z7982 Long term (current) use of aspirin: Secondary | ICD-10-CM | POA: Diagnosis not present

## 2023-12-15 DIAGNOSIS — I447 Left bundle-branch block, unspecified: Secondary | ICD-10-CM | POA: Insufficient documentation

## 2023-12-15 DIAGNOSIS — N183 Chronic kidney disease, stage 3 unspecified: Secondary | ICD-10-CM | POA: Diagnosis not present

## 2023-12-15 DIAGNOSIS — I5021 Acute systolic (congestive) heart failure: Secondary | ICD-10-CM | POA: Insufficient documentation

## 2023-12-15 DIAGNOSIS — Z79899 Other long term (current) drug therapy: Secondary | ICD-10-CM | POA: Diagnosis not present

## 2023-12-15 DIAGNOSIS — I200 Unstable angina: Secondary | ICD-10-CM

## 2023-12-15 HISTORY — PX: RIGHT/LEFT HEART CATH AND CORONARY ANGIOGRAPHY: CATH118266

## 2023-12-15 LAB — POCT I-STAT 7, (LYTES, BLD GAS, ICA,H+H)
Acid-base deficit: 3 mmol/L — ABNORMAL HIGH (ref 0.0–2.0)
Bicarbonate: 22.9 mmol/L (ref 20.0–28.0)
Calcium, Ion: 1.14 mmol/L — ABNORMAL LOW (ref 1.15–1.40)
HCT: 39 % (ref 39.0–52.0)
Hemoglobin: 13.3 g/dL (ref 13.0–17.0)
O2 Saturation: 91 %
Potassium: 3.7 mmol/L (ref 3.5–5.1)
Sodium: 142 mmol/L (ref 135–145)
TCO2: 24 mmol/L (ref 22–32)
pCO2 arterial: 41.8 mmHg (ref 32–48)
pH, Arterial: 7.347 — ABNORMAL LOW (ref 7.35–7.45)
pO2, Arterial: 64 mmHg — ABNORMAL LOW (ref 83–108)

## 2023-12-15 LAB — POCT I-STAT EG7
Acid-base deficit: 2 mmol/L (ref 0.0–2.0)
Bicarbonate: 24.3 mmol/L (ref 20.0–28.0)
Calcium, Ion: 1.15 mmol/L (ref 1.15–1.40)
HCT: 40 % (ref 39.0–52.0)
Hemoglobin: 13.6 g/dL (ref 13.0–17.0)
O2 Saturation: 66 %
Potassium: 3.7 mmol/L (ref 3.5–5.1)
Sodium: 143 mmol/L (ref 135–145)
TCO2: 26 mmol/L (ref 22–32)
pCO2, Ven: 46.7 mmHg (ref 44–60)
pH, Ven: 7.325 (ref 7.25–7.43)
pO2, Ven: 37 mmHg (ref 32–45)

## 2023-12-15 SURGERY — RIGHT/LEFT HEART CATH AND CORONARY ANGIOGRAPHY
Anesthesia: Moderate Sedation | Laterality: Bilateral

## 2023-12-15 MED ORDER — SODIUM CHLORIDE 0.9 % IV SOLN: INTRAVENOUS | Status: DC

## 2023-12-15 MED ORDER — HEPARIN SODIUM (PORCINE) 1000 UNIT/ML IJ SOLN
INTRAMUSCULAR | Status: AC
Start: 2023-12-15 — End: ?
  Filled 2023-12-15: qty 10

## 2023-12-15 MED ORDER — SODIUM CHLORIDE 0.9% FLUSH: 3.0000 mL | Freq: Two times a day (BID) | INTRAVENOUS | Status: DC

## 2023-12-15 MED ORDER — ASPIRIN 81 MG PO CHEW: 81.0000 mg | CHEWABLE_TABLET | ORAL | Status: DC

## 2023-12-15 MED ORDER — LABETALOL HCL 5 MG/ML IV SOLN: 10.0000 mg | INTRAVENOUS | Status: DC | PRN

## 2023-12-15 MED ORDER — LIDOCAINE HCL (PF) 1 % IJ SOLN
INTRAMUSCULAR | Status: DC | PRN
  Administered 2023-12-15 (×2): 2 mL

## 2023-12-15 MED ORDER — VERAPAMIL HCL 2.5 MG/ML IV SOLN
INTRAVENOUS | Status: AC
  Filled 2023-12-15: qty 2

## 2023-12-15 MED ORDER — HEPARIN SODIUM (PORCINE) 1000 UNIT/ML IJ SOLN
INTRAMUSCULAR | Status: DC | PRN
  Administered 2023-12-15: 4500 [IU] via INTRAVENOUS

## 2023-12-15 MED ORDER — ACETAMINOPHEN 325 MG PO TABS: 650.0000 mg | ORAL_TABLET | ORAL | Status: DC | PRN

## 2023-12-15 MED ORDER — SODIUM CHLORIDE 0.9% FLUSH: 3.0000 mL | INTRAVENOUS | Status: DC | PRN

## 2023-12-15 MED ORDER — MIDAZOLAM HCL 2 MG/2ML IJ SOLN
INTRAMUSCULAR | Status: DC | PRN
  Administered 2023-12-15: 1 mg via INTRAVENOUS

## 2023-12-15 MED ORDER — HYDRALAZINE HCL 20 MG/ML IJ SOLN: 10.0000 mg | INTRAMUSCULAR | Status: DC | PRN

## 2023-12-15 MED ORDER — NITROGLYCERIN 0.4 MG SL SUBL: 0.4000 mg | SUBLINGUAL_TABLET | SUBLINGUAL | 99 refills | Status: DC | PRN

## 2023-12-15 MED ORDER — FUROSEMIDE 20 MG PO TABS
20.0000 mg | ORAL_TABLET | Freq: Every day | ORAL | Status: DC | PRN
Start: 2023-12-15 — End: 2024-01-12

## 2023-12-15 MED ORDER — MIDAZOLAM HCL 2 MG/2ML IJ SOLN
INTRAMUSCULAR | Status: AC
Start: 2023-12-15 — End: ?
  Filled 2023-12-15: qty 2

## 2023-12-15 MED ORDER — VERAPAMIL HCL 2.5 MG/ML IV SOLN
INTRAVENOUS | Status: DC | PRN
  Administered 2023-12-15 (×2): 2.5 mg via INTRA_ARTERIAL

## 2023-12-15 MED ORDER — HEPARIN (PORCINE) IN NACL 1000-0.9 UT/500ML-% IV SOLN
INTRAVENOUS | Status: DC | PRN
  Administered 2023-12-15: 1000 mL

## 2023-12-15 MED ORDER — ONDANSETRON HCL 4 MG/2ML IJ SOLN: 4.0000 mg | Freq: Four times a day (QID) | INTRAMUSCULAR | Status: DC | PRN

## 2023-12-15 MED ORDER — SODIUM CHLORIDE 0.9 % IV BOLUS
INTRAVENOUS | Status: DC | PRN
  Administered 2023-12-15: 250 mL via INTRAVENOUS

## 2023-12-15 MED ORDER — HEPARIN (PORCINE) IN NACL 1000-0.9 UT/500ML-% IV SOLN
INTRAVENOUS | Status: AC
  Filled 2023-12-15: qty 1000

## 2023-12-15 MED ORDER — SODIUM CHLORIDE 0.9 % IV SOLN: 250.0000 mL | INTRAVENOUS | Status: DC | PRN

## 2023-12-15 MED ORDER — FENTANYL CITRATE (PF) 100 MCG/2ML IJ SOLN
INTRAMUSCULAR | Status: AC
  Filled 2023-12-15: qty 2

## 2023-12-15 MED ORDER — IOHEXOL 300 MG/ML  SOLN
INTRAMUSCULAR | Status: DC | PRN
  Administered 2023-12-15: 28 mL

## 2023-12-15 MED ORDER — FENTANYL CITRATE (PF) 100 MCG/2ML IJ SOLN
INTRAMUSCULAR | Status: DC | PRN
  Administered 2023-12-15: 25 ug via INTRAVENOUS

## 2023-12-15 SURGICAL SUPPLY — 14 items
CATH 5FR JL3.5 JR4 ANG PIG MP (CATHETERS) IMPLANT
CATH BALLN WEDGE 5F 110CM (CATHETERS) IMPLANT
DEVICE RAD TR BAND REGULAR (VASCULAR PRODUCTS) IMPLANT
DRAPE BRACHIAL (DRAPES) IMPLANT
GLIDESHEATH SLEND SS 6F .021 (SHEATH) IMPLANT
GUIDEWIRE INQWIRE 1.5J.035X260 (WIRE) IMPLANT
INQWIRE 1.5J .035X260CM (WIRE) ×1 IMPLANT
KIT SYRINGE INJ CVI SPIKEX1 (MISCELLANEOUS) IMPLANT
PACK CARDIAC CATH (CUSTOM PROCEDURE TRAY) ×1 IMPLANT
PAD ELECT DEFIB RADIOL ZOLL (MISCELLANEOUS) IMPLANT
PROTECTION STATION PRESSURIZED (MISCELLANEOUS) ×1 IMPLANT
SET ATX-X65L (MISCELLANEOUS) IMPLANT
SHEATH GLIDE SLENDER 4/5FR (SHEATH) IMPLANT
STATION PROTECTION PRESSURIZED (MISCELLANEOUS) IMPLANT

## 2023-12-15 NOTE — Interval H&P Note (Signed)
 History and Physical Interval Note:  12/15/2023 7:43 AM  Tyler David  has presented today for surgery, with the diagnosis of R and L Cath    Unstable angina.  The various methods of treatment have been discussed with the patient and family. After consideration of risks, benefits and other options for treatment, the patient has consented to  Procedure(s): RIGHT/LEFT HEART CATH AND CORONARY ANGIOGRAPHY (Bilateral) as a surgical intervention.  The patient's history has been reviewed, patient examined, no change in status, stable for surgery.  I have reviewed the patient's chart and labs.  Questions were answered to the patient's satisfaction.    Cath Lab Visit (complete for each Cath Lab visit)  Clinical Evaluation Leading to the Procedure:   ACS: No.  Non-ACS:    Anginal Classification: CCS III  Anti-ischemic medical therapy: Maximal Therapy (2 or more classes of medications)  Non-Invasive Test Results: No non-invasive testing performed (LVEF 35-40% by echo -> intermediate risk)  Prior CABG: No previous CABG  Tyler David

## 2023-12-15 NOTE — Discharge Instructions (Signed)
 Radial Site Care Refer to this sheet in the next few weeks. These instructions provide you with information about caring for yourself after your procedure. Your health care provider may also give you more specific instructions. Your treatment has been planned according to current medical practices, but problems sometimes occur. Call your health care provider if you have any problems or questions after your procedure. What can I expect after the procedure? After your procedure, it is typical to have the following: Bruising at the radial site that usually fades within 1-2 weeks. Blood collecting in the tissue (hematoma) that may be painful to the touch. It should usually decrease in size and tenderness within 1-2 weeks.  Follow these instructions at home: Take medicines only as directed by your health care provider. If you are on a medication called Metformin please do not take for 48 hours after your procedure. Over the next 48hrs please increase your fluid intake of water and non caffeine beverages to flush the contrast dye out of your system.  You may shower 24 hours after the procedure  Leave your bandage on and gently wash the site with plain soap and water. Pat the area dry with a clean towel. Do not rub the site, because this may cause bleeding.  Remove your dressing 48hrs after your procedure and leave open to air.  Do not submerge your site in water for 7 days. This includes swimming and washing dishes.  Check your insertion site every day for redness, swelling, or drainage. Do not apply powder or lotion to the site. Do not flex or bend the affected arm for 24 hours or as directed by your health care provider. Do not push or pull heavy objects with the affected arm for 24 hours or as directed by your health care provider. Do not lift over 10 lb (4.5 kg) for 5 days after your procedure or as directed by your health care provider. Ask your health care provider when it is okay to: Return to  work or school. Resume usual physical activities or sports. Resume sexual activity. Do not drive home if you are discharged the same day as the procedure. Have someone else drive you. You may drive 48 hours after the procedure Do not operate machinery or power tools for 24 hours after the procedure. If your procedure was done as an outpatient procedure, which means that you went home the same day as your procedure, a responsible adult should be with you for the first 24 hours after you arrive home. Keep all follow-up visits as directed by your health care provider. This is important. Contact a health care provider if: You have a fever. You have chills. You have increased bleeding from the radial site. Hold pressure on the site. Get help right away if: You have unusual pain at the radial site. You have redness, warmth, or swelling at the radial site. You have drainage (other than a small amount of blood on the dressing) from the radial site. The radial site is bleeding, and the bleeding does not stop after 15 minutes of holding steady pressure on the site. Your arm or hand becomes pale, cool, tingly, or numb. This information is not intended to replace advice given to you by your health care provider. Make sure you discuss any questions you have with your health care provider. Document Released: 11/05/2010 Document Revised: 03/10/2016 Document Reviewed: 04/21/2014 Elsevier Interactive Patient Education  2018 [employer].

## 2023-12-29 ENCOUNTER — Other Ambulatory Visit: Payer: Self-pay | Admitting: *Deleted

## 2023-12-29 ENCOUNTER — Encounter: Payer: Self-pay | Admitting: *Deleted

## 2023-12-29 ENCOUNTER — Institutional Professional Consult (permissible substitution): Payer: PPO | Admitting: Thoracic Surgery (Cardiothoracic Vascular Surgery)

## 2023-12-29 VITALS — BP 150/67 | HR 67 | Resp 18 | Ht 68.0 in | Wt 194.0 lb

## 2023-12-29 DIAGNOSIS — I2510 Atherosclerotic heart disease of native coronary artery without angina pectoris: Secondary | ICD-10-CM

## 2023-12-29 NOTE — Progress Notes (Signed)
 [address]       [place] [address]             [phone]        Tyler David [place] Medical Record #[id number] Date of Birth: [birth year]  Referring: Yvonne Kendall, MD Primary Care: Noberto Retort, MD Primary Cardiologist:Christopher End, MD  Chief Complaint:    Chief Complaint  Patient presents with   Coronary Artery Disease    History of Present Illness:     Tyler David 78 y.o. male presents for surgical evaluation of three-vessel coronary artery disease.  He states that he started developing some shortness of breath and exertional chest and neck pain about 2 years ago while mowing his lawn.  This has progressed.  He denies any rest pain, but states that the symptoms are recreated walking briskly or up a hill.  He recently was diagnosed with congestive heart failure, and placed on diuretics.   Past Medical and Surgical History: Previous Chest Surgery: No Previous Chest Radiation: No Diabetes Mellitus: No.  HbA1C pending Creatinine:  Lab Results  Component Value Date   CREATININE 1.77 (H) 12/11/2023   CREATININE 1.80 (H) 10/13/2023   CREATININE 1.57 (H) 10/17/2015     Past Medical History:  Diagnosis Date   Arthritis    Cancer (HCC)    prostate cancer    Chronic kidney disease, stage 3 (HCC)    ED (erectile dysfunction)    GERD (gastroesophageal reflux disease)    Headache    hx of migraine-2015    Hyperlipidemia    Hypertension    Migraine    Nocturia    Overactive bladder    Pneumonia    hx of at age 75     Past Surgical History:  Procedure Laterality Date   PROSTATE SURGERY     RIGHT/LEFT HEART CATH AND CORONARY ANGIOGRAPHY Bilateral 12/15/2023   Procedure: RIGHT/LEFT HEART CATH AND CORONARY ANGIOGRAPHY;  Surgeon: Yvonne Kendall, MD;  Location: [facility] INVASIVE CV LAB;  Service: Cardiovascular;  Laterality: Bilateral;   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   Procedure: LEFT TOTAL HIP ARTHROPLASTY ANTERIOR  APPROACH;  Surgeon: Durene Romans, MD;  Location: [facility];  Service: Orthopedics;  Laterality: Left;    Social History:  Social History   Tobacco Use  Smoking Status Never  Smokeless Tobacco Never    Social History   Substance and Sexual Activity  Alcohol Use Yes   Comment: rare     Allergies  Allergen Reactions   Atorvastatin     myalgias  Other Reaction(s): myalgias   Pravachol [Pravastatin]     Muscle aches   Simvastatin    Statins Other (See Comments)    Muscle pain   Penicillins Rash    Has patient had a PCN reaction causing immediate rash, facial/tongue/throat swelling, SOB or lightheadedness with hypotension: No Has patient had a PCN reaction causing severe rash involving mucus membranes or skin necrosis: No Has patient had a PCN reaction that required hospitalization No Has patient had a PCN reaction occurring within the last 10 years: No If all of the above answers are "NO", then may proceed with Cephalosporin use.       Current Outpatient Medications  Medication Sig Dispense Refill   amLODipine (NORVASC) 5 MG tablet Take 1 tablet (5 mg total) by mouth daily. 90 tablet 3   Cholecalciferol (VITAMIN D-3) 25 MCG (1000 UT) CAPS Take 1,000 Units by mouth daily.  famotidine (PEPCID) 20 MG tablet Take 20 mg by mouth daily.     furosemide (LASIX) 20 MG tablet Take 1 tablet (20 mg total) by mouth daily as needed (swelling or weight gain (2 pounds in 24 hours or 5 pounds in 1 week)).     levothyroxine (SYNTHROID) 50 MCG tablet Take 50 mcg by mouth daily before breakfast.     MAGNESIUM PO Take 420 mg by mouth daily.     metoprolol succinate (TOPROL-XL) 50 MG 24 hr tablet Take 50 mg by mouth at bedtime. Take with or immediately following a meal.     Multiple Vitamins-Minerals (ICAPS AREDS 2 PO) Take 1 capsule by mouth 2 (two) times daily.     nitroGLYCERIN (NITROSTAT) 0.4 MG SL tablet Place 1 tablet (0.4 mg total) under the tongue every 5 (five) minutes as needed for  chest pain. 25 tablet PRN   Omega-3 Fatty Acids (FISH OIL) 1000 MG CAPS Take 1,000 mg by mouth daily.     sacubitril-valsartan (ENTRESTO) 49-51 MG Take 1 tablet by mouth 2 (two) times daily. 180 tablet 3   Turmeric 500 MG CAPS Take 1,000 mg by mouth 2 (two) times daily.     No current facility-administered medications for this visit.    (Not in a hospital admission)   Family History  Problem Relation Age of Onset   Ovarian cancer Mother    Heart failure Father    COPD Father    Atrial fibrillation Brother    Prostate cancer Brother      Review of Systems:   Review of Systems  Constitutional:  Positive for malaise/fatigue.  Respiratory:  Positive for shortness of breath.   Cardiovascular:  Positive for chest pain.  Neurological: Negative.       Physical Exam: BP (!) 150/67 (BP Location: Left Arm)   Pulse 67   Resp 18   Ht 5\' 8"  (1.727 m)   Wt 194 lb (88 kg)   SpO2 96%   BMI 29.50 kg/m  Physical Exam Constitutional:      General: He is not in acute distress.    Appearance: He is not ill-appearing.  Eyes:     Extraocular Movements: Extraocular movements intact.  Cardiovascular:     Rate and Rhythm: Normal rate.  Pulmonary:     Effort: Pulmonary effort is normal. No respiratory distress.  Abdominal:     General: There is no distension.  Musculoskeletal:        General: Normal range of motion.     Cervical back: Normal range of motion.  Skin:    General: Skin is warm and dry.  Neurological:     General: No focal deficit present.     Mental Status: He is alert and oriented to person, place, and time.       Diagnostic Studies & Laboratory data:    Left Heart Catherization:  Intervention  Echo: IMPRESSIONS     1. Left ventricular ejection fraction, by estimation, is 35 to 40%. Left  ventricular ejection fraction by PLAX is 35 %. The left ventricle has  moderately decreased function. The left ventricle demonstrates mild global  hypokinesis with  severe inferior  wall hypokinesis. Left ventricular diastolic parameters are consistent  with Grade I diastolic dysfunction (impaired relaxation).   2. Right ventricular systolic function is normal. The right ventricular  size is normal.   3. The mitral valve is normal in structure. Mild to moderate mitral valve  regurgitation. No evidence of mitral stenosis.  4. The aortic valve is normal in structure. Aortic valve regurgitation is  not visualized. No aortic stenosis is present.   5. The inferior vena cava is normal in size with greater than 50%  respiratory variability, suggesting right atrial pressure of 3 mmHg.     EKG: Sinus  I have independently reviewed the above radiologic studies and discussed with the patient   Recent Lab Findings: Lab Results  Component Value Date   WBC 8.5 12/11/2023   HGB 13.3 12/15/2023   HCT 39.0 12/15/2023   PLT 247 12/11/2023   GLUCOSE 94 12/11/2023   ALT 14 12/11/2023   AST 19 12/11/2023   NA 142 12/15/2023   K 3.7 12/15/2023   CL 104 12/11/2023   CREATININE 1.77 (H) 12/11/2023   BUN 38 (H) 12/11/2023   CO2 23 12/11/2023   INR 1.03 10/07/2015      Assessment / Plan:   78 year old male with three-vessel coronary artery disease.  He also has reduced LV function with an EF of 35%.  Echocardiogram reveals mild to moderate mitral valve regurgitation, but this appears mostly central.  He also has chronic renal insufficiency with a creatinine of 1.7.  On review of his left heart cath, he has good targets in his LAD, and obtuse marginal.  The PDA fills with left-to-right collaterals, and is also potentially a good target.  We discussed the risks and benefits of surgical revascularization.  He is agreed to proceed.  CABG 3.    I  spent 40 minutes counseling the patient face to face.   Corliss Skains 12/29/2023 9:54 AM

## 2023-12-29 NOTE — H&P (View-Only) (Signed)
 [address]       [place] [address]             [phone]        Tyler David [place] Medical Record #[id number] Date of Birth: [birth year]  Referring: Yvonne Kendall, MD Primary Care: Noberto Retort, MD Primary Cardiologist:Christopher End, MD  Chief Complaint:    Chief Complaint  Patient presents with   Coronary Artery Disease    History of Present Illness:     Tyler David 78 y.o. male presents for surgical evaluation of three-vessel coronary artery disease.  He states that he started developing some shortness of breath and exertional chest and neck pain about 2 years ago while mowing his lawn.  This has progressed.  He denies any rest pain, but states that the symptoms are recreated walking briskly or up a hill.  He recently was diagnosed with congestive heart failure, and placed on diuretics.   Past Medical and Surgical History: Previous Chest Surgery: No Previous Chest Radiation: No Diabetes Mellitus: No.  HbA1C pending Creatinine:  Lab Results  Component Value Date   CREATININE 1.77 (H) 12/11/2023   CREATININE 1.80 (H) 10/13/2023   CREATININE 1.57 (H) 10/17/2015     Past Medical History:  Diagnosis Date   Arthritis    Cancer (HCC)    prostate cancer    Chronic kidney disease, stage 3 (HCC)    ED (erectile dysfunction)    GERD (gastroesophageal reflux disease)    Headache    hx of migraine-2015    Hyperlipidemia    Hypertension    Migraine    Nocturia    Overactive bladder    Pneumonia    hx of at age 75     Past Surgical History:  Procedure Laterality Date   PROSTATE SURGERY     RIGHT/LEFT HEART CATH AND CORONARY ANGIOGRAPHY Bilateral 12/15/2023   Procedure: RIGHT/LEFT HEART CATH AND CORONARY ANGIOGRAPHY;  Surgeon: Yvonne Kendall, MD;  Location: [facility] INVASIVE CV LAB;  Service: Cardiovascular;  Laterality: Bilateral;   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   Procedure: LEFT TOTAL HIP ARTHROPLASTY ANTERIOR  APPROACH;  Surgeon: Durene Romans, MD;  Location: [facility];  Service: Orthopedics;  Laterality: Left;    Social History:  Social History   Tobacco Use  Smoking Status Never  Smokeless Tobacco Never    Social History   Substance and Sexual Activity  Alcohol Use Yes   Comment: rare     Allergies  Allergen Reactions   Atorvastatin     myalgias  Other Reaction(s): myalgias   Pravachol [Pravastatin]     Muscle aches   Simvastatin    Statins Other (See Comments)    Muscle pain   Penicillins Rash    Has patient had a PCN reaction causing immediate rash, facial/tongue/throat swelling, SOB or lightheadedness with hypotension: No Has patient had a PCN reaction causing severe rash involving mucus membranes or skin necrosis: No Has patient had a PCN reaction that required hospitalization No Has patient had a PCN reaction occurring within the last 10 years: No If all of the above answers are "NO", then may proceed with Cephalosporin use.       Current Outpatient Medications  Medication Sig Dispense Refill   amLODipine (NORVASC) 5 MG tablet Take 1 tablet (5 mg total) by mouth daily. 90 tablet 3   Cholecalciferol (VITAMIN D-3) 25 MCG (1000 UT) CAPS Take 1,000 Units by mouth daily.  famotidine (PEPCID) 20 MG tablet Take 20 mg by mouth daily.     furosemide (LASIX) 20 MG tablet Take 1 tablet (20 mg total) by mouth daily as needed (swelling or weight gain (2 pounds in 24 hours or 5 pounds in 1 week)).     levothyroxine (SYNTHROID) 50 MCG tablet Take 50 mcg by mouth daily before breakfast.     MAGNESIUM PO Take 420 mg by mouth daily.     metoprolol succinate (TOPROL-XL) 50 MG 24 hr tablet Take 50 mg by mouth at bedtime. Take with or immediately following a meal.     Multiple Vitamins-Minerals (ICAPS AREDS 2 PO) Take 1 capsule by mouth 2 (two) times daily.     nitroGLYCERIN (NITROSTAT) 0.4 MG SL tablet Place 1 tablet (0.4 mg total) under the tongue every 5 (five) minutes as needed for  chest pain. 25 tablet PRN   Omega-3 Fatty Acids (FISH OIL) 1000 MG CAPS Take 1,000 mg by mouth daily.     sacubitril-valsartan (ENTRESTO) 49-51 MG Take 1 tablet by mouth 2 (two) times daily. 180 tablet 3   Turmeric 500 MG CAPS Take 1,000 mg by mouth 2 (two) times daily.     No current facility-administered medications for this visit.    (Not in a hospital admission)   Family History  Problem Relation Age of Onset   Ovarian cancer Mother    Heart failure Father    COPD Father    Atrial fibrillation Brother    Prostate cancer Brother      Review of Systems:   Review of Systems  Constitutional:  Positive for malaise/fatigue.  Respiratory:  Positive for shortness of breath.   Cardiovascular:  Positive for chest pain.  Neurological: Negative.       Physical Exam: BP (!) 150/67 (BP Location: Left Arm)   Pulse 67   Resp 18   Ht 5\' 8"  (1.727 m)   Wt 194 lb (88 kg)   SpO2 96%   BMI 29.50 kg/m  Physical Exam Constitutional:      General: He is not in acute distress.    Appearance: He is not ill-appearing.  Eyes:     Extraocular Movements: Extraocular movements intact.  Cardiovascular:     Rate and Rhythm: Normal rate.  Pulmonary:     Effort: Pulmonary effort is normal. No respiratory distress.  Abdominal:     General: There is no distension.  Musculoskeletal:        General: Normal range of motion.     Cervical back: Normal range of motion.  Skin:    General: Skin is warm and dry.  Neurological:     General: No focal deficit present.     Mental Status: He is alert and oriented to person, place, and time.       Diagnostic Studies & Laboratory data:    Left Heart Catherization:  Intervention  Echo: IMPRESSIONS     1. Left ventricular ejection fraction, by estimation, is 35 to 40%. Left  ventricular ejection fraction by PLAX is 35 %. The left ventricle has  moderately decreased function. The left ventricle demonstrates mild global  hypokinesis with  severe inferior  wall hypokinesis. Left ventricular diastolic parameters are consistent  with Grade I diastolic dysfunction (impaired relaxation).   2. Right ventricular systolic function is normal. The right ventricular  size is normal.   3. The mitral valve is normal in structure. Mild to moderate mitral valve  regurgitation. No evidence of mitral stenosis.  4. The aortic valve is normal in structure. Aortic valve regurgitation is  not visualized. No aortic stenosis is present.   5. The inferior vena cava is normal in size with greater than 50%  respiratory variability, suggesting right atrial pressure of 3 mmHg.     EKG: Sinus  I have independently reviewed the above radiologic studies and discussed with the patient   Recent Lab Findings: Lab Results  Component Value Date   WBC 8.5 12/11/2023   HGB 13.3 12/15/2023   HCT 39.0 12/15/2023   PLT 247 12/11/2023   GLUCOSE 94 12/11/2023   ALT 14 12/11/2023   AST 19 12/11/2023   NA 142 12/15/2023   K 3.7 12/15/2023   CL 104 12/11/2023   CREATININE 1.77 (H) 12/11/2023   BUN 38 (H) 12/11/2023   CO2 23 12/11/2023   INR 1.03 10/07/2015      Assessment / Plan:   78 year old male with three-vessel coronary artery disease.  He also has reduced LV function with an EF of 35%.  Echocardiogram reveals mild to moderate mitral valve regurgitation, but this appears mostly central.  He also has chronic renal insufficiency with a creatinine of 1.7.  On review of his left heart cath, he has good targets in his LAD, and obtuse marginal.  The PDA fills with left-to-right collaterals, and is also potentially a good target.  We discussed the risks and benefits of surgical revascularization.  He is agreed to proceed.  CABG 3.    I  spent 40 minutes counseling the patient face to face.   Corliss Skains 12/29/2023 9:54 AM

## 2024-01-01 NOTE — Pre-Procedure Instructions (Signed)
 Surgical Instructions   Your procedure is scheduled on Thursday, March 20th. Report to [facility] Main Entrance "A" at 05:30 A.M., then check in with the Admitting office. Any questions or running late day of surgery: call [phone]  Questions prior to your surgery date: call [phone], Monday-Friday, 8am-4pm. If you experience any cold or flu symptoms such as cough, fever, chills, shortness of breath, etc. between now and your scheduled surgery, please notify us at the above number.     Remember:  Do not eat or drink after midnight the night before your surgery    Take these medicines the morning of surgery with A SIP OF WATER amLODipine (NORVASC)  famotidine (PEPCID)  levothyroxine (SYNTHROID)  metoprolol succinate (TOPROL-XL)    May take these medicines IF NEEDED: nitroGLYCERIN (NITROSTAT)- If you have to take this medication prior to surgery, please call [phone] and report this to a nurse    One week prior to surgery, STOP taking any Aspirin (unless otherwise instructed by your surgeon) Aleve, Naproxen, Ibuprofen, Motrin, Advil, Goody's, BC's, all herbal medications, fish oil, and non-prescription vitamins.                     Do NOT Smoke (Tobacco/Vaping) for 24 hours prior to your procedure.  If you use a CPAP at night, you may bring your mask/headgear for your overnight stay.   You will be asked to remove any contacts, glasses, piercing's, hearing aid's, dentures/partials prior to surgery. Please bring cases for these items if needed.    Patients discharged the day of surgery will not be allowed to drive home, and someone needs to stay with them for 24 hours.  SURGICAL WAITING ROOM VISITATION Patients may have no more than 2 support people in the waiting area - these visitors may rotate.   Pre-op nurse will coordinate an appropriate time for 1 ADULT support person, who may not rotate, to accompany patient in pre-op.  Children under the age of 37 must have an  adult with them who is not the patient and must remain in the main waiting area with an adult.  If the patient needs to stay at the hospital during part of their recovery, the visitor guidelines for inpatient rooms apply.  Please refer to the [facility] website for the visitor guidelines for any additional information.   If you received a COVID test during your pre-op visit  it is requested that you wear a mask when out in public, stay away from anyone that may not be feeling well and notify your surgeon if you develop symptoms. If you have been in contact with anyone that has tested positive in the last 10 days please notify you surgeon.      Pre-operative CHG Bathing Instructions   You can play a key role in reducing the risk of infection after surgery. Your skin needs to be as free of germs as possible. You can reduce the number of germs on your skin by washing with CHG (chlorhexidine gluconate) soap before surgery. CHG is an antiseptic soap that kills germs and continues to kill germs even after washing.   DO NOT use if you have an allergy to chlorhexidine/CHG or antibacterial soaps. If your skin becomes reddened or irritated, stop using the CHG and notify one of our RNs at [phone].              TAKE A SHOWER THE NIGHT BEFORE SURGERY AND THE DAY OF SURGERY    Please  keep in mind the following:  DO NOT shave, including legs and underarms, 48 hours prior to surgery.   You may shave your face before/day of surgery.  Place clean sheets on your bed the night before surgery Use a clean washcloth (not used since being washed) for each shower. DO NOT sleep with pet's night before surgery.  CHG Shower Instructions:  Wash your face and private area with normal soap. If you choose to wash your hair, wash first with your normal shampoo.  After you use shampoo/soap, rinse your hair and body thoroughly to remove shampoo/soap residue.  Turn the water OFF and apply half the bottle of CHG soap  to a CLEAN washcloth.  Apply CHG soap ONLY FROM YOUR NECK DOWN TO YOUR TOES (washing for 3-5 minutes)  DO NOT use CHG soap on face, private areas, open wounds, or sores.  Pay special attention to the area where your surgery is being performed.  If you are having back surgery, having someone wash your back for you may be helpful. Wait 2 minutes after CHG soap is applied, then you may rinse off the CHG soap.  Pat dry with a clean towel  Put on clean pajamas    Additional instructions for the day of surgery: DO NOT APPLY any lotions, deodorants, cologne, or perfumes.   Do not wear jewelry or makeup Do not wear nail polish, gel polish, artificial nails, or any other type of covering on natural nails (fingers and toes) Do not bring valuables to the hospital. [facility] is not responsible for valuables/personal belongings. Put on clean/comfortable clothes.  Please brush your teeth.  Ask your nurse before applying any prescription medications to the skin.

## 2024-01-02 ENCOUNTER — Ambulatory Visit (HOSPITAL_COMMUNITY)
Admission: RE | Admit: 2024-01-02 | Discharge: 2024-01-02 | Disposition: A | Discharge status: Home / Self Care | Source: Ambulatory Visit | Attending: Thoracic Surgery (Cardiothoracic Vascular Surgery)

## 2024-01-02 ENCOUNTER — Other Ambulatory Visit: Payer: Self-pay

## 2024-01-02 ENCOUNTER — Encounter (HOSPITAL_COMMUNITY): Payer: Self-pay

## 2024-01-02 ENCOUNTER — Ambulatory Visit (HOSPITAL_BASED_OUTPATIENT_CLINIC_OR_DEPARTMENT_OTHER)
Admission: RE | Admit: 2024-01-02 | Discharge: 2024-01-02 | Disposition: A | Discharge status: Home / Self Care | Source: Ambulatory Visit | Attending: Thoracic Surgery (Cardiothoracic Vascular Surgery) | Admitting: Thoracic Surgery (Cardiothoracic Vascular Surgery)

## 2024-01-02 ENCOUNTER — Ambulatory Visit: Payer: PPO | Admitting: Physician Assistant

## 2024-01-02 ENCOUNTER — Encounter (HOSPITAL_COMMUNITY)
Admission: RE | Admit: 2024-01-02 | Discharge: 2024-01-02 | Disposition: A | Discharge status: Home / Self Care | Source: Ambulatory Visit | Attending: Thoracic Surgery (Cardiothoracic Vascular Surgery) | Admitting: Thoracic Surgery (Cardiothoracic Vascular Surgery)

## 2024-01-02 VITALS — BP 156/66 | HR 63 | Temp 97.8°F | Resp 18 | Ht 68.0 in | Wt 193.3 lb

## 2024-01-02 DIAGNOSIS — I2510 Atherosclerotic heart disease of native coronary artery without angina pectoris: Secondary | ICD-10-CM | POA: Insufficient documentation

## 2024-01-02 DIAGNOSIS — Z01818 Encounter for other preprocedural examination: Secondary | ICD-10-CM

## 2024-01-02 HISTORY — DX: Cardiomyopathy, unspecified: I42.9

## 2024-01-02 HISTORY — DX: Atherosclerotic heart disease of native coronary artery without angina pectoris: I25.10

## 2024-01-02 HISTORY — DX: Left bundle-branch block, unspecified: I44.7

## 2024-01-02 HISTORY — DX: Hypothyroidism, unspecified: E03.9

## 2024-01-02 LAB — URINALYSIS, ROUTINE W REFLEX MICROSCOPIC
Bacteria, UA: NONE SEEN
Bilirubin Urine: NEGATIVE
Glucose, UA: NEGATIVE mg/dL
Hgb urine dipstick: NEGATIVE
Ketones, ur: NEGATIVE mg/dL
Leukocytes,Ua: NEGATIVE
Nitrite: NEGATIVE
Protein, ur: 100 mg/dL — AB
Specific Gravity, Urine: 1.015 (ref 1.005–1.030)
pH: 6 (ref 5.0–8.0)

## 2024-01-02 LAB — TYPE AND SCREEN
ABO/RH(D): O POS
Antibody Screen: NEGATIVE

## 2024-01-02 LAB — COMPREHENSIVE METABOLIC PANEL
ALT: 16 U/L (ref 0–44)
AST: 18 U/L (ref 15–41)
Albumin: 3.3 g/dL — ABNORMAL LOW (ref 3.5–5.0)
Alkaline Phosphatase: 55 U/L (ref 38–126)
Anion gap: 6 (ref 5–15)
BUN: 25 mg/dL — ABNORMAL HIGH (ref 8–23)
CO2: 28 mmol/L (ref 22–32)
Calcium: 8.7 mg/dL — ABNORMAL LOW (ref 8.9–10.3)
Chloride: 106 mmol/L (ref 98–111)
Creatinine, Ser: 1.61 mg/dL — ABNORMAL HIGH (ref 0.61–1.24)
GFR, Estimated: 44 mL/min — ABNORMAL LOW (ref >60)
Glucose, Bld: 79 mg/dL (ref 70–99)
Potassium: 4.4 mmol/L (ref 3.5–5.1)
Sodium: 140 mmol/L (ref 135–145)
Total Bilirubin: 0.9 mg/dL (ref 0.0–1.2)
Total Protein: 6.7 g/dL (ref 6.5–8.1)

## 2024-01-02 LAB — CBC
HCT: 45.4 % (ref 39.0–52.0)
Hemoglobin: 14.6 g/dL (ref 13.0–17.0)
MCH: 29.2 pg (ref 26.0–34.0)
MCHC: 32.2 g/dL (ref 30.0–36.0)
MCV: 90.8 fL (ref 80.0–100.0)
Platelets: 231 10*3/uL (ref 150–400)
RBC: 5 MIL/uL (ref 4.22–5.81)
RDW: 13.3 % (ref 11.5–15.5)
WBC: 7.4 10*3/uL (ref 4.0–10.5)
nRBC: 0 % (ref 0.0–0.2)

## 2024-01-02 LAB — HEMOGLOBIN A1C
Hgb A1c MFr Bld: 5.4 % (ref 4.8–5.6)
Mean Plasma Glucose: 108.28 mg/dL

## 2024-01-02 LAB — PROTIME-INR
INR: 1.1 (ref 0.8–1.2)
Prothrombin Time: 14.1 s (ref 11.4–15.2)

## 2024-01-02 LAB — SURGICAL PCR SCREEN
MRSA, PCR: NEGATIVE
Staphylococcus aureus: NEGATIVE

## 2024-01-02 LAB — APTT: aPTT: 37 s — ABNORMAL HIGH (ref 24–36)

## 2024-01-02 NOTE — Progress Notes (Signed)
 PCP - Dr. Johny Blamer Cardiologist - Dr. Cristal Deer End  PPM/ICD - denies   Chest x-ray - 01/02/24 EKG - 12/11/23 Stress Test - 02/10/20 ECHO - 11/09/23 Cardiac Cath - 12/15/23  Sleep Study - denies   DM- denies  Last dose of GLP1 agonist-  n/a   ASA/Blood Thinner Instructions: n/a (pt states he never started taking ASA because he didn't see it on MyChart until recently )    ERAS Protcol - no, NPO   COVID TEST- n/a   Anesthesia review: yes, cardiac hx  Patient denies shortness of breath, fever, cough and chest pain at PAT appointment   All instructions explained to the patient, with a verbal understanding of the material. Patient agrees to go over the instructions while at home for a better understanding.  The opportunity to ask questions was provided.

## 2024-01-03 MED ORDER — NITROGLYCERIN IN D5W 200-5 MCG/ML-% IV SOLN
2.0000 ug/min | INTRAVENOUS | Status: DC
  Filled 2024-01-03: qty 250

## 2024-01-03 MED ORDER — MILRINONE LACTATE IN DEXTROSE 20-5 MG/100ML-% IV SOLN
0.3000 ug/kg/min | INTRAVENOUS | Status: DC
Start: 2024-01-04 — End: 2024-01-05
  Filled 2024-01-03: qty 100

## 2024-01-03 MED ORDER — POTASSIUM CHLORIDE 2 MEQ/ML IV SOLN
80.0000 meq | INTRAVENOUS | Status: DC
  Filled 2024-01-03: qty 40

## 2024-01-03 MED ORDER — CEFAZOLIN SODIUM-DEXTROSE 2-4 GM/100ML-% IV SOLN
2.0000 g | INTRAVENOUS | Status: AC
  Administered 2024-01-04: 2 g via INTRAVENOUS
  Filled 2024-01-03: qty 100

## 2024-01-03 MED ORDER — EPINEPHRINE HCL 5 MG/250ML IV SOLN IN NS
0.0000 ug/min | INTRAVENOUS | Status: AC
  Administered 2024-01-04: 2 ug/min via INTRAVENOUS
  Filled 2024-01-03: qty 250

## 2024-01-03 MED ORDER — TRANEXAMIC ACID 1000 MG/10ML IV SOLN
1.5000 mg/kg/h | INTRAVENOUS | Status: AC
  Administered 2024-01-04: 1.5 mg/kg/h via INTRAVENOUS
  Filled 2024-01-03: qty 25

## 2024-01-03 MED ORDER — DEXMEDETOMIDINE HCL IN NACL 400 MCG/100ML IV SOLN
0.1000 ug/kg/h | INTRAVENOUS | Status: AC
  Administered 2024-01-04: .3 ug/kg/h via INTRAVENOUS
  Filled 2024-01-03: qty 100

## 2024-01-03 MED ORDER — HEPARIN 30,000 UNITS/1000 ML (OHS) CELLSAVER SOLUTION
Status: DC
  Filled 2024-01-03: qty 1000

## 2024-01-03 MED ORDER — NOREPINEPHRINE 4 MG/250ML-% IV SOLN
0.0000 ug/min | INTRAVENOUS | Status: DC
  Filled 2024-01-03: qty 250

## 2024-01-03 MED ORDER — VANCOMYCIN HCL 1250 MG/250ML IV SOLN
1250.0000 mg | INTRAVENOUS | Status: DC
  Filled 2024-01-03: qty 250

## 2024-01-03 MED ORDER — VANCOMYCIN HCL 1.5 G IV SOLR
1500.0000 mg | INTRAVENOUS | Status: AC
  Administered 2024-01-04: 1500 mg via INTRAVENOUS
  Filled 2024-01-03 (×2): qty 30

## 2024-01-03 MED ORDER — TRANEXAMIC ACID (OHS) PUMP PRIME SOLUTION
2.0000 mg/kg | INTRAVENOUS | Status: DC
  Filled 2024-01-03: qty 1.75

## 2024-01-03 MED ORDER — PLASMA-LYTE A IV SOLN
INTRAVENOUS | Status: DC
  Filled 2024-01-03: qty 2.5

## 2024-01-03 MED ORDER — INSULIN REGULAR(HUMAN) IN NACL 100-0.9 UT/100ML-% IV SOLN
INTRAVENOUS | Status: AC
  Administered 2024-01-04: .8 [IU]/h via INTRAVENOUS
  Filled 2024-01-03: qty 100

## 2024-01-03 MED ORDER — TRANEXAMIC ACID (OHS) BOLUS VIA INFUSION
15.0000 mg/kg | INTRAVENOUS | Status: AC
  Administered 2024-01-04: 1315.5 mg via INTRAVENOUS
  Filled 2024-01-03: qty 1316

## 2024-01-03 MED ORDER — MAGNESIUM SULFATE 50 % IJ SOLN
INTRAMUSCULAR | Status: DC
  Filled 2024-01-03: qty 13

## 2024-01-03 MED ORDER — PHENYLEPHRINE HCL-NACL 20-0.9 MG/250ML-% IV SOLN
30.0000 ug/min | INTRAVENOUS | Status: DC
  Filled 2024-01-03: qty 250

## 2024-01-03 NOTE — Anesthesia Preprocedure Evaluation (Signed)
 Anesthesia Evaluation  Patient identified by MRN, date of birth, ID band Patient awake    Reviewed: Allergy & Precautions, H&P , NPO status , Patient's Chart, lab work & pertinent test results  Airway Mallampati: II  TM Distance: >3 FB Neck ROM: Full    Dental no notable dental hx.    Pulmonary neg pulmonary ROS   Pulmonary exam normal breath sounds clear to auscultation       Cardiovascular hypertension, + angina  + CAD  Normal cardiovascular exam Rhythm:Regular Rate:Normal  TTE IMPRESSIONS     1. Left ventricular ejection fraction, by estimation, is 35 to 40%. Left  ventricular ejection fraction by PLAX is 35 %. The left ventricle has  moderately decreased function. The left ventricle demonstrates mild global  hypokinesis with severe inferior  wall hypokinesis. Left ventricular diastolic parameters are consistent  with Grade I diastolic dysfunction (impaired relaxation).   2. Right ventricular systolic function is normal. The right ventricular  size is normal.   3. The mitral valve is normal in structure. Mild to moderate mitral valve  regurgitation. No evidence of mitral stenosis.   4. The aortic valve is normal in structure. Aortic valve regurgitation is  not visualized. No aortic stenosis is present.   5. The inferior vena cava is normal in size with greater than 50%  respiratory variability, suggesting right atrial pressure of 3 mmHg.     Neuro/Psych  Headaches  negative psych ROS   GI/Hepatic Neg liver ROS,GERD  ,,  Endo/Other  Hypothyroidism    Renal/GU CRFRenal disease  negative genitourinary   Musculoskeletal  (+) Arthritis ,    Abdominal   Peds negative pediatric ROS (+)  Hematology negative hematology ROS (+)   Anesthesia Other Findings   Reproductive/Obstetrics negative OB ROS                              Anesthesia Physical Anesthesia Plan  ASA: 4  Anesthesia  Plan: General   Post-op Pain Management:    Induction: Intravenous  PONV Risk Score and Plan: Ondansetron and Treatment may vary due to age or medical condition  Airway Management Planned: Oral ETT  Additional Equipment: Arterial line, CVP, PA Cath, TEE, 3D TEE and Ultrasound Guidance Line Placement  Intra-op Plan:   Post-operative Plan: Post-operative intubation/ventilation  Informed Consent: I have reviewed the patients History and Physical, chart, labs and discussed the procedure including the risks, benefits and alternatives for the proposed anesthesia with the patient or authorized representative who has indicated his/her understanding and acceptance.     Dental advisory given  Plan Discussed with: CRNA  Anesthesia Plan Comments:          Anesthesia Quick Evaluation

## 2024-01-04 ENCOUNTER — Inpatient Hospital Stay (HOSPITAL_COMMUNITY)
Admission: RE | Admit: 2024-01-04 | Discharge: 2024-01-12 | DRG: 235 | Disposition: A | Discharge status: Rehabilitation Facility | Attending: Thoracic Surgery (Cardiothoracic Vascular Surgery) | Admitting: Thoracic Surgery (Cardiothoracic Vascular Surgery)

## 2024-01-04 ENCOUNTER — Encounter (HOSPITAL_COMMUNITY): Payer: Self-pay | Admitting: Thoracic Surgery (Cardiothoracic Vascular Surgery)

## 2024-01-04 ENCOUNTER — Inpatient Hospital Stay (HOSPITAL_COMMUNITY): Payer: Self-pay | Admitting: Physician Assistant

## 2024-01-04 ENCOUNTER — Other Ambulatory Visit: Payer: Self-pay

## 2024-01-04 ENCOUNTER — Inpatient Hospital Stay (HOSPITAL_COMMUNITY)

## 2024-01-04 ENCOUNTER — Inpatient Hospital Stay (HOSPITAL_COMMUNITY): Payer: Self-pay | Admitting: Certified Registered Nurse Anesthetist

## 2024-01-04 ENCOUNTER — Encounter (HOSPITAL_COMMUNITY)
Admission: RE | Disposition: A | Discharge status: Other Acute Inpatient Hospital | Payer: Self-pay | Source: Home / Self Care | Attending: Thoracic Surgery (Cardiothoracic Vascular Surgery)

## 2024-01-04 DIAGNOSIS — Z8249 Family history of ischemic heart disease and other diseases of the circulatory system: Secondary | ICD-10-CM

## 2024-01-04 DIAGNOSIS — Z8041 Family history of malignant neoplasm of ovary: Secondary | ICD-10-CM | POA: Diagnosis not present

## 2024-01-04 DIAGNOSIS — Z79890 Hormone replacement therapy (postmenopausal): Secondary | ICD-10-CM

## 2024-01-04 DIAGNOSIS — G8191 Hemiplegia, unspecified affecting right dominant side: Secondary | ICD-10-CM | POA: Diagnosis not present

## 2024-01-04 DIAGNOSIS — I5020 Unspecified systolic (congestive) heart failure: Secondary | ICD-10-CM | POA: Diagnosis not present

## 2024-01-04 DIAGNOSIS — I6521 Occlusion and stenosis of right carotid artery: Secondary | ICD-10-CM | POA: Diagnosis not present

## 2024-01-04 DIAGNOSIS — R0989 Other specified symptoms and signs involving the circulatory and respiratory systems: Secondary | ICD-10-CM | POA: Diagnosis not present

## 2024-01-04 DIAGNOSIS — Z825 Family history of asthma and other chronic lower respiratory diseases: Secondary | ICD-10-CM | POA: Diagnosis not present

## 2024-01-04 DIAGNOSIS — I110 Hypertensive heart disease with heart failure: Secondary | ICD-10-CM

## 2024-01-04 DIAGNOSIS — Z8546 Personal history of malignant neoplasm of prostate: Secondary | ICD-10-CM | POA: Diagnosis not present

## 2024-01-04 DIAGNOSIS — I5022 Chronic systolic (congestive) heart failure: Secondary | ICD-10-CM | POA: Diagnosis not present

## 2024-01-04 DIAGNOSIS — I429 Cardiomyopathy, unspecified: Secondary | ICD-10-CM | POA: Diagnosis present

## 2024-01-04 DIAGNOSIS — Z8042 Family history of malignant neoplasm of prostate: Secondary | ICD-10-CM

## 2024-01-04 DIAGNOSIS — Z888 Allergy status to other drugs, medicaments and biological substances status: Secondary | ICD-10-CM

## 2024-01-04 DIAGNOSIS — N1832 Chronic kidney disease, stage 3b: Secondary | ICD-10-CM | POA: Diagnosis not present

## 2024-01-04 DIAGNOSIS — I340 Nonrheumatic mitral (valve) insufficiency: Secondary | ICD-10-CM | POA: Diagnosis not present

## 2024-01-04 DIAGNOSIS — D62 Acute posthemorrhagic anemia: Secondary | ICD-10-CM | POA: Diagnosis not present

## 2024-01-04 DIAGNOSIS — I63413 Cerebral infarction due to embolism of bilateral middle cerebral arteries: Secondary | ICD-10-CM | POA: Diagnosis not present

## 2024-01-04 DIAGNOSIS — I749 Embolism and thrombosis of unspecified artery: Secondary | ICD-10-CM | POA: Diagnosis not present

## 2024-01-04 DIAGNOSIS — I25119 Atherosclerotic heart disease of native coronary artery with unspecified angina pectoris: Secondary | ICD-10-CM

## 2024-01-04 DIAGNOSIS — N1831 Chronic kidney disease, stage 3a: Secondary | ICD-10-CM | POA: Diagnosis not present

## 2024-01-04 DIAGNOSIS — Y713 Surgical instruments, materials and cardiovascular devices (including sutures) associated with adverse incidents: Secondary | ICD-10-CM | POA: Diagnosis not present

## 2024-01-04 DIAGNOSIS — Z48812 Encounter for surgical aftercare following surgery on the circulatory system: Secondary | ICD-10-CM | POA: Diagnosis not present

## 2024-01-04 DIAGNOSIS — R1310 Dysphagia, unspecified: Secondary | ICD-10-CM | POA: Diagnosis not present

## 2024-01-04 DIAGNOSIS — I63522 Cerebral infarction due to unspecified occlusion or stenosis of left anterior cerebral artery: Secondary | ICD-10-CM | POA: Diagnosis not present

## 2024-01-04 DIAGNOSIS — I130 Hypertensive heart and chronic kidney disease with heart failure and stage 1 through stage 4 chronic kidney disease, or unspecified chronic kidney disease: Secondary | ICD-10-CM | POA: Diagnosis present

## 2024-01-04 DIAGNOSIS — R0902 Hypoxemia: Secondary | ICD-10-CM | POA: Diagnosis not present

## 2024-01-04 DIAGNOSIS — J9601 Acute respiratory failure with hypoxia: Secondary | ICD-10-CM | POA: Diagnosis not present

## 2024-01-04 DIAGNOSIS — I517 Cardiomegaly: Secondary | ICD-10-CM | POA: Diagnosis not present

## 2024-01-04 DIAGNOSIS — I2510 Atherosclerotic heart disease of native coronary artery without angina pectoris: Secondary | ICD-10-CM | POA: Diagnosis not present

## 2024-01-04 DIAGNOSIS — I4891 Unspecified atrial fibrillation: Secondary | ICD-10-CM | POA: Diagnosis not present

## 2024-01-04 DIAGNOSIS — D6959 Other secondary thrombocytopenia: Secondary | ICD-10-CM | POA: Diagnosis not present

## 2024-01-04 DIAGNOSIS — D72829 Elevated white blood cell count, unspecified: Secondary | ICD-10-CM | POA: Diagnosis not present

## 2024-01-04 DIAGNOSIS — R918 Other nonspecific abnormal finding of lung field: Secondary | ICD-10-CM | POA: Diagnosis not present

## 2024-01-04 DIAGNOSIS — I10 Essential (primary) hypertension: Secondary | ICD-10-CM

## 2024-01-04 DIAGNOSIS — I639 Cerebral infarction, unspecified: Secondary | ICD-10-CM | POA: Diagnosis not present

## 2024-01-04 DIAGNOSIS — Z880 Allergy status to penicillin: Secondary | ICD-10-CM | POA: Diagnosis not present

## 2024-01-04 DIAGNOSIS — R0689 Other abnormalities of breathing: Secondary | ICD-10-CM | POA: Diagnosis not present

## 2024-01-04 DIAGNOSIS — J939 Pneumothorax, unspecified: Secondary | ICD-10-CM | POA: Diagnosis not present

## 2024-01-04 DIAGNOSIS — Y832 Surgical operation with anastomosis, bypass or graft as the cause of abnormal reaction of the patient, or of later complication, without mention of misadventure at the time of the procedure: Secondary | ICD-10-CM | POA: Diagnosis not present

## 2024-01-04 DIAGNOSIS — N179 Acute kidney failure, unspecified: Secondary | ICD-10-CM | POA: Diagnosis not present

## 2024-01-04 DIAGNOSIS — N183 Chronic kidney disease, stage 3 unspecified: Secondary | ICD-10-CM | POA: Diagnosis not present

## 2024-01-04 DIAGNOSIS — I63422 Cerebral infarction due to embolism of left anterior cerebral artery: Secondary | ICD-10-CM | POA: Diagnosis not present

## 2024-01-04 DIAGNOSIS — E785 Hyperlipidemia, unspecified: Secondary | ICD-10-CM | POA: Diagnosis present

## 2024-01-04 DIAGNOSIS — E039 Hypothyroidism, unspecified: Secondary | ICD-10-CM | POA: Diagnosis not present

## 2024-01-04 DIAGNOSIS — R739 Hyperglycemia, unspecified: Secondary | ICD-10-CM | POA: Diagnosis not present

## 2024-01-04 DIAGNOSIS — Z96642 Presence of left artificial hip joint: Secondary | ICD-10-CM | POA: Diagnosis not present

## 2024-01-04 DIAGNOSIS — R531 Weakness: Secondary | ICD-10-CM | POA: Diagnosis not present

## 2024-01-04 DIAGNOSIS — K5900 Constipation, unspecified: Secondary | ICD-10-CM | POA: Diagnosis not present

## 2024-01-04 DIAGNOSIS — Z7982 Long term (current) use of aspirin: Secondary | ICD-10-CM | POA: Diagnosis not present

## 2024-01-04 DIAGNOSIS — M1990 Unspecified osteoarthritis, unspecified site: Secondary | ICD-10-CM | POA: Diagnosis not present

## 2024-01-04 DIAGNOSIS — I447 Left bundle-branch block, unspecified: Secondary | ICD-10-CM | POA: Diagnosis not present

## 2024-01-04 DIAGNOSIS — K219 Gastro-esophageal reflux disease without esophagitis: Secondary | ICD-10-CM | POA: Diagnosis present

## 2024-01-04 DIAGNOSIS — R29898 Other symptoms and signs involving the musculoskeletal system: Secondary | ICD-10-CM | POA: Diagnosis not present

## 2024-01-04 DIAGNOSIS — I97820 Postprocedural cerebrovascular infarction during cardiac surgery: Secondary | ICD-10-CM | POA: Diagnosis not present

## 2024-01-04 DIAGNOSIS — R29712 NIHSS score 12: Secondary | ICD-10-CM | POA: Diagnosis not present

## 2024-01-04 DIAGNOSIS — I69398 Other sequelae of cerebral infarction: Secondary | ICD-10-CM | POA: Diagnosis not present

## 2024-01-04 DIAGNOSIS — I4819 Other persistent atrial fibrillation: Secondary | ICD-10-CM | POA: Diagnosis present

## 2024-01-04 DIAGNOSIS — Z79899 Other long term (current) drug therapy: Secondary | ICD-10-CM

## 2024-01-04 DIAGNOSIS — Z452 Encounter for adjustment and management of vascular access device: Secondary | ICD-10-CM | POA: Diagnosis not present

## 2024-01-04 DIAGNOSIS — J90 Pleural effusion, not elsewhere classified: Secondary | ICD-10-CM | POA: Diagnosis not present

## 2024-01-04 DIAGNOSIS — R29818 Other symptoms and signs involving the nervous system: Secondary | ICD-10-CM | POA: Diagnosis not present

## 2024-01-04 DIAGNOSIS — R600 Localized edema: Secondary | ICD-10-CM | POA: Diagnosis not present

## 2024-01-04 DIAGNOSIS — Z951 Presence of aortocoronary bypass graft: Principal | ICD-10-CM

## 2024-01-04 DIAGNOSIS — J9811 Atelectasis: Secondary | ICD-10-CM | POA: Diagnosis not present

## 2024-01-04 DIAGNOSIS — F5101 Primary insomnia: Secondary | ICD-10-CM | POA: Diagnosis not present

## 2024-01-04 DIAGNOSIS — I6523 Occlusion and stenosis of bilateral carotid arteries: Secondary | ICD-10-CM | POA: Diagnosis not present

## 2024-01-04 HISTORY — PX: INTRAOPERATIVE TRANSESOPHAGEAL ECHOCARDIOGRAM: SHX5062

## 2024-01-04 HISTORY — PX: CORONARY ARTERY BYPASS GRAFT: SHX141

## 2024-01-04 LAB — POCT I-STAT 7, (LYTES, BLD GAS, ICA,H+H)
Acid-base deficit: 4 mmol/L — ABNORMAL HIGH (ref 0.0–2.0)
Acid-base deficit: 4 mmol/L — ABNORMAL HIGH (ref 0.0–2.0)
Acid-base deficit: 2 mmol/L (ref 0.0–2.0)
Acid-base deficit: 5 mmol/L — ABNORMAL HIGH (ref 0.0–2.0)
Acid-base deficit: 4 mmol/L — ABNORMAL HIGH (ref 0.0–2.0)
Acid-base deficit: 9 mmol/L — ABNORMAL HIGH (ref 0.0–2.0)
Acid-base deficit: 3 mmol/L — ABNORMAL HIGH (ref 0.0–2.0)
Acid-base deficit: 8 mmol/L — ABNORMAL HIGH (ref 0.0–2.0)
Bicarbonate: 22.1 mmol/L (ref 20.0–28.0)
Bicarbonate: 21.2 mmol/L (ref 20.0–28.0)
Bicarbonate: 22.8 mmol/L (ref 20.0–28.0)
Bicarbonate: 20.2 mmol/L (ref 20.0–28.0)
Bicarbonate: 21.2 mmol/L (ref 20.0–28.0)
Bicarbonate: 16.7 mmol/L — ABNORMAL LOW (ref 20.0–28.0)
Bicarbonate: 22.1 mmol/L (ref 20.0–28.0)
Bicarbonate: 17.3 mmol/L — ABNORMAL LOW (ref 20.0–28.0)
Calcium, Ion: 1.2 mmol/L (ref 1.15–1.40)
Calcium, Ion: 1 mmol/L — ABNORMAL LOW (ref 1.15–1.40)
Calcium, Ion: 1.06 mmol/L — ABNORMAL LOW (ref 1.15–1.40)
Calcium, Ion: 1.24 mmol/L (ref 1.15–1.40)
Calcium, Ion: 1.22 mmol/L (ref 1.15–1.40)
Calcium, Ion: 1.14 mmol/L — ABNORMAL LOW (ref 1.15–1.40)
Calcium, Ion: 1.16 mmol/L (ref 1.15–1.40)
Calcium, Ion: 1.05 mmol/L — ABNORMAL LOW (ref 1.15–1.40)
HCT: 37 % — ABNORMAL LOW (ref 39.0–52.0)
HCT: 27 % — ABNORMAL LOW (ref 39.0–52.0)
HCT: 25 % — ABNORMAL LOW (ref 39.0–52.0)
HCT: 25 % — ABNORMAL LOW (ref 39.0–52.0)
HCT: 33 % — ABNORMAL LOW (ref 39.0–52.0)
HCT: 33 % — ABNORMAL LOW (ref 39.0–52.0)
HCT: 35 % — ABNORMAL LOW (ref 39.0–52.0)
HCT: 30 % — ABNORMAL LOW (ref 39.0–52.0)
Hemoglobin: 12.6 g/dL — ABNORMAL LOW (ref 13.0–17.0)
Hemoglobin: 9.2 g/dL — ABNORMAL LOW (ref 13.0–17.0)
Hemoglobin: 8.5 g/dL — ABNORMAL LOW (ref 13.0–17.0)
Hemoglobin: 8.5 g/dL — ABNORMAL LOW (ref 13.0–17.0)
Hemoglobin: 11.2 g/dL — ABNORMAL LOW (ref 13.0–17.0)
Hemoglobin: 11.2 g/dL — ABNORMAL LOW (ref 13.0–17.0)
Hemoglobin: 11.9 g/dL — ABNORMAL LOW (ref 13.0–17.0)
Hemoglobin: 10.2 g/dL — ABNORMAL LOW (ref 13.0–17.0)
O2 Saturation: 100 %
O2 Saturation: 100 %
O2 Saturation: 100 %
O2 Saturation: 100 %
O2 Saturation: 92 %
O2 Saturation: 91 %
O2 Saturation: 92 %
O2 Saturation: 92 %
Patient temperature: 36.1
Patient temperature: 36.7
Patient temperature: 37.2
Patient temperature: 37.4
Potassium: 3.8 mmol/L (ref 3.5–5.1)
Potassium: 4.4 mmol/L (ref 3.5–5.1)
Potassium: 5.2 mmol/L — ABNORMAL HIGH (ref 3.5–5.1)
Potassium: 4.4 mmol/L (ref 3.5–5.1)
Potassium: 4.2 mmol/L (ref 3.5–5.1)
Potassium: 4.1 mmol/L (ref 3.5–5.1)
Potassium: 4.3 mmol/L (ref 3.5–5.1)
Potassium: 3.3 mmol/L — ABNORMAL LOW (ref 3.5–5.1)
Sodium: 143 mmol/L (ref 135–145)
Sodium: 141 mmol/L (ref 135–145)
Sodium: 141 mmol/L (ref 135–145)
Sodium: 142 mmol/L (ref 135–145)
Sodium: 142 mmol/L (ref 135–145)
Sodium: 141 mmol/L (ref 135–145)
Sodium: 142 mmol/L (ref 135–145)
Sodium: 143 mmol/L (ref 135–145)
TCO2: 23 mmol/L (ref 22–32)
TCO2: 22 mmol/L (ref 22–32)
TCO2: 24 mmol/L (ref 22–32)
TCO2: 21 mmol/L — ABNORMAL LOW (ref 22–32)
TCO2: 22 mmol/L (ref 22–32)
TCO2: 18 mmol/L — ABNORMAL LOW (ref 22–32)
TCO2: 23 mmol/L (ref 22–32)
TCO2: 18 mmol/L — ABNORMAL LOW (ref 22–32)
pCO2 arterial: 44.6 mmHg (ref 32–48)
pCO2 arterial: 38.8 mmHg (ref 32–48)
pCO2 arterial: 39.3 mmHg (ref 32–48)
pCO2 arterial: 36.2 mmHg (ref 32–48)
pCO2 arterial: 38.3 mmHg (ref 32–48)
pCO2 arterial: 33.2 mmHg (ref 32–48)
pCO2 arterial: 41.4 mmHg (ref 32–48)
pCO2 arterial: 34.8 mmHg (ref 32–48)
pH, Arterial: 7.302 — ABNORMAL LOW (ref 7.35–7.45)
pH, Arterial: 7.345 — ABNORMAL LOW (ref 7.35–7.45)
pH, Arterial: 7.372 (ref 7.35–7.45)
pH, Arterial: 7.355 (ref 7.35–7.45)
pH, Arterial: 7.348 — ABNORMAL LOW (ref 7.35–7.45)
pH, Arterial: 7.308 — ABNORMAL LOW (ref 7.35–7.45)
pH, Arterial: 7.337 — ABNORMAL LOW (ref 7.35–7.45)
pH, Arterial: 7.306 — ABNORMAL LOW (ref 7.35–7.45)
pO2, Arterial: 224 mmHg — ABNORMAL HIGH (ref 83–108)
pO2, Arterial: 365 mmHg — ABNORMAL HIGH (ref 83–108)
pO2, Arterial: 369 mmHg — ABNORMAL HIGH (ref 83–108)
pO2, Arterial: 242 mmHg — ABNORMAL HIGH (ref 83–108)
pO2, Arterial: 64 mmHg — ABNORMAL LOW (ref 83–108)
pO2, Arterial: 65 mmHg — ABNORMAL LOW (ref 83–108)
pO2, Arterial: 70 mmHg — ABNORMAL LOW (ref 83–108)
pO2, Arterial: 71 mmHg — ABNORMAL LOW (ref 83–108)

## 2024-01-04 LAB — CBC
HCT: 33.2 % — ABNORMAL LOW (ref 39.0–52.0)
HCT: 36.7 % — ABNORMAL LOW (ref 39.0–52.0)
Hemoglobin: 11.2 g/dL — ABNORMAL LOW (ref 13.0–17.0)
Hemoglobin: 12.1 g/dL — ABNORMAL LOW (ref 13.0–17.0)
MCH: 30.4 pg (ref 26.0–34.0)
MCH: 29.8 pg (ref 26.0–34.0)
MCHC: 33.7 g/dL (ref 30.0–36.0)
MCHC: 33 g/dL (ref 30.0–36.0)
MCV: 90.2 fL (ref 80.0–100.0)
MCV: 90.4 fL (ref 80.0–100.0)
Platelets: 134 10*3/uL — ABNORMAL LOW (ref 150–400)
Platelets: 171 10*3/uL (ref 150–400)
RBC: 3.68 MIL/uL — ABNORMAL LOW (ref 4.22–5.81)
RBC: 4.06 MIL/uL — ABNORMAL LOW (ref 4.22–5.81)
RDW: 13.2 % (ref 11.5–15.5)
RDW: 13.4 % (ref 11.5–15.5)
WBC: 14 10*3/uL — ABNORMAL HIGH (ref 4.0–10.5)
WBC: 16.8 10*3/uL — ABNORMAL HIGH (ref 4.0–10.5)
nRBC: 0 % (ref 0.0–0.2)
nRBC: 0 % (ref 0.0–0.2)

## 2024-01-04 LAB — POCT I-STAT EG7
Acid-base deficit: 4 mmol/L — ABNORMAL HIGH (ref 0.0–2.0)
Bicarbonate: 21.8 mmol/L (ref 20.0–28.0)
Calcium, Ion: 1.06 mmol/L — ABNORMAL LOW (ref 1.15–1.40)
HCT: 27 % — ABNORMAL LOW (ref 39.0–52.0)
Hemoglobin: 9.2 g/dL — ABNORMAL LOW (ref 13.0–17.0)
O2 Saturation: 87 %
Potassium: 5 mmol/L (ref 3.5–5.1)
Sodium: 142 mmol/L (ref 135–145)
TCO2: 23 mmol/L (ref 22–32)
pCO2, Ven: 40.6 mmHg — ABNORMAL LOW (ref 44–60)
pH, Ven: 7.337 (ref 7.25–7.43)
pO2, Ven: 56 mmHg — ABNORMAL HIGH (ref 32–45)

## 2024-01-04 LAB — APTT: aPTT: 35 s (ref 24–36)

## 2024-01-04 LAB — POCT I-STAT, CHEM 8
BUN: 27 mg/dL — ABNORMAL HIGH (ref 8–23)
BUN: 26 mg/dL — ABNORMAL HIGH (ref 8–23)
BUN: 26 mg/dL — ABNORMAL HIGH (ref 8–23)
BUN: 27 mg/dL — ABNORMAL HIGH (ref 8–23)
Calcium, Ion: 1.16 mmol/L (ref 1.15–1.40)
Calcium, Ion: 1.19 mmol/L (ref 1.15–1.40)
Calcium, Ion: 1.08 mmol/L — ABNORMAL LOW (ref 1.15–1.40)
Calcium, Ion: 1.21 mmol/L (ref 1.15–1.40)
Chloride: 112 mmol/L — ABNORMAL HIGH (ref 98–111)
Chloride: 111 mmol/L (ref 98–111)
Chloride: 109 mmol/L (ref 98–111)
Chloride: 114 mmol/L — ABNORMAL HIGH (ref 98–111)
Creatinine, Ser: 1.4 mg/dL — ABNORMAL HIGH (ref 0.61–1.24)
Creatinine, Ser: 1.5 mg/dL — ABNORMAL HIGH (ref 0.61–1.24)
Creatinine, Ser: 1.5 mg/dL — ABNORMAL HIGH (ref 0.61–1.24)
Creatinine, Ser: 1.3 mg/dL — ABNORMAL HIGH (ref 0.61–1.24)
Glucose, Bld: 90 mg/dL (ref 70–99)
Glucose, Bld: 118 mg/dL — ABNORMAL HIGH (ref 70–99)
Glucose, Bld: 143 mg/dL — ABNORMAL HIGH (ref 70–99)
Glucose, Bld: 127 mg/dL — ABNORMAL HIGH (ref 70–99)
HCT: 38 % — ABNORMAL LOW (ref 39.0–52.0)
HCT: 34 % — ABNORMAL LOW (ref 39.0–52.0)
HCT: 27 % — ABNORMAL LOW (ref 39.0–52.0)
HCT: 26 % — ABNORMAL LOW (ref 39.0–52.0)
Hemoglobin: 12.9 g/dL — ABNORMAL LOW (ref 13.0–17.0)
Hemoglobin: 11.6 g/dL — ABNORMAL LOW (ref 13.0–17.0)
Hemoglobin: 9.2 g/dL — ABNORMAL LOW (ref 13.0–17.0)
Hemoglobin: 8.8 g/dL — ABNORMAL LOW (ref 13.0–17.0)
Potassium: 3.8 mmol/L (ref 3.5–5.1)
Potassium: 3.8 mmol/L (ref 3.5–5.1)
Potassium: 4.7 mmol/L (ref 3.5–5.1)
Potassium: 4.4 mmol/L (ref 3.5–5.1)
Sodium: 143 mmol/L (ref 135–145)
Sodium: 142 mmol/L (ref 135–145)
Sodium: 141 mmol/L (ref 135–145)
Sodium: 142 mmol/L (ref 135–145)
TCO2: 22 mmol/L (ref 22–32)
TCO2: 23 mmol/L (ref 22–32)
TCO2: 24 mmol/L (ref 22–32)
TCO2: 21 mmol/L — ABNORMAL LOW (ref 22–32)

## 2024-01-04 LAB — BASIC METABOLIC PANEL
Anion gap: 13 (ref 5–15)
BUN: 27 mg/dL — ABNORMAL HIGH (ref 8–23)
CO2: 20 mmol/L — ABNORMAL LOW (ref 22–32)
Calcium: 8.1 mg/dL — ABNORMAL LOW (ref 8.9–10.3)
Chloride: 108 mmol/L (ref 98–111)
Creatinine, Ser: 1.73 mg/dL — ABNORMAL HIGH (ref 0.61–1.24)
GFR, Estimated: 40 mL/min — ABNORMAL LOW (ref >60)
Glucose, Bld: 154 mg/dL — ABNORMAL HIGH (ref 70–99)
Potassium: 4.3 mmol/L (ref 3.5–5.1)
Sodium: 141 mmol/L (ref 135–145)

## 2024-01-04 LAB — PROTIME-INR
INR: 1.4 — ABNORMAL HIGH (ref 0.8–1.2)
Prothrombin Time: 17.6 s — ABNORMAL HIGH (ref 11.4–15.2)

## 2024-01-04 LAB — GLUCOSE, CAPILLARY
Glucose-Capillary: 135 mg/dL — ABNORMAL HIGH (ref 70–99)
Glucose-Capillary: 141 mg/dL — ABNORMAL HIGH (ref 70–99)
Glucose-Capillary: 130 mg/dL — ABNORMAL HIGH (ref 70–99)
Glucose-Capillary: 120 mg/dL — ABNORMAL HIGH (ref 70–99)
Glucose-Capillary: 142 mg/dL — ABNORMAL HIGH (ref 70–99)
Glucose-Capillary: 158 mg/dL — ABNORMAL HIGH (ref 70–99)
Glucose-Capillary: 172 mg/dL — ABNORMAL HIGH (ref 70–99)
Glucose-Capillary: 163 mg/dL — ABNORMAL HIGH (ref 70–99)
Glucose-Capillary: 188 mg/dL — ABNORMAL HIGH (ref 70–99)
Glucose-Capillary: 130 mg/dL — ABNORMAL HIGH (ref 70–99)
Glucose-Capillary: 141 mg/dL — ABNORMAL HIGH (ref 70–99)

## 2024-01-04 LAB — PLATELET COUNT: Platelets: 147 10*3/uL — ABNORMAL LOW (ref 150–400)

## 2024-01-04 LAB — HEMOGLOBIN AND HEMATOCRIT, BLOOD
HCT: 26.3 % — ABNORMAL LOW (ref 39.0–52.0)
Hemoglobin: 8.5 g/dL — ABNORMAL LOW (ref 13.0–17.0)

## 2024-01-04 LAB — MAGNESIUM: Magnesium: 3.6 mg/dL — ABNORMAL HIGH (ref 1.7–2.4)

## 2024-01-04 SURGERY — CORONARY ARTERY BYPASS GRAFTING (CABG)
Anesthesia: General | Site: Esophagus

## 2024-01-04 MED ORDER — MIDAZOLAM HCL 2 MG/2ML IJ SOLN
2.0000 mg | INTRAMUSCULAR | Status: DC | PRN
  Administered 2024-01-04: 2 mg via INTRAVENOUS
  Filled 2024-01-04: qty 2

## 2024-01-04 MED ORDER — ROCURONIUM BROMIDE 10 MG/ML (PF) SYRINGE
PREFILLED_SYRINGE | INTRAVENOUS | Status: DC | PRN
  Administered 2024-01-04: 20 mg via INTRAVENOUS
  Administered 2024-01-04: 50 mg via INTRAVENOUS
  Administered 2024-01-04: 100 mg via INTRAVENOUS

## 2024-01-04 MED ORDER — SODIUM CHLORIDE (PF) 0.9 % IJ SOLN
OROMUCOSAL | Status: DC | PRN
  Administered 2024-01-04 (×2): 4 mL via TOPICAL

## 2024-01-04 MED ORDER — PROPOFOL 500 MG/50ML IV EMUL
INTRAVENOUS | Status: DC | PRN
  Administered 2024-01-04: 30 ug/kg/min via INTRAVENOUS

## 2024-01-04 MED ORDER — EPINEPHRINE 1 MG/10ML IJ SOSY
PREFILLED_SYRINGE | INTRAMUSCULAR | Status: DC | PRN
Start: 2024-01-04 — End: 2024-01-04
  Administered 2024-01-04: 4 ug via INTRAVENOUS
  Administered 2024-01-04: 2 ug via INTRAVENOUS

## 2024-01-04 MED ORDER — SODIUM CHLORIDE 0.45 % IV SOLN: INTRAVENOUS | Status: DC | PRN

## 2024-01-04 MED ORDER — POTASSIUM CHLORIDE 10 MEQ/50ML IV SOLN: 10.0000 meq | INTRAVENOUS | Status: AC

## 2024-01-04 MED ORDER — DOBUTAMINE INFUSION FOR EP/ECHO/NUC (1000 MCG/ML)
INTRAVENOUS | Status: DC | PRN
Start: 2024-01-04 — End: 2024-01-04
  Administered 2024-01-04: 2.5 ug/kg/min via INTRAVENOUS

## 2024-01-04 MED ORDER — LACTATED RINGERS IV SOLN: INTRAVENOUS | Status: DC

## 2024-01-04 MED ORDER — MORPHINE SULFATE (PF) 2 MG/ML IV SOLN
1.0000 mg | INTRAVENOUS | Status: DC | PRN
  Administered 2024-01-04: 1 mg via INTRAVENOUS
  Administered 2024-01-04 – 2024-01-05 (×2): 2 mg via INTRAVENOUS
  Filled 2024-01-04 (×3): qty 1

## 2024-01-04 MED ORDER — DEXMEDETOMIDINE HCL IN NACL 200 MCG/50ML IV SOLN
INTRAVENOUS | Status: AC
  Filled 2024-01-04: qty 50

## 2024-01-04 MED ORDER — METOCLOPRAMIDE HCL 5 MG/ML IJ SOLN
10.0000 mg | Freq: Four times a day (QID) | INTRAMUSCULAR | Status: AC
  Administered 2024-01-04 – 2024-01-05 (×6): 10 mg via INTRAVENOUS
  Filled 2024-01-04 (×6): qty 2

## 2024-01-04 MED ORDER — PANTOPRAZOLE SODIUM 40 MG PO TBEC: 40.0000 mg | DELAYED_RELEASE_TABLET | Freq: Every day | ORAL | Status: DC

## 2024-01-04 MED ORDER — DEXMEDETOMIDINE HCL IN NACL 400 MCG/100ML IV SOLN: 0.0000 ug/kg/h | INTRAVENOUS | Status: DC

## 2024-01-04 MED ORDER — PHENYLEPHRINE HCL-NACL 20-0.9 MG/250ML-% IV SOLN
INTRAVENOUS | Status: DC | PRN
  Administered 2024-01-04: 25 ug/min via INTRAVENOUS

## 2024-01-04 MED ORDER — PHENYLEPHRINE 80 MCG/ML (10ML) SYRINGE FOR IV PUSH (FOR BLOOD PRESSURE SUPPORT)
PREFILLED_SYRINGE | INTRAVENOUS | Status: DC | PRN
  Administered 2024-01-04: 160 ug via INTRAVENOUS

## 2024-01-04 MED ORDER — PROTAMINE SULFATE 10 MG/ML IV SOLN
INTRAVENOUS | Status: DC | PRN
  Administered 2024-01-04: 300 mg via INTRAVENOUS

## 2024-01-04 MED ORDER — NOREPINEPHRINE 4 MG/250ML-% IV SOLN: 0.0000 ug/min | INTRAVENOUS | Status: DC

## 2024-01-04 MED ORDER — LACTATED RINGERS IV SOLN: INTRAVENOUS | Status: DC | PRN

## 2024-01-04 MED ORDER — OXYCODONE HCL 5 MG PO TABS
5.0000 mg | ORAL_TABLET | ORAL | Status: DC | PRN
  Administered 2024-01-04 – 2024-01-05 (×3): 5 mg via ORAL
  Filled 2024-01-04 (×3): qty 1

## 2024-01-04 MED ORDER — DEXTROSE 50 % IV SOLN: 0.0000 mL | INTRAVENOUS | Status: DC | PRN

## 2024-01-04 MED ORDER — METOPROLOL TARTRATE 5 MG/5ML IV SOLN
2.5000 mg | INTRAVENOUS | Status: DC | PRN
  Administered 2024-01-05 – 2024-01-06 (×2): 2.5 mg via INTRAVENOUS
  Administered 2024-01-06: 5 mg via INTRAVENOUS
  Administered 2024-01-06: 2.5 mg via INTRAVENOUS
  Administered 2024-01-06 – 2024-01-07 (×2): 5 mg via INTRAVENOUS
  Filled 2024-01-04 (×7): qty 5

## 2024-01-04 MED ORDER — ONDANSETRON HCL 4 MG/2ML IJ SOLN: 4.0000 mg | Freq: Four times a day (QID) | INTRAMUSCULAR | Status: DC | PRN

## 2024-01-04 MED ORDER — CEFAZOLIN SODIUM-DEXTROSE 2-4 GM/100ML-% IV SOLN
2.0000 g | Freq: Three times a day (TID) | INTRAVENOUS | Status: AC
  Administered 2024-01-04 – 2024-01-06 (×6): 2 g via INTRAVENOUS
  Filled 2024-01-04 (×6): qty 100

## 2024-01-04 MED ORDER — ASPIRIN 81 MG PO CHEW
324.0000 mg | CHEWABLE_TABLET | Freq: Once | ORAL | Status: AC
  Administered 2024-01-04: 324 mg via ORAL
  Filled 2024-01-04: qty 4

## 2024-01-04 MED ORDER — PHENYLEPHRINE 80 MCG/ML (10ML) SYRINGE FOR IV PUSH (FOR BLOOD PRESSURE SUPPORT)
PREFILLED_SYRINGE | INTRAVENOUS | Status: AC
  Filled 2024-01-04: qty 10

## 2024-01-04 MED ORDER — DOBUTAMINE-DEXTROSE 4-5 MG/ML-% IV SOLN: 0.0000 ug/kg/min | INTRAVENOUS | Status: DC

## 2024-01-04 MED ORDER — LIDOCAINE HCL (PF) 2 % IJ SOLN
INTRAMUSCULAR | Status: DC | PRN
Start: 2024-01-04 — End: 2024-01-04
  Administered 2024-01-04: 100 mg via INTRADERMAL

## 2024-01-04 MED ORDER — MIDAZOLAM HCL (PF) 5 MG/ML IJ SOLN
INTRAMUSCULAR | Status: DC | PRN
  Administered 2024-01-04 (×2): 1 mg via INTRAVENOUS

## 2024-01-04 MED ORDER — METOPROLOL TARTRATE 12.5 MG HALF TABLET
12.5000 mg | ORAL_TABLET | Freq: Two times a day (BID) | ORAL | Status: DC
  Administered 2024-01-05 – 2024-01-12 (×13): 12.5 mg via ORAL
  Filled 2024-01-04 (×13): qty 1

## 2024-01-04 MED ORDER — HEPARIN SODIUM (PORCINE) 1000 UNIT/ML IJ SOLN
INTRAMUSCULAR | Status: AC
  Filled 2024-01-04: qty 1

## 2024-01-04 MED ORDER — ASPIRIN 81 MG PO CHEW
324.0000 mg | CHEWABLE_TABLET | Freq: Every day | ORAL | Status: DC
Start: 2024-01-05 — End: 2024-01-08
  Administered 2024-01-07: 324 mg
  Filled 2024-01-04: qty 4

## 2024-01-04 MED ORDER — FENTANYL CITRATE (PF) 250 MCG/5ML IJ SOLN
INTRAMUSCULAR | Status: AC
  Filled 2024-01-04: qty 5

## 2024-01-04 MED ORDER — ~~LOC~~ CARDIAC SURGERY, PATIENT & FAMILY EDUCATION
Freq: Once | Status: DC
  Filled 2024-01-04: qty 1

## 2024-01-04 MED ORDER — MAGNESIUM SULFATE 4 GM/100ML IV SOLN
4.0000 g | Freq: Once | INTRAVENOUS | Status: AC
  Administered 2024-01-04: 4 g via INTRAVENOUS
  Filled 2024-01-04: qty 100

## 2024-01-04 MED ORDER — NICARDIPINE HCL IN NACL 20-0.86 MG/200ML-% IV SOLN: 0.0000 mg/h | INTRAVENOUS | Status: DC

## 2024-01-04 MED ORDER — SODIUM CHLORIDE 0.9% FLUSH: 3.0000 mL | Freq: Two times a day (BID) | INTRAVENOUS | Status: DC

## 2024-01-04 MED ORDER — FENTANYL CITRATE (PF) 250 MCG/5ML IJ SOLN
INTRAMUSCULAR | Status: DC | PRN
  Administered 2024-01-04: 50 ug via INTRAVENOUS
  Administered 2024-01-04 (×6): 100 ug via INTRAVENOUS
  Administered 2024-01-04 (×2): 50 ug via INTRAVENOUS

## 2024-01-04 MED ORDER — INSULIN REGULAR(HUMAN) IN NACL 100-0.9 UT/100ML-% IV SOLN: INTRAVENOUS | Status: DC

## 2024-01-04 MED ORDER — TRAMADOL HCL 50 MG PO TABS
50.0000 mg | ORAL_TABLET | ORAL | Status: DC | PRN
  Administered 2024-01-05: 50 mg via ORAL
  Filled 2024-01-04: qty 1

## 2024-01-04 MED ORDER — BISACODYL 10 MG RE SUPP
10.0000 mg | Freq: Every day | RECTAL | Status: DC
  Administered 2024-01-07: 10 mg via RECTAL
  Filled 2024-01-04: qty 1

## 2024-01-04 MED ORDER — SUGAMMADEX SODIUM 200 MG/2ML IV SOLN
INTRAVENOUS | Status: DC | PRN
  Administered 2024-01-04: 200 mg via INTRAVENOUS

## 2024-01-04 MED ORDER — CHLORHEXIDINE GLUCONATE 0.12 % MT SOLN
15.0000 mL | Freq: Once | OROMUCOSAL | Status: AC
  Administered 2024-01-04: 15 mL via OROMUCOSAL
  Filled 2024-01-04: qty 15

## 2024-01-04 MED ORDER — SODIUM CHLORIDE 0.9% FLUSH: 3.0000 mL | INTRAVENOUS | Status: DC | PRN

## 2024-01-04 MED ORDER — FENTANYL CITRATE (PF) 250 MCG/5ML IJ SOLN
INTRAMUSCULAR | Status: AC
Start: 2024-01-04 — End: ?
  Filled 2024-01-04: qty 5

## 2024-01-04 MED ORDER — BISACODYL 5 MG PO TBEC
10.0000 mg | DELAYED_RELEASE_TABLET | Freq: Every day | ORAL | Status: DC
  Administered 2024-01-09 – 2024-01-12 (×2): 10 mg via ORAL
  Filled 2024-01-04 (×4): qty 2

## 2024-01-04 MED ORDER — ALBUMIN HUMAN 5 % IV SOLN: INTRAVENOUS | Status: DC | PRN

## 2024-01-04 MED ORDER — CLEVIDIPINE BUTYRATE 0.5 MG/ML IV EMUL
INTRAVENOUS | Status: DC | PRN
  Administered 2024-01-04: 10 mg/h via INTRAVENOUS
  Administered 2024-01-04: 2 mg/h via INTRAVENOUS

## 2024-01-04 MED ORDER — MIDAZOLAM HCL 2 MG/2ML IJ SOLN
INTRAMUSCULAR | Status: AC
  Filled 2024-01-04: qty 2

## 2024-01-04 MED ORDER — METOPROLOL TARTRATE 25 MG/10 ML ORAL SUSPENSION
12.5000 mg | Freq: Two times a day (BID) | ORAL | Status: DC
  Filled 2024-01-04 (×5): qty 5

## 2024-01-04 MED ORDER — PLASMA-LYTE A IV SOLN
INTRAVENOUS | Status: DC | PRN
  Administered 2024-01-04: 500 mL

## 2024-01-04 MED ORDER — ACETAMINOPHEN 160 MG/5ML PO SOLN
650.0000 mg | Freq: Once | ORAL | Status: AC
  Administered 2024-01-04: 650 mg
  Filled 2024-01-04: qty 20.3

## 2024-01-04 MED ORDER — CHLORHEXIDINE GLUCONATE 0.12 % MT SOLN
15.0000 mL | OROMUCOSAL | Status: AC
  Administered 2024-01-04: 15 mL via OROMUCOSAL
  Filled 2024-01-04: qty 15

## 2024-01-04 MED ORDER — DOCUSATE SODIUM 100 MG PO CAPS
200.0000 mg | ORAL_CAPSULE | Freq: Every day | ORAL | Status: DC
  Administered 2024-01-09 – 2024-01-12 (×3): 200 mg via ORAL
  Filled 2024-01-04 (×6): qty 2

## 2024-01-04 MED ORDER — METHADONE HCL IV SYRINGE 10 MG/ML FOR CABG
0.2000 mg/kg | Freq: Once | INTRAMUSCULAR | Status: AC
  Administered 2024-01-04: 14 mg via INTRAVENOUS
  Filled 2024-01-04: qty 1.4

## 2024-01-04 MED ORDER — METOPROLOL TARTRATE 12.5 MG HALF TABLET: 12.5000 mg | ORAL_TABLET | Freq: Once | ORAL | Status: DC

## 2024-01-04 MED ORDER — CLEVIDIPINE BUTYRATE 0.5 MG/ML IV EMUL
0.0000 mg/h | INTRAVENOUS | Status: DC
  Administered 2024-01-04: 4 mg/h via INTRAVENOUS
  Administered 2024-01-04: 3 mg/h via INTRAVENOUS
  Filled 2024-01-04 (×2): qty 100

## 2024-01-04 MED ORDER — 0.9 % SODIUM CHLORIDE (POUR BTL) OPTIME
TOPICAL | Status: DC | PRN
  Administered 2024-01-04: 5000 mL

## 2024-01-04 MED ORDER — STERILE WATER FOR IRRIGATION IR SOLN
Status: DC | PRN
  Administered 2024-01-04: 2000 mL

## 2024-01-04 MED ORDER — SODIUM CHLORIDE 0.9% FLUSH
3.0000 mL | Freq: Two times a day (BID) | INTRAVENOUS | Status: DC
  Administered 2024-01-04 (×2): 10 mL via INTRAVENOUS

## 2024-01-04 MED ORDER — HEPARIN SODIUM (PORCINE) 1000 UNIT/ML IJ SOLN
INTRAMUSCULAR | Status: DC | PRN
  Administered 2024-01-04: 30000 [IU] via INTRAVENOUS

## 2024-01-04 MED ORDER — CHLORHEXIDINE GLUCONATE 4 % EX SOLN: 30.0000 mL | CUTANEOUS | Status: DC

## 2024-01-04 MED ORDER — LEVOTHYROXINE SODIUM 50 MCG PO TABS
50.0000 ug | ORAL_TABLET | Freq: Every day | ORAL | Status: DC
  Administered 2024-01-05 – 2024-01-12 (×7): 50 ug via ORAL
  Filled 2024-01-04 (×7): qty 1

## 2024-01-04 MED ORDER — ASPIRIN 325 MG PO TBEC
325.0000 mg | DELAYED_RELEASE_TABLET | Freq: Every day | ORAL | Status: DC
  Administered 2024-01-05: 325 mg via ORAL
  Filled 2024-01-04: qty 1

## 2024-01-04 MED ORDER — ACETAMINOPHEN 160 MG/5ML PO SOLN
1000.0000 mg | Freq: Four times a day (QID) | ORAL | Status: AC
  Filled 2024-01-04: qty 40.6

## 2024-01-04 MED ORDER — SODIUM BICARBONATE 8.4 % IV SOLN
100.0000 meq | Freq: Once | INTRAVENOUS | Status: AC
  Administered 2024-01-04: 100 meq via INTRAVENOUS

## 2024-01-04 MED ORDER — CLEVIDIPINE BUTYRATE 0.5 MG/ML IV EMUL
INTRAVENOUS | Status: AC
  Filled 2024-01-04: qty 50

## 2024-01-04 MED ORDER — PROPOFOL 10 MG/ML IV BOLUS
INTRAVENOUS | Status: AC
  Filled 2024-01-04: qty 20

## 2024-01-04 MED ORDER — SODIUM CHLORIDE 0.9 % IV SOLN: INTRAVENOUS | Status: DC | PRN

## 2024-01-04 MED ORDER — SODIUM CHLORIDE 0.9 % IV SOLN: 250.0000 mL | INTRAVENOUS | Status: DC

## 2024-01-04 MED ORDER — ACETAMINOPHEN 500 MG PO TABS
1000.0000 mg | ORAL_TABLET | Freq: Four times a day (QID) | ORAL | Status: AC
  Administered 2024-01-04 – 2024-01-09 (×15): 1000 mg via ORAL
  Filled 2024-01-04 (×14): qty 2

## 2024-01-04 MED ORDER — PANTOPRAZOLE SODIUM 40 MG IV SOLR
40.0000 mg | Freq: Every day | INTRAVENOUS | Status: AC
  Administered 2024-01-04 – 2024-01-05 (×2): 40 mg via INTRAVENOUS
  Filled 2024-01-04 (×2): qty 10

## 2024-01-04 MED ORDER — ROCURONIUM BROMIDE 10 MG/ML (PF) SYRINGE
PREFILLED_SYRINGE | INTRAVENOUS | Status: AC
  Filled 2024-01-04: qty 20

## 2024-01-04 MED ORDER — ALBUMIN HUMAN 5 % IV SOLN: 250.0000 mL | INTRAVENOUS | Status: DC | PRN

## 2024-01-04 MED ORDER — CHLORHEXIDINE GLUCONATE CLOTH 2 % EX PADS
6.0000 | MEDICATED_PAD | Freq: Every day | CUTANEOUS | Status: DC
  Administered 2024-01-04 – 2024-01-11 (×8): 6 via TOPICAL

## 2024-01-04 MED ORDER — VANCOMYCIN HCL IN DEXTROSE 1-5 GM/200ML-% IV SOLN
1000.0000 mg | Freq: Once | INTRAVENOUS | Status: AC
  Administered 2024-01-04: 1000 mg via INTRAVENOUS
  Filled 2024-01-04: qty 200

## 2024-01-04 MED ORDER — PROPOFOL 10 MG/ML IV BOLUS
INTRAVENOUS | Status: DC | PRN
Start: 2024-01-04 — End: 2024-01-04
  Administered 2024-01-04: 40 mg via INTRAVENOUS
  Administered 2024-01-04 (×2): 20 mg via INTRAVENOUS
  Administered 2024-01-04: 40 mg via INTRAVENOUS
  Administered 2024-01-04: 30 mg via INTRAVENOUS

## 2024-01-04 SURGICAL SUPPLY — 65 items
BAG DECANTER FOR FLEXI CONT (MISCELLANEOUS) ×2 IMPLANT
BLADE STERNUM SYSTEM 6 (BLADE) ×2 IMPLANT
BNDG ELASTIC 4INX 5YD STR LF (GAUZE/BANDAGES/DRESSINGS) IMPLANT
BNDG ELASTIC 6INX 5YD STR LF (GAUZE/BANDAGES/DRESSINGS) ×2 IMPLANT
BNDG GAUZE DERMACEA FLUFF 4 (GAUZE/BANDAGES/DRESSINGS) ×2 IMPLANT
CABLE SURGICAL S-101-97-12 (CABLE) ×2 IMPLANT
CANISTER SUCT 3000ML PPV (MISCELLANEOUS) ×2 IMPLANT
CANNULA MC2 2 STG 29/37 NON-V (CANNULA) ×2 IMPLANT
CANNULA NON VENT 20FR 12 (CANNULA) ×2 IMPLANT
CANNULA VESSEL 3MM BLUNT TIP (CANNULA) IMPLANT
CATH ROBINSON RED A/P 18FR (CATHETERS) ×4 IMPLANT
CONN ST 1/2X1/2 BEN (MISCELLANEOUS) ×2 IMPLANT
CONNECTOR BLAKE 2:1 CARIO BLK (MISCELLANEOUS) ×2 IMPLANT
CONTAINER PROTECT SURGISLUSH (MISCELLANEOUS) ×4 IMPLANT
DERMABOND ADVANCED .7 DNX12 (GAUZE/BANDAGES/DRESSINGS) IMPLANT
DRAIN CHANNEL 19F RND (DRAIN) ×6 IMPLANT
DRAIN CONNECTOR BLAKE 1:1 (MISCELLANEOUS) ×2 IMPLANT
DRAPE SRG 135X102X78XABS (DRAPES) ×2 IMPLANT
DRAPE WARM FLUID 44X44 (DRAPES) ×2 IMPLANT
DRSG AQUACEL AG ADV 3.5X10 (GAUZE/BANDAGES/DRESSINGS) ×2 IMPLANT
ELECT BLADE 4.0 EZ CLEAN MEGAD (MISCELLANEOUS) ×2 IMPLANT
ELECT REM PT RETURN 9FT ADLT (ELECTROSURGICAL) ×4 IMPLANT
ELECTRODE BLDE 4.0 EZ CLN MEGD (MISCELLANEOUS) ×2 IMPLANT
ELECTRODE REM PT RTRN 9FT ADLT (ELECTROSURGICAL) ×4 IMPLANT
FELT TEFLON 1X6 (MISCELLANEOUS) ×4 IMPLANT
GAUZE SPONGE 4X4 12PLY STRL (GAUZE/BANDAGES/DRESSINGS) ×4 IMPLANT
GLOVE BIO SURGEON STRL SZ7 (GLOVE) ×4 IMPLANT
GLOVE BIOGEL M STRL SZ7.5 (GLOVE) ×4 IMPLANT
GOWN STRL REUS W/ TWL LRG LVL3 (GOWN DISPOSABLE) ×8 IMPLANT
GOWN STRL REUS W/ TWL XL LVL3 (GOWN DISPOSABLE) ×4 IMPLANT
HEMOSTAT POWDER SURGIFOAM 1G (HEMOSTASIS) ×4 IMPLANT
INSERT SUTURE HOLDER (MISCELLANEOUS) ×2 IMPLANT
KIT BASIN OR (CUSTOM PROCEDURE TRAY) ×2 IMPLANT
KIT SUCTION CATH 14FR (SUCTIONS) ×2 IMPLANT
KIT TURNOVER KIT B (KITS) ×2 IMPLANT
KIT VASOVIEW HEMOPRO 2 VH 4000 (KITS) ×2 IMPLANT
LEAD PACING MYOCARDI (MISCELLANEOUS) ×2 IMPLANT
MARKER GRAFT CORONARY BYPASS (MISCELLANEOUS) ×6 IMPLANT
NS IRRIG 1000ML POUR BTL (IV SOLUTION) ×10 IMPLANT
PACK E OPEN HEART (SUTURE) ×2 IMPLANT
PACK OPEN HEART (CUSTOM PROCEDURE TRAY) ×2 IMPLANT
PAD ARMBOARD POSITIONER FOAM (MISCELLANEOUS) ×4 IMPLANT
PAD ELECT DEFIB RADIOL ZOLL (MISCELLANEOUS) ×2 IMPLANT
PENCIL BUTTON HOLSTER BLD 10FT (ELECTRODE) ×2 IMPLANT
POSITIONER HEAD DONUT 9IN (MISCELLANEOUS) ×2 IMPLANT
PUNCH AORTIC ROTATE 4.0MM (MISCELLANEOUS) ×2 IMPLANT
SET MPS 3-ND DEL (MISCELLANEOUS) IMPLANT
SUPPORT HEART JANKE-BARRON (MISCELLANEOUS) ×2 IMPLANT
SUT BONE WAX W31G (SUTURE) ×2 IMPLANT
SUT ETHIBOND X763 2 0 SH 1 (SUTURE) ×4 IMPLANT
SUT MNCRL AB 3-0 PS2 18 (SUTURE) ×4 IMPLANT
SUT PDS AB 1 CTX 36 (SUTURE) ×4 IMPLANT
SUT PROLENE 4 0 SH DA (SUTURE) ×2 IMPLANT
SUT PROLENE 5 0 C 1 36 (SUTURE) ×6 IMPLANT
SUT PROLENE 7 0 BV1 MDA (SUTURE) ×2 IMPLANT
SUT VIC AB 1 CTX36XBRD ANBCTR (SUTURE) IMPLANT
SUT VIC AB 2-0 CT1 TAPERPNT 27 (SUTURE) IMPLANT
SUT VIC AB 3-0 X1 27 (SUTURE) IMPLANT
SYSTEM SAHARA CHEST DRAIN ATS (WOUND CARE) ×2 IMPLANT
TOWEL GREEN STERILE (TOWEL DISPOSABLE) ×2 IMPLANT
TOWEL GREEN STERILE FF (TOWEL DISPOSABLE) ×2 IMPLANT
TRAY FOLEY SLVR 16FR TEMP STAT (SET/KITS/TRAYS/PACK) ×2 IMPLANT
TUBING LAP HI FLOW INSUFFLATIO (TUBING) ×2 IMPLANT
UNDERPAD 30X36 HEAVY ABSORB (UNDERPADS AND DIAPERS) ×2 IMPLANT
WATER STERILE IRR 1000ML POUR (IV SOLUTION) ×4 IMPLANT

## 2024-01-04 NOTE — Interval H&P Note (Signed)
 History and Physical Interval Note:  01/04/2024 7:37 AM  Tyler David  has presented today for surgery, with the diagnosis of CAD.  The various methods of treatment have been discussed with the patient and family. After consideration of risks, benefits and other options for treatment, the patient has consented to  Procedure(s): CORONARY ARTERY BYPASS GRAFTING (CABG) (N/A) ECHOCARDIOGRAM, TRANSESOPHAGEAL, INTRAOPERATIVE (N/A) as a surgical intervention.  The patient's history has been reviewed, patient examined, no change in status, stable for surgery.  I have reviewed the patient's chart and labs.  Questions were answered to the patient's satisfaction.     Micky Sheller Keane Scrape

## 2024-01-04 NOTE — Transfer of Care (Signed)
 Immediate Anesthesia Transfer of Care Note  Patient: Tyler David  Procedure(s) Performed: CORONARY ARTERY BYPASS GRAFTING TIMES THREE USING LEFT INTERNAL MAMMARY ARTERY AND RIGHT GREAT SAPHENOUS VEIN HARVESTED ENDOSCOPICALLY (Chest) ECHOCARDIOGRAM, TRANSESOPHAGEAL, INTRAOPERATIVE (Esophagus)  Patient Location: ICU  Anesthesia Type:General  Level of Consciousness: sedated and Patient remains intubated per anesthesia plan  Airway & Oxygen Therapy: Patient remains intubated per anesthesia plan and Patient placed on Ventilator (see vital sign flow sheet for setting)  Post-op Assessment: Report given to RN and Post -op Vital signs reviewed and stable  Post vital signs: Reviewed and stable  Last Vitals:  Vitals Value Taken Time  BP 113/42  64 01/04/24  1246  Temp    Pulse 79 01/04/24 1247  Resp 16 01/04/24 1247  SpO2 93 % 01/04/24 1247  Vitals shown include unfiled device data.  Last Pain:  Vitals:   01/04/24 0603  TempSrc:   PainSc: 0-No pain      Patients Stated Pain Goal: 0 (01/04/24 0603)  Complications: No notable events documented.

## 2024-01-04 NOTE — Anesthesia Procedure Notes (Signed)
 Arterial Line Insertion Start/End3/20/2025 7:03 AM, 01/04/2024 7:05 AM Performed by: Martin Nation, MD, Ardell Aaronson Rochele Raring, CRNA, CRNA  Preanesthetic checklist: patient identified, IV checked, site marked, risks and benefits discussed, surgical consent, monitors and equipment checked, pre-op evaluation, timeout performed and anesthesia consent Patient sedated Left, radial was placed Catheter size: 18 G  Attempts: 1 Procedure performed without using ultrasound guided technique. Following insertion, dressing applied and Biopatch. Post procedure assessment: normal and unchanged  Patient tolerated the procedure well with no immediate complications.

## 2024-01-04 NOTE — Progress Notes (Signed)
      [address]       [place] [address]             [phone]      S/p CABG x 3  Just extubated  BP (!) 156/65   Pulse 82   Temp (!) 96.4 F (35.8 C)   Resp 16   Ht 5\' 8"  (1.727 m)   Wt 85.3 kg   SpO2 98%   BMI 28.59 kg/m  CVP 13,  CI 3.7 by Flow track Dobutamine at 2.5 mcg/kg/min   Intake/Output Summary (Last 24 hours) at 01/04/2024 1712 Last data filed at 01/04/2024 1500 Gross per 24 hour  Intake 3142.59 ml  Output 2218 ml  Net 924.59 ml   CT 150 ml since OR  Hct 33, PLT 134K K 4.4  Doing well early postop Just extubated to BIPAP- monitor closely  Viviann Spare C. Dorris Fetch, MD [facility] [phone]

## 2024-01-04 NOTE — Anesthesia Postprocedure Evaluation (Signed)
 Anesthesia Post Note  Patient: Tyler David  Procedure(s) Performed: CORONARY ARTERY BYPASS GRAFTING TIMES THREE USING LEFT INTERNAL MAMMARY ARTERY AND RIGHT GREAT SAPHENOUS VEIN HARVESTED ENDOSCOPICALLY (Chest) ECHOCARDIOGRAM, TRANSESOPHAGEAL, INTRAOPERATIVE (Esophagus)     Patient location during evaluation: SICU Anesthesia Type: General Level of consciousness: sedated Pain management: pain level controlled Vital Signs Assessment: post-procedure vital signs reviewed and stable Respiratory status: patient remains intubated per anesthesia plan Cardiovascular status: stable Postop Assessment: no apparent nausea or vomiting Anesthetic complications: no   No notable events documented.  Last Vitals:  Vitals:   01/04/24 1345 01/04/24 1350  BP:    Pulse: 84 82  Resp: 16 16  Temp: (!) 35.8 C (!) 35.8 C  SpO2: 95% 98%    Last Pain:  Vitals:   01/04/24 1300  TempSrc: Bladder  PainSc: 0-No pain                 Icard Nation

## 2024-01-04 NOTE — Anesthesia Procedure Notes (Signed)
 Central Venous Catheter Insertion Performed by: [place] Nation, MD, anesthesiologist Start/End3/20/2025 7:00 AM, 01/04/2024 7:15 AM Patient location: Pre-op. Preanesthetic checklist: patient identified, IV checked, site marked, risks and benefits discussed, surgical consent, monitors and equipment checked, pre-op evaluation, timeout performed and anesthesia consent Lidocaine 1% used for infiltration and patient sedated Hand hygiene performed  and maximum sterile barriers used  Catheter size: 8.5 Fr MAC introducer Procedure performed using ultrasound guided technique. Ultrasound Notes:anatomy identified, needle tip was noted to be adjacent to the nerve/plexus identified, no ultrasound evidence of intravascular and/or intraneural injection and image(s) printed for medical record Attempts: 1 Following insertion, line sutured and dressing applied. Post procedure assessment: blood return through all ports, free fluid flow and no air  Patient tolerated the procedure well with no immediate complications.

## 2024-01-04 NOTE — Hospital Course (Addendum)
  History of Present Illness:   At time of CT surgical evaluation Tyler David 78 y.o. male presents for surgical evaluation of three-vessel coronary artery disease.  He states that he started developing some shortness of breath and exertional chest and neck pain about 2 years ago while mowing his lawn.  This has progressed.  He denies any rest pain, but states that the symptoms are recreated walking briskly or up a hill.  He recently was diagnosed with congestive heart failure, and placed on diuretics.  He has undergone complete cardiac evaluation including echocardiogram and cardiac catheterization.  He has an ejection fraction of 35% and echocardiogram revealed mild to moderate mitral regurgitation.  Mostly central.  Cardiac catheterization revealed three-vessel disease.  Dr. Cliffton Asters evaluated the patient and all relevant studies and recommended CABG as his best revascularization option.  He was admitted electively for the procedure.  Hospital course: Patient was admitted electively and taken the operating room and underwent CABG x 3.  A LIMA-LAD, SVG-OM and SVG-PD were placed.  He tolerated the procedure well and was taken to the surgical intensive care unit in stable condition.  He was extubated the evening of surgery.  He did require BIPAP which he weaned off quickly.  He initially required IV Cleviprex for HTN.  This was discontinued and he was started on oral antihypertensives.  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  This is limited the ability to diurese early postoperatively.  He has an expected perioperative acute blood loss which is being monitored.  H/H has stabilized over time.  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  Neurology consultation has been obtained.  As he had developed some paroxysmal atrial fibrillation with RVR as well as some pauses.  It was felt that he should be placed on  a heparin drip.  Further neurological studies were obtained.  PT/OT/speech consultations were obtained.  He was placed n.p.o. status until a stroke swallow screen could be done.  CCM has also been consulted to assist with ICU management.

## 2024-01-04 NOTE — Op Note (Signed)
 [address]       [place] [address]             [phone]                                          01/04/2024 Patient:  Tyler David Pre-Op Dx: 3V CAD CHF with EF off 35% HTN HLP    Post-op Dx:  same Procedure: CABG X 3.  LIMA LAD, RSVG PDA, OM   Endoscopic greater saphenous vein harvest on the right   Surgeon and Role:      * Shirin Echeverry, Eliezer Lofts, MD - Primary    Webb Laws , PA-C - assisting An experienced assistant was required given the complexity of this surgery and the standard of surgical care. The assistant was needed for exposure, dissection, suctioning, retraction of delicate tissues and sutures, instrument exchange and for overall help during this procedure.    Anesthesia  general EBL:  Blood Administration: none Xclamp Time:  43 min Pump Time:   Drains: 59 F blake drain: R, L, mediastinal  Wires: V Counts: correct   Indications: 78 year old male with three-vessel coronary artery disease. He also has reduced LV function with an EF of 35%. Echocardiogram reveals mild to moderate mitral valve regurgitation, but this appears mostly central. He also has chronic renal insufficiency with a creatinine of 1.7. On review of his left heart cath, he has good targets in his LAD, and obtuse marginal. The PDA fills with left-to-right collaterals, and is also potentially a good target. We discussed the risks and benefits of surgical revascularization. He is agreed to proceed.   Findings: Good LIMA, small vein.  Good PDA.  Intramyocardial OM.  Intramyocardial LIMA  Operative Technique: All invasive lines were placed in pre-op holding.  After the risks, benefits and alternatives were thoroughly discussed, the patient was brought to the operative theatre.  Anesthesia was induced, and the patient was prepped and draped in normal sterile fashion.  An appropriate surgical pause was performed, and pre-operative antibiotics were dosed  accordingly.  We began with simultaneous incisions along the right leg for harvesting of the greater saphenous vein and the chest for the sternotomy.  In regards to the sternotomy, this was carried down with bovie cautery, and the sternum was divided with a reciprocating saw.  Meticulous hemostasis was obtained.  The left internal thoracic artery was exposed and harvested in in pedicled fashion.  The patient was systemically heparinized, and the artery was divided distally, and placed in a papaverine sponge.    The sternal elevator was removed, and a retractor was placed.  The pericardium was divided in the midline and fashioned into a cradle with pericardial stitches.   After we confirmed an appropriate ACT, the ascending aorta was cannulated in standard fashion.  The right atrial appendage was used for venous cannulation site.  Cardiopulmonary bypass was initiated, and the heart retractor was placed. The cross clamp was applied, and a dose of anterograde cardioplegia was given with good arrest of the heart.  We moved to the posterior wall of the heart, and found a good target on the PDA.  An arteriotomy was made, and the vein graft was anastomosed to it in an end to side fashion.  Next we exposed the lateral wall, and found a good target on the OM.  An end to side anastomosis with the vein graft was then created. Finally, we exposed a good target on the  LAD, and fashioned an end to side anastomosis between it and the LITA.  We began to re-warm, and a re-animation dose of cardioplegia was given.  The heart was de-aired, and the cross clamp was removed.  Meticulous hemostasis was obtained.    A partial occludding clamp was then placed on the ascending aorta, and we created an end to side anastomosis between it and the proximal vein grafts.  Rings were placed on the proximal anastomosis.  Hemostasis was obtained, and we separated from cardiopulmonary bypass without event.  The heparin was reversed with  protamine.  Chest tubes and wires were placed, and the sternum was re-approximated with sternal wires.  The soft tissue and skin were re-approximated wth absorbable suture.    The patient tolerated the procedure without any immediate complications, and was transferred to the ICU in guarded condition.  Tiffay Pinette Keane Scrape

## 2024-01-04 NOTE — Brief Op Note (Signed)
 01/04/2024  11:05 AM  PATIENT:  Tyler David  78 y.o. male  PRE-OPERATIVE DIAGNOSIS:  CORONARY ARTERY DISEASE  POST-OPERATIVE DIAGNOSIS:  CORONARY ARTERY DISEASE  PROCEDURE:  Procedure(s): CORONARY ARTERY BYPASS GRAFTING TIMES THREE USING LEFT INTERNAL MAMMARY ARTERY AND RIGHT GREAT SAPHENOUS VEIN HARVESTED ENDOSCOPICALLY (N/A) ECHOCARDIOGRAM, TRANSESOPHAGEAL, INTRAOPERATIVE (N/A) Vein harvest time: Vein prep time:  SURGEON:  Surgeons and Role:    * Lightfoot, Eliezer Lofts, MD - Primary  PHYSICIAN ASSISTANT: Syan Cullimore PA-C  ASSISTANTS: KAYLA HAYES RNFA   ANESTHESIA:   general  EBL:  980 ml  BLOOD ADMINISTERED:none  DRAINS:  3 CHEST TUBES    LOCAL MEDICATIONS USED:  NONE  SPECIMEN:  No Specimen  DISPOSITION OF SPECIMEN:  N/A  COUNTS:  YES  TOURNIQUET:  * No tourniquets in log *  DICTATION: .Dragon Dictation  PLAN OF CARE: Admit to inpatient   PATIENT DISPOSITION:  ICU - intubated and hemodynamically stable.   Delay start of Pharmacological VTE agent (>24hrs) due to surgical blood loss or risk of bleeding: yes  COMPLICATIONS: NO KNOWN

## 2024-01-04 NOTE — Anesthesia Procedure Notes (Signed)
 Procedure Name: Intubation Date/Time: 01/04/2024 8:07 AM  Performed by: Waynard Edwards, CRNAPre-anesthesia Checklist: Patient identified, Emergency Drugs available, Suction available and Patient being monitored Patient Re-evaluated:Patient Re-evaluated prior to induction Oxygen Delivery Method: Circle system utilized Preoxygenation: Pre-oxygenation with 100% oxygen Induction Type: IV induction Ventilation: Mask ventilation without difficulty and Oral airway inserted - appropriate to patient size Laryngoscope Size: Glidescope and 4 Grade View: Grade I Tube type: Oral Tube size: 7.5 mm Number of attempts: 2 Airway Equipment and Method: Oral airway, Rigid stylet and Video-laryngoscopy Placement Confirmation: ETT inserted through vocal cords under direct vision, positive ETCO2 and breath sounds checked- equal and bilateral Secured at: 24 cm Tube secured with: Tape Dental Injury: Teeth and Oropharynx as per pre-operative assessment  Comments: DL x 1 with Mil 2.  Grade 3 view.  Easy BMV with oral airway.  DL x 2 with glidescope.  EBBS and VSS.

## 2024-01-04 NOTE — Procedures (Signed)
 Extubation Procedure Note  Patient Details:   Name: Tyler David DOB: [birth year] MRN: [id number]   Airway Documentation:    Vent end date: 01/04/24 Vent end time: 1710   Evaluation  O2 sats: stable throughout Complications: No apparent complications Patient did tolerate procedure well. Bilateral Breath Sounds: Clear   Yes  Pt extubated per rapid weaning protocol. NIF -30 VC 1L with great effort. Positive cuff leak noted, no stridor heard. Per MD placed on BiPAP post extubation. No complications noted.   Vicente Masson 01/04/2024, 5:24 PM

## 2024-01-04 NOTE — Consult Note (Signed)
 NAME:  Tyler David, MRN:  [id number], DOB:  1945-11-05, LOS: 0 ADMISSION DATE:  01/04/2024, CONSULTATION DATE:  01/04/2024 REFERRING MD: Dr. Cliffton Asters, CHIEF COMPLAINT: Status post CABG  History of Present Illness:  78 year old male with CKD stage IIIb, hypertension, hyperlipidemia, prior history of prostate cancer who initially presented with chest pain, shortness of breath and x-ray chest was suggestive of pulmonary edema.  He underwent echocardiogram which showed EF 35-40%.  Underwent cardiac cath and was diagnosed with multivessel obstructive coronary artery disease.  Patient underwent CABG x 3 today, remained on dobutamine, intubated and was transferred to ICU.  PCCM was consulted for help evaluation medical management  Pertinent  Medical History   Past Medical History:  Diagnosis Date   Arthritis    Cancer (HCC)    prostate cancer    Cardiomyopathy (HCC)    Chronic kidney disease, stage 3 (HCC)    Coronary artery disease    ED (erectile dysfunction)    GERD (gastroesophageal reflux disease)    Headache    hx of migraine-2015    Hyperlipidemia    Hypertension    Hypothyroidism    LBBB (left bundle branch block)    Migraine    Nocturia    Overactive bladder    Pneumonia    hx of at age 40     Significant Hospital Events: Including procedures, antibiotic start and stop dates in addition to other pertinent events     Interim History / Subjective:  As above  Objective   Blood pressure (!) 156/65, pulse 66, temperature 97.7 F (36.5 C), temperature source Oral, resp. rate 11, height 5\' 8"  (1.727 m), weight 85.3 kg, SpO2 98%.        Intake/Output Summary (Last 24 hours) at 01/04/2024 1128 Last data filed at 01/04/2024 1124 Gross per 24 hour  Intake 1700 ml  Output 345 ml  Net 1355 ml   Filed Weights   01/04/24 0549  Weight: 85.3 kg    Examination: General: Crtitically ill-appearing elderly male, orally intubated HEENT: [place]/AT, eyes anicteric.  ETT and OGT in  place Neuro: Sedated, not following commands.  Eyes are closed.  Pupils 3 mm bilateral reactive to light Chest: Central sternotomy incision looks clean and dry, coarse breath sounds, no wheezes or rhonchi.  Mediastinal and chest tube in place Heart: Regular rate and rhythm, no murmurs or gallops Abdomen: Soft, nondistended, bowel sounds present Skin: No rash  Labs and images reviewed  Resolved Hospital Problem list   NA  Assessment & Plan:  Coronary artery disease s/p CABG x 3 Continue aspirin Chest tube management TCTS Continue to titrate Precedex with RASS goal 0/-1 Continue pain control with tramadol, oxycodone and morphine Closely monitor chest tube output  Acute respiratory insufficiency, postop Continue on protective ventilation VAP prevention bundle in place Rapid weaning protocol ordered is in place  Chronic HFrEF Monitor intake and output EF 35 to 40% Continue dobutamine Advanced heart failure is consulting GDMT as tolerated  Hypertension Holding antihypertensive for now  CKD stage IIIb Serum creatinine is at baseline around 1.3-1.5, monitor intake and output Avoid nephrotoxic agent  Expected perioperative blood loss anemia Thrombocytopenia due to CPB Monitor H/H and PLT counts   Best Practice (right click and "Reselect all SmartList Selections" daily)   Diet/type: NPO DVT prophylaxis: SCD GI prophylaxis: PPI Lines: Central line, Arterial Line, and yes and it is still needed Foley:  Yes, and it is still needed Code Status:  full code Last date of  multidisciplinary goals of care discussion [Per primary team]   Labs   CBC: Recent Labs  Lab 01/02/24 1330 01/04/24 0816 01/04/24 1023 01/04/24 1031 01/04/24 1047 01/04/24 1050 01/04/24 1111  WBC 7.4  --   --   --   --   --   --   HGB 14.6   < > 9.2* 9.2* 9.2* 8.5* 8.5*  HCT 45.4   < > 27.0* 27.0* 27.0* 26.3* 25.0*  MCV 90.8  --   --   --   --   --   --   PLT 231  --   --   --   --  147*  --     < > = values in this interval not displayed.    Basic Metabolic Panel: Recent Labs  Lab 01/02/24 1330 01/04/24 0816 01/04/24 0820 01/04/24 0946 01/04/24 1023 01/04/24 1031 01/04/24 1047 01/04/24 1111  NA 140   < > 143 142 141 142 141 141  K 4.4   < > 3.8 3.8 4.4 5.0 4.7 5.2*  CL 106  --  112* 111  --   --  109  --   CO2 28  --   --   --   --   --   --   --   GLUCOSE 79  --  90 118*  --   --  143*  --   BUN 25*  --  27* 26*  --   --  26*  --   CREATININE 1.61*  --  1.40* 1.50*  --   --  1.50*  --   CALCIUM 8.7*  --   --   --   --   --   --   --    < > = values in this interval not displayed.   GFR: Estimated Creatinine Clearance: 43.2 mL/min (A) (by C-G formula based on SCr of 1.5 mg/dL (H)). Recent Labs  Lab 01/02/24 1330  WBC 7.4    Liver Function Tests: Recent Labs  Lab 01/02/24 1330  AST 18  ALT 16  ALKPHOS 55  BILITOT 0.9  PROT 6.7  ALBUMIN 3.3*   No results for input(s): "LIPASE", "AMYLASE" in the last 168 hours. No results for input(s): "AMMONIA" in the last 168 hours.  ABG    Component Value Date/Time   PHART 7.372 01/04/2024 1111   PCO2ART 39.3 01/04/2024 1111   PO2ART 369 (H) 01/04/2024 1111   HCO3 22.8 01/04/2024 1111   TCO2 24 01/04/2024 1111   ACIDBASEDEF 2.0 01/04/2024 1111   O2SAT 100 01/04/2024 1111     Coagulation Profile: Recent Labs  Lab 01/02/24 1330  INR 1.1    Cardiac Enzymes: No results for input(s): "CKTOTAL", "CKMB", "CKMBINDEX", "TROPONINI" in the last 168 hours.  HbA1C: Hgb A1c MFr Bld  Date/Time Value Ref Range Status  01/02/2024 01:30 PM 5.4 4.8 - 5.6 % Final    Comment:    (NOTE) Pre diabetes:          5.7%-6.4%  Diabetes:              >6.4%  Glycemic control for   <7.0% adults with diabetes     CBG: No results for input(s): "GLUCAP" in the last 168 hours.  Review of Systems:   Unable to obtain as patient is intubated and sedated  Past Medical History:  He,  has a past medical history of Arthritis,  Cancer (HCC), Cardiomyopathy (HCC), Chronic kidney disease, stage 3 (HCC),  Coronary artery disease, ED (erectile dysfunction), GERD (gastroesophageal reflux disease), Headache, Hyperlipidemia, Hypertension, Hypothyroidism, LBBB (left bundle branch block), Migraine, Nocturia, Overactive bladder, and Pneumonia.   Surgical History:   Past Surgical History:  Procedure Laterality Date   CARPAL TUNNEL RELEASE Bilateral    PROSTATE SURGERY     RIGHT/LEFT HEART CATH AND CORONARY ANGIOGRAPHY Bilateral 12/15/2023   Procedure: RIGHT/LEFT HEART CATH AND CORONARY ANGIOGRAPHY;  Surgeon: Yvonne Kendall, MD;  Location: [facility] INVASIVE CV LAB;  Service: Cardiovascular;  Laterality: Bilateral;   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   Procedure: LEFT TOTAL HIP ARTHROPLASTY ANTERIOR APPROACH;  Surgeon: Durene Romans, MD;  Location: [facility] ORS;  Service: Orthopedics;  Laterality: Left;     Social History:   reports that he has never smoked. He has never used smokeless tobacco. He reports that he does not currently use alcohol. He reports that he does not use drugs.   Family History:  His family history includes Atrial fibrillation in his brother; COPD in his father; Heart failure in his father; Ovarian cancer in his mother; Prostate cancer in his brother.   Allergies Allergies  Allergen Reactions   Atorvastatin     myalgias  Other Reaction(s): myalgias   Pravachol [Pravastatin]     Muscle aches   Simvastatin    Statins Other (See Comments)    Muscle pain   Penicillins Rash    Has patient had a PCN reaction causing immediate rash, facial/tongue/throat swelling, SOB or lightheadedness with hypotension: No Has patient had a PCN reaction causing severe rash involving mucus membranes or skin necrosis: No Has patient had a PCN reaction that required hospitalization No Has patient had a PCN reaction occurring within the last 10 years: No If all of the above answers are "NO", then may proceed with Cephalosporin  use.      Home Medications  Prior to Admission medications   Medication Sig Start Date End Date Taking? Authorizing Provider  amLODipine (NORVASC) 5 MG tablet Take 1 tablet (5 mg total) by mouth daily. 12/11/23  Yes Dunn, Raymon Mutton, PA-C  Cholecalciferol (VITAMIN D-3) 25 MCG (1000 UT) CAPS Take 1,000 Units by mouth daily.   Yes [provider]  famotidine (PEPCID) 20 MG tablet Take 20 mg by mouth daily.   Yes [provider]  furosemide (LASIX) 20 MG tablet Take 1 tablet (20 mg total) by mouth daily as needed (swelling or weight gain (2 pounds in 24 hours or 5 pounds in 1 week)). 12/15/23  Yes End, Cristal Deer, MD  levothyroxine (SYNTHROID) 50 MCG tablet Take 50 mcg by mouth daily before breakfast.   Yes [provider]  MAGNESIUM PO Take 420 mg by mouth daily.   Yes [provider]  metoprolol succinate (TOPROL-XL) 50 MG 24 hr tablet Take 50 mg by mouth at bedtime. Take with or immediately following a meal.   Yes [provider]  Multiple Vitamins-Minerals (ICAPS AREDS 2 PO) Take 1 capsule by mouth 2 (two) times daily.   Yes [provider]  nitroGLYCERIN (NITROSTAT) 0.4 MG SL tablet Place 1 tablet (0.4 mg total) under the tongue every 5 (five) minutes as needed for chest pain. 12/15/23 12/14/24 Yes End, Cristal Deer, MD  Omega-3 Fatty Acids (FISH OIL) 1000 MG CAPS Take 1,000 mg by mouth daily.   Yes [provider]  sacubitril-valsartan (ENTRESTO) 49-51 MG Take 1 tablet by mouth 2 (two) times daily. 12/11/23  Yes Dunn, Raymon Mutton, PA-C  Turmeric 500 MG CAPS Take 500 mg by  mouth 2 (two) times daily.   Yes [provider]     Critical care time:      The patient is critically ill due to coronary artery disease status post CABG, chronic HFrEF/acute respiratory insufficiency postprocedure requiring titration of ventilator. Critical care was necessary to treat or prevent imminent or life-threatening deterioration.  Critical care was  time spent personally by me on the following activities: development of treatment plan with patient and/or surrogate as well as nursing, discussions with consultants, evaluation of patient's response to treatment, examination of patient, obtaining history from patient or surrogate, ordering and performing treatments and interventions, ordering and review of laboratory studies, ordering and review of radiographic studies, pulse oximetry, re-evaluation of patient's condition and participation in multidisciplinary rounds.   During this encounter critical care time was devoted to patient care services described in this note for 36 minutes.   Cheri Fowler, MD [facility] See Amion for pager If no response to pager, please call [phone] until 7pm After 7pm, Please call E-link [phone]

## 2024-01-04 NOTE — Progress Notes (Signed)
 Pt found off BiPAP and resting comfortably. No distress noted. BiPAP on standby in room.

## 2024-01-05 ENCOUNTER — Inpatient Hospital Stay (HOSPITAL_COMMUNITY)

## 2024-01-05 ENCOUNTER — Encounter (HOSPITAL_COMMUNITY): Payer: Self-pay | Admitting: Thoracic Surgery (Cardiothoracic Vascular Surgery)

## 2024-01-05 DIAGNOSIS — N179 Acute kidney failure, unspecified: Secondary | ICD-10-CM | POA: Diagnosis not present

## 2024-01-05 DIAGNOSIS — Z951 Presence of aortocoronary bypass graft: Secondary | ICD-10-CM | POA: Diagnosis not present

## 2024-01-05 DIAGNOSIS — I639 Cerebral infarction, unspecified: Secondary | ICD-10-CM

## 2024-01-05 DIAGNOSIS — I2510 Atherosclerotic heart disease of native coronary artery without angina pectoris: Secondary | ICD-10-CM | POA: Diagnosis not present

## 2024-01-05 LAB — BASIC METABOLIC PANEL
Anion gap: 5 (ref 5–15)
Anion gap: 9 (ref 5–15)
BUN: 27 mg/dL — ABNORMAL HIGH (ref 8–23)
BUN: 33 mg/dL — ABNORMAL HIGH (ref 8–23)
CO2: 22 mmol/L (ref 22–32)
CO2: 22 mmol/L (ref 22–32)
Calcium: 7.3 mg/dL — ABNORMAL LOW (ref 8.9–10.3)
Calcium: 8 mg/dL — ABNORMAL LOW (ref 8.9–10.3)
Chloride: 110 mmol/L (ref 98–111)
Chloride: 106 mmol/L (ref 98–111)
Creatinine, Ser: 1.97 mg/dL — ABNORMAL HIGH (ref 0.61–1.24)
Creatinine, Ser: 2.38 mg/dL — ABNORMAL HIGH (ref 0.61–1.24)
GFR, Estimated: 34 mL/min — ABNORMAL LOW (ref >60)
GFR, Estimated: 27 mL/min — ABNORMAL LOW (ref >60)
Glucose, Bld: 129 mg/dL — ABNORMAL HIGH (ref 70–99)
Glucose, Bld: 129 mg/dL — ABNORMAL HIGH (ref 70–99)
Potassium: 3.9 mmol/L (ref 3.5–5.1)
Potassium: 4 mmol/L (ref 3.5–5.1)
Sodium: 137 mmol/L (ref 135–145)
Sodium: 137 mmol/L (ref 135–145)

## 2024-01-05 LAB — CBC
HCT: 33.6 % — ABNORMAL LOW (ref 39.0–52.0)
HCT: 34.6 % — ABNORMAL LOW (ref 39.0–52.0)
Hemoglobin: 10.9 g/dL — ABNORMAL LOW (ref 13.0–17.0)
Hemoglobin: 11.4 g/dL — ABNORMAL LOW (ref 13.0–17.0)
MCH: 29.1 pg (ref 26.0–34.0)
MCH: 29.9 pg (ref 26.0–34.0)
MCHC: 32.4 g/dL (ref 30.0–36.0)
MCHC: 32.9 g/dL (ref 30.0–36.0)
MCV: 89.6 fL (ref 80.0–100.0)
MCV: 90.8 fL (ref 80.0–100.0)
Platelets: 168 10*3/uL (ref 150–400)
Platelets: 185 10*3/uL (ref 150–400)
RBC: 3.75 MIL/uL — ABNORMAL LOW (ref 4.22–5.81)
RBC: 3.81 MIL/uL — ABNORMAL LOW (ref 4.22–5.81)
RDW: 13.7 % (ref 11.5–15.5)
RDW: 13.9 % (ref 11.5–15.5)
WBC: 13.6 10*3/uL — ABNORMAL HIGH (ref 4.0–10.5)
WBC: 14.5 10*3/uL — ABNORMAL HIGH (ref 4.0–10.5)
nRBC: 0 % (ref 0.0–0.2)
nRBC: 0 % (ref 0.0–0.2)

## 2024-01-05 LAB — ECHO INTRAOPERATIVE TEE
AR max vel: 2.25 cm2
AV Area VTI: 2.14 cm2
AV Area mean vel: 2.15 cm2
AV Mean grad: 5 mmHg
AV Peak grad: 7.8 mmHg
Ao pk vel: 1.4 m/s
Area-P 1/2: 4.06 cm2
Calc EF: 34.6 %
Height: 68 in
P 1/2 time: 514 ms
Single Plane A2C EF: 29.1 %
Single Plane A4C EF: 40.2 %
Weight: 3008 [oz_av]

## 2024-01-05 LAB — GLUCOSE, CAPILLARY
Glucose-Capillary: 107 mg/dL — ABNORMAL HIGH (ref 70–99)
Glucose-Capillary: 111 mg/dL — ABNORMAL HIGH (ref 70–99)
Glucose-Capillary: 126 mg/dL — ABNORMAL HIGH (ref 70–99)
Glucose-Capillary: 134 mg/dL — ABNORMAL HIGH (ref 70–99)
Glucose-Capillary: 134 mg/dL — ABNORMAL HIGH (ref 70–99)
Glucose-Capillary: 143 mg/dL — ABNORMAL HIGH (ref 70–99)
Glucose-Capillary: 91 mg/dL (ref 70–99)

## 2024-01-05 LAB — COOXEMETRY PANEL
Carboxyhemoglobin: 1.8 % — ABNORMAL HIGH (ref 0.5–1.5)
Methemoglobin: 0.7 % (ref 0.0–1.5)
O2 Saturation: 75.7 %
Total hemoglobin: 11.6 g/dL — ABNORMAL LOW (ref 12.0–16.0)

## 2024-01-05 LAB — MAGNESIUM
Magnesium: 2.9 mg/dL — ABNORMAL HIGH (ref 1.7–2.4)
Magnesium: 2.7 mg/dL — ABNORMAL HIGH (ref 1.7–2.4)

## 2024-01-05 MED ORDER — AMIODARONE HCL IN DEXTROSE 360-4.14 MG/200ML-% IV SOLN
INTRAVENOUS | Status: AC
  Administered 2024-01-05: 150 mg via INTRAVENOUS
  Filled 2024-01-05: qty 400

## 2024-01-05 MED ORDER — SODIUM CHLORIDE 0.9 % IV SOLN: INTRAVENOUS | Status: AC | PRN

## 2024-01-05 MED ORDER — ENOXAPARIN SODIUM 30 MG/0.3ML IJ SOSY
30.0000 mg | PREFILLED_SYRINGE | Freq: Every day | INTRAMUSCULAR | Status: DC
  Administered 2024-01-05: 30 mg via SUBCUTANEOUS
  Filled 2024-01-05: qty 0.3

## 2024-01-05 MED ORDER — AMIODARONE HCL IN DEXTROSE 360-4.14 MG/200ML-% IV SOLN
60.0000 mg/h | INTRAVENOUS | Status: DC
  Administered 2024-01-05: 60 mg/h via INTRAVENOUS

## 2024-01-05 MED ORDER — NALOXONE HCL 0.4 MG/ML IJ SOLN
0.4000 mg | INTRAMUSCULAR | Status: DC | PRN
  Administered 2024-01-05: 0.4 mg via INTRAVENOUS

## 2024-01-05 MED ORDER — AMLODIPINE BESYLATE 5 MG PO TABS
5.0000 mg | ORAL_TABLET | Freq: Every day | ORAL | Status: DC
  Administered 2024-01-05 – 2024-01-10 (×5): 5 mg via ORAL
  Filled 2024-01-05 (×5): qty 1

## 2024-01-05 MED ORDER — INSULIN ASPART 100 UNIT/ML IJ SOLN: 0.0000 [IU] | Freq: Three times a day (TID) | INTRAMUSCULAR | Status: DC

## 2024-01-05 MED ORDER — AMIODARONE LOAD VIA INFUSION
150.0000 mg | Freq: Once | INTRAVENOUS | Status: AC
  Filled 2024-01-05: qty 83.34

## 2024-01-05 MED ORDER — NALOXONE HCL 0.4 MG/ML IJ SOLN
INTRAMUSCULAR | Status: AC
  Administered 2024-01-05: 0.4 mg via INTRAVENOUS
  Filled 2024-01-05: qty 1

## 2024-01-05 MED ORDER — AMIODARONE HCL IN DEXTROSE 360-4.14 MG/200ML-% IV SOLN
30.0000 mg/h | INTRAVENOUS | Status: DC
Start: 2024-01-06 — End: 2024-01-06

## 2024-01-05 MED ORDER — INSULIN ASPART 100 UNIT/ML IJ SOLN
0.0000 [IU] | INTRAMUSCULAR | Status: DC
  Administered 2024-01-05: 2 [IU] via SUBCUTANEOUS
  Administered 2024-01-07: 4 [IU] via SUBCUTANEOUS
  Administered 2024-01-07 – 2024-01-10 (×5): 2 [IU] via SUBCUTANEOUS

## 2024-01-05 MED ORDER — ORAL CARE MOUTH RINSE: 15.0000 mL | OROMUCOSAL | Status: DC | PRN

## 2024-01-05 MED ORDER — FUROSEMIDE 40 MG PO TABS
40.0000 mg | ORAL_TABLET | Freq: Every day | ORAL | Status: DC
  Administered 2024-01-05: 40 mg via ORAL
  Filled 2024-01-05: qty 1

## 2024-01-05 MED FILL — Heparin Sodium (Porcine) Inj 1000 Unit/ML: Qty: 1000 | Status: AC

## 2024-01-05 MED FILL — Potassium Chloride Inj 2 mEq/ML: INTRAVENOUS | Qty: 40 | Status: AC

## 2024-01-05 MED FILL — Lidocaine HCl Local Preservative Free (PF) Inj 2%: INTRAMUSCULAR | Qty: 14 | Status: AC

## 2024-01-05 NOTE — Discharge Instructions (Signed)

## 2024-01-05 NOTE — Progress Notes (Signed)
 At 2140 Patient was given 5mg  oxycodone with little improvement in pain. RASS remained 0 after the oxycodone.   Patient was given 5mg  oxycodone and 50mg  tramadol at 0415 for pain 10/10 and for preparation to stand up and get into the chair.   At 0530 patient because increasingly sedated and was unable to be stood for a standing weight. Patient was stimulated but unable to arousal enough to stand. Patient was placed on BiPAP to better ventilate and placed into the chair position in the bed.

## 2024-01-05 NOTE — Progress Notes (Addendum)
 NAME:  Tyler David, MRN:  [id number], DOB:  12-23-1945, LOS: 1 ADMISSION DATE:  01/04/2024, CONSULTATION DATE:  01/04/2024 REFERRING MD: Dr. Cliffton Asters, CHIEF COMPLAINT: Status post CABG  History of Present Illness:  78 year old male with CKD stage IIIb, hypertension, hyperlipidemia, prior history of prostate cancer who initially presented with chest pain, shortness of breath and x-ray chest was suggestive of pulmonary edema.  He underwent echocardiogram which showed EF 35-40%.  Underwent cardiac cath and was diagnosed with multivessel obstructive coronary artery disease.  Patient underwent CABG x 3 today, remained on dobutamine, intubated and was transferred to ICU.  PCCM was consulted for help evaluation medical management  Pertinent  Medical History   Past Medical History:  Diagnosis Date   Arthritis    Cancer (HCC)    prostate cancer    Cardiomyopathy (HCC)    Chronic kidney disease, stage 3 (HCC)    Coronary artery disease    ED (erectile dysfunction)    GERD (gastroesophageal reflux disease)    Headache    hx of migraine-2015    Hyperlipidemia    Hypertension    Hypothyroidism    LBBB (left bundle branch block)    Migraine    Nocturia    Overactive bladder    Pneumonia    hx of at age 76     Significant Hospital Events: Including procedures, antibiotic start and stop dates in addition to other pertinent events     Interim History / Subjective:  Patient was successfully extubated yesterday Remained on BiPAP overnight after getting pain medications Remain afebrile No overnight issues On dobutamine 2.5  Objective   Blood pressure (!) 116/54, pulse 77, temperature 98.6 F (37 C), resp. rate 10, height 5\' 8"  (1.727 m), weight 90.4 kg, SpO2 97%. CVP:  [2 mmHg-31 mmHg] 4 mmHg CO:  [5.4 L/min-11.9 L/min] 8.8 L/min CI:  [2.7 L/min/m2-6 L/min/m2] 4.4 L/min/m2  Vent Mode: PCV;BIPAP FiO2 (%):  [40 %-50 %] 40 % Set Rate:  [4 bmp-16 bmp] 12 bmp Vt Set:  [540 mL] 540  mL PEEP:  [5 cmH20-8 cmH20] 5 cmH20 Pressure Support:  [10 cmH20] 10 cmH20 Plateau Pressure:  [17 cmH20] 17 cmH20   Intake/Output Summary (Last 24 hours) at 01/05/2024 0756 Last data filed at 01/05/2024 3664 Gross per 24 hour  Intake 4664.69 ml  Output 2963 ml  Net 1701.69 ml   Filed Weights   01/04/24 0549 01/05/24 0600  Weight: 85.3 kg 90.4 kg    Examination: General: Elderly male, lying on the bed HEENT: [place]/AT, eyes anicteric.  moist mucus membranes.  On BiPAP Neuro: Lethargic, opens eyes with vocal stimuli, following simple commands Chest: Central sternotomy incision looks clean and dry, coarse breath sounds, no wheezes or rhonchi.  Mediastinal and chest tube in place Heart: Regular rate and rhythm, no murmurs or gallops Abdomen: Soft, nontender, nondistended, bowel sounds present Skin: No rash  Labs and images reviewed  Resolved Hospital Problem list   NA  Assessment & Plan:  Coronary artery disease s/p CABG x 3 Continue aspirin Chest tube management TCTS Chest tube output is 383 since coming back from the OR Continue pain control with tramadol, oxycodone and morphine Closely monitor chest tube output  Acute respiratory insufficiency, postop Patient was successfully extubated Required BiPAP after pain medications last night, will transition to nasal cannula oxygen Encourage incentive spirometry and flutter valve  Chronic HFrEF Monitor intake and output EF 35 to 40% [place] is 75% Stop Dobutamine GDMT as tolerated  Hypertension  Patient was hypertensive, required clevidipine infusion Currently off Will be started on oral antihypertensive meds  AKI on CKD stage IIIb Serum creatinine is at baseline around 1.3-1.5 Monitor intake and output Serum creatinine trended up to 2 Avoid nephrotoxic agent   Expected perioperative blood loss anemia Monitor H/H   Best Practice (right click and "Reselect all SmartList Selections" daily)   Diet/type: Advance as  tolerated DVT prophylaxis: SCD GI prophylaxis: PPI Lines: Central line, Arterial Line, and yes and it is still needed Foley:  Yes, and it is still needed Code Status:  full code Last date of multidisciplinary goals of care discussion [Per primary team]   Labs   CBC: Recent Labs  Lab 01/02/24 1330 01/04/24 0816 01/04/24 1050 01/04/24 1111 01/04/24 1244 01/04/24 1247 01/04/24 1659 01/04/24 1814 01/04/24 1830 01/04/24 2032 01/05/24 0515  WBC 7.4  --   --   --  14.0*  --   --   --  16.8*  --  13.6*  HGB 14.6   < > 8.5*   < > 11.2*   < > 11.2* 11.9* 12.1* 10.2* 10.9*  HCT 45.4   < > 26.3*   < > 33.2*   < > 33.0* 35.0* 36.7* 30.0* 33.6*  MCV 90.8  --   --   --  90.2  --   --   --  90.4  --  89.6  PLT 231  --  147*  --  134*  --   --   --  171  --  168   < > = values in this interval not displayed.    Basic Metabolic Panel: Recent Labs  Lab 01/02/24 1330 01/04/24 0816 01/04/24 0946 01/04/24 1023 01/04/24 1047 01/04/24 1111 01/04/24 1150 01/04/24 1247 01/04/24 1659 01/04/24 1814 01/04/24 1830 01/04/24 2032 01/05/24 0515  NA 140   < > 142   < > 141   < > 142   < > 141 142 141 143 137  K 4.4   < > 3.8   < > 4.7   < > 4.4   < > 4.1 4.3 4.3 3.3* 3.9  CL 106   < > 111  --  109  --  114*  --   --   --  108  --  110  CO2 28  --   --   --   --   --   --   --   --   --  20*  --  22  GLUCOSE 79   < > 118*  --  143*  --  127*  --   --   --  154*  --  129*  BUN 25*   < > 26*  --  26*  --  27*  --   --   --  27*  --  27*  CREATININE 1.61*   < > 1.50*  --  1.50*  --  1.30*  --   --   --  1.73*  --  1.97*  CALCIUM 8.7*  --   --   --   --   --   --   --   --   --  8.1*  --  7.3*  MG  --   --   --   --   --   --   --   --   --   --  3.6*  --  2.9*   < > = values  in this interval not displayed.   GFR: Estimated Creatinine Clearance: 33.7 mL/min (A) (by C-G formula based on SCr of 1.97 mg/dL (H)). Recent Labs  Lab 01/02/24 1330 01/04/24 1244 01/04/24 1830 01/05/24 0515  WBC  7.4 14.0* 16.8* 13.6*    Liver Function Tests: Recent Labs  Lab 01/02/24 1330  AST 18  ALT 16  ALKPHOS 55  BILITOT 0.9  PROT 6.7  ALBUMIN 3.3*   No results for input(s): "LIPASE", "AMYLASE" in the last 168 hours. No results for input(s): "AMMONIA" in the last 168 hours.  ABG    Component Value Date/Time   PHART 7.306 (L) 01/04/2024 2032   PCO2ART 34.8 01/04/2024 2032   PO2ART 71 (L) 01/04/2024 2032   HCO3 17.3 (L) 01/04/2024 2032   TCO2 18 (L) 01/04/2024 2032   ACIDBASEDEF 8.0 (H) 01/04/2024 2032   O2SAT 92 01/04/2024 2032     Coagulation Profile: Recent Labs  Lab 01/02/24 1330 01/04/24 1244  INR 1.1 1.4*    Cardiac Enzymes: No results for input(s): "CKTOTAL", "CKMB", "CKMBINDEX", "TROPONINI" in the last 168 hours.  HbA1C: Hgb A1c MFr Bld  Date/Time Value Ref Range Status  01/02/2024 01:30 PM 5.4 4.8 - 5.6 % Final    Comment:    (NOTE) Pre diabetes:          5.7%-6.4%  Diabetes:              >6.4%  Glycemic control for   <7.0% adults with diabetes     CBG: Recent Labs  Lab 01/05/24 0037 01/05/24 0233 01/05/24 0418 01/05/24 0531 01/05/24 0738  GLUCAP 126* 143* 134* 134* 91     Cheri Fowler, MD [facility] See Amion for pager If no response to pager, please call [phone] until 7pm After 7pm, Please call E-link [phone]

## 2024-01-05 NOTE — TOC Initial Note (Signed)
 Transition of Care [facility]) - Initial/Assessment Note    Patient Details  Name: Tyler David MRN: [id number] Date of Birth: 1946/04/27  Transition of Care [facility]) CM/SW Contact:    Gala Lewandowsky, RN Phone Number: 01/05/2024, 11:17 AM  Clinical Narrative:  Patient was discussed in progression rounds-POD-1 CABG. PTA patient was independent from home with spouse. Spouse was at the bedside during the visit. Patient has DME rolling walker in the home. [facility] is following the patient with the TCTS office protocol for [facility] needs. Case Manager will continue to follow for transition of care needs as the patient progresses.                 Expected Discharge Plan: Home w Home Health Services Barriers to Discharge: Continued Medical Work up   Patient Goals and CMS Choice Patient states their goals for this hospitalization and ongoing recovery are:: Plan to return home  Expected Discharge Plan and Services   Discharge Planning Services: CM Consult Post Acute Care Choice: Home Health Living arrangements for the past 2 months: Single Family Home  Prior Living Arrangements/Services Living arrangements for the past 2 months: Single Family Home Lives with:: Spouse Patient language and need for interpreter reviewed:: Yes Do you feel safe going back to the place where you live?: Yes      Need for Family Participation in Patient Care: Yes (Comment) Care giver support system in place?: Yes (comment)   Criminal Activity/Legal Involvement Pertinent to Current Situation/Hospitalization: No - Comment as needed  Activities of Daily Living   ADL Screening (condition at time of admission) Independently performs ADLs?: Yes (appropriate for developmental age) Is the patient deaf or have difficulty hearing?: Yes Does the patient have difficulty seeing, even when wearing glasses/contacts?: Yes Does the patient have difficulty concentrating, remembering, or making decisions?: No  Permission  Sought/Granted Permission sought to share information with : Family Supports, Case Manager        Emotional Assessment Appearance:: Appears stated age Attitude/Demeanor/Rapport: Engaged Affect (typically observed): Appropriate Orientation: : Oriented to Self, Oriented to Place, Oriented to  Time, Oriented to Situation Alcohol / Substance Use: Not Applicable Psych Involvement: No (comment)  Admission diagnosis:  Coronary artery disease involving native coronary artery of native heart without angina pectoris [I25.10] S/P CABG x 3 [Z95.1] Patient Active Problem List   Diagnosis Date Noted   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   Angina pectoris (HCC) 12/15/2023   HFrEF (heart failure with reduced ejection fraction) (HCC) 12/15/2023   Palpitations 12/16/2019   PVC's (premature ventricular contractions) 12/16/2019   Left bundle branch block 12/16/2019   Essential hypertension 12/16/2019   Osteoarthritis of knee 06/07/2018   S/P left THA, 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   PCP:  Noberto Retort, MD Pharmacy:   [facility] [address], [place] - [address] [place] [address] Phone: [phone] Fax: [phone]     Social Drivers of Health (SDOH) Social History: SDOH Screenings   Food Insecurity: No Food Insecurity (01/04/2024)  Housing: Low Risk  (01/04/2024)  Transportation Needs: No Transportation Needs (01/04/2024)  Utilities: Not At Risk (01/04/2024)  Social Connections: Socially Integrated (01/04/2024)  Tobacco Use: Low Risk  (01/04/2024)   SDOH Interventions:     Readmission Risk Interventions     No data to display

## 2024-01-05 NOTE — Progress Notes (Signed)
 [address]       [place] [address]             [phone]                 1 Day Post-Op Procedure(s) (LRB): CORONARY ARTERY BYPASS GRAFTING TIMES THREE USING LEFT INTERNAL MAMMARY ARTERY AND RIGHT GREAT SAPHENOUS VEIN HARVESTED ENDOSCOPICALLY (N/A) ECHOCARDIOGRAM, TRANSESOPHAGEAL, INTRAOPERATIVE (N/A)   Events: No events  _______________________________________________________________ Vitals: BP 133/62   Pulse 82   Temp 98.4 F (36.9 C)   Resp 11   Ht 5\' 8"  (1.727 m)   Wt 90.4 kg   SpO2 95%   BMI 30.30 kg/m  Filed Weights   01/04/24 0549 01/05/24 0600  Weight: 85.3 kg 90.4 kg     - Neuro: arousable  - Cardiovascular: sinus  Drips: cleviprex 1.  Dobu 2.5 CVP:  [2 mmHg-31 mmHg] 6 mmHg CO:  [5.4 L/min-11.9 L/min] 10.2 L/min CI:  [2.7 L/min/m2-6 L/min/m2] 5.1 L/min/m2  - Pulm: EWOB Vent Mode: PCV;BIPAP FiO2 (%):  [36 %-50 %] 36 % Set Rate:  [4 bmp-16 bmp] 12 bmp Vt Set:  [540 mL] 540 mL PEEP:  [5 cmH20-8 cmH20] 5 cmH20 Pressure Support:  [10 cmH20] 10 cmH20 Plateau Pressure:  [17 cmH20] 17 cmH20  ABG    Component Value Date/Time   PHART 7.306 (L) 01/04/2024 2032   PCO2ART 34.8 01/04/2024 2032   PO2ART 71 (L) 01/04/2024 2032   HCO3 17.3 (L) 01/04/2024 2032   TCO2 18 (L) 01/04/2024 2032   ACIDBASEDEF 8.0 (H) 01/04/2024 2032   O2SAT 75.7 01/05/2024 0830    - Abd: ND - Extremity: warm  .Intake/Output      03/20 0701 03/21 0700 03/21 0701 03/22 0700   P.O. 400    I.V. (mL/kg) 1955.4 (21.6) 65 (0.7)   Blood 450    IV Piggyback 1859.3    Total Intake(mL/kg) 4664.7 (51.6) 65 (0.7)   Urine (mL/kg/hr) 1600 (0.7) 100 (0.3)   Blood 980    Chest Tube 383 70   Total Output 2963 170   Net +1701.7 -105           _______________________________________________________________ Labs:    Latest Ref Rng & Units 01/05/2024    5:15 AM 01/04/2024    8:32 PM 01/04/2024    6:30 PM  CBC  WBC 4.0 - 10.5 K/uL 13.6   16.8   Hemoglobin  13.0 - 17.0 g/dL 44.0  10.2  72.5   Hematocrit 39.0 - 52.0 % 33.6  30.0  36.7   Platelets 150 - 400 K/uL 168   171       Latest Ref Rng & Units 01/05/2024    5:15 AM 01/04/2024    8:32 PM 01/04/2024    6:30 PM  CMP  Glucose 70 - 99 mg/dL 366   440   BUN 8 - 23 mg/dL 27   27   Creatinine 3.47 - 1.24 mg/dL 4.25   9.56   Sodium 387 - 145 mmol/L 137  143  141   Potassium 3.5 - 5.1 mmol/L 3.9  3.3  4.3   Chloride 98 - 111 mmol/L 110   108   CO2 22 - 32 mmol/L 22   20   Calcium 8.9 - 10.3 mg/dL 7.3   8.1     CXR: PV congestion  _______________________________________________________________  Assessment and Plan: POD 1 s/p CABG  Neuro: Pain control.  Decrease pain meds CV: checking co-ox.  If good will d/c dob.  On A/S.  BB once off dob.  Starting home meds Pulm: IS, ambulation Renal: creat up.  Will follow.   GI: on diet Heme: stable ID: afebrile Endo: SSI Dispo: continue ICU care   Corliss Skains 01/05/2024 10:54 AM

## 2024-01-05 NOTE — Discharge Summary (Signed)
 [address]       [place] [address]             [phone]    Physician Discharge Summary  Patient ID: Tyler David MRN: [id number] DOB/AGE: [birth year] 79 y.o.  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 male presents for surgical evaluation of three-vessel coronary artery disease.  He states that he started developing some shortness of breath and exertional chest and neck pain about 2 years ago while mowing his lawn.  This has progressed.  He denies any rest pain, but states that the symptoms are recreated walking briskly or up a hill.  He recently was diagnosed with congestive heart failure, and placed on diuretics.  He has undergone complete cardiac evaluation including echocardiogram and cardiac catheterization.  He has an ejection fraction of 35% and echocardiogram revealed mild to moderate mitral regurgitation.  Mostly central.  Cardiac catheterization revealed three-vessel disease.  Dr. Cliffton Asters  evaluated the patient and all relevant studies and recommended CABG as his best revascularization option.  He was admitted electively for the procedure.  Hospital course: Patient was admitted electively and taken the operating room and underwent CABG x 3.  A LIMA-LAD, SVG-OM and SVG-PD were placed.  He tolerated the procedure well and was taken to the surgical intensive care unit in stable condition.  He was extubated the evening of surgery.  He did require BIPAP which he weaned off quickly.  He initially required IV Cleviprex for HTN.  This was discontinued and he was started on oral antihypertensives.  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  This has limited the ability to diurese early postoperatively.  He has an expected perioperative acute blood loss which is being monitored.  H/H has stabilized over time.  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  Neurology consultation has been obtained.  As he had developed some paroxysmal atrial fibrillation with RVR as well as some pauses.  It was felt that he should be placed on a heparin drip.  Further neurological studies were obtained.  PT/OT/speech consultations were obtained.  He was placed n.p.o. status until a stroke swallow screen could be done.  CCM has also been consulted to assist with ICU management.  Speech evaluation was performed and Modified Barium Swallow was obtained and patient was felt safe to place on an oral diet with regular medications.  He was started on Eliquis for  further stroke prophylaxis.  He was felt to be a candidate for CIR and consult was placed.  They felt patient was an appropriate candidate.  Insurance authorization was initiated and they approved placement.  He remains in NSR.  His surgical incisions are healing without evidence of infection.  He is stable for discharge to CIR today.  Consults: pulmonary/intensive  care  Significant Diagnostic Studies: angiography:  Conclusions: Severe three-vessel coronary artery disease, as detailed below. Normal left and right heart filling pressures. Normal Fick cardiac output/index.   Recommendations: Outpatient cardiac surgery consultation for CABG. Change furosemide to as needed for weight gain/edema. Continue escalation of GDMT as tolerated. Consider adding PCSK9 inhibitor at follow-up, given intolerance to multiple statins.   Yvonne Kendall, MD [facility]  Treatments: surgery:   01/04/2024 Patient:  Tyler David Pre-Op Dx: 3V CAD CHF with EF off 35% HTN HLP    Post-op Dx:  same Procedure: CABG X 3.  LIMA LAD, RSVG PDA, OM   Endoscopic greater saphenous vein harvest on the right     Surgeon and Role:      * Lightfoot, Eliezer Lofts, MD - Primary    Webb Laws , PA-C - assisting An experienced assistant was required given the complexity of this surgery and the standard of surgical care. The assistant was needed for exposure, dissection, suctioning, retraction of delicate tissues and sutures, instrument exchange and for overall help during this procedure.     Discharge Exam: Blood pressure (!) 149/66, pulse 67, temperature 98.4 F (36.9 C), temperature source Oral, resp. rate 17, height 5\' 8"  (1.727 m), weight 87.3 kg, SpO2 93%.  General appearance: alert, cooperative, and no distress Heart: regular rate and rhythm Lungs: clear to auscultation bilaterally Abdomen: soft, non-tender; bowel sounds normal; no masses,  no organomegaly Extremities: edema + Wound: clean and dryu   Discharge Medications:  The patient has been discharged on:   1.Beta Blocker:  Yes [ X  ]                              No   [   ]                              If No, reason:  2.Ace Inhibitor/ARB: Yes [   ]                                     No  [  x  ]                                     If No, reason: titration of BB  3.Statin:   Yes [   ]                   No  [ X  ]                  If No, reason: Intolerance  4.Marlowe KaysValentino Hue  [  X ]                  No   [   ]  If No, reason:  Patient had ACS upon admission: No   Plavix/P2Y12 inhibitor: Yes [ X  ]                                      No  [   ]     Discharge Instructions     AMB Referral to [facility]   Complete by: As directed    Reason For Referral: Lipids   Amb Referral to Cardiac Rehabilitation   Complete by: As directed    Diagnosis: CABG   CABG X ___: 3   After initial evaluation and assessments completed: Virtual Based Care may be provided alone or in conjunction with Phase 2 Cardiac Rehab based on patient barriers.: Yes   Intensive Cardiac Rehabilitation (ICR) [facility] location only OR Traditional Cardiac Rehabilitation (TCR) *If criteria for ICR are not met will enroll in TCR [facility] only): Yes      Allergies as of 01/12/2024       Reactions   Atorvastatin    myalgias Other Reaction(s): myalgias   Pravachol [pravastatin]    Muscle aches   Simvastatin    Statins Other (See Comments)   Muscle pain   Penicillins Rash   Has patient had a PCN reaction causing immediate rash, facial/tongue/throat swelling, SOB or lightheadedness with hypotension: No Has patient had a PCN reaction causing severe rash involving mucus membranes or skin necrosis: No Has patient had a PCN reaction that required hospitalization No Has patient had a PCN reaction occurring within the last 10 years: No If all of the above answers are "NO", then may proceed with Cephalosporin use.        Medication List     STOP taking these medications    sacubitril-valsartan 49-51 MG Commonly known as: Entresto       TAKE these medications    amiodarone 200 MG tablet Commonly known as: PACERONE Take 2 tablets (400 mg total) by mouth 2 (two) times daily. X 5 days, then decrease to 200 mg BID x 7 days, then 200 mg daily   amLODipine 10 MG tablet Commonly known as:  NORVASC Take 1 tablet (10 mg total) by mouth daily. What changed:  medication strength how much to take   apixaban 5 MG Tabs tablet Commonly known as: ELIQUIS Take 1 tablet (5 mg total) by mouth 2 (two) times daily.   aspirin EC 81 MG tablet Take 1 tablet (81 mg total) by mouth daily. Swallow whole.   ezetimibe 10 MG tablet Commonly known as: ZETIA Take 1 tablet (10 mg total) by mouth daily.   famotidine 20 MG tablet Commonly known as: PEPCID Take 20 mg by mouth daily.   Fish Oil 1000 MG Caps Take 1,000 mg by mouth daily.   furosemide 40 MG tablet Commonly known as: Lasix Take 1 tablet (40 mg total) by mouth daily. What changed:  medication strength how much to take when to take this reasons to take this   ICAPS AREDS 2 PO Take 1 capsule by mouth 2 (two) times daily.   levothyroxine 50 MCG tablet Commonly known as: SYNTHROID Take 50 mcg by mouth daily before breakfast.   MAGNESIUM PO Take 420 mg by mouth daily.   metoprolol succinate 25 MG 24 hr tablet Commonly known as: Toprol XL Take 1 tablet (25 mg total) by mouth daily. What changed:  medication strength how much to take when  to take this additional instructions   nitroGLYCERIN 0.4 MG SL tablet Commonly known as: Nitrostat Place 1 tablet (0.4 mg total) under the tongue every 5 (five) minutes as needed for chest pain.   oxyCODONE 5 MG immediate release tablet Commonly known as: Oxy IR/ROXICODONE Take 1 tablet (5 mg total) by mouth every 4 (four) hours as needed for severe pain (pain score 7-10).   Turmeric 500 MG Caps Take 500 mg by mouth 2 (two) times daily.   Vitamin D-3 25 MCG (1000 UT) Caps Take 1,000 Units by mouth daily.        Follow-up Information     Corliss Skains, MD Follow up on 01/19/2024.   Specialty: Cardiothoracic Surgery Why: This is a virtual appointment.  You do not need to come to the office, Dr. Cliffton Asters will call you Contact information: [address]  [address] [place] [address] [phone]         Sondra Barges, PA-C Follow up on 01/31/2024.   Specialties: Cardiology, Radiology Why: Appointment is at 8:25 Contact information: [address] [place] [address] [phone]                 Signed:  Lowella Dandy, PA-C  01/12/2024, 8:00 AM

## 2024-01-05 NOTE — Addendum Note (Signed)
 Addendum  created 01/05/24 1200 by Bryant Nation, MD   Clinical Note Signed, Intraprocedure Blocks edited

## 2024-01-06 ENCOUNTER — Inpatient Hospital Stay (HOSPITAL_COMMUNITY)

## 2024-01-06 DIAGNOSIS — N179 Acute kidney failure, unspecified: Secondary | ICD-10-CM | POA: Diagnosis not present

## 2024-01-06 DIAGNOSIS — I2510 Atherosclerotic heart disease of native coronary artery without angina pectoris: Secondary | ICD-10-CM | POA: Diagnosis not present

## 2024-01-06 DIAGNOSIS — Z951 Presence of aortocoronary bypass graft: Secondary | ICD-10-CM | POA: Diagnosis not present

## 2024-01-06 DIAGNOSIS — I639 Cerebral infarction, unspecified: Secondary | ICD-10-CM | POA: Diagnosis not present

## 2024-01-06 LAB — BASIC METABOLIC PANEL
Anion gap: 8 (ref 5–15)
BUN: 35 mg/dL — ABNORMAL HIGH (ref 8–23)
CO2: 23 mmol/L (ref 22–32)
Calcium: 7.7 mg/dL — ABNORMAL LOW (ref 8.9–10.3)
Chloride: 109 mmol/L (ref 98–111)
Creatinine, Ser: 2.41 mg/dL — ABNORMAL HIGH (ref 0.61–1.24)
GFR, Estimated: 27 mL/min — ABNORMAL LOW (ref >60)
Glucose, Bld: 109 mg/dL — ABNORMAL HIGH (ref 70–99)
Potassium: 3.6 mmol/L (ref 3.5–5.1)
Sodium: 140 mmol/L (ref 135–145)

## 2024-01-06 LAB — GLUCOSE, CAPILLARY
Glucose-Capillary: 105 mg/dL — ABNORMAL HIGH (ref 70–99)
Glucose-Capillary: 106 mg/dL — ABNORMAL HIGH (ref 70–99)
Glucose-Capillary: 111 mg/dL — ABNORMAL HIGH (ref 70–99)
Glucose-Capillary: 111 mg/dL — ABNORMAL HIGH (ref 70–99)
Glucose-Capillary: 114 mg/dL — ABNORMAL HIGH (ref 70–99)
Glucose-Capillary: 117 mg/dL — ABNORMAL HIGH (ref 70–99)
Glucose-Capillary: 138 mg/dL — ABNORMAL HIGH (ref 70–99)
Glucose-Capillary: 90 mg/dL (ref 70–99)
Glucose-Capillary: 92 mg/dL (ref 70–99)
Glucose-Capillary: 94 mg/dL (ref 70–99)

## 2024-01-06 LAB — CBC
HCT: 33.3 % — ABNORMAL LOW (ref 39.0–52.0)
Hemoglobin: 11 g/dL — ABNORMAL LOW (ref 13.0–17.0)
MCH: 29.9 pg (ref 26.0–34.0)
MCHC: 33 g/dL (ref 30.0–36.0)
MCV: 90.5 fL (ref 80.0–100.0)
Platelets: 167 10*3/uL (ref 150–400)
RBC: 3.68 MIL/uL — ABNORMAL LOW (ref 4.22–5.81)
RDW: 13.9 % (ref 11.5–15.5)
WBC: 13.7 10*3/uL — ABNORMAL HIGH (ref 4.0–10.5)
nRBC: 0 % (ref 0.0–0.2)

## 2024-01-06 MED ORDER — EZETIMIBE 10 MG PO TABS
10.0000 mg | ORAL_TABLET | Freq: Every day | ORAL | Status: DC
  Administered 2024-01-07 – 2024-01-12 (×6): 10 mg via ORAL
  Filled 2024-01-06 (×6): qty 1

## 2024-01-06 MED ORDER — POTASSIUM CHLORIDE 10 MEQ/50ML IV SOLN
10.0000 meq | INTRAVENOUS | Status: AC
  Administered 2024-01-06 (×3): 10 meq via INTRAVENOUS
  Filled 2024-01-06: qty 50

## 2024-01-06 MED ORDER — METOPROLOL TARTRATE 5 MG/5ML IV SOLN
5.0000 mg | Freq: Four times a day (QID) | INTRAVENOUS | Status: DC
  Administered 2024-01-06 – 2024-01-07 (×4): 5 mg via INTRAVENOUS
  Filled 2024-01-06 (×4): qty 5

## 2024-01-06 MED ORDER — HEPARIN (PORCINE) 25000 UT/250ML-% IV SOLN
1350.0000 [IU]/h | INTRAVENOUS | Status: AC
  Administered 2024-01-06: 1200 [IU]/h via INTRAVENOUS
  Administered 2024-01-07: 1350 [IU]/h via INTRAVENOUS
  Filled 2024-01-06 (×2): qty 250

## 2024-01-06 MED ORDER — PANTOPRAZOLE SODIUM 40 MG IV SOLR
40.0000 mg | Freq: Every day | INTRAVENOUS | Status: DC
  Administered 2024-01-06 – 2024-01-07 (×2): 40 mg via INTRAVENOUS
  Filled 2024-01-06 (×2): qty 10

## 2024-01-06 NOTE — Progress Notes (Signed)
 Physical Therapy Evaluation Patient Details Name: Tyler David MRN: [id number] DOB: [birth year] Today's Date: 01/06/2024  History of Present Illness  78 y.o. male presents to [facility] 01/04/24 for CABGx3.3/21 CT head showed small left frontal strokes. PMHx:  CKD stage IIIb, hypertension, hyperlipidemia, prostate cancer  Clinical Impression  Pt in bed upon arrival and agreeable to PT eval. PTA, pt was independent with no AD for mobility. In today's session, pt had no active movement in R LE and had decreased alertness to light touch around L4 dermatome. Used chair egress function to move pt into seated position with TotalA. Pt required TotalAx2 with use of bed pad to shift hips forwards. Once in sitting, pt was able to maintain balance with CGA for ~2 minutes before becoming fatigued with a posterior lean. Attempted x2 stands with TotalAx2 and 2HH with use of bed pad. Pt was unable to maintain balance for greater than 3 seconds with blocking of B knees. Pt currently with functional limitations due to the deficits listed below (see PT Problem List). Pt would benefit from acute skilled PT to address functional impairments. Recommending post-acute rehab >3hrs to work towards independence with mobility. Acute PT to follow.  BP at beginning of session- 156/63, 90 BP at end of session- 156/96, 104 85-95 BPM with activity, 94% on 3L         If plan is discharge home, recommend the following: A lot of help with walking and/or transfers;A lot of help with bathing/dressing/bathroom;Assistance with cooking/housework;Assist for transportation;Help with stairs or ramp for entrance   Can travel by private vehicle   No     Equipment Recommendations Rolling walker (2 wheels);BSC/3in1;Wheelchair (measurements PT);Wheelchair cushion (measurements PT);Hoyer lift  Recommendations for [employer]  Rehab consult    Functional Status Assessment Patient has had a recent decline in their functional status and  demonstrates the ability to make significant improvements in function in a reasonable and predictable amount of time.     Precautions / Restrictions Precautions Precautions: Fall;Sternal Precaution Booklet Issued: Yes (comment) Recall of Precautions/Restrictions: Impaired (pt lethargic throughout session and unable to verbalize precautions) Precaution/Restrictions Comments: chest tube, catheter Restrictions Weight Bearing Restrictions Per Provider Order: Yes Other Position/Activity Restrictions: sternal      Mobility  Bed Mobility Overal bed mobility: Needs Assistance Bed Mobility: Supine to Sit, Sit to Supine    Supine to sit: Total assist Sit to supine: Total assist   General bed mobility comments: Used chair function to move pt into sitting, TotalAx2 to scoot pt towards EOB with use of bed pad.    Transfers Overall transfer level: Needs assistance Equipment used: 2 person hand held assist Transfers: Sit to/from Stand Sit to Stand: Total assist, +2 physical assistance, +2 safety/equipment    General transfer comment: x2 attempts to stand with TotalAx2 and use of bed pad. Blocking of B LE as pt's LE's tended to slide forwards. Pt unable to maintain stand with totalAx2 for more than ~3 seconds.    Ambulation/Gait    General Gait Details: unable this date    Modified Rankin (Stroke Patients Only) Modified Rankin (Stroke Patients Only) Pre-Morbid Rankin Score: No symptoms Modified Rankin: Severe disability     Balance Overall balance assessment: Needs assistance, Mild deficits observed, not formally tested Sitting-balance support: Bilateral upper extremity supported, Feet supported Sitting balance-Leahy Scale: Fair Sitting balance - Comments: Able to maintain balance for ~2 minutes with CGA before having posterior lean requriring MinA Postural control: Posterior lean Standing balance support: Bilateral upper  extremity supported, During functional activity, Reliant on  assistive device for balance Standing balance-Leahy Scale: Zero Standing balance comment: unable to maintain balance without totalAx2          Pertinent Vitals/Pain Pain Assessment Pain Assessment: Faces Faces Pain Scale: Hurts even more Pain Location: generalized with movement Pain Descriptors / Indicators: Grimacing, Discomfort Pain Intervention(s): Limited activity within patient's tolerance, Monitored during session, Repositioned    Home Living Family/patient expects to be discharged to:: Private residence Living Arrangements: Spouse/significant other;Children Available Help at Discharge: Family;Available 24 hours/day Type of Home: House Home Access: Level entry      Home Layout: One level Home Equipment: None      Prior Function Prior Level of Function : Independent/Modified Independent      Mobility Comments: Ind w/ no AD ADLs Comments: Ind     Extremity/Trunk Assessment   Upper Extremity Assessment Upper Extremity Assessment: Defer to OT evaluation    Lower Extremity Assessment Lower Extremity Assessment: RLE deficits/detail;LLE deficits/detail RLE Deficits / Details: no evidence of quad contraction or ankle DF/PF, reports decreased alertness to light touch at L4 dermatome LLE Deficits / Details: grossly 4/5, alert to light touch    Cervical / Trunk Assessment Cervical / Trunk Assessment: Normal  Communication   Communication Communication: Impaired Factors Affecting Communication: Reduced clarity of speech;Difficulty expressing self    Cognition Arousal: Lethargic Behavior During Therapy: Flat affect   PT - Cognitive impairments: No family/caregiver present to determine baseline, Sequencing, Initiation, Problem solving, Safety/Judgement        PT - Cognition Comments: Lethargic through session with increased time needed to answer questions and follow commands. Would occasionally close eyes. Following commands: Impaired Following commands  impaired: Follows one step commands with increased time     Cueing Cueing Techniques: Verbal cues, Tactile cues, Visual cues      PT Assessment Patient needs continued PT services  PT Problem List Decreased strength;Decreased range of motion;Decreased activity tolerance;Decreased balance;Decreased mobility;Decreased knowledge of use of DME;Decreased coordination;Decreased cognition;Decreased safety awareness;Decreased knowledge of precautions       PT Treatment Interventions Gait training;DME instruction;Stair training;Functional mobility training;Therapeutic activities;Therapeutic exercise;Balance training;Neuromuscular re-education;Patient/family education    PT Goals (Current goals can be found in the Care Plan section)  Acute Rehab PT Goals Patient Stated Goal: to get better PT Goal Formulation: With patient Time For Goal Achievement: 01/20/24 Potential to Achieve Goals: Good    Frequency Min 3X/week        AM-PAC PT "6 Clicks" Mobility  Outcome Measure Help needed turning from your back to your side while in a flat bed without using bedrails?: Total Help needed moving from lying on your back to sitting on the side of a flat bed without using bedrails?: Total Help needed moving to and from a bed to a chair (including a wheelchair)?: Total Help needed standing up from a chair using your arms (e.g., wheelchair or bedside chair)?: Total Help needed to walk in hospital room?: Total Help needed climbing 3-5 steps with a railing? : Total 6 Click Score: 6    End of Session Equipment Utilized During Treatment: Oxygen Activity Tolerance: Patient limited by fatigue;Patient limited by lethargy Patient left: in bed;with call bell/phone within reach;with nursing/sitter in room Nurse Communication: Mobility status (RN present during session) PT Visit Diagnosis: Unsteadiness on feet (R26.81);Other abnormalities of gait and mobility (R26.89);Muscle weakness (generalized)  (M62.81);Hemiplegia and hemiparesis Hemiplegia - Right/Left: Right Hemiplegia - caused by: Cerebral infarction    Time: 1610-9604 PT Time Calculation (  min) (ACUTE ONLY): 21 min   Charges:   PT Evaluation $PT Eval Moderate Complexity: 1 Mod   PT General Charges $$ ACUTE PT VISIT: 1 Visit       Hilton Cork, PT, DPT Secure Chat Preferred  Rehab Office [phone]   Arturo Morton Brion Aliment 01/06/2024, 10:37 AM

## 2024-01-06 NOTE — Progress Notes (Signed)
 [address]       [place] [address]             [phone]      2 Days Post-Op  Procedure(s) (LRB): CORONARY ARTERY BYPASS GRAFTING TIMES THREE USING LEFT INTERNAL MAMMARY ARTERY AND RIGHT GREAT SAPHENOUS VEIN HARVESTED ENDOSCOPICALLY (N/A) ECHOCARDIOGRAM, TRANSESOPHAGEAL, INTRAOPERATIVE (N/A)   Total Length of Stay:  LOS: 2 days    SUBJECTIVE: Was made NPO since not swallowing well Says he sees double CT with evidence of small CVAs Vitals:   01/06/24 0700 01/06/24 0800  BP: (!) 140/62 (!) 148/62  Pulse: 79 81  Resp: 15 15  Temp: 99.7 F (37.6 C) 99.7 F (37.6 C)  SpO2: 95% 95%    Intake/Output      03/21 0701 03/22 0700 03/22 0701 03/23 0700   P.O. 100    I.V. (mL/kg) 302.1 (3.3) 10 (0.1)   Blood     IV Piggyback 399.9    Total Intake(mL/kg) 802 (8.9) 10 (0.1)   Urine (mL/kg/hr) 985 (0.5)    Blood     Chest Tube 120    Total Output 1105    Net -303 +10            sodium chloride 10 mL/hr at 01/06/24 0800   albumin human     amiodarone      CBC    Component Value Date/Time   WBC 13.7 (H) 01/06/2024 0517   RBC 3.68 (L) 01/06/2024 0517   HGB 11.0 (L) 01/06/2024 0517   HGB 14.3 12/11/2023 1138   HCT 33.3 (L) 01/06/2024 0517   HCT 44.1 12/11/2023 1138   PLT 167 01/06/2024 0517   PLT 247 12/11/2023 1138   MCV 90.5 01/06/2024 0517   MCV 90 12/11/2023 1138   MCV 90 09/16/2014 0831   MCH 29.9 01/06/2024 0517   MCHC 33.0 01/06/2024 0517   RDW 13.9 01/06/2024 0517   RDW 12.1 12/11/2023 1138   RDW 12.7 09/16/2014 0831   CMP     Component Value Date/Time   NA 140 01/06/2024 0517   NA 141 12/11/2023 1138   NA 137 09/16/2014 0831   K 3.6 01/06/2024 0517   K 3.9 09/16/2014 0831   CL 109 01/06/2024 0517   CL 101 09/16/2014 0831   CO2 23 01/06/2024 0517   CO2 27 09/16/2014 0831   GLUCOSE 109 (H) 01/06/2024 0517   GLUCOSE 100 (H) 09/16/2014 0831   BUN 35 (H) 01/06/2024 0517   BUN 38 (H) 12/11/2023 1138   BUN 22 (H)  09/16/2014 0831   CREATININE 2.41 (H) 01/06/2024 0517   CREATININE 1.35 (H) 09/16/2014 0831   CALCIUM 7.7 (L) 01/06/2024 0517   CALCIUM 10.3 (H) 09/16/2014 0831   PROT 6.7 01/02/2024 1330   PROT 6.5 12/11/2023 1138   ALBUMIN 3.3 (L) 01/02/2024 1330   ALBUMIN 4.0 12/11/2023 1138   AST 18 01/02/2024 1330   ALT 16 01/02/2024 1330   ALKPHOS 55 01/02/2024 1330   BILITOT 0.9 01/02/2024 1330   BILITOT 0.7 12/11/2023 1138   GFRNONAA 27 (L) 01/06/2024 0517   GFRNONAA 56 (L) 09/16/2014 0831   GFRAA 50 (L) 10/17/2015 0518   GFRAA >60 09/16/2014 0831   ABG    Component Value Date/Time   PHART 7.306 (L) 01/04/2024 2032   PCO2ART 34.8 01/04/2024 2032   PO2ART 71 (L) 01/04/2024 2032   HCO3 17.3 (L) 01/04/2024 2032   TCO2 18 (L) 01/04/2024 2032  ACIDBASEDEF 8.0 (H) 01/04/2024 2032   O2SAT 75.7 01/05/2024 0830   CBG (last 3)  Recent Labs    01/05/24 2305 01/05/24 2359 01/06/24 0258  GLUCAP 117* 114* 111*  Exam Lungs: decreased at bases Card: RR Ext: edematous Neuro: responds to commands, very week all around but much weaker on R leg   ASSESSMENT: POD #3 CABG  Hemodynamics ok. He was in rapid afib yesterday and amio started causing long pauses. No Pacing wires so had to stop amio. He converted this am to NSR Will add IV BB secondary to him being NPO.  Hold on BB for now AKI: secondary to CPB. Will follow for now until starts to decrease then start diuretic Neuro: evidence of CVA by CT scan. Will have Neuro see today. Get swallowing study. May need tube feeds Hold on anticoagulation for now   Eugenio Hoes, MD 01/06/2024

## 2024-01-06 NOTE — Consult Note (Signed)
 NEUROLOGY CONSULT NOTE   Date of service: January 06, 2024 Patient Name: Tyler David MRN:  [id number] DOB:  August 25, 1946 Chief Complaint: Right lower extremity weakness Requesting Provider: Corliss Skains, MD  History of Present Illness  Tyler David is a 78 y.o. male with hx of HFrEF (ejection fraction of 35%), PVCs, LBBB, CAD, CKD IIIb, HTN, HLD, prior history of prostate cancer who is status post CABG x 3 on 3/20 followed by admission to the ICU for postoperative care. Yesterday, the patient was noted to have RLE weakness. CT head was obtained which showed a small left posterior-medial frontal lobe stroke in the leg region of the motor homunculus. Overnight, he went into A-fib with RVR - amiodarone was started but this led to pauses on telemetry, so the amiodarone was then stopped.  Neurology has been consulted for stroke assessment.     ROS  Unable to ascertain due to lethargy.  Past History   Past Medical History:  Diagnosis Date   Arthritis    Cancer (HCC)    prostate cancer    Cardiomyopathy (HCC)    Chronic kidney disease, stage 3 (HCC)    Coronary artery disease    ED (erectile dysfunction)    GERD (gastroesophageal reflux disease)    Headache    hx of migraine-2015    Hyperlipidemia    Hypertension    Hypothyroidism    LBBB (left bundle branch block)    Migraine    Nocturia    Overactive bladder    Pneumonia    hx of at age 39     Past Surgical History:  Procedure Laterality Date   CARPAL TUNNEL RELEASE Bilateral    CORONARY ARTERY BYPASS GRAFT N/A 01/04/2024   Procedure: CORONARY ARTERY BYPASS GRAFTING TIMES THREE USING LEFT INTERNAL MAMMARY ARTERY AND RIGHT GREAT SAPHENOUS VEIN HARVESTED ENDOSCOPICALLY;  Surgeon: Corliss Skains, MD;  Location: [facility] OR;  Service: Open Heart Surgery;  Laterality: N/A;   INTRAOPERATIVE TRANSESOPHAGEAL ECHOCARDIOGRAM N/A 01/04/2024   Procedure: ECHOCARDIOGRAM, TRANSESOPHAGEAL, INTRAOPERATIVE;  Surgeon: Corliss Skains, MD;  Location: [facility] OR;  Service: Open Heart Surgery;  Laterality: N/A;   PROSTATE SURGERY     RIGHT/LEFT HEART CATH AND CORONARY ANGIOGRAPHY Bilateral 12/15/2023   Procedure: RIGHT/LEFT HEART CATH AND CORONARY ANGIOGRAPHY;  Surgeon: Yvonne Kendall, MD;  Location: [facility] INVASIVE CV LAB;  Service: Cardiovascular;  Laterality: Bilateral;   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   Procedure: LEFT TOTAL HIP ARTHROPLASTY ANTERIOR APPROACH;  Surgeon: Durene Romans, MD;  Location: [facility] ORS;  Service: Orthopedics;  Laterality: Left;    Family History: Family History  Problem Relation Age of Onset   Ovarian cancer Mother    Heart failure Father    COPD Father    Atrial fibrillation Brother    Prostate cancer Brother     Social History  reports that he has never smoked. He has never used smokeless tobacco. He reports that he does not currently use alcohol. He reports that he does not use drugs.  Allergies  Allergen Reactions   Atorvastatin     myalgias  Other Reaction(s): myalgias   Pravachol [Pravastatin]     Muscle aches   Simvastatin    Statins Other (See Comments)    Muscle pain   Penicillins Rash    Has patient had a PCN reaction causing immediate rash, facial/tongue/throat swelling, SOB or lightheadedness with hypotension: No Has patient had a PCN reaction causing severe rash involving mucus membranes or skin necrosis:  No Has patient had a PCN reaction that required hospitalization No Has patient had a PCN reaction occurring within the last 10 years: No If all of the above answers are "NO", then may proceed with Cephalosporin use.     Medications   Current Facility-Administered Medications:    0.9 %  sodium chloride infusion, , Intravenous, PRN, Corliss Skains, MD, Paused at 01/06/24 [phone]   acetaminophen (TYLENOL) tablet 1,000 mg, 1,000 mg, Oral, Q6H, 1,000 mg at 01/06/24 0520 **OR** acetaminophen (TYLENOL) 160 MG/5ML solution 1,000 mg, 1,000 mg, Per Tube, Q6H,  Gold, [name], PA-C   albumin human 5 % solution 12.5 g, 250 mL, Intravenous, Q15 min PRN, Gold, [name], PA-C   amLODipine (NORVASC) tablet 5 mg, 5 mg, Oral, Daily, Lightfoot, Harrell O, MD, 5 mg at 01/05/24 1206   aspirin EC tablet 325 mg, 325 mg, Oral, Daily, 325 mg at 01/05/24 1453 **OR** aspirin chewable tablet 324 mg, 324 mg, Per Tube, Daily, Gold, [name], PA-C   bisacodyl (DULCOLAX) EC tablet 10 mg, 10 mg, Oral, Daily **OR** bisacodyl (DULCOLAX) suppository 10 mg, 10 mg, Rectal, Daily, Gold, [name], PA-C   Chlorhexidine Gluconate Cloth 2 % PADS 6 each, 6 each, Topical, Daily, Lightfoot, Eliezer Lofts, MD, 6 each at 01/05/24 1838   docusate sodium (COLACE) capsule 200 mg, 200 mg, Oral, Daily, Gold, [name], PA-C   enoxaparin (LOVENOX) injection 30 mg, 30 mg, Subcutaneous, QHS, Lightfoot, Harrell O, MD, 30 mg at 01/05/24 2010   ezetimibe (ZETIA) tablet 10 mg, 10 mg, Oral, Daily, Chand, Sudham, MD   CBG monitoring, , , Q4H **AND** insulin aspart (novoLOG) injection 0-24 Units, 0-24 Units, Subcutaneous, Q4H, Lightfoot, Harrell O, MD, 2 Units at 01/05/24 2028   insulin aspart (novoLOG) injection 0-9 Units, 0-9 Units, Subcutaneous, TID WC, Cheri Fowler, MD   levothyroxine (SYNTHROID) tablet 50 mcg, 50 mcg, Oral, QAC breakfast, Gold, [name], PA-C, 50 mcg at 01/06/24 5284   metoprolol tartrate (LOPRESSOR) tablet 12.5 mg, 12.5 mg, Oral, BID, 12.5 mg at 01/05/24 2010 **OR** metoprolol tartrate (LOPRESSOR) 25 mg/10 mL oral suspension 12.5 mg, 12.5 mg, Per Tube, BID, Gold, [name], PA-C   metoprolol tartrate (LOPRESSOR) injection 2.5-5 mg, 2.5-5 mg, Intravenous, Q2H PRN, Gold, [name], PA-C, 2.5 mg at 01/06/24 0940   metoprolol tartrate (LOPRESSOR) injection 5 mg, 5 mg, Intravenous, Q6H, Eugenio Hoes, MD   midazolam (VERSED) injection 2 mg, 2 mg, Intravenous, Q1H PRN, Gold, [name], PA-C, 2 mg at 01/04/24 1347   morphine (PF) 2 MG/ML injection 1-4 mg, 1-4 mg, Intravenous, Q1H PRN, Gold, [name], PA-C, 2  mg at 01/05/24 0039   naloxone (NARCAN) injection 0.4 mg, 0.4 mg, Intravenous, PRN, Cliffton Asters, Eliezer Lofts, MD, 0.4 mg at 01/05/24 1451   ondansetron (ZOFRAN) injection 4 mg, 4 mg, Intravenous, Q6H PRN, Gold, [name], PA-C   Oral care mouth rinse, 15 mL, Mouth Rinse, PRN, Lightfoot, Harrell O, MD   oxyCODONE (Oxy IR/ROXICODONE) immediate release tablet 5-10 mg, 5-10 mg, Oral, Q3H PRN, Gold, [name], PA-C, 5 mg at 01/05/24 0415   pantoprazole (PROTONIX) injection 40 mg, 40 mg, Intravenous, QHS, Lightfoot, Harrell O, MD  Vitals   Vitals:   01/06/24 0845 01/06/24 0900 01/06/24 0915 01/06/24 0930  BP: (!) 156/96 (!) 150/63 (!) 156/63 (!) 149/67  Pulse: 85 84 85 76  Resp: 15 16 16 17   Temp: 99.7 F (37.6 C) 99.9 F (37.7 C) 99.7 F (37.6 C) 99.7 F (37.6 C)  TempSrc:      SpO2: 94% 94% 94% 95%  Weight:      Height:        Body mass index is 30.27 kg/m.  Physical Exam   Physical Exam  HEENT:  /AT Lungs: Respirations unlabored Extremities: Warm and well perfused.   Neurological Examination Mental Status: Lethargic with increased latencies of verbal and motor responses. Oriented to the city and state, but not to time.  Speech is slow and dysarthric. Not conversing in complete sentences; communicates with short phrases only. Able to follow several simple commands.   Cranial Nerves: II: Visual fields are grossly intact bilaterally in the context of poor responsiveness to detailed instructions/questions.  PERRL.  III,IV, VI: No ptosis. Eyes are conjugate at the midline. He does not track or initiate saccades to left or right despite multiple attempts. No nystagmus.  V: Temp sensation equal bilaterally VII: Smile symmetric VIII: Hearing intact to questions and commands IX,X: Hypophonic speech XI: Head is midline XII: Midline tongue extension Motor: RUE 4/5 proximally and distally RLE 1-2/5 proximally and distally LUE 0/5 except for 1-2/5 deltoid and biceps LLE 3-4/5 proximally  and distally Sensory: Temp and light touch intact to all 4 extremities. No extinction to DSS.  Deep Tendon Reflexes: 1+ and symmetric bilateral brachioradialis and patellae. Toes equivocal bilaterally.  Cerebellar: No ataxia with FNF on the right. Unable to perform on the left.  Gait: Unable to assess   Labs/Imaging/Neurodiagnostic studies   CBC:  Recent Labs  Lab 01/28/2024 1846 01/06/24 0517  WBC 14.5* 13.7*  HGB 11.4* 11.0*  HCT 34.6* 33.3*  MCV 90.8 90.5  PLT 185 167   Basic Metabolic Panel:  Lab Results  Component Value Date   NA 140 01/06/2024   K 3.6 01/06/2024   CO2 23 01/06/2024   GLUCOSE 109 (H) 01/06/2024   BUN 35 (H) 01/06/2024   CREATININE 2.41 (H) 01/06/2024   CALCIUM 7.7 (L) 01/06/2024   GFRNONAA 27 (L) 01/06/2024   GFRAA 50 (L) 10/17/2015   Lipid Panel: No results found for: "LDLCALC" HgbA1c:  Lab Results  Component Value Date   HGBA1C 5.4 01/02/2024   Urine Drug Screen: No results found for: "LABOPIA", "COCAINSCRNUR", "LABBENZ", "AMPHETMU", "THCU", "LABBARB"  Alcohol Level No results found for: "ETH" INR  Lab Results  Component Value Date   INR 1.4 (H) 01/04/2024   APTT  Lab Results  Component Value Date   APTT 35 01/04/2024     ASSESSMENT  BRINLEY TREANOR is a 78 y.o. male with acute perioperative left medial frontal lobe stroke after CABG, with resultant LLE weakness. Exam also suspicious for an additional right motor strip stroke causing LUE weakness.   - Exam reveals a somnolent/lethargic patient with increased latencies of verbal and motor responses, gaze paresis with fixed midlline gaze, grossly intact visual fields, reactive and equal pupils, nearly plegic LUE, nearly plegic RLE and intact sensation x 4.  - CT head: Age indeterminate likely acute/subacute infarct in the inferior left cerebellum. Multiple foci of cortical infarction in the medial left frontal lobe, ACA distribution, also likely acute. No acute hemorrhage. - New onset  atrial fibrillation postoperatively - Poor renal function with eGFR < 30 - Impression: Multifocal perioperative strokes. DDx for underlying etiology includes cardiac mural thrombus and new onset atrial fibrillation  RECOMMENDATIONS  - Repeat TTE to assess for possible mural thrombus - For secondary stroke prevention in the setting of atrial fibrillation, we are starting heparin gtt no-bolus neurology protocol after  discussion with CT Surgery (Pharmacy consult placed and have spoken directly with Pharmacist) - Also can get MRI brain per CT Surgery (ordered) - MRA head - Carotid ultrasound - Has a statin allergy. - Cardiac telemetry - Frequent neuro checks - PT/OT/Speech - HgbA1c, fasting lipid panel - NPO until passes stroke swallow screen - Stroke Team to follow   45 minutes spent in the neurological evaluation and management of this critically ill patient ______________________________________________________________________   Dessa Phi, Mat Stuard, MD [facility]

## 2024-01-06 NOTE — Progress Notes (Signed)
 NAME:  Tyler David, MRN:  [id number], DOB:  01/20/1946, LOS: 2 ADMISSION DATE:  01/04/2024, CONSULTATION DATE:  01/04/2024 REFERRING MD: Dr. Cliffton Asters, CHIEF COMPLAINT: Status post CABG  History of Present Illness:  78 year old male with CKD stage IIIb, hypertension, hyperlipidemia, prior history of prostate cancer who initially presented with chest pain, shortness of breath and x-ray chest was suggestive of pulmonary edema.  He underwent echocardiogram which showed EF 35-40%.  Underwent cardiac cath and was diagnosed with multivessel obstructive coronary artery disease.  Patient underwent CABG x 3 today, remained on dobutamine, intubated and was transferred to ICU.  PCCM was consulted for help evaluation medical management  Pertinent  Medical History   Past Medical History:  Diagnosis Date   Arthritis    Cancer [facility])    prostate cancer    Cardiomyopathy (HCC)    Chronic kidney disease, stage 3 (HCC)    Coronary artery disease    ED (erectile dysfunction)    GERD (gastroesophageal reflux disease)    Headache    hx of migraine-2015    Hyperlipidemia    Hypertension    Hypothyroidism    LBBB (left bundle branch block)    Migraine    Nocturia    Overactive bladder    Pneumonia    hx of at age 67     Significant Hospital Events: Including procedures, antibiotic start and stop dates in addition to other pertinent events     Interim History / Subjective:  Yesterday patient was noted to have right leg weakness with intact sensation CT head was done which showed small left frontal strokes Overnight he went into A-fib with RVR, was started on amiodarone that led to pauses on telemetry.  Amiodarone was stopped Remain afebrile Serum creatinine is elevated but is stable Off dobutamine  Objective   Blood pressure (!) 149/67, pulse 76, temperature 99.7 F (37.6 C), resp. rate 17, height 5\' 8"  (1.727 m), weight 90.3 kg, SpO2 95%.        Intake/Output Summary (Last 24 hours) at  01/06/2024 0948 Last data filed at 01/06/2024 0900 Gross per 24 hour  Intake 752.04 ml  Output 1110 ml  Net -357.96 ml   Filed Weights   01/04/24 0549 01/05/24 0600 01/06/24 0327  Weight: 85.3 kg 90.4 kg 90.3 kg    Examination: General: Acute on chronically ill-appearing male, lying on the bed HEENT: [place]/AT, eyes anicteric.  moist mucus membranes Neuro: Lethargic/generalized weak, opens eyes with vocal stimuli, following simple commands, right lower leg is plegic, antigravity in all other extremities.  No facial droop Chest: Coarse breath sounds, no wheezes or rhonchi Heart: Regular rate and rhythm, no murmurs or gallops Abdomen: Soft, nontender, nondistended, bowel sounds present Skin: No rash  Labs and images reviewed  Resolved Hospital Problem list   NA  Assessment & Plan:  Coronary artery disease s/p CABG x 3 Continue aspirin Patient is intolerant to statin causing myalgia Started on Zetia Chest tube management TCTS Chest tube output is 120 since coming back from the OR Continue pain control with tramadol, oxycodone and morphine Closely monitor chest tube output  Acute respiratory failure with hypoxia due to atelectasis Patient was successfully extubated on postop day 0, required BiPAP initially Current nasal cannula oxygen, titrate with O2 sat goal 92% Treat incentive spirometry and flutter valve  Chronic HFrEF Paroxysmal A-fib with RVR Patient ejection fraction is 35 to 40% He required dobutamine initially postop Coox was 75%, dobutamine was discontinued Monitor intake and output  Went into A-fib with RVR overnight with heart rate in 130s Was started on IV amiodarone with that patient had multiple pauses on telemetry, IV amiodarone was discontinued  Converted back to sinus rhythm Continue metoprolol IV as patient failed swallow evaluation CHA2DS2-VASc score is 6  Acute multifocal left frontal stroke Postop patient was noted to have right leg weakness CT head  was done which showed left frontal strokes Please add aspirin and Zetia He may require anticoagulation considering paroxysmal A-fib Neurology consulted  Hypertension Off antihypertensive considering acute stroke to have permissive hypertension  AKI on CKD stage IIIb Serum creatinine is at baseline around 1.3-1.5 Monitor intake and output Serum creatinine trended up to 2.4 Avoid nephrotoxic agent Diuretics deferred to TCTS  Expected perioperative blood loss anemia Monitor H/H   Best Practice (right click and "Reselect all SmartList Selections" daily)   Diet/type: NPO/nutrition swallow evaluation pending DVT prophylaxis: Subcu Lovenox GI prophylaxis: N/A Lines: Central line Foley:  Yes, and it is still needed Code Status:  full code Last date of multidisciplinary goals of care discussion 3/22: Patient and his wife were updated at bedside   Labs   CBC: Recent Labs  Lab 01/04/24 1244 01/04/24 1247 01/04/24 1830 01/04/24 2032 01/05/24 0515 01/05/24 1846 01/06/24 0517  WBC 14.0*  --  16.8*  --  13.6* 14.5* 13.7*  HGB 11.2*   < > 12.1* 10.2* 10.9* 11.4* 11.0*  HCT 33.2*   < > 36.7* 30.0* 33.6* 34.6* 33.3*  MCV 90.2  --  90.4  --  89.6 90.8 90.5  PLT 134*  --  171  --  168 185 167   < > = values in this interval not displayed.    Basic Metabolic Panel: Recent Labs  Lab 01/02/24 1330 01/04/24 0816 01/04/24 1150 01/04/24 1247 01/04/24 1830 01/04/24 2032 01/05/24 0515 01/05/24 1846 01/06/24 0517  NA 140   < > 142   < > 141 143 137 137 140  K 4.4   < > 4.4   < > 4.3 3.3* 3.9 4.0 3.6  CL 106   < > 114*  --  108  --  110 106 109  CO2 28  --   --   --  20*  --  22 22 23   GLUCOSE 79   < > 127*  --  154*  --  129* 129* 109*  BUN 25*   < > 27*  --  27*  --  27* 33* 35*  CREATININE 1.61*   < > 1.30*  --  1.73*  --  1.97* 2.38* 2.41*  CALCIUM 8.7*  --   --   --  8.1*  --  7.3* 8.0* 7.7*  MG  --   --   --   --  3.6*  --  2.9* 2.7*  --    < > = values in this interval  not displayed.   GFR: Estimated Creatinine Clearance: 27.6 mL/min (A) (by C-G formula based on SCr of 2.41 mg/dL (H)). Recent Labs  Lab 01/04/24 1830 01/05/24 0515 01/05/24 1846 01/06/24 0517  WBC 16.8* 13.6* 14.5* 13.7*    Liver Function Tests: Recent Labs  Lab 01/02/24 1330  AST 18  ALT 16  ALKPHOS 55  BILITOT 0.9  PROT 6.7  ALBUMIN 3.3*   No results for input(s): "LIPASE", "AMYLASE" in the last 168 hours. No results for input(s): "AMMONIA" in the last 168 hours.  ABG    Component Value Date/Time   PHART 7.306 (  L) 01/04/2024 2032   PCO2ART 34.8 01/04/2024 2032   PO2ART 71 (L) 01/04/2024 2032   HCO3 17.3 (L) 01/04/2024 2032   TCO2 18 (L) 01/04/2024 2032   ACIDBASEDEF 8.0 (H) 01/04/2024 2032   O2SAT 75.7 01/05/2024 0830     Coagulation Profile: Recent Labs  Lab 01/02/24 1330 01/04/24 1244  INR 1.1 1.4*    Cardiac Enzymes: No results for input(s): "CKTOTAL", "CKMB", "CKMBINDEX", "TROPONINI" in the last 168 hours.  HbA1C: Hgb A1c MFr Bld  Date/Time Value Ref Range Status  01/02/2024 01:30 PM 5.4 4.8 - 5.6 % Final    Comment:    (NOTE) Pre diabetes:          5.7%-6.4%  Diabetes:              >6.4%  Glycemic control for   <7.0% adults with diabetes     CBG: Recent Labs  Lab 01/05/24 1636 01/05/24 2028 01/05/24 2305 01/05/24 2359 01/06/24 0258  GLUCAP 111* 138* 117* 114* 111*     Cheri Fowler, MD [facility] See Amion for pager If no response to pager, please call [phone] until 7pm After 7pm, Please call E-link [phone]

## 2024-01-06 NOTE — Evaluation (Signed)
 Clinical/Bedside Swallow Evaluation Patient Details  Name: Tyler David MRN: [id number] Date of Birth: Dec 13, 1945  Today's Date: 01/06/2024 Time: SLP Start Time (ACUTE ONLY): 1700 SLP Stop Time (ACUTE ONLY): 1720 SLP Time Calculation (min) (ACUTE ONLY): 20 min  Past Medical History:  Past Medical History:  Diagnosis Date   Arthritis    Cancer (HCC)    prostate cancer    Cardiomyopathy (HCC)    Chronic kidney disease, stage 3 (HCC)    Coronary artery disease    ED (erectile dysfunction)    GERD (gastroesophageal reflux disease)    Headache    hx of migraine-2015    Hyperlipidemia    Hypertension    Hypothyroidism    LBBB (left bundle branch block)    Migraine    Nocturia    Overactive bladder    Pneumonia    hx of at age 9    Past Surgical History:  Past Surgical History:  Procedure Laterality Date   CARPAL TUNNEL RELEASE Bilateral    CORONARY ARTERY BYPASS GRAFT N/A 01/04/2024   Procedure: CORONARY ARTERY BYPASS GRAFTING TIMES THREE USING LEFT INTERNAL MAMMARY ARTERY AND RIGHT GREAT SAPHENOUS VEIN HARVESTED ENDOSCOPICALLY;  Surgeon: Corliss Skains, MD;  Location: [facility] OR;  Service: Open Heart Surgery;  Laterality: N/A;   INTRAOPERATIVE TRANSESOPHAGEAL ECHOCARDIOGRAM N/A 01/04/2024   Procedure: ECHOCARDIOGRAM, TRANSESOPHAGEAL, INTRAOPERATIVE;  Surgeon: Corliss Skains, MD;  Location: [facility] OR;  Service: Open Heart Surgery;  Laterality: N/A;   PROSTATE SURGERY     RIGHT/LEFT HEART CATH AND CORONARY ANGIOGRAPHY Bilateral 12/15/2023   Procedure: RIGHT/LEFT HEART CATH AND CORONARY ANGIOGRAPHY;  Surgeon: Yvonne Kendall, MD;  Location: [facility] INVASIVE CV LAB;  Service: Cardiovascular;  Laterality: Bilateral;   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   Procedure: LEFT TOTAL HIP ARTHROPLASTY ANTERIOR APPROACH;  Surgeon: Durene Romans, MD;  Location: [facility]S;  Service: Orthopedics;  Laterality: Left;   HPI:  Patient is a 78 y.o. male with PMH: GERD, HLD, HTN, CKD stage III,  arthritis, prostate cancer. He was recently diagnosed with CHF and placed on diuretics. He underwent CABG x3 on 01/05/24 and extubated post-surgery to BiPAP and progressed to oxygen via [place]. Following surgery, he was noted to have right leg weakness with intact sensation. 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 MRI brain pending. He failed [facility] swallow with RN on 3/22 and was kept NPO awaiting SLP swallow evaluation.    Assessment / Plan / Recommendation  Clinical Impression  Patient is presenting with clinical s/s of dysphagia as per this bedside swallow evaluation. Per RN, he has been lethargic all day today. Patient able to follow basic commands but overall alertness and ability to maintain alertness and attention is impaired. He is oriented to being in a hospital but not to time, stating month as "December". SLP assessed his swallow via thin liquids, nectar thick liquids, puree solids. Thin liquids (water) given via spoon sip, then cup and straw. Oral acceptance of water into mouth via spoon was somewhat delayed. SLP suspects swallow initiation to be delayed as well, with spoon, cup and straw sips. No immediate coughing but he did exhibit delayed throat clearing. With puree solids he exhibited poor lingual transit, requiring sip of liquid to clear residuals. Nectar thick liquids were tolerated without overt s/s during or after intake, but patient's overall alertness puts him at a high risk of aspiration with any consistency. SLP recommending continue NPO status and will plan to f/u next date to determine PO readiness versus  need for objective swallow study. SLP Visit Diagnosis: Dysphagia, unspecified (R13.10)    Aspiration Risk  Mild aspiration risk    Diet Recommendation NPO    Medication Administration: Via alternative means    Other  Recommendations Oral Care Recommendations: Oral care QID;Staff/trained caregiver to provide oral care    Recommendations for follow up therapy are one component  of a multi-disciplinary discharge planning process, led by the attending physician.  Recommendations may be updated based on patient status, additional functional criteria and insurance authorization.  Follow up Recommendations Other (comment) (TBD)      Assistance Recommended at Discharge    Functional Status Assessment Patient has had a recent decline in their functional status and demonstrates the ability to make significant improvements in function in a reasonable and predictable amount of time.  Frequency and Duration min 2x/week  2 weeks       Prognosis Prognosis for improved oropharyngeal function: Good      Swallow Study   General Date of Onset: 01/06/24 HPI: Patient is a 78 y.o. male with PMH: GERD, HLD, HTN, CKD stage III, arthritis, prostate cancer. He was recently diagnosed with CHF and placed on diuretics. He underwent CABG x3 on 01/05/24 and extubated post-surgery to BiPAP and progressed to oxygen via [place]. Following surgery, he was noted to have right leg weakness with intact sensation. 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 MRI brain pending. He failed [place] swallow with RN on 3/22 and was kept NPO awaiting SLP swallow evaluation. Type of Study: Bedside Swallow Evaluation Previous Swallow Assessment: none found Diet Prior to this Study: NPO Temperature Spikes Noted: No Respiratory Status: Nasal cannula History of Recent Intubation: Yes Total duration of intubation (days): 1 days (for surgery) Date extubated: 01/05/24 Behavior/Cognition: Alert;Lethargic/Drowsy;Cooperative;Requires cueing Oral Cavity Assessment: Dry Oral Care Completed by SLP: Yes Oral Cavity - Dentition: Adequate natural dentition Self-Feeding Abilities: Total assist Patient Positioning: Upright in bed Baseline Vocal Quality: Low vocal intensity Volitional Cough: Weak Volitional Swallow: Unable to elicit    Oral/Motor/Sensory Function Overall Oral Motor/Sensory Function: Other (comment) (no  asymmetry observed but patient weak and unable to follow all commands)   Ice Chips     Thin Liquid Thin Liquid: Impaired Presentation: Spoon;Cup;Straw Pharyngeal  Phase Impairments: Suspected delayed Swallow;Throat Clearing - Delayed    Nectar Thick Nectar Thick Liquid: Impaired Presentation: Cup Pharyngeal Phase Impairments: Suspected delayed Swallow   Honey Thick     Puree Puree: Impaired Oral Phase Impairments: Poor awareness of bolus;Reduced lingual movement/coordination Oral Phase Functional Implications: Prolonged oral transit   Solid     Solid: Not tested      Angela Nevin, MA, CCC-SLP Speech Therapy

## 2024-01-06 NOTE — Progress Notes (Signed)
 Inpatient Rehab Admissions Coordinator Note:   Per PT patient was screened for CIR candidacy by Elianah Karis Luvenia Starch, CCC-SLP. At this time, pt does not appear to be able to tolerate the intensity of CIR. Will continue to monitor participation and progress with therapy. Will rescreen in 2-3 days. If pt appears to be an appropriate candidate, a  consult order will be placed.  Wolfgang Phoenix, MS, CCC-SLP Admissions Coordinator [phone] 01/06/24 5:38 PM

## 2024-01-06 NOTE — Progress Notes (Signed)
 PHARMACY - ANTICOAGULATION CONSULT NOTE  Pharmacy Consult for heparin Indication: atrial fibrillation and stroke  Allergies  Allergen Reactions   Atorvastatin     myalgias  Other Reaction(s): myalgias   Pravachol [Pravastatin]     Muscle aches   Simvastatin    Statins Other (See Comments)    Muscle pain   Penicillins Rash    Has patient had a PCN reaction causing immediate rash, facial/tongue/throat swelling, SOB or lightheadedness with hypotension: No Has patient had a PCN reaction causing severe rash involving mucus membranes or skin necrosis: No Has patient had a PCN reaction that required hospitalization No Has patient had a PCN reaction occurring within the last 10 years: No If all of the above answers are "NO", then may proceed with Cephalosporin use.     Patient Measurements: Height: 5\' 8"  (172.7 cm) Weight: 90.3 kg (199 lb 1.2 oz) IBW/kg (Calculated) : 68.4 Heparin Dosing Weight: 85kg  Vital Signs: Temp: 98.6 F (37 C) (03/22 1615) Temp Source: Axillary (03/22 1615) BP: 175/70 (03/22 1700) Pulse Rate: 86 (03/22 1700)  Labs: Recent Labs    01/04/24 1244 01/04/24 1247 01/05/24 0515 01/05/24 1846 01/06/24 0517  HGB 11.2*   < > 10.9* 11.4* 11.0*  HCT 33.2*   < > 33.6* 34.6* 33.3*  PLT 134*   < > 168 185 167  APTT 35  --   --   --   --   LABPROT 17.6*  --   --   --   --   INR 1.4*  --   --   --   --   CREATININE  --    < > 1.97* 2.38* 2.41*   < > = values in this interval not displayed.    Estimated Creatinine Clearance: 27.6 mL/min (A) (by C-G formula based on SCr of 2.41 mg/dL (H)).   Medical History: Past Medical History:  Diagnosis Date   Arthritis    Cancer (HCC)    prostate cancer    Cardiomyopathy (HCC)    Chronic kidney disease, stage 3 (HCC)    Coronary artery disease    ED (erectile dysfunction)    GERD (gastroesophageal reflux disease)    Headache    hx of migraine-2015    Hyperlipidemia    Hypertension    Hypothyroidism    LBBB  (left bundle branch block)    Migraine    Nocturia    Overactive bladder    Pneumonia    hx of at age 19     Medications:  Medications Prior to Admission  Medication Sig Dispense Refill Last Dose/Taking   amLODipine (NORVASC) 5 MG tablet Take 1 tablet (5 mg total) by mouth daily. 90 tablet 3 01/03/2024 at  6:30 PM   Cholecalciferol (VITAMIN D-3) 25 MCG (1000 UT) CAPS Take 1,000 Units by mouth daily.   Past Week   famotidine (PEPCID) 20 MG tablet Take 20 mg by mouth daily.   01/03/2024 at  6:30 PM   furosemide (LASIX) 20 MG tablet Take 1 tablet (20 mg total) by mouth daily as needed (swelling or weight gain (2 pounds in 24 hours or 5 pounds in 1 week)).   Past Week   levothyroxine (SYNTHROID) 50 MCG tablet Take 50 mcg by mouth daily before breakfast.   01/04/2024 at  4:15 AM   MAGNESIUM PO Take 420 mg by mouth daily.   Past Week   metoprolol succinate (TOPROL-XL) 50 MG 24 hr tablet Take 50 mg by mouth at  bedtime. Take with or immediately following a meal.   01/03/2024 at  6:30 PM   Multiple Vitamins-Minerals (ICAPS AREDS 2 PO) Take 1 capsule by mouth 2 (two) times daily.   Past Week   nitroGLYCERIN (NITROSTAT) 0.4 MG SL tablet Place 1 tablet (0.4 mg total) under the tongue every 5 (five) minutes as needed for chest pain. 25 tablet PRN Taking As Needed   Omega-3 Fatty Acids (FISH OIL) 1000 MG CAPS Take 1,000 mg by mouth daily.   Past Week   sacubitril-valsartan (ENTRESTO) 49-51 MG Take 1 tablet by mouth 2 (two) times daily. 180 tablet 3 12/31/2023   Turmeric 500 MG CAPS Take 500 mg by mouth 2 (two) times daily.   Past Week   Scheduled:   acetaminophen  1,000 mg Oral Q6H   Or   acetaminophen (TYLENOL) oral liquid 160 mg/5 mL  1,000 mg Per Tube Q6H   amLODipine  5 mg Oral Daily   aspirin EC  325 mg Oral Daily   Or   aspirin  324 mg Per Tube Daily   bisacodyl  10 mg Oral Daily   Or   bisacodyl  10 mg Rectal Daily   Chlorhexidine Gluconate Cloth  6 each Topical Daily   docusate sodium  200 mg  Oral Daily   enoxaparin (LOVENOX) injection  30 mg Subcutaneous QHS   ezetimibe  10 mg Oral Daily   insulin aspart  0-24 Units Subcutaneous Q4H   levothyroxine  50 mcg Oral QAC breakfast   metoprolol tartrate  12.5 mg Oral BID   Or   metoprolol tartrate  12.5 mg Per Tube BID   metoprolol tartrate  5 mg Intravenous Q6H   pantoprazole (PROTONIX) IV  40 mg Intravenous QHS   Infusions:   albumin human      Assessment: Pt s/p CABG x3. Now with afib and CVA. Ok to start heparin with stroke protocol.  Hgb 11s, plt wnl  Goal of Therapy:  Heparin level 0.3-0.5 units/ml Monitor platelets by anticoagulation protocol: Yes   Plan:  No bolus Heparin 1200 units/hr Check 8 hr HL  Daily HL and CBC  Ulyses Southward, PharmD, BCIDP, AAHIVP, CPP Infectious Disease Pharmacist 01/06/2024 5:15 PM

## 2024-01-07 ENCOUNTER — Encounter (HOSPITAL_COMMUNITY)

## 2024-01-07 ENCOUNTER — Inpatient Hospital Stay (HOSPITAL_COMMUNITY)

## 2024-01-07 DIAGNOSIS — Z951 Presence of aortocoronary bypass graft: Secondary | ICD-10-CM | POA: Diagnosis not present

## 2024-01-07 DIAGNOSIS — I4891 Unspecified atrial fibrillation: Secondary | ICD-10-CM | POA: Diagnosis not present

## 2024-01-07 DIAGNOSIS — R1310 Dysphagia, unspecified: Secondary | ICD-10-CM | POA: Diagnosis not present

## 2024-01-07 DIAGNOSIS — R29898 Other symptoms and signs involving the musculoskeletal system: Secondary | ICD-10-CM

## 2024-01-07 DIAGNOSIS — I639 Cerebral infarction, unspecified: Secondary | ICD-10-CM | POA: Diagnosis not present

## 2024-01-07 DIAGNOSIS — I5022 Chronic systolic (congestive) heart failure: Secondary | ICD-10-CM | POA: Diagnosis not present

## 2024-01-07 LAB — CBC
HCT: 27.5 % — ABNORMAL LOW (ref 39.0–52.0)
HCT: 31.3 % — ABNORMAL LOW (ref 39.0–52.0)
Hemoglobin: 8.6 g/dL — ABNORMAL LOW (ref 13.0–17.0)
Hemoglobin: 10.3 g/dL — ABNORMAL LOW (ref 13.0–17.0)
MCH: 28.8 pg (ref 26.0–34.0)
MCH: 29.8 pg (ref 26.0–34.0)
MCHC: 31.3 g/dL (ref 30.0–36.0)
MCHC: 32.9 g/dL (ref 30.0–36.0)
MCV: 92 fL (ref 80.0–100.0)
MCV: 90.5 fL (ref 80.0–100.0)
Platelets: 173 10*3/uL (ref 150–400)
Platelets: 175 10*3/uL (ref 150–400)
RBC: 2.99 MIL/uL — ABNORMAL LOW (ref 4.22–5.81)
RBC: 3.46 MIL/uL — ABNORMAL LOW (ref 4.22–5.81)
RDW: 13.9 % (ref 11.5–15.5)
RDW: 14 % (ref 11.5–15.5)
WBC: 13.2 10*3/uL — ABNORMAL HIGH (ref 4.0–10.5)
WBC: 11.3 10*3/uL — ABNORMAL HIGH (ref 4.0–10.5)
nRBC: 0 % (ref 0.0–0.2)
nRBC: 0 % (ref 0.0–0.2)

## 2024-01-07 LAB — LIPID PANEL
Cholesterol: 170 mg/dL (ref 0–200)
HDL: 26 mg/dL — ABNORMAL LOW (ref >40)
LDL Cholesterol: 112 mg/dL — ABNORMAL HIGH (ref 0–99)
Total CHOL/HDL Ratio: 6.5 ratio
Triglycerides: 159 mg/dL — ABNORMAL HIGH (ref <150)
VLDL: 32 mg/dL (ref 0–40)

## 2024-01-07 LAB — BASIC METABOLIC PANEL
Anion gap: 9 (ref 5–15)
BUN: 38 mg/dL — ABNORMAL HIGH (ref 8–23)
CO2: 21 mmol/L — ABNORMAL LOW (ref 22–32)
Calcium: 7.9 mg/dL — ABNORMAL LOW (ref 8.9–10.3)
Chloride: 111 mmol/L (ref 98–111)
Creatinine, Ser: 1.98 mg/dL — ABNORMAL HIGH (ref 0.61–1.24)
GFR, Estimated: 34 mL/min — ABNORMAL LOW (ref >60)
Glucose, Bld: 88 mg/dL (ref 70–99)
Potassium: 3.8 mmol/L (ref 3.5–5.1)
Sodium: 141 mmol/L (ref 135–145)

## 2024-01-07 LAB — DIC (DISSEMINATED INTRAVASCULAR COAGULATION)PANEL
D-Dimer, Quant: 0.61 ug{FEU}/mL — ABNORMAL HIGH (ref 0.00–0.50)
Fibrinogen: >800 mg/dL — ABNORMAL HIGH (ref 210–475)
INR: 1.3 — ABNORMAL HIGH (ref 0.8–1.2)
Platelets: 162 10*3/uL (ref 150–400)
Prothrombin Time: 16.4 s — ABNORMAL HIGH (ref 11.4–15.2)
Smear Review: NONE SEEN
aPTT: 72 s — ABNORMAL HIGH (ref 24–36)

## 2024-01-07 LAB — GLUCOSE, CAPILLARY
Glucose-Capillary: 108 mg/dL — ABNORMAL HIGH (ref 70–99)
Glucose-Capillary: 110 mg/dL — ABNORMAL HIGH (ref 70–99)
Glucose-Capillary: 137 mg/dL — ABNORMAL HIGH (ref 70–99)
Glucose-Capillary: 169 mg/dL — ABNORMAL HIGH (ref 70–99)
Glucose-Capillary: 79 mg/dL (ref 70–99)
Glucose-Capillary: 90 mg/dL (ref 70–99)

## 2024-01-07 LAB — HEMOGLOBIN AND HEMATOCRIT, BLOOD
HCT: 32.2 % — ABNORMAL LOW (ref 39.0–52.0)
HCT: 33.2 % — ABNORMAL LOW (ref 39.0–52.0)
Hemoglobin: 10.5 g/dL — ABNORMAL LOW (ref 13.0–17.0)
Hemoglobin: 10.7 g/dL — ABNORMAL LOW (ref 13.0–17.0)

## 2024-01-07 LAB — HEPARIN LEVEL (UNFRACTIONATED)
Heparin Unfractionated: 0.16 [IU]/mL — ABNORMAL LOW (ref 0.30–0.70)
Heparin Unfractionated: 0.26 [IU]/mL — ABNORMAL LOW (ref 0.30–0.70)

## 2024-01-07 MED ORDER — POTASSIUM CHLORIDE 20 MEQ PO PACK
40.0000 meq | PACK | Freq: Once | ORAL | Status: AC
  Administered 2024-01-07: 40 meq via ORAL
  Filled 2024-01-07: qty 2

## 2024-01-07 MED ORDER — AMIODARONE HCL IN DEXTROSE 360-4.14 MG/200ML-% IV SOLN
60.0000 mg/h | INTRAVENOUS | Status: AC
  Administered 2024-01-07: 60 mg/h via INTRAVENOUS
  Filled 2024-01-07: qty 200

## 2024-01-07 MED ORDER — AMIODARONE HCL IN DEXTROSE 360-4.14 MG/200ML-% IV SOLN
60.0000 mg/h | INTRAVENOUS | Status: DC
  Administered 2024-01-07: 30 mg/h via INTRAVENOUS
  Administered 2024-01-08 – 2024-01-09 (×4): 60 mg/h via INTRAVENOUS
  Filled 2024-01-07: qty 400
  Filled 2024-01-07: qty 200
  Filled 2024-01-07: qty 400
  Filled 2024-01-07 (×2): qty 200

## 2024-01-07 MED ORDER — MAGNESIUM SULFATE 2 GM/50ML IV SOLN
2.0000 g | Freq: Once | INTRAVENOUS | Status: AC
  Administered 2024-01-07: 2 g via INTRAVENOUS
  Filled 2024-01-07: qty 50

## 2024-01-07 MED ORDER — AMIODARONE HCL IN DEXTROSE 360-4.14 MG/200ML-% IV SOLN
INTRAVENOUS | Status: AC
  Administered 2024-01-07: 60 mg/h via INTRAVENOUS
  Filled 2024-01-07: qty 200

## 2024-01-07 MED ORDER — FUROSEMIDE 10 MG/ML IJ SOLN
40.0000 mg | Freq: Once | INTRAMUSCULAR | Status: AC
  Administered 2024-01-07: 40 mg via INTRAVENOUS
  Filled 2024-01-07: qty 4

## 2024-01-07 MED ORDER — AMIODARONE LOAD VIA INFUSION
150.0000 mg | Freq: Once | INTRAVENOUS | Status: AC
  Administered 2024-01-07: 150 mg via INTRAVENOUS
  Filled 2024-01-07: qty 83.34

## 2024-01-07 NOTE — Progress Notes (Signed)
 [address]       [place] [address]             [phone]      3 Days Post-Op  Procedure(s) (LRB): CORONARY ARTERY BYPASS GRAFTING TIMES THREE USING LEFT INTERNAL MAMMARY ARTERY AND RIGHT GREAT SAPHENOUS VEIN HARVESTED ENDOSCOPICALLY (N/A) ECHOCARDIOGRAM, TRANSESOPHAGEAL, INTRAOPERATIVE (N/A)   Total Length of Stay:  LOS: 3 days    SUBJECTIVE: More awake Went into afib this am Had drop in H/H overnight which may have been sprurious Vitals:   01/07/24 0645 01/07/24 0700  BP: 135/71 (!) 140/73  Pulse: (!) 116 (!) 103  Resp: 15 15  Temp:    SpO2: 95% 95%    Intake/Output      03/22 0701 03/23 0700 03/23 0701 03/24 0700   P.O. 0    I.V. (mL/kg) 118.2 (1.3)    IV Piggyback 149.9    Total Intake(mL/kg) 268.1 (3)    Urine (mL/kg/hr) 1350 (0.6)    Chest Tube 50    Total Output 1400    Net -1131.9             albumin human     heparin Stopped (01/07/24 0221)   magnesium sulfate bolus IVPB 2 g (01/07/24 0804)    CBC    Component Value Date/Time   WBC 13.2 (H) 01/07/2024 0027   RBC 2.99 (L) 01/07/2024 0027   HGB 10.5 (L) 01/07/2024 0208   HGB 14.3 12/11/2023 1138   HCT 32.2 (L) 01/07/2024 0208   HCT 44.1 12/11/2023 1138   PLT 162 01/07/2024 0208   PLT 247 12/11/2023 1138   MCV 92.0 01/07/2024 0027   MCV 90 12/11/2023 1138   MCV 90 09/16/2014 0831   MCH 28.8 01/07/2024 0027   MCHC 31.3 01/07/2024 0027   RDW 13.9 01/07/2024 0027   RDW 12.1 12/11/2023 1138   RDW 12.7 09/16/2014 0831   CMP     Component Value Date/Time   NA 140 01/06/2024 0517   NA 141 12/11/2023 1138   NA 137 09/16/2014 0831   K 3.6 01/06/2024 0517   K 3.9 09/16/2014 0831   CL 109 01/06/2024 0517   CL 101 09/16/2014 0831   CO2 23 01/06/2024 0517   CO2 27 09/16/2014 0831   GLUCOSE 109 (H) 01/06/2024 0517   GLUCOSE 100 (H) 09/16/2014 0831   BUN 35 (H) 01/06/2024 0517   BUN 38 (H) 12/11/2023 1138   BUN 22 (H) 09/16/2014 0831   CREATININE 2.41 (H)  01/06/2024 0517   CREATININE 1.35 (H) 09/16/2014 0831   CALCIUM 7.7 (L) 01/06/2024 0517   CALCIUM 10.3 (H) 09/16/2014 0831   PROT 6.7 01/02/2024 1330   PROT 6.5 12/11/2023 1138   ALBUMIN 3.3 (L) 01/02/2024 1330   ALBUMIN 4.0 12/11/2023 1138   AST 18 01/02/2024 1330   ALT 16 01/02/2024 1330   ALKPHOS 55 01/02/2024 1330   BILITOT 0.9 01/02/2024 1330   BILITOT 0.7 12/11/2023 1138   GFRNONAA 27 (L) 01/06/2024 0517   GFRNONAA 56 (L) 09/16/2014 0831   GFRAA 50 (L) 10/17/2015 0518   GFRAA >60 09/16/2014 0831   ABG    Component Value Date/Time   PHART 7.306 (L) 01/04/2024 2032   PCO2ART 34.8 01/04/2024 2032   PO2ART 71 (L) 01/04/2024 2032   HCO3 17.3 (L) 01/04/2024 2032   TCO2 18 (L) 01/04/2024 2032   ACIDBASEDEF 8.0 (H) 01/04/2024 2032   O2SAT 75.7 01/05/2024 0830  CBG (last 3)  Recent Labs    01/06/24 2028 01/06/24 2356 01/07/24 0346  GLUCAP 92 94 90  EXAM Lungs: decreased bases Card: IRR  Ext: warm Neuro: upper arm movement better. Still nothing R leg, conversant   ASSESSMENT: SP CABG complicated by CVA and Afib Continue IV BB and will add IV amiodarone and observe rhythm for pauses Restart Heparin and follow HCT Needs NGT or cortrack today for GI Access Modified swallow prior to taking po   Eugenio Hoes, MD 01/07/2024

## 2024-01-07 NOTE — Progress Notes (Addendum)
 STROKE TEAM PROGRESS NOTE   INTERIM HISTORY/SUBJECTIVE  Wife is at the bedside.  Patient is awake alert sitting up in the bed in no apparent distress.  Per wife he had his surgery on Thursday and on Friday they noticed left-sided weakness and confusion.  Patient has developed postop A-fib and is currently on amiodarone drip MRI brain with multiple acute/subacute infarcts in bilateral hemispheres MRA of the brain shows focal right terminal ICA and right P2 stenosis. OBJECTIVE  CBC    Component Value Date/Time   WBC 13.2 (H) 01/07/2024 0027   RBC 2.99 (L) 01/07/2024 0027   HGB 10.7 (L) 01/07/2024 0819   HGB 14.3 12/11/2023 1138   HCT 33.2 (L) 01/07/2024 0819   HCT 44.1 12/11/2023 1138   PLT 162 01/07/2024 0208   PLT 247 12/11/2023 1138   MCV 92.0 01/07/2024 0027   MCV 90 12/11/2023 1138   MCV 90 09/16/2014 0831   MCH 28.8 01/07/2024 0027   MCHC 31.3 01/07/2024 0027   RDW 13.9 01/07/2024 0027   RDW 12.1 12/11/2023 1138   RDW 12.7 09/16/2014 0831    BMET    Component Value Date/Time   NA 141 01/07/2024 0819   NA 141 12/11/2023 1138   NA 137 09/16/2014 0831   K 3.8 01/07/2024 0819   K 3.9 09/16/2014 0831   CL 111 01/07/2024 0819   CL 101 09/16/2014 0831   CO2 21 (L) 01/07/2024 0819   CO2 27 09/16/2014 0831   GLUCOSE 88 01/07/2024 0819   GLUCOSE 100 (H) 09/16/2014 0831   BUN 38 (H) 01/07/2024 0819   BUN 38 (H) 12/11/2023 1138   BUN 22 (H) 09/16/2014 0831   CREATININE 1.98 (H) 01/07/2024 0819   CREATININE 1.35 (H) 09/16/2014 0831   CALCIUM 7.9 (L) 01/07/2024 0819   CALCIUM 10.3 (H) 09/16/2014 0831   EGFR 39 (L) 12/11/2023 1138   GFRNONAA 34 (L) 01/07/2024 0819   GFRNONAA 56 (L) 09/16/2014 0831    IMAGING past 24 hours DG Swallowing Func-Speech Pathology Result Date: 01/07/2024 Table formatting from the original result was not included. Modified Barium Swallow Study Patient Details Name: Tyler David MRN: [id number] Date of Birth: 1946-09-10 Today's Date: 01/07/2024  HPI/PMH: HPI: Patient is a 78 y.o. male with PMH: GERD, HLD, HTN, CKD stage III, arthritis, prostate cancer. He was recently diagnosed with CHF and placed on diuretics. He underwent CABG x3 on 01/05/24 and extubated post-surgery to BiPAP and progressed to oxygen via [place]. Following surgery, he was noted to have right leg weakness with intact sensation. 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 MRI brain pending. He failed Yale swallow with RN on 3/22 and was kept NPO awaiting SLP swallow evaluation. Clinical Impression: Pt exhibits mild oropharyngeal dysphagia characterized by deficits primarily related to timing. He achieves timely and complete oral clearance. Suspected cervical osteophytes prohibit complete epiglottic inversion and base of tongue retraction is often incomplete, leading to vallecular residue most notably with solids and the barium tablet. Thin liquids consistently result in trace, transient penetration (PAS 2) which is considered WFL. There was one outlying incident of trace aspiration with thin liquids, which is considered to be mild in nature per the DIGEST. Pt required multiple sips of thin liquids to clear the 13 mm barium tablet from his oral cavity and then bites of puree to clear it from his valleculae. Even in this period of prolonged bolus manipulation, pt maintained complete laryngeal elevation, demonstrating excellent airway protection. Suspect most significant risk is  secondary to large, uncontrolled sips and bites which may be prohibited if pt is given full supervision. Consider giving larger pills whole with puree.  Recommend diet of regular solids with thin liquids. SLP will f/u at least briefly. Given acute stroke, consider adding orders for a cognitive-linguistic evaluation. DIGEST Swallow Severity Rating*  Safety: 1  Efficiency: 1  Overall Pharyngeal Swallow Severity: 1 (mild) 1: mild; 2: moderate; 3: severe; 4: profound *The Dynamic Imaging Grade of Swallowing Toxicity is  standardized for the head and neck cancer population, however, demonstrates promising clinical applications across populations to standardize the clinical rating of pharyngeal swallow safety and severity. Factors that may increase risk of adverse event in presence of aspiration Rubye Oaks & Clearance Coots 2021): Factors that may increase risk of adverse event in presence of aspiration Rubye Oaks & Clearance Coots 2021): Limited mobility Recommendations/Plan: Swallowing Evaluation Recommendations Swallowing Evaluation Recommendations Recommendations: PO diet PO Diet Recommendation: Regular; Thin liquids (Level 0) Liquid Administration via: Cup; Straw Medication Administration: Whole meds with puree Supervision: Staff to assist with self-feeding; Full supervision/cueing for swallowing strategies Swallowing strategies  : Minimize environmental distractions; Slow rate; Small bites/sips; Multiple dry swallows after each bite/sip Postural changes: Position pt fully upright for meals Oral care recommendations: Oral care BID (2x/day) Treatment Plan Treatment Plan Treatment recommendations: Therapy as outlined in treatment plan below Follow-up recommendations: Acute inpatient rehab (3 hours/day) Functional status assessment: Patient has had a recent decline in their functional status and demonstrates the ability to make significant improvements in function in a reasonable and predictable amount of time. Treatment frequency: Min 2x/week Treatment duration: 1 week Interventions: Aspiration precaution training; Patient/family education; Trials of upgraded texture/liquids; Diet toleration management by SLP Recommendations Recommendations for follow up therapy are one component of a multi-disciplinary discharge planning process, led by the attending physician.  Recommendations may be updated based on patient status, additional functional criteria and insurance authorization. Assessment: Orofacial Exam: Orofacial Exam Oral Cavity: Oral Hygiene: WFL  Oral Cavity - Dentition: Adequate natural dentition Orofacial Anatomy: WFL Oral Motor/Sensory Function: WFL Anatomy: Anatomy: Suspected cervical osteophytes Boluses Administered: Boluses Administered Boluses Administered: Thin liquids (Level 0); Mildly thick liquids (Level 2, nectar thick); Moderately thick liquids (Level 3, honey thick); Puree; Solid  Oral Impairment Domain: Oral Impairment Domain Lip Closure: No labial escape Tongue control during bolus hold: Posterior escape of less than half of bolus Bolus preparation/mastication: Timely and efficient chewing and mashing Bolus transport/lingual motion: Brisk tongue motion Oral residue: Complete oral clearance Location of oral residue : N/A Initiation of pharyngeal swallow : Pyriform sinuses  Pharyngeal Impairment Domain: Pharyngeal Impairment Domain Soft palate elevation: No bolus between soft palate (SP)/pharyngeal wall (PW) Laryngeal elevation: Complete superior movement of thyroid cartilage with complete approximation of arytenoids to epiglottic petiole Anterior hyoid excursion: Complete anterior movement Epiglottic movement: Complete inversion Laryngeal vestibule closure: Complete, no air/contrast in laryngeal vestibule Pharyngeal stripping wave : Present - complete Pharyngeal contraction (A/P view only): N/A Pharyngoesophageal segment opening: Complete distension and complete duration, no obstruction of flow Tongue base retraction: Trace column of contrast or air between tongue base and PPW Pharyngeal residue: Collection of residue within or on pharyngeal structures Location of pharyngeal residue: Tongue base; Valleculae  Esophageal Impairment Domain: Esophageal Impairment Domain Esophageal clearance upright position: Complete clearance, esophageal coating Pill: Pill Consistency administered: Thin liquids (Level 0) Thin liquids (Level 0): Impaired (see clinical impressions) Penetration/Aspiration Scale Score: No data recorded Compensatory Strategies:  Compensatory Strategies Compensatory strategies: Yes Straw: Effective Effective Straw: Thin liquid (Level 0); Mildly  thick liquid (Level 2, nectar thick) Multiple swallows: Effective Effective Multiple Swallows: Puree; Solid; Pill   General Information: Caregiver present: Yes  Diet Prior to this Study: NPO   Temperature : Normal   Respiratory Status: WFL   Supplemental O2: Nasal cannula   History of Recent Intubation: Yes  Behavior/Cognition: Alert; Cooperative; Requires cueing Self-Feeding Abilities: Needs assist with self-feeding Baseline vocal quality/speech: Normal Volitional Cough: Able to elicit Volitional Swallow: Able to elicit Exam Limitations: No limitations Goal Planning: Prognosis for improved oropharyngeal function: Good Barriers to Reach Goals: Cognitive deficits; Time post onset No data recorded Patient/Family Stated Goal: no family present, patient did not state Consulted and agree with results and recommendations: Patient; Nurse Pain: Pain Assessment Pain Assessment: Faces Pain Score: 0 Faces Pain Scale: 0 Pain Location: generalized with movement Pain Descriptors / Indicators: Grimacing; Discomfort Pain Intervention(s): Monitored during session End of Session: Start Time:SLP Start Time (ACUTE ONLY): 1050 Stop Time: SLP Stop Time (ACUTE ONLY): 1111 Time Calculation:SLP Time Calculation (min) (ACUTE ONLY): 21 min Charges: SLP Evaluations $ SLP Speech Visit: 1 Visit SLP Evaluations $BSS Swallow: 1 Procedure $MBS Swallow: 1 Procedure $Swallowing Treatment: 1 Procedure SLP visit diagnosis: SLP Visit Diagnosis: Dysphagia, oropharyngeal phase (R13.12) Past Medical History: Past Medical History: Diagnosis Date  Arthritis   Cancer (HCC)   prostate cancer   Cardiomyopathy (HCC)   Chronic kidney disease, stage 3 (HCC)   Coronary artery disease   ED (erectile dysfunction)   GERD (gastroesophageal reflux disease)   Headache   hx of migraine-2015   Hyperlipidemia   Hypertension   Hypothyroidism   LBBB (left  bundle branch block)   Migraine   Nocturia   Overactive bladder   Pneumonia   hx of at age 74  Past Surgical History: Past Surgical History: Procedure Laterality Date  CARPAL TUNNEL RELEASE Bilateral   CORONARY ARTERY BYPASS GRAFT N/A 01/04/2024  Procedure: CORONARY ARTERY BYPASS GRAFTING TIMES THREE USING LEFT INTERNAL MAMMARY ARTERY AND RIGHT GREAT SAPHENOUS VEIN HARVESTED ENDOSCOPICALLY;  Surgeon: Corliss Skains, MD;  Location: [facility] OR;  Service: Open Heart Surgery;  Laterality: N/A;  INTRAOPERATIVE TRANSESOPHAGEAL ECHOCARDIOGRAM N/A 01/04/2024  Procedure: ECHOCARDIOGRAM, TRANSESOPHAGEAL, INTRAOPERATIVE;  Surgeon: Corliss Skains, MD;  Location: [facility] OR;  Service: Open Heart Surgery;  Laterality: N/A;  PROSTATE SURGERY    RIGHT/LEFT HEART CATH AND CORONARY ANGIOGRAPHY Bilateral 12/15/2023  Procedure: RIGHT/LEFT HEART CATH AND CORONARY ANGIOGRAPHY;  Surgeon: Yvonne Kendall, MD;  Location: [facility] INVASIVE CV LAB;  Service: Cardiovascular;  Laterality: Bilateral;  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  Procedure: LEFT TOTAL HIP ARTHROPLASTY ANTERIOR APPROACH;  Surgeon: Durene Romans, MD;  Location: [facility] ORS;  Service: Orthopedics;  Laterality: Left; Gwynneth Aliment, M.A., CF-SLP Speech Language Pathology, Acute Rehabilitation Services Secure Chat preferred [phone] 01/07/2024, 11:38 AM  DG CHEST PORT 1 VIEW Result Date: 01/07/2024 CLINICAL DATA:  Hypoxemia EXAM: PORTABLE CHEST 1 VIEW COMPARISON:  01/06/2024 FINDINGS: Cardiac shadow is enlarged. Postsurgical changes are again seen. Right jugular central line is noted. Lungs are well aerated bilaterally. No pneumothorax is seen. Mild bibasilar atelectasis is noted. IMPRESSION: Mild bibasilar atelectasis. No other focal abnormality is noted. Electronically Signed   By: Alcide Clever M.D.   On: 01/07/2024 02:24   MR ANGIO HEAD WO CONTRAST Result Date: 01/06/2024 CLINICAL DATA:  Follow-up examination for stroke. EXAM: MRA HEAD WITHOUT CONTRAST TECHNIQUE:  Angiographic images of the Circle of Willis were acquired using MRA technique without intravenous contrast. COMPARISON:  MRI from earlier the same day. FINDINGS: Examination  degraded by motion artifact. Anterior circulation: Left ICA widely patent to the siphon without stenosis. Focal moderate to severe stenosis noted at the supraclinoid right ICA just prior to the terminus (series 2, image 94). Superimposed tiny 2 mm outpouching at this level could reflect a small aneurysm (series 2, image 95). A1 segments patent bilaterally. Normal anterior communicating artery complex. Anterior cerebral arteries patent without significant stenosis. No M1 stenosis or occlusion. Distal MCA branches perfused and symmetric. Posterior circulation: Both V4 segments patent without stenosis. Right vertebral artery slightly dominant. Both PICA patent at their origins. Basilar patent without stenosis. Superior cerebral arteries patent bilaterally. Both PCAs primarily supplied via the basilar. Focal severe stenosis at the left P1/P2 junction (series 2, image 104). Focal moderate to severe right P2 stenosis (series 251, image 15). PCAs otherwise patent to their distal aspects. Anatomic variants: None significant. Other: None. IMPRESSION: 1. Negative intracranial MRA for large vessel occlusion. 2. Focal moderate to severe stenosis at the supraclinoid right ICA just prior to the terminus. Superimposed tiny 2 mm outpouching at this level could reflect a small aneurysm. 3. Focal severe left P1/P2 junction stenosis, with additional moderate to severe right P2 stenosis. Electronically Signed   By: Rise Mu M.D.   On: 01/06/2024 23:42   MR BRAIN WO CONTRAST Result Date: 01/06/2024 CLINICAL DATA:  Neuro deficit, acute, stroke suspected. CABG 2 days ago. Right leg weakness. Abnormal CT of the head. EXAM: MRI HEAD WITHOUT CONTRAST TECHNIQUE: Multiplanar, multiecho pulse sequences of the brain and surrounding structures were obtained  without intravenous contrast. COMPARISON:  CT head without contrast 01/05/2024. FINDINGS: Brain: Diffusion-weighted images confirm the acute/subacute infarcts in the left ACA distribution. In addition posteromedial parietal lobe infarcts are present bilaterally, left greater than right. Diffuse punctate foci of acute nonhemorrhagic infarction are present across the ACA/MCA watershed territories bilaterally. Bilateral cerebellar infarcts are more prominent on the left. The largest measures 10 mm. T2 and FLAIR hyperintensities are associated with the areas of restricted diffusion, consistent with the subacute time frame. No associated hemorrhage is present. Mild periventricular T2 hyperintensities are present otherwise. Deep brain nuclei are within normal limits. The ventricles are of normal size. The brainstem and cerebellum are otherwise within normal limits. No significant extraaxial fluid collection is present. Vascular: Flow is present in the major intracranial arteries. Skull and upper cervical spine: The craniocervical junction is normal. Upper cervical spine is within normal limits. Marrow signal is unremarkable. Sinuses/Orbits: The paranasal sinuses and mastoid air cells are clear. The globes and orbits are within normal limits. Other: IMPRESSION: 1. Multiple foci of acute/subacute nonhemorrhagic infarction is present throughout both cerebral hemispheres and left greater than right cerebellum. 2. Acute/subacute infarcts in the left ACA distribution. 3. Additional posteromedial parietal lobe infarcts bilaterally, left greater than right. 4. Diffuse punctate foci of acute nonhemorrhagic infarction across the ACA/MCA watershed territories bilaterally. 5. Bilateral cerebellar infarcts are more prominent on the left. 6. The constellation of findings suggest a central embolic source. 7. Mild periventricular T2 hyperintensities bilaterally likely reflect the sequela of chronic microvascular ischemia. Electronically  Signed   By: Marin Roberts M.D.   On: 01/06/2024 19:36    Vitals:   01/07/24 1445 01/07/24 1500 01/07/24 1515 01/07/24 1530  BP: (!) 118/54 (!) 140/62 (!) 155/71   Pulse: 61 66 71   Resp: 15 17 20    Temp:    97.8 F (36.6 C)  TempSrc:    Axillary  SpO2: 93% 93% 94%   Weight:  Height:         PHYSICAL EXAM General: Pleasant elderly Caucasian male.  Psych:  Mood and affect appropriate for situation CV: Regular rate and rhythm on monitor Respiratory:  Regular, unlabored respirations on room air GI: Abdomen soft and nontender   NEURO:  Mental Status: He is awake alert with some confusion able to state his name, not able to comment on place, has trouble following some commands.  Speech is dysarthric and slow  Cranial Nerves:  II: PERRL. Visual fields full.  III, IV, VI: Eyes are midline, does not really follow commands completely so unable to test for movements where he is not tracking V: Sensation is intact to light touch and symmetrical to face.  VII: Left facial droop VIII: hearing intact to voice. IX, X: Palate elevates symmetrically. Phonation is normal.  KG:[id number] shrug 5/5. XII: tongue is midline without fasciculations. Motor: Left arm weakness with drift 3/5, left leg he is able to lift off bed 4/5, right arm 4/5, right leg 1/5 and unable to lift off the bed Tone: is normal and bulk is normal Sensation- Intact to light touch bilaterally. Extinction absent to light touch to DSS.   Coordination: FTN intact bilaterally, HKS: no ataxia in BLE.No drift.  Gait- deferred  Most Recent NIH  1a Level of Conscious.: 0 1b LOC Questions: 0 1c LOC Commands: 1 2 Best Gaze: 0 3 Visual: 0 4 Facial Palsy: 1 5a Motor Arm - left: 2 5b Motor Arm - Right: 1 6a Motor Leg - Left: 2 6b Motor Leg - Right: 3 7 Limb Ataxia: 0 8 Sensory: 0 9 Best Language: 1 10 Dysarthria: 1 11 Extinct. and Inatten.: 0 TOTAL: 12   ASSESSMENT/PLAN  Tyler David is a 78 y.o.  male with history of HFrEF (ejection fraction of 35%), PVCs, LBBB, CAD, CKD IIIb, HTN, HLD, prior history of prostate cancer who is status post CABG x 3 on 3/20 followed by admission to the ICU for postoperative care. Yesterday, the patient was noted to have RLE weakness.     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  Etiology: Perioperative post CABG versus new onset A-fib MRI acute/subacute infarcts in bilateral hemispheres, left ACA, parietal lobe bilaterally ACA MCA MRA no LVO Carotid Doppler ordered 2D Echo EF 40% with hypokinesis LDL 112 HgbA1c 5.4 VTE prophylaxis -heparin IV No antithrombotic prior to admission, now on heparin IV will need to transition to Eliquis when patient is able and appropriate Therapy recommendations:  Pending Disposition: Pending  Atrial fibrillation with RVR, new onset post CABG Home Meds: None Continue telemetry monitoring Currently he is on IV heparin and Amio drip Transition to Eliquis when patient is able and appropriate and cardiology is okay with it  Hypertension CHF CAD s/p CABG Home meds: Amlodipine 5 mg, metoprolol 50 mg, Entresto Stable Blood Pressure Goal: SBP less than 160   Hyperlipidemia Home meds: None,  LDL 112, goal < 70 On Zetia 10 mg Has allergies to statins Consider LEQVIO  Dysphagia Patient has post-stroke dysphagia, SLP consulted    Diet   Diet regular Room service appropriate? Yes with Assist; Fluid consistency: Thin   Advance diet as tolerated  Other Stroke Risk Factors Coronary artery disease Congestive heart failure    Other Active Problems CKD Hypothyroidism  Hospital day # 3   Gevena Mart DNP, ACNPC-AG  [facility]  I have personally obtained history,examined this patient, reviewed notes, independently viewed imaging studies, participated in  medical decision making and plan of care.ROS completed by me personally and pertinent positives fully  documented  I have made any additions or clarifications directly to the above note. Agree with note above.  Patient developed confusion and weakness postcardiac surgery and also went into new onset A-fib and MRI scan shows bilateral anterior and posterior circulation embolic infarcts.  Continue IV heparin and switch to Eliquis when patient is able to swallow safely and when okay with cardiothoracic surgery.  Continue ongoing stroke workup.  Check carotid ultrasound.  Aggressive risk factor modification.  Patient is allergic to statins consider Leqvio or Repatha/Praluent as outpatient.  Mobilize out of bed.  Physical occupational speech therapy consults.  Long discussion patient and wife at the bedside and answered questions.  Greater than 50% time during this 50-minute visit was spent in counseling and coordination of care and discussion patient and wife and care team and answering questions.  Discussed with Dr. Karie Fetch critical care medicine.  Delia Heady, MD Medical Director [facility] Pager: [phone] 01/07/2024 5:01 PM   To contact Stroke Continuity provider, please refer to [email or web address]. After hours, contact General Neurology

## 2024-01-07 NOTE — Progress Notes (Signed)
 Speech Language Pathology Treatment: Dysphagia  Patient Details Name: Tyler David MRN: [id number] DOB: 1946-07-20 Today's Date: 01/07/2024 Time: 0820-0840 SLP Time Calculation (min) (ACUTE ONLY): 20 min  Assessment / Plan / Recommendation Clinical Impression  Pt much more alert and participatory this date, although still presents with signs concerning for pharyngeal dysphagia. He consistently uses forceful throat clearance immediately after trials of ice chips, thin liquids, and nectar thick liquids. Pt uses up to 5 swallows per bolus despite the consistency. Will proceed with an MBS. Recommend he remain NPO except priority meds crushed in puree pending results. Discussed with RN and will f/u to complete MBS as scheduling in radiology allows.    HPI HPI: Patient is a 78 y.o. male with PMH: GERD, HLD, HTN, CKD stage III, arthritis, prostate cancer. He was recently diagnosed with CHF and placed on diuretics. He underwent CABG x3 on 01/05/24 and extubated post-surgery to BiPAP and progressed to oxygen via [place]. Following surgery, he was noted to have right leg weakness with intact sensation. 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 MRI brain pending. He failed Yale swallow with RN on 3/22 and was kept NPO awaiting SLP swallow evaluation.      SLP Plan  MBS      Recommendations for follow up therapy are one component of a multi-disciplinary discharge planning process, led by the attending physician.  Recommendations may be updated based on patient status, additional functional criteria and insurance authorization.    Recommendations  Diet recommendations: NPO Medication Administration: Crushed with puree                  Oral care QID;Staff/trained caregiver to provide oral care   Frequent or constant Supervision/Assistance Dysphagia, unspecified (R13.10)     MBS     Gwynneth Aliment, M.A., CF-SLP Speech Language Pathology, Acute Rehabilitation Services  Secure Chat  preferred [phone]  01/07/2024, 9:05 AM

## 2024-01-07 NOTE — Progress Notes (Signed)
 PHARMACY - ANTICOAGULATION CONSULT NOTE  Pharmacy Consult for heparin Indication: atrial fibrillation and stroke  Allergies  Allergen Reactions   Atorvastatin     myalgias  Other Reaction(s): myalgias   Pravachol [Pravastatin]     Muscle aches   Simvastatin    Statins Other (See Comments)    Muscle pain   Penicillins Rash    Has patient had a PCN reaction causing immediate rash, facial/tongue/throat swelling, SOB or lightheadedness with hypotension: No Has patient had a PCN reaction causing severe rash involving mucus membranes or skin necrosis: No Has patient had a PCN reaction that required hospitalization No Has patient had a PCN reaction occurring within the last 10 years: No If all of the above answers are "NO", then may proceed with Cephalosporin use.     Patient Measurements: Height: 5\' 8"  (172.7 cm) Weight: 89 kg (196 lb 3.4 oz) IBW/kg (Calculated) : 68.4 Heparin Dosing Weight: 85kg  Vital Signs: Temp: 97.8 F (36.6 C) (03/23 1530) Temp Source: Axillary (03/23 1530) BP: 102/55 (03/23 1700) Pulse Rate: 59 (03/23 1700)  Labs: Recent Labs    01/05/24 1846 01/06/24 0517 01/07/24 0027 01/07/24 0208 01/07/24 0819 01/07/24 1623  HGB 11.4* 11.0* 8.6* 10.5* 10.7* 10.3*  HCT 34.6* 33.3* 27.5* 32.2* 33.2* 31.3*  PLT 185 167 173 162  --  175  APTT  --   --   --  72*  --   --   LABPROT  --   --   --  16.4*  --   --   INR  --   --   --  1.3*  --   --   HEPARINUNFRC  --   --  0.26*  --   --  0.16*  CREATININE 2.38* 2.41*  --   --  1.98*  --     Estimated Creatinine Clearance: 33.3 mL/min (A) (by C-G formula based on SCr of 1.98 mg/dL (H)).   Medical History: Past Medical History:  Diagnosis Date   Arthritis    Cancer (HCC)    prostate cancer    Cardiomyopathy (HCC)    Chronic kidney disease, stage 3 (HCC)    Coronary artery disease    ED (erectile dysfunction)    GERD (gastroesophageal reflux disease)    Headache    hx of migraine-2015     Hyperlipidemia    Hypertension    Hypothyroidism    LBBB (left bundle branch block)    Migraine    Nocturia    Overactive bladder    Pneumonia    hx of at age 2     Medications:  Medications Prior to Admission  Medication Sig Dispense Refill Last Dose/Taking   amLODipine (NORVASC) 5 MG tablet Take 1 tablet (5 mg total) by mouth daily. 90 tablet 3 01/03/2024 at  6:30 PM   Cholecalciferol (VITAMIN D-3) 25 MCG (1000 UT) CAPS Take 1,000 Units by mouth daily.   Past Week   famotidine (PEPCID) 20 MG tablet Take 20 mg by mouth daily.   01/03/2024 at  6:30 PM   furosemide (LASIX) 20 MG tablet Take 1 tablet (20 mg total) by mouth daily as needed (swelling or weight gain (2 pounds in 24 hours or 5 pounds in 1 week)).   Past Week   levothyroxine (SYNTHROID) 50 MCG tablet Take 50 mcg by mouth daily before breakfast.   01/04/2024 at  4:15 AM   MAGNESIUM PO Take 420 mg by mouth daily.   Past Week  metoprolol succinate (TOPROL-XL) 50 MG 24 hr tablet Take 50 mg by mouth at bedtime. Take with or immediately following a meal.   01/03/2024 at  6:30 PM   Multiple Vitamins-Minerals (ICAPS AREDS 2 PO) Take 1 capsule by mouth 2 (two) times daily.   Past Week   nitroGLYCERIN (NITROSTAT) 0.4 MG SL tablet Place 1 tablet (0.4 mg total) under the tongue every 5 (five) minutes as needed for chest pain. 25 tablet PRN Taking As Needed   Omega-3 Fatty Acids (FISH OIL) 1000 MG CAPS Take 1,000 mg by mouth daily.   Past Week   sacubitril-valsartan (ENTRESTO) 49-51 MG Take 1 tablet by mouth 2 (two) times daily. 180 tablet 3 12/31/2023   Turmeric 500 MG CAPS Take 500 mg by mouth 2 (two) times daily.   Past Week   Scheduled:   acetaminophen  1,000 mg Oral Q6H   Or   acetaminophen (TYLENOL) oral liquid 160 mg/5 mL  1,000 mg Per Tube Q6H   amLODipine  5 mg Oral Daily   aspirin EC  325 mg Oral Daily   Or   aspirin  324 mg Per Tube Daily   bisacodyl  10 mg Oral Daily   Or   bisacodyl  10 mg Rectal Daily   Chlorhexidine  Gluconate Cloth  6 each Topical Daily   docusate sodium  200 mg Oral Daily   ezetimibe  10 mg Oral Daily   insulin aspart  0-24 Units Subcutaneous Q4H   levothyroxine  50 mcg Oral QAC breakfast   metoprolol tartrate  12.5 mg Oral BID   Or   metoprolol tartrate  12.5 mg Per Tube BID   pantoprazole (PROTONIX) IV  40 mg Intravenous QHS   Infusions:   albumin human     amiodarone 30 mg/hr (01/07/24 1628)   heparin 1,200 Units/hr (01/07/24 1600)    Assessment: Pt s/p CABG x3. Now with afib and CVA. Heparin started last night per neurology recommendations.  Several hours later patient had bleeding at chest tube site and pink urine.  Hgb dropped to 8.6.  Heparin was held over night.  Hgb rechecked 2 hrs later back up to 10.5.    Discussed with Dr. Leafy Ro this AM - okay to resume heparin with reduced goal level for CVA/bleeding.  Heparin level came back at 0.16 this PM. No bleeding issue per Rn. We will increase and check another level in 8 hrs.  Goal of Therapy:  Heparin level 0.3-0.5 units/ml Monitor platelets by anticoagulation protocol: Yes   Plan:  Increase IV heparin to 1350 units/hr Check heparin level in 8 hrs. Daily heparin level and CBC.  Ulyses Southward, PharmD, BCIDP, AAHIVP, CPP Infectious Disease Pharmacist 01/07/2024 5:29 PM

## 2024-01-07 NOTE — Progress Notes (Addendum)
 eLink Physician-Brief Progress Note Patient Name: Tyler David DOB: Apr 30, 1946 MRN: [id number]   Date of Service  01/07/2024  HPI/Events of Note  Notified that the patient is having pink urine and a hemoglobin drop of 2.4 g.  No obvious hemodynamic changes.  While the patient was in MRI, concerned that the chest tube may have been displaced.  eICU Interventions  Trend hemoglobin, type and screen with morning labs.  Will obtain chest radiograph.  Bedside alert TCTS as they are primary.   4098 -cardiac monitor looks different than prior.  EKG most consistent with likely afib with PACs and left bundle branch.  Overall, no immediate impact on care, rate controlled   Intervention Category Intermediate Interventions: Bleeding - evaluation and treatment with blood products  Acelyn Basham 01/07/2024, 1:59 AM

## 2024-01-07 NOTE — Plan of Care (Signed)
  Problem: Clinical Measurements: Goal: Respiratory complications will improve Outcome: Progressing   Problem: Pain Managment: Goal: General experience of comfort will improve and/or be controlled Outcome: Progressing   Problem: Skin Integrity: Goal: Risk for impaired skin integrity will decrease Outcome: Progressing   Problem: Skin Integrity: Goal: Wound healing without signs and symptoms of infection Outcome: Progressing   Problem: Tissue Perfusion: Goal: Adequacy of tissue perfusion will improve Outcome: Progressing   Problem: Clinical Measurements: Goal: Ability to maintain clinical measurements within normal limits will improve Outcome: Not Progressing

## 2024-01-07 NOTE — Progress Notes (Signed)
 NAME:  Tyler David, MRN:  [id number], DOB:  11-19-1945, LOS: 3 ADMISSION DATE:  01/04/2024, CONSULTATION DATE:  01/04/2024 REFERRING MD: Dr. Cliffton Asters, CHIEF COMPLAINT: Status post CABG  History of Present Illness:  78 year old male with CKD stage IIIb, hypertension, hyperlipidemia, prior history of prostate cancer who initially presented with chest pain, shortness of breath and x-ray chest was suggestive of pulmonary edema.  He underwent echocardiogram which showed EF 35-40%.  Underwent cardiac cath and was diagnosed with multivessel obstructive coronary artery disease.  Patient underwent CABG x 3 today, remained on dobutamine, intubated and was transferred to ICU.  PCCM was consulted for help evaluation medical management  Pertinent  Medical History   Past Medical History:  Diagnosis Date   Arthritis    Cancer [facility])    prostate cancer    Cardiomyopathy (HCC)    Chronic kidney disease, stage 3 (HCC)    Coronary artery disease    ED (erectile dysfunction)    GERD (gastroesophageal reflux disease)    Headache    hx of migraine-2015    Hyperlipidemia    Hypertension    Hypothyroidism    LBBB (left bundle branch block)    Migraine    Nocturia    Overactive bladder    Pneumonia    hx of at age 58     Significant Hospital Events: Including procedures, antibiotic start and stop dates in addition to other pertinent events   3/20 CABG x 3 3/21 post-op Afib with RVR> amiodarone, then had sinus pauses once converted out 3/22 RLE weakness> L frontal ischemic stroke   Interim History / Subjective:  Denies pain, has not been out of bed much. Went back into Afib overnight when he was sitting up. Rate with Afib has been variable.   Objective   Blood pressure (!) 140/73, pulse (!) 103, temperature 98.6 F (37 C), temperature source Axillary, resp. rate 15, height 5\' 8"  (1.727 m), weight 89 kg, SpO2 95%.        Intake/Output Summary (Last 24 hours) at 01/07/2024 0729 Last data filed  at 01/07/2024 0640 Gross per 24 hour  Intake 268.08 ml  Output 1400 ml  Net -1131.92 ml   Filed Weights   01/05/24 0600 01/06/24 0327 01/07/24 0500  Weight: 90.4 kg 90.3 kg 89 kg    Examination: General: Elderly man lying in bed no acute distress, sleeping but arouses to verbal stimulation HEENT: [place]/AT, eyes anicteric Neuro: Arouses to verbal stimulation, follows commands but globally weak.  Right lower extremity unable to wiggle toes, very minimal movement with knee contraction. Chest: Comfortably on 3 L nasal cannula, clear anteriorly, reduced basilar breath sounds. Heart: S1-S2, irregular rhythm, regular rate in the 90s, occasionally tachycardic on telemetry Abdomen: Soft, nontender Skin: Warm, dry, no rashes  WBC 13.2 H/H 8.6/27.5 Platelets 173 INR 1.3 Fibrinogen >800 CXR personally reviewed> RIJ CVC, pulm edema, basilar atelectasis  Resolved Hospital Problem list   NA  Assessment & Plan:  Coronary artery disease s/p CABG x 3 Chronic HFrEF; EF 35-40% Paroxysmal A-fib with RVR -Aspirin; chronically intolerant to statins due to myalgias - Zetia - Pain control per protocol - Needs diuresis -Continue IV metoprolol every 6 hours for rate control, still having some breakthrough - Had sinus pauses previously with amiodarone, may tolerate oral once able to take p.o. meds -Continue telemetry monitoring -heparin gtt for atrial fibrillation  Acute respiratory failure with hypoxia due to atelectasis -Out of bed mobility as able, somewhat limited with right lower  extremity weakness - I-S, flutter valve - Lasix - Wean supplemental oxygen as able  Acute multifocal left frontal stroke causing left-sided weakness, dysphagia -Aspirin, Zetia - Needs chronic anticoagulation for A-fib - Appreciate neurology, PT, OT, SLP management  Hypertension -Metoprolol for rate control, have to balance risk of postop hypertension and chronic heart failure with permissive hypertension from  stroke, avoid hypotension  AKI on CKD stage IIIb -Recheck BMP today - Needs gentle diuresis  Expected perioperative blood loss anemia -Transfuse for hemoglobin less than 7 or hemodynamically significant bleeding, monitor for bleeding -Okay to continue heparin  Hypothyroidism -Continue Synthroid  Best Practice (right click and "Reselect all SmartList Selections" daily)   Diet/type: NPO/nutrition swallow evaluation pending DVT prophylaxis: heparin gtt GI prophylaxis: N/A Lines: Central line Foley:  Yes Code Status:  full code Last date of multidisciplinary goals of care discussion 3/22: Patient and his wife were updated at bedside   Labs   CBC: Recent Labs  Lab 01/04/24 1830 01/04/24 2032 01/05/24 0515 01/05/24 1846 01/06/24 0517 01/07/24 0027 01/07/24 0208  WBC 16.8*  --  13.6* 14.5* 13.7* 13.2*  --   HGB 12.1*   < > 10.9* 11.4* 11.0* 8.6* 10.5*  HCT 36.7*   < > 33.6* 34.6* 33.3* 27.5* 32.2*  MCV 90.4  --  89.6 90.8 90.5 92.0  --   PLT 171  --  168 185 167 173 162   < > = values in this interval not displayed.    Basic Metabolic Panel: Recent Labs  Lab 01/02/24 1330 01/04/24 0816 01/04/24 1150 01/04/24 1247 01/04/24 1830 01/04/24 2032 01/05/24 0515 01/05/24 1846 01/06/24 0517  NA 140   < > 142   < > 141 143 137 137 140  K 4.4   < > 4.4   < > 4.3 3.3* 3.9 4.0 3.6  CL 106   < > 114*  --  108  --  110 106 109  CO2 28  --   --   --  20*  --  22 22 23   GLUCOSE 79   < > 127*  --  154*  --  129* 129* 109*  BUN 25*   < > 27*  --  27*  --  27* 33* 35*  CREATININE 1.61*   < > 1.30*  --  1.73*  --  1.97* 2.38* 2.41*  CALCIUM 8.7*  --   --   --  8.1*  --  7.3* 8.0* 7.7*  MG  --   --   --   --  3.6*  --  2.9* 2.7*  --    < > = values in this interval not displayed.   GFR: Estimated Creatinine Clearance: 27.4 mL/min (A) (by C-G formula based on SCr of 2.41 mg/dL (H)). Recent Labs  Lab 01/05/24 0515 01/05/24 1846 01/06/24 0517 01/07/24 0027  WBC 13.6* 14.5*  13.7* 13.2*     Steffanie Dunn, DO 01/07/24 8:05 AM [facility]  For contact information, see Amion. If no response to pager, please call PCCM consult pager. After hours, 7PM- 7AM, please call Elink.

## 2024-01-07 NOTE — Progress Notes (Addendum)
 Modified Barium Swallow Study  Patient Details  Name: Tyler David MRN: [id number] Date of Birth: [birth year]  Today's Date: 01/07/2024  Modified Barium Swallow completed.  Full report located under Chart Review in the Imaging Section.  History of Present Illness Patient is a 78 y.o. male with PMH: GERD, HLD, HTN, CKD stage III, arthritis, prostate cancer. He was recently diagnosed with CHF and placed on diuretics. He underwent CABG x3 on 01/05/24 and extubated post-surgery to BiPAP and progressed to oxygen via [place]. Following surgery, he was noted to have right leg weakness with intact sensation. 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 MRI brain pending. He failed [place] swallow with RN on 3/22 and was kept NPO awaiting SLP swallow evaluation.   Clinical Impression Pt exhibits mild oropharyngeal dysphagia characterized by deficits primarily related to timing. He achieves timely and complete oral clearance. Suspected cervical osteophytes prohibit complete epiglottic inversion and base of tongue retraction is often incomplete, leading to vallecular residue most notably with solids and the barium tablet. Thin liquids consistently result in trace, transient penetration (PAS 2) which is considered WFL. There was one outlying incident of trace aspiration with thin liquids, which is considered to be mild in nature per the DIGEST. Pt required multiple sips of thin liquids to clear the 13 mm barium tablet from his oral cavity and then bites of puree to clear it from his valleculae. Even in this period of prolonged bolus manipulation, pt maintained complete laryngeal elevation, demonstrating excellent airway protection. Suspect most significant risk is secondary to large, uncontrolled sips and bites which may be prohibited if pt is given full supervision. Consider giving larger pills whole with puree.  Recommend diet of regular solids with thin liquids. He may continue to clear his throat and swallow multiple  times, which is suspected to be a successful compensatory strategy for clearing pharyngeal residue. SLP will f/u at least briefly. Given acute stroke, consider adding orders for a cognitive-linguistic evaluation.  DIGEST Swallow Severity Rating*  Safety: 1  Efficiency: 1   Overall Pharyngeal Swallow Severity: 1 (mild) 1: mild; 2: moderate; 3: severe; 4: profound  *The Dynamic Imaging Grade of Swallowing Toxicity is standardized for the head and neck cancer population, however, demonstrates promising clinical applications across populations to standardize the clinical rating of pharyngeal swallow safety and severity.  Factors that may increase risk of adverse event in presence of aspiration Rubye Oaks & Clearance Coots 2021): Limited mobility  Swallow Evaluation Recommendations Recommendations: PO diet PO Diet Recommendation: Regular;Thin liquids (Level 0) Liquid Administration via: Cup;Straw Medication Administration: Whole meds with puree Supervision: Staff to assist with self-feeding;Full supervision/cueing for swallowing strategies Swallowing strategies  : Minimize environmental distractions;Slow rate;Small bites/sips;Multiple dry swallows after each bite/sip Postural changes: Position pt fully upright for meals Oral care recommendations: Oral care BID (2x/day)    Gwynneth Aliment, M.A., CF-SLP Speech Language Pathology, Acute Rehabilitation Services  Secure Chat preferred [phone]  01/07/2024,11:36 AM

## 2024-01-07 NOTE — Progress Notes (Signed)
 PHARMACY - ANTICOAGULATION CONSULT NOTE  Pharmacy Consult for heparin Indication: atrial fibrillation and stroke  Allergies  Allergen Reactions   Atorvastatin     myalgias  Other Reaction(s): myalgias   Pravachol [Pravastatin]     Muscle aches   Simvastatin    Statins Other (See Comments)    Muscle pain   Penicillins Rash    Has patient had a PCN reaction causing immediate rash, facial/tongue/throat swelling, SOB or lightheadedness with hypotension: No Has patient had a PCN reaction causing severe rash involving mucus membranes or skin necrosis: No Has patient had a PCN reaction that required hospitalization No Has patient had a PCN reaction occurring within the last 10 years: No If all of the above answers are "NO", then may proceed with Cephalosporin use.     Patient Measurements: Height: 5\' 8"  (172.7 cm) Weight: 89 kg (196 lb 3.4 oz) IBW/kg (Calculated) : 68.4 Heparin Dosing Weight: 85kg  Vital Signs: Temp: 98.6 F (37 C) (03/22 2345) Temp Source: Axillary (03/22 2345) BP: 148/89 (03/23 0830) Pulse Rate: 124 (03/23 0830)  Labs: Recent Labs    01/04/24 1244 01/04/24 1247 01/05/24 0515 01/05/24 1846 01/06/24 0517 01/07/24 0027 01/07/24 0208 01/07/24 0819  HGB 11.2*   < > 10.9* 11.4* 11.0* 8.6* 10.5* 10.7*  HCT 33.2*   < > 33.6* 34.6* 33.3* 27.5* 32.2* 33.2*  PLT 134*   < > 168 185 167 173 162  --   APTT 35  --   --   --   --   --  72*  --   LABPROT 17.6*  --   --   --   --   --  16.4*  --   INR 1.4*  --   --   --   --   --  1.3*  --   HEPARINUNFRC  --   --   --   --   --  0.26*  --   --   CREATININE  --    < > 1.97* 2.38* 2.41*  --   --   --    < > = values in this interval not displayed.    Estimated Creatinine Clearance: 27.4 mL/min (A) (by C-G formula based on SCr of 2.41 mg/dL (H)).   Medical History: Past Medical History:  Diagnosis Date   Arthritis    Cancer (HCC)    prostate cancer    Cardiomyopathy (HCC)    Chronic kidney disease, stage 3  (HCC)    Coronary artery disease    ED (erectile dysfunction)    GERD (gastroesophageal reflux disease)    Headache    hx of migraine-2015    Hyperlipidemia    Hypertension    Hypothyroidism    LBBB (left bundle branch block)    Migraine    Nocturia    Overactive bladder    Pneumonia    hx of at age 50     Medications:  Medications Prior to Admission  Medication Sig Dispense Refill Last Dose/Taking   amLODipine (NORVASC) 5 MG tablet Take 1 tablet (5 mg total) by mouth daily. 90 tablet 3 01/03/2024 at  6:30 PM   Cholecalciferol (VITAMIN D-3) 25 MCG (1000 UT) CAPS Take 1,000 Units by mouth daily.   Past Week   famotidine (PEPCID) 20 MG tablet Take 20 mg by mouth daily.   01/03/2024 at  6:30 PM   furosemide (LASIX) 20 MG tablet Take 1 tablet (20 mg total) by mouth daily as needed (  swelling or weight gain (2 pounds in 24 hours or 5 pounds in 1 week)).   Past Week   levothyroxine (SYNTHROID) 50 MCG tablet Take 50 mcg by mouth daily before breakfast.   01/04/2024 at  4:15 AM   MAGNESIUM PO Take 420 mg by mouth daily.   Past Week   metoprolol succinate (TOPROL-XL) 50 MG 24 hr tablet Take 50 mg by mouth at bedtime. Take with or immediately following a meal.   01/03/2024 at  6:30 PM   Multiple Vitamins-Minerals (ICAPS AREDS 2 PO) Take 1 capsule by mouth 2 (two) times daily.   Past Week   nitroGLYCERIN (NITROSTAT) 0.4 MG SL tablet Place 1 tablet (0.4 mg total) under the tongue every 5 (five) minutes as needed for chest pain. 25 tablet PRN Taking As Needed   Omega-3 Fatty Acids (FISH OIL) 1000 MG CAPS Take 1,000 mg by mouth daily.   Past Week   sacubitril-valsartan (ENTRESTO) 49-51 MG Take 1 tablet by mouth 2 (two) times daily. 180 tablet 3 12/31/2023   Turmeric 500 MG CAPS Take 500 mg by mouth 2 (two) times daily.   Past Week   Scheduled:   acetaminophen  1,000 mg Oral Q6H   Or   acetaminophen (TYLENOL) oral liquid 160 mg/5 mL  1,000 mg Per Tube Q6H   amiodarone  150 mg Intravenous Once    amLODipine  5 mg Oral Daily   aspirin EC  325 mg Oral Daily   Or   aspirin  324 mg Per Tube Daily   bisacodyl  10 mg Oral Daily   Or   bisacodyl  10 mg Rectal Daily   Chlorhexidine Gluconate Cloth  6 each Topical Daily   docusate sodium  200 mg Oral Daily   ezetimibe  10 mg Oral Daily   insulin aspart  0-24 Units Subcutaneous Q4H   levothyroxine  50 mcg Oral QAC breakfast   metoprolol tartrate  12.5 mg Oral BID   Or   metoprolol tartrate  12.5 mg Per Tube BID   metoprolol tartrate  5 mg Intravenous Q6H   pantoprazole (PROTONIX) IV  40 mg Intravenous QHS   Infusions:   albumin human     amiodarone     Followed by   amiodarone     heparin 1,200 Units/hr (01/07/24 0824)   magnesium sulfate bolus IVPB 2 g (01/07/24 0804)    Assessment: Pt s/p CABG x3. Now with afib and CVA. Heparin started last night per neurology recommendations.  Several hours later patient had bleeding at chest tube site and pink urine.  Hgb dropped to 8.6.  Heparin was held over night.  Hgb rechecked 2 hrs later back up to 10.5.    Discussed with Dr. Leafy Ro this AM - okay to resume heparin with reduced goal level for CVA/bleeding.  Goal of Therapy:  Heparin level 0.3-0.5 units/ml Monitor platelets by anticoagulation protocol: Yes   Plan:  Resume IV heparin at 1200 units/hr Check heparin level in 8 hrs. Daily heparin level and CBC.  Reece Leader, Colon Flattery, [facility] Clinical Pharmacist  01/07/2024 8:49 AM   [facility] pharmacy phone numbers are listed on [email or web address]

## 2024-01-08 ENCOUNTER — Encounter (HOSPITAL_COMMUNITY)

## 2024-01-08 DIAGNOSIS — I4891 Unspecified atrial fibrillation: Secondary | ICD-10-CM | POA: Diagnosis not present

## 2024-01-08 DIAGNOSIS — I639 Cerebral infarction, unspecified: Secondary | ICD-10-CM | POA: Diagnosis not present

## 2024-01-08 DIAGNOSIS — Z951 Presence of aortocoronary bypass graft: Secondary | ICD-10-CM | POA: Diagnosis not present

## 2024-01-08 DIAGNOSIS — R1310 Dysphagia, unspecified: Secondary | ICD-10-CM | POA: Diagnosis not present

## 2024-01-08 DIAGNOSIS — I5022 Chronic systolic (congestive) heart failure: Secondary | ICD-10-CM | POA: Diagnosis not present

## 2024-01-08 LAB — BASIC METABOLIC PANEL
Anion gap: 6 (ref 5–15)
Anion gap: 8 (ref 5–15)
BUN: 45 mg/dL — ABNORMAL HIGH (ref 8–23)
BUN: 42 mg/dL — ABNORMAL HIGH (ref 8–23)
CO2: 25 mmol/L (ref 22–32)
CO2: 22 mmol/L (ref 22–32)
Calcium: 7.4 mg/dL — ABNORMAL LOW (ref 8.9–10.3)
Calcium: 7.5 mg/dL — ABNORMAL LOW (ref 8.9–10.3)
Chloride: 109 mmol/L (ref 98–111)
Chloride: 107 mmol/L (ref 98–111)
Creatinine, Ser: 2.23 mg/dL — ABNORMAL HIGH (ref 0.61–1.24)
Creatinine, Ser: 2.04 mg/dL — ABNORMAL HIGH (ref 0.61–1.24)
GFR, Estimated: 29 mL/min — ABNORMAL LOW (ref >60)
GFR, Estimated: 33 mL/min — ABNORMAL LOW (ref >60)
Glucose, Bld: 115 mg/dL — ABNORMAL HIGH (ref 70–99)
Glucose, Bld: 114 mg/dL — ABNORMAL HIGH (ref 70–99)
Potassium: 3.6 mmol/L (ref 3.5–5.1)
Potassium: 4 mmol/L (ref 3.5–5.1)
Sodium: 140 mmol/L (ref 135–145)
Sodium: 137 mmol/L (ref 135–145)

## 2024-01-08 LAB — CBC
HCT: 29.3 % — ABNORMAL LOW (ref 39.0–52.0)
HCT: 31.2 % — ABNORMAL LOW (ref 39.0–52.0)
Hemoglobin: 9.4 g/dL — ABNORMAL LOW (ref 13.0–17.0)
Hemoglobin: 10.2 g/dL — ABNORMAL LOW (ref 13.0–17.0)
MCH: 29.3 pg (ref 26.0–34.0)
MCH: 29.3 pg (ref 26.0–34.0)
MCHC: 32.1 g/dL (ref 30.0–36.0)
MCHC: 32.7 g/dL (ref 30.0–36.0)
MCV: 91.3 fL (ref 80.0–100.0)
MCV: 89.7 fL (ref 80.0–100.0)
Platelets: 152 10*3/uL (ref 150–400)
Platelets: 213 10*3/uL (ref 150–400)
RBC: 3.21 MIL/uL — ABNORMAL LOW (ref 4.22–5.81)
RBC: 3.48 MIL/uL — ABNORMAL LOW (ref 4.22–5.81)
RDW: 13.8 % (ref 11.5–15.5)
RDW: 14 % (ref 11.5–15.5)
WBC: 9.1 10*3/uL (ref 4.0–10.5)
WBC: 11.1 10*3/uL — ABNORMAL HIGH (ref 4.0–10.5)
nRBC: 0 % (ref 0.0–0.2)
nRBC: 0 % (ref 0.0–0.2)

## 2024-01-08 LAB — GLUCOSE, CAPILLARY
Glucose-Capillary: 95 mg/dL (ref 70–99)
Glucose-Capillary: 205 mg/dL — ABNORMAL HIGH (ref 70–99)
Glucose-Capillary: 141 mg/dL — ABNORMAL HIGH (ref 70–99)
Glucose-Capillary: 145 mg/dL — ABNORMAL HIGH (ref 70–99)
Glucose-Capillary: 106 mg/dL — ABNORMAL HIGH (ref 70–99)

## 2024-01-08 LAB — HEPARIN LEVEL (UNFRACTIONATED): Heparin Unfractionated: 0.34 [IU]/mL (ref 0.30–0.70)

## 2024-01-08 LAB — MAGNESIUM: Magnesium: 3.2 mg/dL — ABNORMAL HIGH (ref 1.7–2.4)

## 2024-01-08 MED ORDER — ASPIRIN 81 MG PO TBEC
81.0000 mg | DELAYED_RELEASE_TABLET | Freq: Every day | ORAL | Status: DC
  Administered 2024-01-08 – 2024-01-11 (×4): 81 mg via ORAL
  Filled 2024-01-08 (×5): qty 1

## 2024-01-08 MED ORDER — POTASSIUM CHLORIDE 20 MEQ PO PACK
20.0000 meq | PACK | Freq: Once | ORAL | Status: AC
  Administered 2024-01-08: 20 meq via ORAL
  Filled 2024-01-08: qty 1

## 2024-01-08 MED ORDER — MAGNESIUM SULFATE 2 GM/50ML IV SOLN
2.0000 g | Freq: Once | INTRAVENOUS | Status: AC
  Administered 2024-01-08: 2 g via INTRAVENOUS
  Filled 2024-01-08: qty 50

## 2024-01-08 MED ORDER — AMIODARONE LOAD VIA INFUSION
150.0000 mg | Freq: Once | INTRAVENOUS | Status: DC
  Filled 2024-01-08: qty 83.34

## 2024-01-08 MED ORDER — STROKE: EARLY STAGES OF RECOVERY BOOK
Freq: Once | Status: AC
  Filled 2024-01-08: qty 1

## 2024-01-08 MED ORDER — PANTOPRAZOLE SODIUM 40 MG PO TBEC
40.0000 mg | DELAYED_RELEASE_TABLET | Freq: Every day | ORAL | Status: DC
  Administered 2024-01-08 – 2024-01-12 (×5): 40 mg via ORAL
  Filled 2024-01-08 (×5): qty 1

## 2024-01-08 MED ORDER — AMIODARONE LOAD VIA INFUSION
150.0000 mg | Freq: Once | INTRAVENOUS | Status: AC
  Administered 2024-01-08: 150 mg via INTRAVENOUS
  Filled 2024-01-08: qty 83.34

## 2024-01-08 MED ORDER — APIXABAN 5 MG PO TABS
5.0000 mg | ORAL_TABLET | Freq: Two times a day (BID) | ORAL | Status: DC
  Administered 2024-01-08 – 2024-01-12 (×9): 5 mg via ORAL
  Filled 2024-01-08 (×9): qty 1

## 2024-01-08 MED ORDER — ASPIRIN 81 MG PO CHEW
81.0000 mg | CHEWABLE_TABLET | Freq: Every day | ORAL | Status: DC
Start: 2024-01-08 — End: 2024-01-12
  Administered 2024-01-12: 81 mg
  Filled 2024-01-08 (×2): qty 1

## 2024-01-08 MED ORDER — LACTATED RINGERS IV BOLUS
500.0000 mL | Freq: Once | INTRAVENOUS | Status: AC
  Administered 2024-01-08: 500 mL via INTRAVENOUS

## 2024-01-08 NOTE — Progress Notes (Signed)
 NAME:  Tyler David, MRN:  [id number], DOB:  Aug 24, 1946, LOS: 4 ADMISSION DATE:  01/04/2024, CONSULTATION DATE:  01/04/2024 REFERRING MD: Dr. Cliffton Asters, CHIEF COMPLAINT: Status post CABG  History of Present Illness:  78 year old male with CKD stage IIIb, hypertension, hyperlipidemia, prior history of prostate cancer who initially presented with chest pain, shortness of breath and x-ray chest was suggestive of pulmonary edema.  He underwent echocardiogram which showed EF 35-40%.  Underwent cardiac cath and was diagnosed with multivessel obstructive coronary artery disease.  Patient underwent CABG x 3 today, remained on dobutamine, intubated and was transferred to ICU.  PCCM was consulted for help evaluation medical management  Pertinent  Medical History   Past Medical History:  Diagnosis Date   Arthritis    Cancer [facility])    prostate cancer    Cardiomyopathy (HCC)    Chronic kidney disease, stage 3 (HCC)    Coronary artery disease    ED (erectile dysfunction)    GERD (gastroesophageal reflux disease)    Headache    hx of migraine-2015    Hyperlipidemia    Hypertension    Hypothyroidism    LBBB (left bundle branch block)    Migraine    Nocturia    Overactive bladder    Pneumonia    hx of at age 63     Significant Hospital Events: Including procedures, antibiotic start and stop dates in addition to other pertinent events   3/20 CABG x 3 3/21 post-op Afib with RVR> amiodarone, then had sinus pauses once converted out 3/22 RLE weakness> L frontal ischemic stroke   Interim History / Subjective:  Overnight in and out of Afib, bradycardic when in sinus rhythm.  Objective   Blood pressure (!) 143/58, pulse (!) 57, temperature 97.7 F (36.5 C), temperature source Axillary, resp. rate 14, height 5\' 8"  (1.727 m), weight 88.1 kg, SpO2 95%.        Intake/Output Summary (Last 24 hours) at 01/08/2024 0716 Last data filed at 01/08/2024 0700 Gross per 24 hour  Intake 1474.16 ml   Output 1805 ml  Net -330.84 ml   Filed Weights   01/06/24 0327 01/07/24 0500 01/08/24 0500  Weight: 90.3 kg 89 kg 88.1 kg    Examination: General: elderly man lying in bed in NAD HEENT:  [place]/AT, eyes anicteric Neuro: awake, alert, able to weakly move LUE 1/5, not moving RLE at all today. Chest:  breathing comfortably on [place], reduced basilar breath sounds, no conversational dyspnea. Heart: S1S2, RRR Abdomen: soft, NT Skin: warm, dry, no rashes  BUN 45 Cr 2.23 WBC 9.1 H/H 9.4/29.3 Platelets 152  Resolved Hospital Problem list   NA  Assessment & Plan:  Coronary artery disease s/p CABG x 3 Chronic HFrEF; EF 35-40% Paroxysmal A-fib with RVR -con't aspirin, but intolerant to statin due to myalgias. Con't zetia.  -pain control per protocol -metoprolol -additional amiodarone bolus for RVR -tele monitoring -switch to eliquis today  Acute respiratory failure with hypoxia due to atelectasis -OOB mobility as able -IS -hold additional diuresis -wean O2 as able to maintain SpO2>90%  Acute multifocal left frontal stroke causing left-sided weakness, dysphagia -aspirin, zetia; intolerant to statins -PT, OT, SLP, appreciate Neurology.   Hypertension -con't amlodipine, metoprolol, amiodarone  AKI on CKD stage IIIb -500cc LR, holding additional diuresis -monitor -renally dose meds, avoid nephrotoxic meds  Expected perioperative blood loss anemia -transfuse for Hb <7 or hemodynamically significnat bleeding  Hypothyroidism -synthroid  Debility -PT, OT -CIR consulted  Best Practice (right  click and "Reselect all SmartList Selections" daily)   Diet/type: reg diet DVT prophylaxis: apixaban GI prophylaxis: N/A Lines: Central line- needed for amiodarone Foley:  Yes Code Status:  full code Last date of multidisciplinary goals of care discussion 3/22: Patient and his wife were updated at bedside   Labs   CBC: Recent Labs  Lab 01/05/24 1846 01/06/24 0517  01/07/24 0027 01/07/24 0208 01/07/24 0819 01/07/24 1623 01/08/24 0224  WBC 14.5* 13.7* 13.2*  --   --  11.3* 9.1  HGB 11.4* 11.0* 8.6* 10.5* 10.7* 10.3* 9.4*  HCT 34.6* 33.3* 27.5* 32.2* 33.2* 31.3* 29.3*  MCV 90.8 90.5 92.0  --   --  90.5 91.3  PLT 185 167 173 162  --  175 152    Basic Metabolic Panel: Recent Labs  Lab 01/04/24 1830 01/04/24 2032 01/05/24 0515 01/05/24 1846 01/06/24 0517 01/07/24 0819 01/08/24 0224  NA 141   < > 137 137 140 141 140  K 4.3   < > 3.9 4.0 3.6 3.8 3.6  CL 108  --  110 106 109 111 109  CO2 20*  --  22 22 23  21* 25  GLUCOSE 154*  --  129* 129* 109* 88 115*  BUN 27*  --  27* 33* 35* 38* 45*  CREATININE 1.73*  --  1.97* 2.38* 2.41* 1.98* 2.23*  CALCIUM 8.1*  --  7.3* 8.0* 7.7* 7.9* 7.4*  MG 3.6*  --  2.9* 2.7*  --   --   --    < > = values in this interval not displayed.   GFR: Estimated Creatinine Clearance: 29.5 mL/min (A) (by C-G formula based on SCr of 2.23 mg/dL (H)). Recent Labs  Lab 01/06/24 0517 01/07/24 0027 01/07/24 1623 01/08/24 0224  WBC 13.7* 13.2* 11.3* 9.1     Steffanie Dunn, DO 01/08/24 8:15 AM [facility]  For contact information, see Amion. If no response to pager, please call PCCM consult pager. After hours, 7PM- 7AM, please call Elink.

## 2024-01-08 NOTE — Progress Notes (Signed)
 PHARMACY - ANTICOAGULATION CONSULT NOTE  Pharmacy Consult for heparin > apixaban  Indication: atrial fibrillation and stroke  Allergies  Allergen Reactions   Atorvastatin     myalgias  Other Reaction(s): myalgias   Pravachol [Pravastatin]     Muscle aches   Simvastatin    Statins Other (See Comments)    Muscle pain   Penicillins Rash    Has patient had a PCN reaction causing immediate rash, facial/tongue/throat swelling, SOB or lightheadedness with hypotension: No Has patient had a PCN reaction causing severe rash involving mucus membranes or skin necrosis: No Has patient had a PCN reaction that required hospitalization No Has patient had a PCN reaction occurring within the last 10 years: No If all of the above answers are "NO", then may proceed with Cephalosporin use.     Patient Measurements: Height: 5\' 8"  (172.7 cm) Weight: 88.1 kg (194 lb 3.6 oz) IBW/kg (Calculated) : 68.4 Heparin Dosing Weight: 85kg  Vital Signs: Temp: 97.7 F (36.5 C) (03/24 0400) Temp Source: Axillary (03/24 0400) BP: 143/58 (03/24 0700) Pulse Rate: 57 (03/24 0700)  Labs: Recent Labs    01/06/24 0517 01/07/24 0027 01/07/24 0208 01/07/24 0819 01/07/24 1623 01/08/24 0224  HGB 11.0* 8.6* 10.5* 10.7* 10.3* 9.4*  HCT 33.3* 27.5* 32.2* 33.2* 31.3* 29.3*  PLT 167 173 162  --  175 152  APTT  --   --  72*  --   --   --   LABPROT  --   --  16.4*  --   --   --   INR  --   --  1.3*  --   --   --   HEPARINUNFRC  --  0.26*  --   --  0.16* 0.34  CREATININE 2.41*  --   --  1.98*  --  2.23*    Estimated Creatinine Clearance: 29.5 mL/min (A) (by C-G formula based on SCr of 2.23 mg/dL (H)).   Medical History: Past Medical History:  Diagnosis Date   Arthritis    Cancer (HCC)    prostate cancer    Cardiomyopathy (HCC)    Chronic kidney disease, stage 3 (HCC)    Coronary artery disease    ED (erectile dysfunction)    GERD (gastroesophageal reflux disease)    Headache    hx of migraine-2015     Hyperlipidemia    Hypertension    Hypothyroidism    LBBB (left bundle branch block)    Migraine    Nocturia    Overactive bladder    Pneumonia    hx of at age 74     Medications:  Medications Prior to Admission  Medication Sig Dispense Refill Last Dose/Taking   amLODipine (NORVASC) 5 MG tablet Take 1 tablet (5 mg total) by mouth daily. 90 tablet 3 01/03/2024 at  6:30 PM   Cholecalciferol (VITAMIN D-3) 25 MCG (1000 UT) CAPS Take 1,000 Units by mouth daily.   Past Week   famotidine (PEPCID) 20 MG tablet Take 20 mg by mouth daily.   01/03/2024 at  6:30 PM   furosemide (LASIX) 20 MG tablet Take 1 tablet (20 mg total) by mouth daily as needed (swelling or weight gain (2 pounds in 24 hours or 5 pounds in 1 week)).   Past Week   levothyroxine (SYNTHROID) 50 MCG tablet Take 50 mcg by mouth daily before breakfast.   01/04/2024 at  4:15 AM   MAGNESIUM PO Take 420 mg by mouth daily.   Past Week  metoprolol succinate (TOPROL-XL) 50 MG 24 hr tablet Take 50 mg by mouth at bedtime. Take with or immediately following a meal.   01/03/2024 at  6:30 PM   Multiple Vitamins-Minerals (ICAPS AREDS 2 PO) Take 1 capsule by mouth 2 (two) times daily.   Past Week   nitroGLYCERIN (NITROSTAT) 0.4 MG SL tablet Place 1 tablet (0.4 mg total) under the tongue every 5 (five) minutes as needed for chest pain. 25 tablet PRN Taking As Needed   Omega-3 Fatty Acids (FISH OIL) 1000 MG CAPS Take 1,000 mg by mouth daily.   Past Week   sacubitril-valsartan (ENTRESTO) 49-51 MG Take 1 tablet by mouth 2 (two) times daily. 180 tablet 3 12/31/2023   Turmeric 500 MG CAPS Take 500 mg by mouth 2 (two) times daily.   Past Week   Scheduled:   acetaminophen  1,000 mg Oral Q6H   Or   acetaminophen (TYLENOL) oral liquid 160 mg/5 mL  1,000 mg Per Tube Q6H   amiodarone  150 mg Intravenous Once   amiodarone  150 mg Intravenous Once   amLODipine  5 mg Oral Daily   apixaban  5 mg Oral BID   aspirin EC  81 mg Oral Daily   Or   aspirin  81 mg Per  Tube Daily   bisacodyl  10 mg Oral Daily   Or   bisacodyl  10 mg Rectal Daily   Chlorhexidine Gluconate Cloth  6 each Topical Daily   docusate sodium  200 mg Oral Daily   ezetimibe  10 mg Oral Daily   insulin aspart  0-24 Units Subcutaneous Q4H   levothyroxine  50 mcg Oral QAC breakfast   metoprolol tartrate  12.5 mg Oral BID   Or   metoprolol tartrate  12.5 mg Per Tube BID   pantoprazole  40 mg Oral Daily   Infusions:   albumin human     amiodarone 60 mg/hr (01/08/24 0800)   heparin 1,350 Units/hr (01/08/24 0700)   magnesium sulfate bolus IVPB      Assessment: Pt s/p CABG x3. With post op afib RVR and CVA.  Heparin started per neurology recommendations.  Heparin drip held over the weekend with some hematuria > resolved, hgb stable 9-10 and heparin drip restarted 1350 uts/hr with therapeutic heparin level 0.3.    EPW out, CT out passed swallow study 3/22 Discussed on rounds this am > transition to apixaban Cr > 1.5, wt > 60kg, age < 80yo  Goal of Therapy:  Heparin level 0.3-0.5 units/ml Monitor platelets by anticoagulation protocol: Yes   Plan:  Stop heparin  Apixaban 5mg  BID Monitor s/s bleeding     Leota Sauers Pharm.D. CPP, BCPS Clinical Pharmacist [phone] 01/08/2024 8:09 AM '

## 2024-01-08 NOTE — Evaluation (Signed)
 Occupational Therapy Evaluation Patient Details Name: Tyler David MRN: [id number] DOB: Jan 13, 1946 Today's Date: 01/08/2024   History of Present Illness   78 y.o. male presents to [facility] 01/04/24 for CABGx3.3/21 CT head showed small left frontal strokes. PMHx:  CKD stage IIIb, hypertension, hyperlipidemia, prostate cancer     Clinical Impressions Pt was independent prior to admission. Presents with lethargy, which has improved per chart review and wife's report, R LE and L UE weakness with decreased proprioception, and impaired cognition. Pt requires +2 max assist for bed mobility and was able to stand with +2 mod assist using bed pad under hips and knees blocked. Transferred bed to chair with Stedy. Pt requires max to total assist for ADLs. He will need intensive rehab prior to return home with his supportive family. Patient will benefit from intensive inpatient follow-up therapy, >3 hours/day.     If plan is discharge home, recommend the following:   Two people to help with walking and/or transfers;Two people to help with bathing/dressing/bathroom;Assistance with feeding;Direct supervision/assist for medications management;Direct supervision/assist for financial management;Assist for transportation;Help with stairs or ramp for entrance     Functional Status Assessment   Patient has had a recent decline in their functional status and demonstrates the ability to make significant improvements in function in a reasonable and predictable amount of time.     Equipment Recommendations   Other (comment) (defer to next venue)     Recommendations for Other Services         Precautions/Restrictions   Precautions Precautions: Fall;Sternal Recall of Precautions/Restrictions: Impaired Precaution/Restrictions Comments: foley catheter Restrictions Weight Bearing Restrictions Per Provider Order: No Other Position/Activity Restrictions: sternal     Mobility Bed Mobility Overal  bed mobility: Needs Assistance Bed Mobility: Rolling, Sidelying to Sit Rolling: Max assist, +2 for physical assistance Sidelying to sit: Max assist, +2 for physical assistance       General bed mobility comments: rolled pt to get off bed pan, maxA for trunk elevation and LE management off EOB    Transfers Overall transfer level: Needs assistance Equipment used: 2 person hand held assist, Ambulation equipment used Transfers: Sit to/from Stand Sit to Stand: +2 physical assistance, Mod assist, From elevated surface           General transfer comment: stoodx1 with face to face transfer and use of bed pad to power up, pt with lean to the R due to R knee flexion, stoodx3 in stedy with minAx2 and tactile cues to R thigh/knee to achieve knee extension Transfer via Lift Equipment: Stedy    Balance Overall balance assessment: Needs assistance, Mild deficits observed, not formally tested Sitting-balance support: Bilateral upper extremity supported, Feet supported Sitting balance-Leahy Scale: Fair Sitting balance - Comments: slight L lean, unaware of midline   Standing balance support: Bilateral upper extremity supported, During functional activity, Reliant on assistive device for balance Standing balance-Leahy Scale: Zero                             ADL either performed or assessed with clinical judgement   ADL Overall ADL's : Needs assistance/impaired                                       General ADL Comments: max to total assist for all ADLs     Vision Baseline Vision/History: 1 Wears glasses Patient  Visual Report: No change from baseline Additional Comments: pt reports diplopia has resolved,  unable to conform to requirements of occulomotor and field testing     Perception Perception: Impaired Preception Impairment Details: Body Scheme Perception-Other Comments: impaired left-right awareness   Praxis Praxis: Not tested       Pertinent  Vitals/Pain Pain Assessment Pain Assessment: No/denies pain     Extremity/Trunk Assessment Upper Extremity Assessment Upper Extremity Assessment: Right hand dominant;LUE deficits/detail LUE Deficits / Details: 2/5 shoulder, 3-/5 elbow, 3/5 forearm, flexes and extends digits slightly LUE Sensation: decreased proprioception LUE Coordination: decreased fine motor;decreased gross motor   Lower Extremity Assessment Lower Extremity Assessment: Defer to PT evaluation   Cervical / Trunk Assessment Cervical / Trunk Assessment: Other exceptions Cervical / Trunk Exceptions: weakness   Communication Communication Communication: Impaired Factors Affecting Communication: Reduced clarity of speech;Difficulty expressing self   Cognition Arousal: Lethargic Behavior During Therapy: Flat affect Cognition: Cognition impaired   Orientation impairments: Time Awareness: Intellectual awareness intact, Online awareness impaired Memory impairment (select all impairments): Short-term memory, Working memory Attention impairment (select first level of impairment): Sustained attention Executive functioning impairment (select all impairments): Initiation, Sequencing, Problem solving OT - Cognition Comments: slow processing speed, cues for sternal precautions                 Following commands: Impaired Following commands impaired: Follows one step commands with increased time     Cueing  General Comments   Cueing Techniques: Verbal cues;Tactile cues;Visual cues  VSS   Exercises     Shoulder Instructions      Home Living Family/patient expects to be discharged to:: Private residence Living Arrangements: Spouse/significant other;Children Available Help at Discharge: Family;Available 24 hours/day Type of Home: House Home Access: Level entry     Home Layout: One level               Home Equipment: None      Lives With: Spouse    Prior Functioning/Environment Prior Level of  Function : Independent/Modified Independent             Mobility Comments: Ind w/ no AD ADLs Comments: Ind    OT Problem List: Decreased strength;Decreased activity tolerance;Impaired balance (sitting and/or standing);Impaired vision/perception;Decreased coordination;Decreased cognition;Decreased safety awareness;Decreased knowledge of use of DME or AE;Decreased knowledge of precautions;Cardiopulmonary status limiting activity;Impaired UE functional use   OT Treatment/Interventions: Self-care/ADL training;DME and/or AE instruction;Neuromuscular education;Therapeutic activities;Visual/perceptual remediation/compensation;Patient/family education;Balance training;Cognitive remediation/compensation      OT Goals(Current goals can be found in the care plan section)   Acute Rehab OT Goals OT Goal Formulation: With family Time For Goal Achievement: 01/22/24 Potential to Achieve Goals: Good ADL Goals Pt Will Perform Eating: with min assist;sitting Pt Will Perform Grooming: with min assist;sitting Pt Will Perform Upper Body Dressing: with mod assist;sitting Pt Will Transfer to Toilet: with mod assist;stand pivot transfer;bedside commode Pt/caregiver will Perform Home Exercise Program: Left upper extremity;With minimal assist;With written HEP provided Additional ADL Goal #1: Pt will participate in visual field and oculomotor assessment. Additional ADL Goal #2: Pt will follow 2 step commands with 50% accuracy.   OT Frequency:  Min 3X/week    Co-evaluation PT/OT/SLP Co-Evaluation/Treatment: Yes Reason for Co-Treatment: Complexity of the patient's impairments (multi-system involvement) PT goals addressed during session: Mobility/safety with mobility OT goals addressed during session: ADL's and self-care;Strengthening/ROM      AM-PAC OT "6 Clicks" Daily Activity     Outcome Measure Help from another person eating meals?: A Lot Help from another  person taking care of personal grooming?:  A Lot Help from another person toileting, which includes using toliet, bedpan, or urinal?: Total Help from another person bathing (including washing, rinsing, drying)?: Total Help from another person to put on and taking off regular upper body clothing?: Total Help from another person to put on and taking off regular lower body clothing?: Total 6 Click Score: 8   End of Session Equipment Utilized During Treatment: Gait belt Nurse Communication: Mobility status;Need for lift equipment  Activity Tolerance: Patient tolerated treatment well Patient left: in chair;with call bell/phone within reach;with family/visitor present;with nursing/sitter in room  OT Visit Diagnosis: Unsteadiness on feet (R26.81);Muscle weakness (generalized) (M62.81);Other symptoms and signs involving cognitive function                Time: 1205-1228 OT Time Calculation (min): 23 min Charges:  OT General Charges $OT Visit: 1 Visit OT Evaluation $OT Eval Moderate Complexity: 1 Mod Berna Spare, OTR/L Acute Rehabilitation Services Office: [phone]   Evern Bio 01/08/2024, 2:33 PM

## 2024-01-08 NOTE — Progress Notes (Signed)
      [address]       [place] [address]             [phone]       POD # 4 CABG  Sleeping  BP (!) 161/59   Pulse 67   Temp 97.6 F (36.4 C) (Axillary)   Resp 19   Ht 5\' 8"  (1.727 m)   Wt 88.1 kg   SpO2 94%   BMI 29.53 kg/m  In SR on amiodarone   Intake/Output Summary (Last 24 hours) at 01/08/2024 1805 Last data filed at 01/08/2024 1500 Gross per 24 hour  Intake 1316.61 ml  Output 910 ml  Net 406.61 ml    Creatinine 2.04 down from 2.23 K 4.0  Continue current care  Rivan Siordia C. Dorris Fetch, MD [facility] [phone]

## 2024-01-08 NOTE — Evaluation (Signed)
 Speech Language Pathology Evaluation Patient Details Name: Tyler David MRN: [id number] DOB: 1946-09-20 Today's Date: 01/08/2024 Time: 4098-1191 SLP Time Calculation (min) (ACUTE ONLY): 21 min  Problem List:  Patient Active Problem List   Diagnosis Date Noted   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   Angina pectoris (HCC) 12/15/2023   HFrEF (heart failure with reduced ejection fraction) (HCC) 12/15/2023   Palpitations 12/16/2019   PVC's (premature ventricular contractions) 12/16/2019   Left bundle branch block 12/16/2019   Essential hypertension 12/16/2019   Osteoarthritis of knee 06/07/2018   S/P left THA, 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   Past Medical History:  Past Medical History:  Diagnosis Date   Arthritis    Cancer (HCC)    prostate cancer    Cardiomyopathy (HCC)    Chronic kidney disease, stage 3 (HCC)    Coronary artery disease    ED (erectile dysfunction)    GERD (gastroesophageal reflux disease)    Headache    hx of migraine-2015    Hyperlipidemia    Hypertension    Hypothyroidism    LBBB (left bundle branch block)    Migraine    Nocturia    Overactive bladder    Pneumonia    hx of at age 58    Past Surgical History:  Past Surgical History:  Procedure Laterality Date   CARPAL TUNNEL RELEASE Bilateral    CORONARY ARTERY BYPASS GRAFT N/A 01/04/2024   Procedure: CORONARY ARTERY BYPASS GRAFTING TIMES THREE USING LEFT INTERNAL MAMMARY ARTERY AND RIGHT GREAT SAPHENOUS VEIN HARVESTED ENDOSCOPICALLY;  Surgeon: Corliss Skains, MD;  Location: [facility] OR;  Service: Open Heart Surgery;  Laterality: N/A;   INTRAOPERATIVE TRANSESOPHAGEAL ECHOCARDIOGRAM N/A 01/04/2024   Procedure: ECHOCARDIOGRAM, TRANSESOPHAGEAL, INTRAOPERATIVE;  Surgeon: Corliss Skains, MD;  Location: [facility] OR;  Service: Open Heart Surgery;  Laterality: N/A;   PROSTATE SURGERY     RIGHT/LEFT HEART CATH AND CORONARY ANGIOGRAPHY Bilateral 12/15/2023   Procedure: RIGHT/LEFT HEART CATH AND CORONARY ANGIOGRAPHY;  Surgeon: Yvonne Kendall, MD;  Location: [facility] INVASIVE CV LAB;  Service: Cardiovascular;  Laterality: Bilateral;   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   Procedure: LEFT TOTAL HIP ARTHROPLASTY ANTERIOR APPROACH;  Surgeon: Durene Romans, MD;  Location: [facility] ORS;  Service: Orthopedics;  Laterality: Left;   HPI:  Patient is a 78 y.o. male with PMH: GERD, HLD, HTN, CKD stage III, arthritis, prostate cancer. He was recently diagnosed with CHF and placed on diuretics. He underwent CABG x3 on 01/05/24 and extubated post-surgery to BiPAP and progressed to oxygen via [place]. Following surgery, he was noted to have right leg weakness with intact sensation. 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   Assessment / Plan / Recommendation Clinical Impression  Cognitive-linguistic evaluation complete. Patient presents with mild deficits in the areas of attention and orientation, with intermittently delayed processing and carryout of a task although question if this is related to lethargy as at time, patient able to complete a moderately complex tasks very quickly. Per spouse, today is the most alert patient has been with eyes closing periodically during session but easily aroused. Although deficits mild at this time, SLP will f/u as we may see a different presentation of cognition as patient becomes more alert. Spouse in agreement.    SLP Assessment  SLP Recommendation/Assessment: Patient needs continued Speech Lanaguage Pathology Services SLP Visit Diagnosis: Attention and concentration deficit Attention and concentration deficit following: Cerebral infarction    Recommendations for follow up therapy are one component of a multi-disciplinary discharge planning process,  led by the attending physician.  Recommendations may be updated based on patient status, additional functional criteria and insurance authorization.    Follow Up Recommendations  Acute inpatient rehab (3hours/day)    Assistance Recommended at Discharge  Frequent  or constant Supervision/Assistance  Functional Status Assessment Patient has had a recent decline in their functional status and demonstrates the ability to make significant improvements in function in a reasonable and predictable amount of time.  Frequency and Duration min 2x/week  2 weeks      SLP Evaluation Cognition  Overall Cognitive Status: Impaired/Different from baseline Arousal/Alertness: Lethargic Orientation Level: Oriented to person;Oriented to place;Oriented to situation (required min questioning cues for full orientation to situation) Attention: Sustained Sustained Attention: Impaired Sustained Attention Impairment: Functional complex Memory: Impaired Memory Impairment: Storage deficit Awareness: Appears intact Problem Solving: Appears intact Safety/Judgment: Appears intact       Comprehension  Auditory Comprehension Overall Auditory Comprehension: Appears within functional limits for tasks assessed Visual Recognition/Discrimination Discrimination: Within Function Limits Reading Comprehension Reading Status: Unable to assess (comment) (visual deficits, will continue to assess)    Expression Expression Primary Mode of Expression: Verbal Verbal Expression Overall Verbal Expression: Appears within functional limits for tasks assessed Written Expression Dominant Hand: Right   Oral / Motor  Oral Motor/Sensory Function Overall Oral Motor/Sensory Function: Within functional limits Motor Speech Overall Motor Speech: Appears within functional limits for tasks assessed           Tyler Lango MA, CCC-SLP  Tyler David Tyler David 01/08/2024, 1:43 PM

## 2024-01-08 NOTE — Progress Notes (Signed)
 Speech Language Pathology Treatment: Dysphagia  Patient Details Name: Tyler David MRN: [id number] DOB: 11/23/1945 Today's Date: 01/08/2024 Time: 1215-1230 SLP Time Calculation (min) (ACUTE ONLY): 15 min  Assessment / Plan / Recommendation Clinical Impression  Patient seen for f/u after MBS previous date. Patient able to self feed regular solids and thin liquids with independent use of safe swallowing strategies and overall seemingly good airway protection. One episode of throat clearing noted towards the end of his intake which according to instrumental testing is suspected to be successful at clearing mild penetrates. Overall he appears to be tolerating his diet with spouse reporting no significant s/s of aspiration. No other dysphagia needs indicated at this time.    HPI HPI: Patient is a 78 y.o. male with PMH: GERD, HLD, HTN, CKD stage III, arthritis, prostate cancer. He was recently diagnosed with CHF and placed on diuretics. He underwent CABG x3 on 01/05/24 and extubated post-surgery to BiPAP and progressed to oxygen via . Following surgery, he was noted to have right leg weakness with intact sensation. 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 MRI brain pending. He failed [facility] swallow with RN on 3/22 and was kept NPO awaiting SLP swallow evaluation.      SLP Plan   (dysphagia goals met)      Recommendations for follow up therapy are one component of a multi-disciplinary discharge planning process, led by the attending physician.  Recommendations may be updated based on patient status, additional functional criteria and insurance authorization.    Recommendations  Diet recommendations: Regular;Thin liquid Liquids provided via: Cup;Straw Medication Administration: Whole meds with puree Supervision: Patient able to self feed;Intermittent supervision to cue for compensatory strategies Compensations: Slow rate;Small sips/bites Postural Changes and/or Swallow Maneuvers: Seated  upright 90 degrees                  Oral care BID   Frequent or constant Supervision/Assistance Dysphagia, oropharyngeal phase (R13.12)      (dysphagia goals met)    Tyler Lango MA, CCC-SLP  Tyler David Tyler David  01/08/2024, 1:36 PM

## 2024-01-08 NOTE — Progress Notes (Signed)
 Stroke Expertise provided to patient. Appropriate stroke care and orders confirmed in place.  For any questions on patient's stroke care, please call:  Stroke Response (Monday - Friday 0700-1900): [phone]  [phone] [place] and Weekends): [phone]

## 2024-01-08 NOTE — Progress Notes (Signed)
 Physical Therapy Treatment Patient Details Name: Tyler David MRN: [id number] DOB: 02-Aug-1946 Today's Date: 01/08/2024   History of Present Illness 78 y.o. male [facility] for CABGx3.3/21 CT head showed small left frontal strokes. PMHx:  CKD stage IIIb, hypertension, hyperlipidemia, prostate cancer    PT Comments  Pt with significant improvement from last PT session. Pt continues with impaired proprioception, sensation, R LE and L UE weakness, delayed processing and initiation however was able to stand in the stedy and transfer to recliner this date. Pt to strongly benefit from inpatient rehab program > 3 hrs a day to address above deficits for maximal functional recovery for safe transition home with spouse. Acute PT to cont to follow.    If plan is discharge home, recommend the following: A lot of help with walking and/or transfers;A lot of help with bathing/dressing/bathroom;Assistance with cooking/housework;Assist for transportation;Help with stairs or ramp for entrance   Can travel by private vehicle        Equipment Recommendations  Rolling walker (2 wheels);BSC/3in1;Wheelchair (measurements PT);Wheelchair cushion (measurements PT);Hoyer lift    Recommendations for [facility] consult     Precautions / Restrictions Precautions Precautions: Fall;Sternal Precaution Booklet Issued: Yes (comment) Recall of Precautions/Restrictions: Impaired Precaution/Restrictions Comments: catheter Restrictions Weight Bearing Restrictions Per Provider Order:  (sternal precautions) Other Position/Activity Restrictions: sternal     Mobility  Bed Mobility Overal bed mobility: Needs Assistance Bed Mobility: Rolling, Sidelying to Sit Rolling: Max assist, +2 for physical assistance Sidelying to sit: Max assist, +2 for physical assistance       General bed mobility comments: rolled pt to get off bed pan, maxA for trunk elevation and LE management off EOB     Transfers Overall transfer level: Needs assistance Equipment used: 2 person hand held assist Transfers: Sit to/from Stand Sit to Stand: +2 physical assistance, Mod assist, From elevated surface           General transfer comment: stoodx1 with face to face transfer and use of bed pad to power up, pt with lean to the R due to R knee flexion, stoodx3 in stedy with minAx2 and tactile cues to R thigh/knee to achieve knee extension    Ambulation/Gait               General Gait Details: unable this date   Stairs             Wheelchair Mobility     Tilt Bed    Modified Rankin (Stroke Patients Only) Modified Rankin (Stroke Patients Only) Pre-Morbid Rankin Score: No symptoms Modified Rankin: Severe disability     Balance Overall balance assessment: Needs assistance, Mild deficits observed, not formally tested Sitting-balance support: Bilateral upper extremity supported, Feet supported Sitting balance-Leahy Scale: Fair Sitting balance - Comments: Able to maintain balance with L lateral lean, worked on achieving midline and maintaining upright posture Postural control: Posterior lean Standing balance support: Bilateral upper extremity supported, During functional activity, Reliant on assistive device for balance Standing balance-Leahy Scale: Zero                              Communication Communication Communication: Impaired Factors Affecting Communication: Reduced clarity of speech;Difficulty expressing self  Cognition Arousal: Lethargic Behavior During Therapy: Flat affect   PT - Cognitive impairments: No family/caregiver present to determine baseline, Sequencing, Initiation, Problem solving, Safety/Judgement  PT - Cognition Comments: pt with impaired L/R discrimination, suspect due to impaired sensation and proprioception, delayed response time, requires tactile cues to initiate task Following commands:  Impaired Following commands impaired: Follows one step commands with increased time    Cueing Cueing Techniques: Verbal cues, Tactile cues, Visual cues  Exercises      General Comments General comments (skin integrity, edema, etc.): VSS      Pertinent Vitals/Pain      Home Living     Available Help at Discharge: Family;Available 24 hours/day Type of Home: House                  Prior Function            PT Goals (current goals can now be found in the care plan section) Acute Rehab PT Goals Patient Stated Goal: to get better PT Goal Formulation: With patient Time For Goal Achievement: 01/20/24 Potential to Achieve Goals: Good Progress towards PT goals: Progressing toward goals    Frequency    Min 3X/week      PT Plan      Co-evaluation PT/OT/SLP Co-Evaluation/Treatment: Yes Reason for Co-Treatment: Complexity of the patient's impairments (multi-system involvement) PT goals addressed during session: Mobility/safety with mobility        AM-PAC PT "6 Clicks" Mobility   Outcome Measure  Help needed turning from your back to your side while in a flat bed without using bedrails?: Total Help needed moving from lying on your back to sitting on the side of a flat bed without using bedrails?: Total Help needed moving to and from a bed to a chair (including a wheelchair)?: A Lot Help needed standing up from a chair using your arms (e.g., wheelchair or bedside chair)?: A Lot Help needed to walk in hospital room?: Total Help needed climbing 3-5 steps with a railing? : Total 6 Click Score: 8    End of Session Equipment Utilized During Treatment: Oxygen Activity Tolerance: Patient limited by fatigue;Patient limited by lethargy Patient left: with call bell/phone within reach;with nursing/sitter in room;in CPM;with family/visitor present Nurse Communication: Mobility status (RN present during session) PT Visit Diagnosis: Unsteadiness on feet (R26.81);Other  abnormalities of gait and mobility (R26.89);Muscle weakness (generalized) (M62.81);Hemiplegia and hemiparesis Hemiplegia - Right/Left: Right Hemiplegia - caused by: Cerebral infarction     Time: 1205-1228 PT Time Calculation (min) (ACUTE ONLY): 23 min  Charges:    $Neuromuscular Re-education: 8-22 mins PT General Charges $$ ACUTE PT VISIT: 1 Visit                     Tyler David, PT, DPT Acute Rehabilitation Services Secure chat preferred Office #: [phone]    Tyler David 01/08/2024, 2:06 PM

## 2024-01-08 NOTE — Progress Notes (Signed)
 [address]       [place] [address]             [phone]                 4 Days Post-Op Procedure(s) (LRB): CORONARY ARTERY BYPASS GRAFTING TIMES THREE USING LEFT INTERNAL MAMMARY ARTERY AND RIGHT GREAT SAPHENOUS VEIN HARVESTED ENDOSCOPICALLY (N/A) ECHOCARDIOGRAM, TRANSESOPHAGEAL, INTRAOPERATIVE (N/A)   Events: No events Remains in afib  _______________________________________________________________ Vitals: BP (!) 139/48   Pulse (!) 57   Temp 97.8 F (36.6 C) (Axillary)   Resp 15   Ht 5\' 8"  (1.727 m)   Wt 88.1 kg   SpO2 94%   BMI 29.53 kg/m  Filed Weights   01/06/24 0327 01/07/24 0500 01/08/24 0500  Weight: 90.3 kg 89 kg 88.1 kg     - Neuro: awake  NAD.  Not moving RLE  - Cardiovascular: afib  Drips: amio 30    - Pulm: EWOB    ABG    Component Value Date/Time   PHART 7.306 (L) 01/04/2024 2032   PCO2ART 34.8 01/04/2024 2032   PO2ART 71 (L) 01/04/2024 2032   HCO3 17.3 (L) 01/04/2024 2032   TCO2 18 (L) 01/04/2024 2032   ACIDBASEDEF 8.0 (H) 01/04/2024 2032   O2SAT 75.7 01/05/2024 0830    - Abd: ND - Extremity: warm  .Intake/Output      03/23 0701 03/24 0700 03/24 0701 03/25 0700   P.O. 480    I.V. (mL/kg) 843.8 (9.6) 241.6 (2.7)   Other 90    IV Piggyback 60.4 550.4   Total Intake(mL/kg) 1474.2 (16.7) 791.9 (9)   Urine (mL/kg/hr) 1805 (0.9) 250 (0.5)   Stool 0    Chest Tube     Total Output 1805 250   Net -330.8 +541.9        Stool Occurrence 3 x       _______________________________________________________________ Labs:    Latest Ref Rng & Units 01/08/2024    2:24 AM 01/07/2024    4:23 PM 01/07/2024    8:19 AM  CBC  WBC 4.0 - 10.5 K/uL 9.1  11.3    Hemoglobin 13.0 - 17.0 g/dL 9.4  91.4  78.2   Hematocrit 39.0 - 52.0 % 29.3  31.3  33.2   Platelets 150 - 400 K/uL 152  175        Latest Ref Rng & Units 01/08/2024    2:24 AM 01/07/2024    8:19 AM 01/06/2024    5:17 AM  CMP  Glucose 70 - 99 mg/dL 956  88   213   BUN 8 - 23 mg/dL 45  38  35   Creatinine 0.61 - 1.24 mg/dL 0.86  5.78  4.69   Sodium 135 - 145 mmol/L 140  141  140   Potassium 3.5 - 5.1 mmol/L 3.6  3.8  3.6   Chloride 98 - 111 mmol/L 109  111  109   CO2 22 - 32 mmol/L 25  21  23    Calcium 8.9 - 10.3 mg/dL 7.4  7.9  7.7     CXR: PV congestion  _______________________________________________________________  Assessment and Plan: POD 4 s/p CABG, CVA  Neuro: neuro on board.  Starting eliquis today CV: on A/S/BB.  Rebolus amio.  Starting eliquis Pulm: IS, pulm hygiene Renal: creat up.  Holding diuresis GI: on diet Heme: stable ID: [email or web address] Endo: SSI Dispo: continue ICU care   Corliss Skains 01/08/2024 12:27  PM

## 2024-01-08 NOTE — Progress Notes (Signed)
 STROKE TEAM PROGRESS NOTE   INTERIM HISTORY/SUBJECTIVE  His wife is at the bedside.  Patient is awake alert sitting up in the bed in no apparent distress.  He states he has noted improvement in his left hand strength but right leg weakness persist.  He has been switched from IV heparin to Eliquis today OBJECTIVE  CBC    Component Value Date/Time   WBC 9.1 01/08/2024 0224   RBC 3.21 (L) 01/08/2024 0224   HGB 9.4 (L) 01/08/2024 0224   HGB 14.3 12/11/2023 1138   HCT 29.3 (L) 01/08/2024 0224   HCT 44.1 12/11/2023 1138   PLT 152 01/08/2024 0224   PLT 247 12/11/2023 1138   MCV 91.3 01/08/2024 0224   MCV 90 12/11/2023 1138   MCV 90 09/16/2014 0831   MCH 29.3 01/08/2024 0224   MCHC 32.1 01/08/2024 0224   RDW 13.8 01/08/2024 0224   RDW 12.1 12/11/2023 1138   RDW 12.7 09/16/2014 0831    BMET    Component Value Date/Time   NA 140 01/08/2024 0224   NA 141 12/11/2023 1138   NA 137 09/16/2014 0831   K 3.6 01/08/2024 0224   K 3.9 09/16/2014 0831   CL 109 01/08/2024 0224   CL 101 09/16/2014 0831   CO2 25 01/08/2024 0224   CO2 27 09/16/2014 0831   GLUCOSE 115 (H) 01/08/2024 0224   GLUCOSE 100 (H) 09/16/2014 0831   BUN 45 (H) 01/08/2024 0224   BUN 38 (H) 12/11/2023 1138   BUN 22 (H) 09/16/2014 0831   CREATININE 2.23 (H) 01/08/2024 0224   CREATININE 1.35 (H) 09/16/2014 0831   CALCIUM 7.4 (L) 01/08/2024 0224   CALCIUM 10.3 (H) 09/16/2014 0831   EGFR 39 (L) 12/11/2023 1138   GFRNONAA 29 (L) 01/08/2024 0224   GFRNONAA 56 (L) 09/16/2014 0831    IMAGING past 24 hours No results found.   Vitals:   01/08/24 1030 01/08/24 1033 01/08/24 1100 01/08/24 1130  BP: (!) 149/62 (!) 149/62 (!) 147/68 (!) 139/48  Pulse: 66 65 63 (!) 57  Resp: 19  15 15   Temp:   97.8 F (36.6 C)   TempSrc:   Axillary   SpO2: 95%  94% 94%  Weight:      Height:         PHYSICAL EXAM General: Pleasant elderly Caucasian male.  Psych:  Mood and affect appropriate for situation CV: Regular rate and  rhythm on monitor Respiratory:  Regular, unlabored respirations on room air GI: Abdomen soft and nontender   NEURO:  Mental Status: He is awake alert with some confusion able to state his name, not able to comment on place, has trouble following some commands.  Speech is dysarthric and slow  Cranial Nerves:  II: PERRL. Visual fields full.  III, IV, VI: Eyes are midline, does not really follow commands completely so unable to test for movements where he is not tracking V: Sensation is intact to light touch and symmetrical to face.  VII: Left facial droop VIII: hearing intact to voice. IX, X: Palate elevates symmetrically. Phonation is normal.  YN:WGNFAOZH shrug 5/5. XII: tongue is midline without fasciculations. Motor: Left arm weakness with drift 3/5, left leg he is able to lift off bed 4/5, right arm 4/5, right leg 1/5 and unable to lift off the bed Tone: is normal and bulk is normal Sensation- Intact to light touch bilaterally. Extinction absent to light touch to DSS.   Coordination: FTN intact bilaterally, HKS: no ataxia in  BLE.No drift.  Gait- deferred  Most Recent NIH  1a Level of Conscious.: 0 1b LOC Questions: 0 1c LOC Commands: 1 2 Best Gaze: 0 3 Visual: 0 4 Facial Palsy: 1 5a Motor Arm - left: 1 5b Motor Arm - Right: 0 6a Motor Leg - Left: 1 6b Motor Leg - Right: 3 7 Limb Ataxia: 0 8 Sensory: 0 9 Best Language: 1 10 Dysarthria: 1 11 Extinct. and Inatten.: 0 TOTAL: 9   ASSESSMENT/PLAN  Mr. JAIMON BUGAJ is a 78 y.o. male with history of HFrEF (ejection fraction of 35%), PVCs, LBBB, CAD, CKD IIIb, HTN, HLD, prior history of prostate cancer who is status post CABG x 3 on 3/20 followed by admission to the ICU for postoperative care. Yesterday, the patient was noted to have RLE weakness.     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  Etiology: Perioperative post CABG versus new onset A-fib MRI acute/subacute infarcts  in bilateral hemispheres, left ACA, parietal lobe bilaterally ACA MCA MRA no LVO Carotid Doppler ordered 2D Echo EF 40% with hypokinesis LDL 112 HgbA1c 5.4 VTE prophylaxis -heparin IV No antithrombotic prior to admission, now on heparin IV will need to transition to Eliquis when patient is able and appropriate Therapy recommendations:  Pending Disposition: Pending  Atrial fibrillation with RVR, new onset post CABG Home Meds: None Continue telemetry monitoring Currently he is on IV heparin and Amio drip Transition to Eliquis when patient is able and appropriate and cardiology is okay with it  Hypertension CHF CAD s/p CABG Home meds: Amlodipine 5 mg, metoprolol 50 mg, Entresto Stable Blood Pressure Goal: SBP less than 160   Hyperlipidemia Home meds: None,  LDL 112, goal < 70 On Zetia 10 mg Has allergies to statins Consider LEQVIO  Dysphagia Patient has post-stroke dysphagia, SLP consulted    Diet   Diet regular Room service appropriate? Yes with Assist; Fluid consistency: Thin   Advance diet as tolerated  Other Stroke Risk Factors Coronary artery disease Congestive heart failure    Other Active Problems CKD Hypothyroidism  Hospital day # 4   Patient developed confusion and weakness postcardiac surgery and also went into new onset persistent A-fib and MRI scan shows bilateral anterior and posterior circulation embolic infarcts.  Continue IV heparin and switch to Eliquis today.   Await carotid ultrasound.  Aggressive risk factor modification.  Patient is allergic to statins consider Leqvio or Repatha/Praluent as outpatient.  Mobilize out of bed.  Physical occupational speech therapy consults.  Long discussion patient and wife at the bedside and answered questions.  Greater than 50% time during this 35 minute visit was spent in counseling and coordination of care and discussion patient and wife and care team and answering questions.   Stroke team will sign off.  Kindly  call for questions  Delia Heady, MD Medical Director [facility] Pager: [phone] 01/08/2024 2:08 PM   To contact Stroke Continuity provider, please refer to [email or web address]. After hours, contact General Neurology

## 2024-01-08 NOTE — TOC Progression Note (Addendum)
 Transition of Care [facility]) - Progression Note    Patient Details  Name: Tyler David MRN: [id number] Date of Birth: Sep 08, 1946  [facility] of [facility] CM/SW Contact  Graves-Bigelow, Lamar Laundry, RN Phone Number: 01/08/2024, 2:22 PM  Clinical Narrative: Patient POD-4 CABG. Inpatient Rehab Admissions Coordinator is following the patient for a potential CIR candidate. Case Manager will continue to follow for transition of care needs as the patient progresses.   Expected Discharge Plan: IP Rehab Facility Barriers to Discharge: Continued Medical Work up  Expected Discharge Plan and Services   Discharge Planning Services: CM Consult Post Acute Care Choice: Home Health Living arrangements for the past 2 months: Single Family Home  Social Determinants of Health (SDOH) Interventions SDOH Screenings   Food Insecurity: No Food Insecurity (01/04/2024)  Housing: Low Risk  (01/04/2024)  Transportation Needs: No Transportation Needs (01/04/2024)  Utilities: Not At Risk (01/04/2024)  Social Connections: Socially Integrated (01/04/2024)  Tobacco Use: Low Risk  (01/04/2024)   Readmission Risk Interventions     No data to display

## 2024-01-08 NOTE — Plan of Care (Signed)
  Problem: Education: Goal: Knowledge of General Education information will improve Description: Including pain rating scale, medication(s)/side effects and non-pharmacologic comfort measures Outcome: Progressing   Problem: Health Behavior/Discharge Planning: Goal: Ability to manage health-related needs will improve Outcome: Progressing   Problem: Clinical Measurements: Goal: Ability to maintain clinical measurements within normal limits will improve Outcome: Progressing Goal: Will remain free from infection Outcome: Progressing Goal: Respiratory complications will improve Outcome: Progressing Goal: Cardiovascular complication will be avoided Outcome: Progressing   Problem: Activity: Goal: Risk for activity intolerance will decrease Outcome: Progressing   Problem: Nutrition: Goal: Adequate nutrition will be maintained Outcome: Progressing   Problem: Pain Managment: Goal: General experience of comfort will improve and/or be controlled Outcome: Progressing   Problem: Skin Integrity: Goal: Risk for impaired skin integrity will decrease Outcome: Progressing   Problem: Cardiac: Goal: Will achieve and/or maintain hemodynamic stability Outcome: Progressing   Problem: Skin Integrity: Goal: Wound healing without signs and symptoms of infection Outcome: Progressing   Problem: Metabolic: Goal: Ability to maintain appropriate glucose levels will improve Outcome: Progressing

## 2024-01-08 NOTE — Progress Notes (Signed)
 Inpatient Rehab Admissions Coordinator:    I met with Pt. To discuss potential CIR admit.  Appears to be progressing with PT, wife is interested and able to provide 24/7 support at d/c. I will follow for medical readiness and pursue insurance auth once ready.   Megan Salon, MS, CCC-SLP Rehab Admissions Coordinator  [phone] (celll) [phone] (office)

## 2024-01-09 DIAGNOSIS — I749 Embolism and thrombosis of unspecified artery: Secondary | ICD-10-CM

## 2024-01-09 DIAGNOSIS — R1310 Dysphagia, unspecified: Secondary | ICD-10-CM | POA: Diagnosis not present

## 2024-01-09 DIAGNOSIS — I5022 Chronic systolic (congestive) heart failure: Secondary | ICD-10-CM | POA: Diagnosis not present

## 2024-01-09 DIAGNOSIS — Z951 Presence of aortocoronary bypass graft: Secondary | ICD-10-CM | POA: Diagnosis not present

## 2024-01-09 DIAGNOSIS — I4891 Unspecified atrial fibrillation: Secondary | ICD-10-CM | POA: Diagnosis not present

## 2024-01-09 LAB — BASIC METABOLIC PANEL
Anion gap: 10 (ref 5–15)
BUN: 40 mg/dL — ABNORMAL HIGH (ref 8–23)
CO2: 21 mmol/L — ABNORMAL LOW (ref 22–32)
Calcium: 7.1 mg/dL — ABNORMAL LOW (ref 8.9–10.3)
Chloride: 104 mmol/L (ref 98–111)
Creatinine, Ser: 2.14 mg/dL — ABNORMAL HIGH (ref 0.61–1.24)
GFR, Estimated: 31 mL/min — ABNORMAL LOW (ref >60)
Glucose, Bld: 262 mg/dL — ABNORMAL HIGH (ref 70–99)
Potassium: 3.6 mmol/L (ref 3.5–5.1)
Sodium: 135 mmol/L (ref 135–145)

## 2024-01-09 LAB — CBC
HCT: 27.3 % — ABNORMAL LOW (ref 39.0–52.0)
Hemoglobin: 8.8 g/dL — ABNORMAL LOW (ref 13.0–17.0)
MCH: 29.1 pg (ref 26.0–34.0)
MCHC: 32.2 g/dL (ref 30.0–36.0)
MCV: 90.4 fL (ref 80.0–100.0)
Platelets: 177 10*3/uL (ref 150–400)
RBC: 3.02 MIL/uL — ABNORMAL LOW (ref 4.22–5.81)
RDW: 13.8 % (ref 11.5–15.5)
WBC: 7.3 10*3/uL (ref 4.0–10.5)
nRBC: 0 % (ref 0.0–0.2)

## 2024-01-09 LAB — GLUCOSE, CAPILLARY
Glucose-Capillary: 103 mg/dL — ABNORMAL HIGH (ref 70–99)
Glucose-Capillary: 106 mg/dL — ABNORMAL HIGH (ref 70–99)
Glucose-Capillary: 112 mg/dL — ABNORMAL HIGH (ref 70–99)
Glucose-Capillary: 135 mg/dL — ABNORMAL HIGH (ref 70–99)
Glucose-Capillary: 146 mg/dL — ABNORMAL HIGH (ref 70–99)
Glucose-Capillary: 85 mg/dL (ref 70–99)
Glucose-Capillary: 88 mg/dL (ref 70–99)
Glucose-Capillary: 90 mg/dL (ref 70–99)

## 2024-01-09 MED ORDER — AMIODARONE HCL 200 MG PO TABS
400.0000 mg | ORAL_TABLET | Freq: Two times a day (BID) | ORAL | Status: DC
  Administered 2024-01-09 – 2024-01-12 (×7): 400 mg via ORAL
  Filled 2024-01-09 (×7): qty 2

## 2024-01-09 MED ORDER — POTASSIUM CHLORIDE 20 MEQ PO PACK
20.0000 meq | PACK | Freq: Once | ORAL | Status: AC
  Administered 2024-01-09: 20 meq via ORAL
  Filled 2024-01-09: qty 1

## 2024-01-09 MED FILL — Calcium Chloride Inj 10%: INTRAVENOUS | Qty: 10 | Status: AC

## 2024-01-09 MED FILL — Heparin Sodium (Porcine) Inj 1000 Unit/ML: INTRAMUSCULAR | Qty: 10 | Status: AC

## 2024-01-09 MED FILL — Mannitol IV Soln 20%: INTRAVENOUS | Qty: 500 | Status: AC

## 2024-01-09 MED FILL — Electrolyte-R (PH 7.4) Solution: INTRAVENOUS | Qty: 5000 | Status: AC

## 2024-01-09 MED FILL — Sodium Bicarbonate IV Soln 8.4%: INTRAVENOUS | Qty: 50 | Status: AC

## 2024-01-09 NOTE — Plan of Care (Signed)
  Problem: Clinical Measurements: Goal: Ability to maintain clinical measurements within normal limits will improve Outcome: Progressing Goal: Will remain free from infection Outcome: Progressing Goal: Respiratory complications will improve Outcome: Progressing   Problem: Activity: Goal: Risk for activity intolerance will decrease Outcome: Progressing   Problem: Nutrition: Goal: Adequate nutrition will be maintained Outcome: Progressing   Problem: Coping: Goal: Level of anxiety will decrease Outcome: Progressing   Problem: Elimination: Goal: Will not experience complications related to bowel motility Outcome: Progressing Goal: Will not experience complications related to urinary retention Outcome: Progressing   Problem: Pain Managment: Goal: General experience of comfort will improve and/or be controlled Outcome: Progressing   Problem: Fluid Volume: Goal: Ability to maintain a balanced intake and output will improve Outcome: Progressing   Problem: Metabolic: Goal: Ability to maintain appropriate glucose levels will improve Outcome: Progressing   Problem: Nutritional: Goal: Maintenance of adequate nutrition will improve Outcome: Progressing   Problem: Tissue Perfusion: Goal: Adequacy of tissue perfusion will improve Outcome: Progressing

## 2024-01-09 NOTE — Progress Notes (Signed)
 Patient ID: Tyler David, male   DOB: 08/26/1946, 78 y.o.   MRN: [id number]  TCTS Evening Rounds:  Hemodynamically stable in sinus rhythm 60.   Sats 92% RA.  UO ok  Worked with PT today.

## 2024-01-09 NOTE — PMR Pre-admission (Signed)
 PMR Admission Coordinator Pre-Admission Assessment  Patient: Tyler David is an 77 y.o., male MRN: [id number] DOB: Jul 06, 1946 Height: 5\' 8"  (172.7 cm) Weight: 88.7 kg              Insurance Information HMO: ***    PPO: ***     PCP: ***     IPA: ***     80/20: ***     OTHER: *** PRIMARY: [employer]      Policy#: ***      Subscriber: *** CM Name: ***      Phone#: ***     Fax#: *** Pre-Cert#: ***      Employer: *** Benefits:  Phone #: ***     Name: *** Eff. Date: ***     Deduct: ***      Out of Pocket Max: ***      Life Max: ***  CIR: ***      SNF: *** Outpatient: ***     Co-Pay: *** Home Health: ***      Co-Pay: *** DME: ***     Co-Pay: *** Providers: *** SECONDARY:       Policy#:       Phone#:   Financial Counselor:       Phone#:   The "Data Collection Information Summary" for patients in Inpatient Rehabilitation Facilities with attached "Privacy Act Statement-Health Care Records" was provided and verbally reviewed with: Family  Emergency Contact Information Contact Information     Name Relation Home Work Mobile   [place] Spouse   [phone]      Other Contacts   None on File    Current Medical History  Patient Admitting Diagnosis: CVA following CABG x3 History of Present Illness:  Tyler David is a 78 y.o. male with a history of stage IIIb chronic kidney disease, hypertension, history of prostate cancer who presented to [facility] on 01/04/2024 with chest pain and shortness of breath.  Workup suggested pulmonary edema.  Echocardiogram revealed ejection fraction of 35 to 40%.  He underwent a cardiac cath which demonstrated multivessel obstructive coronary artery disease.  Patient underwent CABG x 3 the same day by Dr. Cliffton Asters.  Patient developed atrial fibrillation with rapid ventricular response the following day which was treated with amiodarone.  On 01/06/2024 patient developed right lower extremity weakness.  MRI of the brain  revealed acute/subacute nonhemorrhagic infarction throughout both cerebral hemispheres and left greater than right cerebellum.  Acute/subacute infarcts in the left ACA distribution were seen.  Additionally there are posterior medial parietal lobe infarcts bilaterally left greater than right in addition to acute nonhemorrhagic infarcts in the ACA/MCA watershed territories bilaterally.  Patient was seen by neurology.  Recommendation was to transition from IV heparin to Eliquis when appropriate.  Patient was up with therapies yesterday and was mod assist for sit to stand transfers using +2 assist.  He has been unable to walk yet.  Patient was max to total assistance for ADLs.  He has sternal precautions.  Patient lives with spouse and has family who can provide 24-hour assistance.  To have a 1 level home with level entry.  Patient was independent to modified independent prior to this admission.  Complete NIHSS TOTAL: 5 Glasgow Coma Scale Score: 15  Patient's medical record from [facility] has been reviewed by the rehabilitation admission coordinator and physician.  Past Medical History  Past Medical History:  Diagnosis Date   Arthritis    Cancer (HCC)  prostate cancer    Cardiomyopathy (HCC)    Chronic kidney disease, stage 3 (HCC)    Coronary artery disease    ED (erectile dysfunction)    GERD (gastroesophageal reflux disease)    Headache    hx of migraine-2015    Hyperlipidemia    Hypertension    Hypothyroidism    LBBB (left bundle branch block)    Migraine    Nocturia    Overactive bladder    Pneumonia    hx of at age 41     Has the patient had major surgery during 100 days prior to admission? Yes  Family History  family history includes Atrial fibrillation in his brother; COPD in his father; Heart failure in his father; Ovarian cancer in his mother; Prostate cancer in his brother.   Current Medications   Current Facility-Administered Medications:     acetaminophen (TYLENOL) tablet 1,000 mg, 1,000 mg, Oral, Q6H, 1,000 mg at 01/09/24 1140 **OR** acetaminophen (TYLENOL) 160 MG/5ML solution 1,000 mg, 1,000 mg, Per Tube, Q6H, Gold, [name], PA-C   albumin human 5 % solution 12.5 g, 250 mL, Intravenous, Q15 min PRN, Gold, [name], PA-C   amiodarone (NEXTERONE) 1.8 mg/mL load via infusion 150 mg, 150 mg, Intravenous, Once, Chestine Spore, Hayk Divis P, DO   amiodarone (PACERONE) tablet 400 mg, 400 mg, Oral, BID, Steffanie Dunn, DO, 400 mg at 01/09/24 0981   amLODipine (NORVASC) tablet 5 mg, 5 mg, Oral, Daily, Lightfoot, Harrell O, MD, 5 mg at 01/09/24 1914   apixaban (ELIQUIS) tablet 5 mg, 5 mg, Oral, BID, Lightfoot, Eliezer Lofts, MD, 5 mg at 01/09/24 7829   aspirin EC tablet 81 mg, 81 mg, Oral, Daily, 81 mg at 01/09/24 0918 **OR** aspirin chewable tablet 81 mg, 81 mg, Per Tube, Daily, Lightfoot, Harrell O, MD   bisacodyl (DULCOLAX) EC tablet 10 mg, 10 mg, Oral, Daily, 10 mg at 01/09/24 1408 **OR** bisacodyl (DULCOLAX) suppository 10 mg, 10 mg, Rectal, Daily, Gold, [name], PA-C, 10 mg at 01/07/24 5621   Chlorhexidine Gluconate Cloth 2 % PADS 6 each, 6 each, Topical, Daily, Lightfoot, Eliezer Lofts, MD, 6 each at 01/09/24 3086   docusate sodium (COLACE) capsule 200 mg, 200 mg, Oral, Daily, Gold, [name], PA-C, 200 mg at 01/09/24 1408   ezetimibe (ZETIA) tablet 10 mg, 10 mg, Oral, Daily, Chand, Sudham, MD, 10 mg at 01/09/24 0917   CBG monitoring, , , Q4H **AND** insulin aspart (novoLOG) injection 0-24 Units, 0-24 Units, Subcutaneous, Q4H, Lightfoot, Harrell O, MD, 2 Units at 01/09/24 1312   levothyroxine (SYNTHROID) tablet 50 mcg, 50 mcg, Oral, QAC breakfast, Gold, [name], PA-C, 50 mcg at 01/09/24 5784   metoprolol tartrate (LOPRESSOR) tablet 12.5 mg, 12.5 mg, Oral, BID, 12.5 mg at 01/09/24 0918 **OR** metoprolol tartrate (LOPRESSOR) 25 mg/10 mL oral suspension 12.5 mg, 12.5 mg, Per Tube, BID, Gold, [name], PA-C   metoprolol tartrate (LOPRESSOR) injection 2.5-5 mg, 2.5-5 mg,  Intravenous, Q2H PRN, Gold, [name], PA-C, 5 mg at 01/07/24 0209   morphine (PF) 2 MG/ML injection 1-4 mg, 1-4 mg, Intravenous, Q1H PRN, Gold, [name], PA-C, 2 mg at 01/05/24 0039   naloxone (NARCAN) injection 0.4 mg, 0.4 mg, Intravenous, PRN, Lightfoot, Harrell O, MD, 0.4 mg at 01/05/24 1451   ondansetron (ZOFRAN) injection 4 mg, 4 mg, Intravenous, Q6H PRN, Gold, [name], PA-C   Oral care mouth rinse, 15 mL, Mouth Rinse, PRN, Lightfoot, Harrell O, MD   oxyCODONE (Oxy IR/ROXICODONE) immediate release tablet  5-10 mg, 5-10 mg, Oral, Q3H PRN, Gold, [name], PA-C, 5 mg at 01/05/24 0415   pantoprazole (PROTONIX) EC tablet 40 mg, 40 mg, Oral, Daily, Lightfoot, Harrell O, MD, 40 mg at 01/09/24 1610  Patients Current Diet:  Diet Order             Diet regular Room service appropriate? Yes with Assist; Fluid consistency: Thin  Diet effective now                   Precautions / Restrictions Precautions Precautions: Fall, Sternal Precaution Booklet Issued: Yes (comment) Precaution/Restrictions Comments: foley catheter Restrictions Weight Bearing Restrictions Per Provider Order: No RUE Weight Bearing Per Provider Order: Non weight bearing LUE Weight Bearing Per Provider Order: Non weight bearing Other Position/Activity Restrictions: sternal   Has the patient had 2 or more falls or a fall with injury in the past year?Yes  Prior Activity Level Community (5-7x/wk): Pt. active in the community PTA  Prior Functional Level Prior Function Prior Level of Function : Independent/Modified Independent Mobility Comments: Ind w/ no AD ADLs Comments: Ind  Self Care: Did the patient need help bathing, dressing, using the toilet or eating?  Independent  Indoor Mobility: Did the patient need assistance with walking from room to room (with or without device)? Independent  Stairs: Did the patient need assistance with internal or external stairs (with or without device)? Independent  Functional  Cognition: Did the patient need help planning regular tasks such as shopping or remembering to take medications? Independent  Patient Information Are you of Hispanic, Latino/a,or Spanish origin?: A. No, not of Hispanic, Latino/a, or Spanish origin What is your race?: A. White Do you need or want an interpreter to communicate with a doctor or health care staff?: 0. No  Patient's Response To:  Health Literacy and Transportation Is the patient able to respond to health literacy and transportation needs?: Yes Health Literacy - How often do you need to have someone help you when you read instructions, pamphlets, or other written material from your doctor or pharmacy?: Never In the past 12 months, has lack of transportation kept you from medical appointments or from getting medications?: No In the past 12 months, has lack of transportation kept you from meetings, work, or from getting things needed for daily living?: No  Home Assistive Devices / Equipment Home Equipment: None  Prior Device Use: Indicate devices/aids used by the patient prior to current illness, exacerbation or injury? None of the above  Current Functional Level Cognition  Arousal/Alertness: Lethargic Overall Cognitive Status: Impaired/Different from baseline Orientation Level: Oriented X4 Attention: Sustained Sustained Attention: Impaired Sustained Attention Impairment: Functional complex Memory: Impaired Memory Impairment: Storage deficit Awareness: Appears intact Problem Solving: Appears intact Safety/Judgment: Appears intact    Extremity Assessment (includes Sensation/Coordination)  Upper Extremity Assessment: Right hand dominant, LUE deficits/detail LUE Deficits / Details: 2/5 shoulder, 3-/5 elbow, 3/5 forearm, flexes and extends digits slightly LUE Sensation: decreased proprioception LUE Coordination: decreased fine motor, decreased gross motor  Lower Extremity Assessment: Defer to PT evaluation RLE Deficits /  Details: no evidence of quad contraction or ankle DF/PF, reports decreased alertness to light touch at L4 dermatome LLE Deficits / Details: grossly 4/5, alert to light touch    ADLs  Overall ADL's : Needs assistance/impaired General ADL Comments: max to total assist for all ADLs    Mobility  Overal bed mobility: Needs Assistance Bed Mobility: Sit to Supine Rolling: Max assist, +2 for physical assistance Sidelying to sit: Max  assist, +2 for physical assistance Supine to sit: Total assist Sit to supine: Max assist, +2 for physical assistance General bed mobility comments: pt received in chair, assisted with returning to sidelying and then rolling over onto back. maxA for LE management back to bed, max verbal cues to keep UEs in front of body    Transfers  Overall transfer level: Needs assistance Equipment used: 2 person hand held assist, Ambulation equipment used Transfers: Sit to/from Stand Sit to Stand: +2 physical assistance, Mod assist, From elevated surface Transfer via Lift Equipment: Stedy General transfer comment: maxAx2 to power up from chair into stedy however modA to power up from seat of steady. PT assisted with R knee extension and provided tactile cues to quadriceps to promote sensation to promote contraction, completed 5 sit to stands    Ambulation / Gait / Stairs / Wheelchair Mobility  Ambulation/Gait General Gait Details: unable this date    Posture / Balance Dynamic Sitting Balance Sitting balance - Comments: slight L lean, unaware of midline Balance Overall balance assessment: Needs assistance, Mild deficits observed, not formally tested Sitting-balance support: Bilateral upper extremity supported, Feet supported Sitting balance-Leahy Scale: Fair Sitting balance - Comments: slight L lean, unaware of midline Postural control: Posterior lean Standing balance support: Bilateral upper extremity supported, During functional activity, Reliant on assistive device for  balance Standing balance-Leahy Scale: Zero Standing balance comment: unable to maintain balance without maxAx2    Special needs/care consideration Skin *** and Special service needs ***     Previous Home Environment (from acute therapy documentation) Living Arrangements: Spouse/significant other, Children  Lives With: Spouse Available Help at Discharge: Family, Available 24 hours/day Type of Home: House Home Layout: One level Home Access: Level entry Bathroom Shower/Tub: Health visitor: Standard Home Care Services: No  Discharge Living Setting Plans for Discharge Living Setting: House Type of Home at Discharge: House Discharge Home Layout: One level Discharge Home Access: Level entry Discharge Bathroom Shower/Tub: Walk-in shower Discharge Bathroom Toilet: Standard Discharge Bathroom Accessibility: Yes How Accessible: Accessible via wheelchair, Accessible via walker Does the patient have any problems obtaining your medications?: No  Social/Family/Support Systems Patient Roles: Spouse Contact Information: [phone] Anticipated Caregiver: Idell Pickles Ability/Limitations of Caregiver: 24/7 Caregiver Availability: 24/7 Discharge Plan Discussed with Primary Caregiver: Yes Is Caregiver In Agreement with Plan?: No Does Caregiver/Family have Issues with Lodging/Transportation while Pt is in Rehab?: No   Goals Patient/Family Goal for Rehab: supervision and min assist  with PT, supervision and min assist with OT, modified independent and supervision with SLP. Expected length of stay: 18-21 days Pt/Family Agrees to Admission and willing to participate: Yes Program Orientation Provided & Reviewed with Pt/Caregiver Including Roles  & Responsibilities: Yes   Decrease burden of Care through IP rehab admission: Not anticipated   Possible need for SNF placement upon discharge:Not anticipated   Patient Condition: {PATIENT'S CONDITION:22832}  Preadmission  Screen Completed By:  Jeronimo Greaves, CCC-SLP, 01/09/2024 3:27 PM ______________________________________________________________________   Discussed status with Dr. Marland Kitchenon***at *** and received approval for admission today.  Admission Coordinator:  Jeronimo Greaves, time***/Date***

## 2024-01-09 NOTE — Progress Notes (Signed)
 Physical Therapy Treatment Patient Details Name: Tyler David MRN: [id number] DOB: 05-09-1946 Today's Date: 01/09/2024   History of Present Illness 78 y.o. male presents to [facility] 01/04/24 for CABGx3.3/21 CT head showed small left frontal strokes. PMHx:  CKD stage IIIb, hypertension, hyperlipidemia, prostate cancer    PT Comments  Pt progressing well towards all goals. Pt more alert and significant improvement in ability to carry on conversation. Pt continues to have over delayed response time, impaired L/R discrepancy, impaired problem solving and sequencing. Worked on sit to stands and standing in steady to promote WBing through R LE to provide proprioception and sensory input in hopes to engage muscle contraction. With max support at R knee pt able to lift up L foot x 5 reps. Pt much improved with sit to stand form stedy seat with minimal use of UEs as pt under sternal precautions. Pt remains an excellent candidate for aggressive inpatient rehab program to address above deficits and achieve safe level of function to return home with spouse. Acute PT to cont to follow.   If plan is discharge home, recommend the following: A lot of help with walking and/or transfers;A lot of help with bathing/dressing/bathroom;Assistance with cooking/housework;Assist for transportation;Help with stairs or ramp for entrance   Can travel by private vehicle        Equipment Recommendations  Rolling walker (2 wheels);BSC/3in1;Wheelchair (measurements PT);Wheelchair cushion (measurements PT);Hoyer lift    Recommendations for [employer] [facility] consult     Precautions / Restrictions Precautions Precautions: Fall;Sternal Precaution Booklet Issued: Yes (comment) Recall of Precautions/Restrictions: Impaired Precaution/Restrictions Comments: foley catheter Restrictions Weight Bearing Restrictions Per Provider Order: No Other Position/Activity Restrictions: sternal     Mobility  Bed Mobility Overal bed  mobility: Needs Assistance Bed Mobility: Sit to Supine       Sit to supine: Max assist, +2 for physical assistance   General bed mobility comments: pt received in chair, assisted with returning to sidelying and then rolling over onto back. maxA for LE management back to bed, max verbal cues to keep UEs in front of body    Transfers Overall transfer level: Needs assistance Equipment used: 2 person hand held assist, Ambulation equipment used Transfers: Sit to/from Stand Sit to Stand: +2 physical assistance, Mod assist, From elevated surface           General transfer comment: maxAx2 to power up from chair into stedy however modA to power up from seat of steady. PT assisted with R knee extension and provided tactile cues to quadriceps to promote sensation to promote contraction, completed 5 sit to stands Transfer via Lift Equipment: Stedy  Ambulation/Gait               General Gait Details: unable this date   Stairs             Wheelchair Mobility     Tilt Bed    Modified Rankin (Stroke Patients Only) Modified Rankin (Stroke Patients Only) Pre-Morbid Rankin Score: No symptoms Modified Rankin: Severe disability     Balance Overall balance assessment: Needs assistance, Mild deficits observed, not formally tested Sitting-balance support: Bilateral upper extremity supported, Feet supported Sitting balance-Leahy Scale: Fair     Standing balance support: Bilateral upper extremity supported, During functional activity, Reliant on assistive device for balance Standing balance-Leahy Scale: Zero Standing balance comment: unable to maintain balance without maxAx2  Communication Communication Communication: Impaired Factors Affecting Communication: Reduced clarity of speech;Difficulty expressing self  Cognition Arousal: Alert Behavior During Therapy: WFL for tasks assessed/performed   PT - Cognitive impairments: Sequencing,  Problem solving, Safety/Judgement                       PT - Cognition Comments: more alert and able to conversate with PT and therapy tech. pt continues with impaired L/R discrimination, suspect due to impaired sensation and proprioception, delayed response time Following commands: Impaired Following commands impaired: Follows one step commands with increased time    Cueing Cueing Techniques: Verbal cues, Tactile cues, Visual cues  Exercises      General Comments General comments (skin integrity, edema, etc.): VSS      Pertinent Vitals/Pain Pain Assessment Pain Assessment: No/denies pain    Home Living                          Prior Function            PT Goals (current goals can now be found in the care plan section) Acute Rehab PT Goals Patient Stated Goal: to get better PT Goal Formulation: With patient Time For Goal Achievement: 01/20/24 Potential to Achieve Goals: Good Progress towards PT goals: Progressing toward goals    Frequency    Min 3X/week      PT Plan      Co-evaluation              AM-PAC PT "6 Clicks" Mobility   Outcome Measure  Help needed turning from your back to your side while in a flat bed without using bedrails?: Total Help needed moving from lying on your back to sitting on the side of a flat bed without using bedrails?: Total Help needed moving to and from a bed to a chair (including a wheelchair)?: A Lot Help needed standing up from a chair using your arms (e.g., wheelchair or bedside chair)?: A Lot Help needed to walk in hospital room?: Total Help needed climbing 3-5 steps with a railing? : Total 6 Click Score: 8    End of Session Equipment Utilized During Treatment: Oxygen Activity Tolerance: Patient tolerated treatment well Patient left: with call bell/phone within reach;with family/visitor present;in bed Nurse Communication: Mobility status PT Visit Diagnosis: Unsteadiness on feet (R26.81);Other  abnormalities of gait and mobility (R26.89);Muscle weakness (generalized) (M62.81);Hemiplegia and hemiparesis Hemiplegia - Right/Left: Right Hemiplegia - caused by: Cerebral infarction     Time: 4098-1191 PT Time Calculation (min) (ACUTE ONLY): 33 min  Charges:    $Therapeutic Exercise: 8-22 mins $Neuromuscular Re-education: 8-22 mins PT General Charges $$ ACUTE PT VISIT: 1 Visit                     Lewis Shock, PT, DPT Acute Rehabilitation Services Secure chat preferred Office #: [phone]    Iona Hansen 01/09/2024, 12:02 PM

## 2024-01-09 NOTE — Progress Notes (Signed)
 NAME:  Tyler David, MRN:  [id number], DOB:  Jan 29, 1946, LOS: 5 ADMISSION DATE:  01/04/2024, CONSULTATION DATE:  01/04/2024 REFERRING MD: Dr. Cliffton Asters, CHIEF COMPLAINT: Status post CABG  History of Present Illness:  78 year old male with CKD stage IIIb, hypertension, hyperlipidemia, prior history of prostate cancer who initially presented with chest pain, shortness of breath and x-ray chest was suggestive of pulmonary edema.  He underwent echocardiogram which showed EF 35-40%.  Underwent cardiac cath and was diagnosed with multivessel obstructive coronary artery disease.  Patient underwent CABG x 3 today, remained on dobutamine, intubated and was transferred to ICU.  PCCM was consulted for help evaluation medical management  Pertinent  Medical History   Past Medical History:  Diagnosis Date   Arthritis    Cancer [facility])    prostate cancer    Cardiomyopathy (HCC)    Chronic kidney disease, stage 3 (HCC)    Coronary artery disease    ED (erectile dysfunction)    GERD (gastroesophageal reflux disease)    Headache    hx of migraine-2015    Hyperlipidemia    Hypertension    Hypothyroidism    LBBB (left bundle branch block)    Migraine    Nocturia    Overactive bladder    Pneumonia    hx of at age 43     Significant Hospital Events: Including procedures, antibiotic start and stop dates in addition to other pertinent events   3/20 CABG x 3 3/21 post-op Afib with RVR> amiodarone, then had sinus pauses once converted out 3/22 RLE weakness> L frontal ischemic stroke   Interim History / Subjective:  Remained in NSR overnight. He is frustrated with his progress.   Objective   Blood pressure (!) 130/58, pulse (!) 51, temperature 97.6 F (36.4 C), temperature source Oral, resp. rate 13, height 5\' 8"  (1.727 m), weight 88.7 kg, SpO2 97%.        Intake/Output Summary (Last 24 hours) at 01/09/2024 0717 Last data filed at 01/09/2024 0500 Gross per 24 hour  Intake 1380.9 ml  Output 850  ml  Net 530.9 ml   Filed Weights   01/07/24 0500 01/08/24 0500 01/09/24 0500  Weight: 89 kg 88.1 kg 88.7 kg    Examination: General: elderly man sitting up in bed in NAD HEENT:  [place]/AT, eyes anicteric Neuro: awake, alert, answering questions appropriately, weakly moving LUE, not able to move RLE Chest:  breathing comfortably on [place], reduced basilar breath sounds Heart: S1S2, RRR Abdomen: soft, NT Skin: warm, dry, no rashes  BUN 40 Cr 2.14 WBC 7.3 H/H 8.8/27.3 Platelets 177  Resolved Hospital Problem list   NA  Assessment & Plan:  Coronary artery disease s/p CABG x 3 Chronic HFrEF; EF 35-40% Paroxysmal A-fib with RVR -Con't aspirin and zetia, intolerant to statins -post-op pain control per protocol -transition to oral amiodarone; can remove CVC once off IV amio -tele monitoring -apixaban for stroke prophylaxis  Acute respiratory failure with hypoxia due to atelectasis -OOB mobility as able, IS -wean O2 as able to maintain SpO2> 90%  Acute multifocal left frontal stroke causing left-sided weakness, dysphagia -aspirin, zetia. Statin-intolerant.  -apixaban for stroke prophylaxis  -PT, OT, SLP. Anticipate need for CIR after discharge.   Hypertension -con;t amlodipine, metoprolol, amiodarone  AKI on CKD stage IIIb -hold additional diuresis -maintain adequate perfusion -monitor -renally dose meds, avoid nephrotoxic meds  Expected perioperative blood loss anemia -transfuse for Hb <7 or hemodynamically significant bleeding  Hypothyroidism -synthroid  Debility -PT, OT  Hyperglycemia, A1c 5.4 -SSI PRN -goal BG 140-180  Best Practice (right click and "Reselect all SmartList Selections" daily)   Diet/type: reg diet DVT prophylaxis: apixaban GI prophylaxis: N/A Lines: d/c today Foley:  d/c 3/24 Code Status:  full code Last date of multidisciplinary goals of care discussion 3/25: patient updated at bedside   Labs   CBC: Recent Labs  Lab 01/07/24 0027  01/07/24 0208 01/07/24 0819 01/07/24 1623 01/08/24 0224 01/08/24 1610 01/09/24 0456  WBC 13.2*  --   --  11.3* 9.1 11.1* 7.3  HGB 8.6* 10.5* 10.7* 10.3* 9.4* 10.2* 8.8*  HCT 27.5* 32.2* 33.2* 31.3* 29.3* 31.2* 27.3*  MCV 92.0  --   --  90.5 91.3 89.7 90.4  PLT 173 162  --  175 152 213 177    Basic Metabolic Panel: Recent Labs  Lab 01/04/24 1830 01/04/24 2032 01/05/24 0515 01/05/24 1846 01/06/24 0517 01/07/24 0819 01/08/24 0224 01/08/24 1610 01/09/24 0456  NA 141   < > 137 137 140 141 140 137 135  K 4.3   < > 3.9 4.0 3.6 3.8 3.6 4.0 3.6  CL 108  --  110 106 109 111 109 107 104  CO2 20*  --  22 22 23  21* 25 22 21*  GLUCOSE 154*  --  129* 129* 109* 88 115* 114* 262*  BUN 27*  --  27* 33* 35* 38* 45* 42* 40*  CREATININE 1.73*  --  1.97* 2.38* 2.41* 1.98* 2.23* 2.04* 2.14*  CALCIUM 8.1*  --  7.3* 8.0* 7.7* 7.9* 7.4* 7.5* 7.1*  MG 3.6*  --  2.9* 2.7*  --   --   --  3.2*  --    < > = values in this interval not displayed.   GFR: Estimated Creatinine Clearance: 30.8 mL/min (A) (by C-G formula based on SCr of 2.14 mg/dL (H)). Recent Labs  Lab 01/07/24 1623 01/08/24 0224 01/08/24 1610 01/09/24 0456  WBC 11.3* 9.1 11.1* 7.3     Steffanie Dunn, DO 01/09/24 8:02 AM  Pulmonary & Critical Care  For contact information, see Amion. If no response to pager, please call PCCM consult pager. After hours, 7PM- 7AM, please call Elink.

## 2024-01-09 NOTE — Progress Notes (Signed)
      [address]       [place] [address]             [phone]                 5 Days Post-Op Procedure(s) (LRB): CORONARY ARTERY BYPASS GRAFTING TIMES THREE USING LEFT INTERNAL MAMMARY ARTERY AND RIGHT GREAT SAPHENOUS VEIN HARVESTED ENDOSCOPICALLY (N/A) ECHOCARDIOGRAM, TRANSESOPHAGEAL, INTRAOPERATIVE (N/A)   Events: No events Back in sinus  _______________________________________________________________ Vitals: BP (!) 130/58   Pulse (!) 51   Temp 97.6 F (36.4 C) (Oral)   Resp 13   Ht 5\' 8"  (1.727 m)   Wt 88.7 kg   SpO2 97%   BMI 29.73 kg/m  Filed Weights   01/07/24 0500 01/08/24 0500 01/09/24 0500  Weight: 89 kg 88.1 kg 88.7 kg     - Neuro: awake  NAD.  Not moving RLE  - Cardiovascular: afib  Drips: amio 30    - Pulm: EWOB    ABG    Component Value Date/Time   PHART 7.306 (L) 01/04/2024 2032   PCO2ART 34.8 01/04/2024 2032   PO2ART 71 (L) 01/04/2024 2032   HCO3 17.3 (L) 01/04/2024 2032   TCO2 18 (L) 01/04/2024 2032   ACIDBASEDEF 8.0 (H) 01/04/2024 2032   O2SAT 75.7 01/05/2024 0830    - Abd: ND - Extremity: warm  .Intake/Output      03/24 0701 03/25 0700 03/25 0701 03/26 0700   P.O.     I.V. (mL/kg) 830.6 (9.4)    Other     IV Piggyback 550.4    Total Intake(mL/kg) 1380.9 (15.6)    Urine (mL/kg/hr) 850 (0.4)    Stool     Total Output 850    Net +530.9            _______________________________________________________________ Labs:    Latest Ref Rng & Units 01/09/2024    4:56 AM 01/08/2024    4:10 PM 01/08/2024    2:24 AM  CBC  WBC 4.0 - 10.5 K/uL 7.3  11.1  9.1   Hemoglobin 13.0 - 17.0 g/dL 8.8  65.7  9.4   Hematocrit 39.0 - 52.0 % 27.3  31.2  29.3   Platelets 150 - 400 K/uL 177  213  152       Latest Ref Rng & Units 01/09/2024    4:56 AM 01/08/2024    4:10 PM 01/08/2024    2:24 AM  CMP  Glucose 70 - 99 mg/dL 846  962  952   BUN 8 - 23 mg/dL 40  42  45   Creatinine 0.61 - 1.24 mg/dL 8.41  3.24  4.01    Sodium 135 - 145 mmol/L 135  137  140   Potassium 3.5 - 5.1 mmol/L 3.6  4.0  3.6   Chloride 98 - 111 mmol/L 104  107  109   CO2 22 - 32 mmol/L 21  22  25    Calcium 8.9 - 10.3 mg/dL 7.1  7.5  7.4     CXR: PV congestion  _______________________________________________________________  Assessment and Plan: POD 5 s/p CABG, CVA  Neuro: neuro on board.   CV: on A/S/BB.  On amio and eliquis Pulm: IS, pulm hygiene Renal: creat down.  Holding diuresis GI: on diet Heme: stable ID: [email or web address] Endo: SSI Dispo: possible floor   Tyler David O Breon Diss 01/09/2024 7:09 AM

## 2024-01-09 NOTE — Consult Note (Signed)
 Physical Medicine and Rehabilitation Consult Reason for Consult: Right-sided weakness, impaired functional mobility Referring Physician: Cliffton Asters   HPI: Tyler David is a 78 y.o. male with a history of stage IIIb chronic kidney disease, hypertension, history of prostate cancer who presented on 01/04/2024 with chest pain and shortness of breath.  Workup suggested pulmonary edema.  Echocardiogram revealed ejection fraction of 35 to 40%.  He underwent a cardiac cath which demonstrated multivessel obstructive coronary artery disease.  Patient underwent CABG x 3 the same day by Dr. Cliffton Asters.  Patient developed atrial fibrillation with rapid ventricular response the following day which was treated with amiodarone.  On 01/06/2024 patient developed right lower extremity weakness.  MRI of the brain revealed acute/subacute nonhemorrhagic infarction throughout both cerebral hemispheres and left greater than right cerebellum.  Acute/subacute infarcts in the left ACA distribution were seen.  Additionally there are posterior medial parietal lobe infarcts bilaterally left greater than right in addition to acute nonhemorrhagic infarcts in the ACA/MCA watershed territories bilaterally.  Patient was seen by neurology.  Recommendation was to transition from IV heparin to Eliquis when appropriate.  Patient was up with therapies yesterday and was mod assist for sit to stand transfers using +2 assist.  He has been unable to walk yet.  Patient was max to total assistance for ADLs.  He has sternal precautions.  Patient lives with spouse and has family who can provide 24-hour assistance.  To have a 1 level home with level entry.  Patient was independent to modified independent prior to this admission.   Review of Systems  Constitutional: Negative.   HENT: Negative.    Eyes:  Positive for double vision.  Respiratory:  Positive for cough.   Cardiovascular:  Positive for chest pain.  Gastrointestinal:  Negative for  nausea.  Genitourinary: Negative.   Musculoskeletal:  Positive for myalgias.  Skin: Negative.   Neurological:  Positive for focal weakness and weakness.  Psychiatric/Behavioral:  Positive for memory loss.    Past Medical History:  Diagnosis Date   Arthritis    Cancer (HCC)    prostate cancer    Cardiomyopathy (HCC)    Chronic kidney disease, stage 3 (HCC)    Coronary artery disease    ED (erectile dysfunction)    GERD (gastroesophageal reflux disease)    Headache    hx of migraine-2015    Hyperlipidemia    Hypertension    Hypothyroidism    LBBB (left bundle branch block)    Migraine    Nocturia    Overactive bladder    Pneumonia    hx of at age 72    Past Surgical History:  Procedure Laterality Date   CARPAL TUNNEL RELEASE Bilateral    CORONARY ARTERY BYPASS GRAFT N/A 01/04/2024   Procedure: CORONARY ARTERY BYPASS GRAFTING TIMES THREE USING LEFT INTERNAL MAMMARY ARTERY AND RIGHT GREAT SAPHENOUS VEIN HARVESTED ENDOSCOPICALLY;  Surgeon: Corliss Skains, MD;  Location: [facility] OR;  Service: Open Heart Surgery;  Laterality: N/A;   INTRAOPERATIVE TRANSESOPHAGEAL ECHOCARDIOGRAM N/A 01/04/2024   Procedure: ECHOCARDIOGRAM, TRANSESOPHAGEAL, INTRAOPERATIVE;  Surgeon: Corliss Skains, MD;  Location: [facility] OR;  Service: Open Heart Surgery;  Laterality: N/A;   PROSTATE SURGERY     RIGHT/LEFT HEART CATH AND CORONARY ANGIOGRAPHY Bilateral 12/15/2023   Procedure: RIGHT/LEFT HEART CATH AND CORONARY ANGIOGRAPHY;  Surgeon: Yvonne Kendall, MD;  Location: [facility] INVASIVE CV LAB;  Service: Cardiovascular;  Laterality: Bilateral;   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   Procedure: LEFT TOTAL  HIP ARTHROPLASTY ANTERIOR APPROACH;  Surgeon: Durene Romans, MD;  Location: [facility];  Service: Orthopedics;  Laterality: Left;   Family History  Problem Relation Age of Onset   Ovarian cancer Mother    Heart failure Father    COPD Father    Atrial fibrillation Brother    Prostate cancer Brother     Social History:  reports that he has never smoked. He has never used smokeless tobacco. He reports that he does not currently use alcohol. He reports that he does not use drugs. Allergies:  Allergies  Allergen Reactions   Atorvastatin     myalgias  Other Reaction(s): myalgias   Pravachol [Pravastatin]     Muscle aches   Simvastatin    Statins Other (See Comments)    Muscle pain   Penicillins Rash    Has patient had a PCN reaction causing immediate rash, facial/tongue/throat swelling, SOB or lightheadedness with hypotension: No Has patient had a PCN reaction causing severe rash involving mucus membranes or skin necrosis: No Has patient had a PCN reaction that required hospitalization No Has patient had a PCN reaction occurring within the last 10 years: No If all of the above answers are "NO", then may proceed with Cephalosporin use.    Medications Prior to Admission  Medication Sig Dispense Refill   amLODipine (NORVASC) 5 MG tablet Take 1 tablet (5 mg total) by mouth daily. 90 tablet 3   Cholecalciferol (VITAMIN D-3) 25 MCG (1000 UT) CAPS Take 1,000 Units by mouth daily.     famotidine (PEPCID) 20 MG tablet Take 20 mg by mouth daily.     furosemide (LASIX) 20 MG tablet Take 1 tablet (20 mg total) by mouth daily as needed (swelling or weight gain (2 pounds in 24 hours or 5 pounds in 1 week)).     levothyroxine (SYNTHROID) 50 MCG tablet Take 50 mcg by mouth daily before breakfast.     MAGNESIUM PO Take 420 mg by mouth daily.     metoprolol succinate (TOPROL-XL) 50 MG 24 hr tablet Take 50 mg by mouth at bedtime. Take with or immediately following a meal.     Multiple Vitamins-Minerals (ICAPS AREDS 2 PO) Take 1 capsule by mouth 2 (two) times daily.     nitroGLYCERIN (NITROSTAT) 0.4 MG SL tablet Place 1 tablet (0.4 mg total) under the tongue every 5 (five) minutes as needed for chest pain. 25 tablet PRN   Omega-3 Fatty Acids (FISH OIL) 1000 MG CAPS Take 1,000 mg by mouth daily.      sacubitril-valsartan (ENTRESTO) 49-51 MG Take 1 tablet by mouth 2 (two) times daily. 180 tablet 3   Turmeric 500 MG CAPS Take 500 mg by mouth 2 (two) times daily.      Home: Home Living Family/patient expects to be discharged to:: Private residence Living Arrangements: Spouse/significant other, Children Available Help at Discharge: Family, Available 24 hours/day Type of Home: House Home Access: Level entry Home Layout: One level Bathroom Shower/Tub:  (pt did not know) Home Equipment: None  Lives With: Spouse  Functional History: Prior Function Prior Level of Function : Independent/Modified Independent Mobility Comments: Ind w/ no AD ADLs Comments: Ind Functional Status:  Mobility: Bed Mobility Overal bed mobility: Needs Assistance Bed Mobility: Rolling, Sidelying to Sit Rolling: Max assist, +2 for physical assistance Sidelying to sit: Max assist, +2 for physical assistance Supine to sit: Total assist Sit to supine: Total assist General bed mobility comments: rolled pt to get off bed pan, maxA for trunk  elevation and LE management off EOB Transfers Overall transfer level: Needs assistance Equipment used: 2 person hand held assist, Ambulation equipment used Transfers: Sit to/from Stand Sit to Stand: +2 physical assistance, Mod assist, From elevated surface Transfer via Lift Equipment: Stedy General transfer comment: stoodx1 with face to face transfer and use of bed pad to power up, pt with lean to the R due to R knee flexion, stoodx3 in stedy with minAx2 and tactile cues to R thigh/knee to achieve knee extension Ambulation/Gait General Gait Details: unable this date    ADL: ADL Overall ADL's : Needs assistance/impaired General ADL Comments: max to total assist for all ADLs  Cognition: Cognition Overall Cognitive Status: Impaired/Different from baseline Arousal/Alertness: Lethargic Orientation Level: Oriented X4 Attention: Sustained Sustained Attention:  Impaired Sustained Attention Impairment: Functional complex Memory: Impaired Memory Impairment: Storage deficit Awareness: Appears intact Problem Solving: Appears intact Safety/Judgment: Appears intact Cognition Arousal: Lethargic Behavior During Therapy: Flat affect Overall Cognitive Status: Impaired/Different from baseline  Blood pressure (!) 165/70, pulse (!) 59, temperature (!) 97.5 F (36.4 C), temperature source Axillary, resp. rate 17, height 5\' 8"  (1.727 m), weight 88.7 kg, SpO2 93%. Physical Exam Constitutional:      General: He is not in acute distress.    Appearance: He is obese.  HENT:     Right Ear: External ear normal.     Left Ear: External ear normal.     Nose: Nose normal.     Mouth/Throat:     Mouth: Mucous membranes are moist.  Eyes:     Extraocular Movements: Extraocular movements intact.     Pupils: Pupils are equal, round, and reactive to light.  Cardiovascular:     Rate and Rhythm: Regular rhythm. Bradycardia present.  Pulmonary:     Effort: Pulmonary effort is normal.  Abdominal:     Palpations: Abdomen is soft.  Musculoskeletal:        General: Swelling present. Normal range of motion.     Cervical back: Normal range of motion.     Right lower leg: Edema present.     Left lower leg: Edema present.  Skin:    General: Skin is warm.     Comments: Chest incision CDI  Neurological:     Comments: Pt is fairly alert. Oriented to person, [place], why he's here, month/year. Normal language. Delayed with processing and problems solving. CN non-focal. MMT: RUE 4/5 prox to distal. LUE 3 to 3+/5. RLE trace HF, HAD, KE and 0/5 at ankle. LLE 3 to 3+ prox to 4/5 distally. Sensed pain in all 4 limbs. DTR's absent. No abnl resting tone.   Psychiatric:     Comments: Pt flat but cooperative.      Results for orders placed or performed during the hospital encounter of 01/04/24 (from the past 24 hours)  Glucose, capillary     Status: Abnormal   Collection  Time: 01/08/24 10:28 AM  Result Value Ref Range   Glucose-Capillary 205 (H) 70 - 99 mg/dL  Glucose, capillary     Status: Abnormal   Collection Time: 01/08/24 11:30 AM  Result Value Ref Range   Glucose-Capillary 141 (H) 70 - 99 mg/dL  Glucose, capillary     Status: Abnormal   Collection Time: 01/08/24 11:58 AM  Result Value Ref Range   Glucose-Capillary 145 (H) 70 - 99 mg/dL  Basic metabolic panel     Status: Abnormal   Collection Time: 01/08/24  4:10 PM  Result Value Ref Range   Sodium  137 135 - 145 mmol/L   Potassium 4.0 3.5 - 5.1 mmol/L   Chloride 107 98 - 111 mmol/L   CO2 22 22 - 32 mmol/L   Glucose, Bld 114 (H) 70 - 99 mg/dL   BUN 42 (H) 8 - 23 mg/dL   Creatinine, Ser 3.08 (H) 0.61 - 1.24 mg/dL   Calcium 7.5 (L) 8.9 - 10.3 mg/dL   GFR, Estimated 33 (L) >60 mL/min   Anion gap 8 5 - 15  Magnesium     Status: Abnormal   Collection Time: 01/08/24  4:10 PM  Result Value Ref Range   Magnesium 3.2 (H) 1.7 - 2.4 mg/dL  CBC     Status: Abnormal   Collection Time: 01/08/24  4:10 PM  Result Value Ref Range   WBC 11.1 (H) 4.0 - 10.5 K/uL   RBC 3.48 (L) 4.22 - 5.81 MIL/uL   Hemoglobin 10.2 (L) 13.0 - 17.0 g/dL   HCT 65.7 (L) 84.6 - 96.2 %   MCV 89.7 80.0 - 100.0 fL   MCH 29.3 26.0 - 34.0 pg   MCHC 32.7 30.0 - 36.0 g/dL   RDW 95.2 84.1 - 32.4 %   Platelets 213 150 - 400 K/uL   nRBC 0.0 0.0 - 0.2 %  Glucose, capillary     Status: Abnormal   Collection Time: 01/08/24  4:13 PM  Result Value Ref Range   Glucose-Capillary 106 (H) 70 - 99 mg/dL  Glucose, capillary     Status: Abnormal   Collection Time: 01/08/24  8:48 PM  Result Value Ref Range   Glucose-Capillary 106 (H) 70 - 99 mg/dL  Glucose, capillary     Status: Abnormal   Collection Time: 01/09/24 12:06 AM  Result Value Ref Range   Glucose-Capillary 112 (H) 70 - 99 mg/dL  Glucose, capillary     Status: Abnormal   Collection Time: 01/09/24  4:55 AM  Result Value Ref Range   Glucose-Capillary 146 (H) 70 - 99 mg/dL  CBC      Status: Abnormal   Collection Time: 01/09/24  4:56 AM  Result Value Ref Range   WBC 7.3 4.0 - 10.5 K/uL   RBC 3.02 (L) 4.22 - 5.81 MIL/uL   Hemoglobin 8.8 (L) 13.0 - 17.0 g/dL   HCT 40.1 (L) 02.7 - 25.3 %   MCV 90.4 80.0 - 100.0 fL   MCH 29.1 26.0 - 34.0 pg   MCHC 32.2 30.0 - 36.0 g/dL   RDW 66.4 40.3 - 47.4 %   Platelets 177 150 - 400 K/uL   nRBC 0.0 0.0 - 0.2 %  Basic metabolic panel     Status: Abnormal   Collection Time: 01/09/24  4:56 AM  Result Value Ref Range   Sodium 135 135 - 145 mmol/L   Potassium 3.6 3.5 - 5.1 mmol/L   Chloride 104 98 - 111 mmol/L   CO2 21 (L) 22 - 32 mmol/L   Glucose, Bld 262 (H) 70 - 99 mg/dL   BUN 40 (H) 8 - 23 mg/dL   Creatinine, Ser 2.59 (H) 0.61 - 1.24 mg/dL   Calcium 7.1 (L) 8.9 - 10.3 mg/dL   GFR, Estimated 31 (L) >60 mL/min   Anion gap 10 5 - 15  Glucose, capillary     Status: None   Collection Time: 01/09/24  6:52 AM  Result Value Ref Range   Glucose-Capillary 85 70 - 99 mg/dL   DG Swallowing Func-Speech Pathology Result Date: 01/07/2024 Table formatting from the original  result was not included. Modified Barium Swallow Study Patient Details Name: Tyler David MRN: [id number] Date of Birth: 27-Sep-1946 Today's Date: 01/07/2024 HPI/PMH: HPI: Patient is a 78 y.o. male with PMH: GERD, HLD, HTN, CKD stage III, arthritis, prostate cancer. He was recently diagnosed with CHF and placed on diuretics. He underwent CABG x3 on 01/05/24 and extubated post-surgery to BiPAP and progressed to oxygen via [place]. Following surgery, he was noted to have right leg weakness with intact sensation. 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 MRI brain pending. He failed Yale swallow with RN on 3/22 and was kept NPO awaiting SLP swallow evaluation. Clinical Impression: Pt exhibits mild oropharyngeal dysphagia characterized by deficits primarily related to timing. He achieves timely and complete oral clearance. Suspected cervical osteophytes prohibit complete epiglottic  inversion and base of tongue retraction is often incomplete, leading to vallecular residue most notably with solids and the barium tablet. Thin liquids consistently result in trace, transient penetration (PAS 2) which is considered WFL. There was one outlying incident of trace aspiration with thin liquids, which is considered to be mild in nature per the DIGEST. Pt required multiple sips of thin liquids to clear the 13 mm barium tablet from his oral cavity and then bites of puree to clear it from his valleculae. Even in this period of prolonged bolus manipulation, pt maintained complete laryngeal elevation, demonstrating excellent airway protection. Suspect most significant risk is secondary to large, uncontrolled sips and bites which may be prohibited if pt is given full supervision. Consider giving larger pills whole with puree.  Recommend diet of regular solids with thin liquids. SLP will f/u at least briefly. Given acute stroke, consider adding orders for a cognitive-linguistic evaluation. DIGEST Swallow Severity Rating*  Safety: 1  Efficiency: 1  Overall Pharyngeal Swallow Severity: 1 (mild) 1: mild; 2: moderate; 3: severe; 4: profound *The Dynamic Imaging Grade of Swallowing Toxicity is standardized for the head and neck cancer population, however, demonstrates promising clinical applications across populations to standardize the clinical rating of pharyngeal swallow safety and severity. Factors that may increase risk of adverse event in presence of aspiration Rubye Oaks & Clearance Coots 2021): Factors that may increase risk of adverse event in presence of aspiration Rubye Oaks & Clearance Coots 2021): Limited mobility Recommendations/Plan: Swallowing Evaluation Recommendations Swallowing Evaluation Recommendations Recommendations: PO diet PO Diet Recommendation: Regular; Thin liquids (Level 0) Liquid Administration via: Cup; Straw Medication Administration: Whole meds with puree Supervision: Staff to assist with self-feeding;  Full supervision/cueing for swallowing strategies Swallowing strategies  : Minimize environmental distractions; Slow rate; Small bites/sips; Multiple dry swallows after each bite/sip Postural changes: Position pt fully upright for meals Oral care recommendations: Oral care BID (2x/day) Treatment Plan Treatment Plan Treatment recommendations: Therapy as outlined in treatment plan below Follow-up recommendations: Acute inpatient rehab (3 hours/day) Functional status assessment: Patient has had a recent decline in their functional status and demonstrates the ability to make significant improvements in function in a reasonable and predictable amount of time. Treatment frequency: Min 2x/week Treatment duration: 1 week Interventions: Aspiration precaution training; Patient/family education; Trials of upgraded texture/liquids; Diet toleration management by SLP Recommendations Recommendations for follow up therapy are one component of a multi-disciplinary discharge planning process, led by the attending physician.  Recommendations may be updated based on patient status, additional functional criteria and insurance authorization. Assessment: Orofacial Exam: Orofacial Exam Oral Cavity: Oral Hygiene: WFL Oral Cavity - Dentition: Adequate natural dentition Orofacial Anatomy: WFL Oral Motor/Sensory Function: WFL Anatomy: Anatomy: Suspected cervical osteophytes Boluses Administered: Boluses  Administered Boluses Administered: Thin liquids (Level 0); Mildly thick liquids (Level 2, nectar thick); Moderately thick liquids (Level 3, honey thick); Puree; Solid  Oral Impairment Domain: Oral Impairment Domain Lip Closure: No labial escape Tongue control during bolus hold: Posterior escape of less than half of bolus Bolus preparation/mastication: Timely and efficient chewing and mashing Bolus transport/lingual motion: Brisk tongue motion Oral residue: Complete oral clearance Location of oral residue : N/A Initiation of pharyngeal swallow  : Pyriform sinuses  Pharyngeal Impairment Domain: Pharyngeal Impairment Domain Soft palate elevation: No bolus between soft palate (SP)/pharyngeal wall (PW) Laryngeal elevation: Complete superior movement of thyroid cartilage with complete approximation of arytenoids to epiglottic petiole Anterior hyoid excursion: Complete anterior movement Epiglottic movement: Complete inversion Laryngeal vestibule closure: Complete, no air/contrast in laryngeal vestibule Pharyngeal stripping wave : Present - complete Pharyngeal contraction (A/P view only): N/A Pharyngoesophageal segment opening: Complete distension and complete duration, no obstruction of flow Tongue base retraction: Trace column of contrast or air between tongue base and PPW Pharyngeal residue: Collection of residue within or on pharyngeal structures Location of pharyngeal residue: Tongue base; Valleculae  Esophageal Impairment Domain: Esophageal Impairment Domain Esophageal clearance upright position: Complete clearance, esophageal coating Pill: Pill Consistency administered: Thin liquids (Level 0) Thin liquids (Level 0): Impaired (see clinical impressions) Penetration/Aspiration Scale Score: No data recorded Compensatory Strategies: Compensatory Strategies Compensatory strategies: Yes Straw: Effective Effective Straw: Thin liquid (Level 0); Mildly thick liquid (Level 2, nectar thick) Multiple swallows: Effective Effective Multiple Swallows: Puree; Solid; Pill   General Information: Caregiver present: Yes  Diet Prior to this Study: NPO   Temperature : Normal   Respiratory Status: WFL   Supplemental O2: Nasal cannula   History of Recent Intubation: Yes  Behavior/Cognition: Alert; Cooperative; Requires cueing Self-Feeding Abilities: Needs assist with self-feeding Baseline vocal quality/speech: Normal Volitional Cough: Able to elicit Volitional Swallow: Able to elicit Exam Limitations: No limitations Goal Planning: Prognosis for improved oropharyngeal function:  Good Barriers to Reach Goals: Cognitive deficits; Time post onset No data recorded Patient/Family Stated Goal: no family present, patient did not state Consulted and agree with results and recommendations: Patient; Nurse Pain: Pain Assessment Pain Assessment: Faces Pain Score: 0 Faces Pain Scale: 0 Pain Location: generalized with movement Pain Descriptors / Indicators: Grimacing; Discomfort Pain Intervention(s): Monitored during session End of Session: Start Time:SLP Start Time (ACUTE ONLY): 1050 Stop Time: SLP Stop Time (ACUTE ONLY): 1111 Time Calculation:SLP Time Calculation (min) (ACUTE ONLY): 21 min Charges: SLP Evaluations $ SLP Speech Visit: 1 Visit SLP Evaluations $BSS Swallow: 1 Procedure $MBS Swallow: 1 Procedure $Swallowing Treatment: 1 Procedure SLP visit diagnosis: SLP Visit Diagnosis: Dysphagia, oropharyngeal phase (R13.12) Past Medical History: Past Medical History: Diagnosis Date  Arthritis   Cancer (HCC)   prostate cancer   Cardiomyopathy (HCC)   Chronic kidney disease, stage 3 (HCC)   Coronary artery disease   ED (erectile dysfunction)   GERD (gastroesophageal reflux disease)   Headache   hx of migraine-2015   Hyperlipidemia   Hypertension   Hypothyroidism   LBBB (left bundle branch block)   Migraine   Nocturia   Overactive bladder   Pneumonia   hx of at age 55  Past Surgical History: Past Surgical History: Procedure Laterality Date  CARPAL TUNNEL RELEASE Bilateral   CORONARY ARTERY BYPASS GRAFT N/A 01/04/2024  Procedure: CORONARY ARTERY BYPASS GRAFTING TIMES THREE USING LEFT INTERNAL MAMMARY ARTERY AND RIGHT GREAT SAPHENOUS VEIN HARVESTED ENDOSCOPICALLY;  Surgeon: Corliss Skains, MD;  Location: [facility];  Service: Open  Heart Surgery;  Laterality: N/A;  INTRAOPERATIVE TRANSESOPHAGEAL ECHOCARDIOGRAM N/A 01/04/2024  Procedure: ECHOCARDIOGRAM, TRANSESOPHAGEAL, INTRAOPERATIVE;  Surgeon: Corliss Skains, MD;  Location: [facility];  Service: Open Heart Surgery;  Laterality: N/A;  PROSTATE SURGERY     RIGHT/LEFT HEART CATH AND CORONARY ANGIOGRAPHY Bilateral 12/15/2023  Procedure: RIGHT/LEFT HEART CATH AND CORONARY ANGIOGRAPHY;  Surgeon: Yvonne Kendall, MD;  Location: [facility] INV[facility];  Service: Cardiovascular;  Laterality: Bilateral;  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  Procedure: LEFT TOTAL HIP ARTHROPLASTY ANTERIOR APPROACH;  Surgeon: Durene Romans, MD;  Location: [facility];  Service: Orthopedics;  Laterality: Left; Gwynneth Aliment, M.A., CF-SLP Speech Language Pathology, Acute Rehabilitation Services Secure Chat preferred [phone] 01/07/2024, 11:38 AM   Assessment/Plan: Diagnosis: 79 year old male with 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 +sternal precautions Does the need for close, 24 hr/day medical supervision in concert with the patient's rehab needs make it unreasonable for this patient to be served in a less intensive setting? Yes Co-Morbidities requiring supervision/potential complications:  -Paroxysmal atrial fibrillation with recent RVR -Acute respiratory failure with hypoxia -Hypertension -Acute on chronic stage IIIb kidney disease -Postoperative anemia Due to bladder management, bowel management, safety, skin/wound care, disease management, medication administration, pain management, and patient education, does the patient require 24 hr/day rehab nursing? Yes Does the patient require coordinated care of a physician, rehab nurse, therapy disciplines of PT, OT, SLP to address physical and functional deficits in the context of the above medical diagnosis(es)? Yes Addressing deficits in the following areas: balance, endurance, locomotion, strength, transferring, bowel/bladder control, bathing, dressing, feeding, grooming, toileting, cognition, and psychosocial support Can the patient actively participate in an intensive therapy program of at least 3 hrs of therapy per day at least 5 days per week? Yes The potential for patient to make  measurable gains while on inpatient rehab is excellent Anticipated functional outcomes upon discharge from inpatient rehab are supervision and min assist  with PT, supervision and min assist with OT, modified independent and supervision with SLP. Estimated rehab length of stay to reach the above functional goals is: 15-20 days Anticipated discharge destination: Home Overall Rehab/Functional Prognosis: excellent  POST ACUTE RECOMMENDATIONS: This patient's condition is appropriate for continued rehabilitative care in the following setting: CIR Patient has agreed to participate in recommended program. Yes Note that insurance prior authorization may be required for reimbursement for recommended care.  Comment: Pt was independent prior to admit. Wife is at home and provide assist. Pt is motivated to regain functional independence. Rehab Admissions Coordinator to follow up.      I have personally performed a face to face diagnostic evaluation of this patient. Additionally, I have examined the patient's medical record including any pertinent labs and radiographic images.    Thanks,  Ranelle Oyster, MD 01/09/2024

## 2024-01-09 NOTE — Progress Notes (Signed)
 Inpatient Rehab Admissions Coordinator:    CIR following. Pt. Did well with therapy yesterday. He's not yet medically ready for CIR but I will continue to follow and submit to insurance once medically stable. Megan Salon, MS, CCC-SLP Rehab Admissions Coordinator  [phone] (celll) [phone] (office)

## 2024-01-10 ENCOUNTER — Encounter (HOSPITAL_COMMUNITY)

## 2024-01-10 DIAGNOSIS — Z951 Presence of aortocoronary bypass graft: Secondary | ICD-10-CM | POA: Diagnosis not present

## 2024-01-10 DIAGNOSIS — R1310 Dysphagia, unspecified: Secondary | ICD-10-CM | POA: Diagnosis not present

## 2024-01-10 DIAGNOSIS — I5022 Chronic systolic (congestive) heart failure: Secondary | ICD-10-CM | POA: Diagnosis not present

## 2024-01-10 DIAGNOSIS — I4891 Unspecified atrial fibrillation: Secondary | ICD-10-CM | POA: Diagnosis not present

## 2024-01-10 LAB — GLUCOSE, CAPILLARY
Glucose-Capillary: 85 mg/dL (ref 70–99)
Glucose-Capillary: 82 mg/dL (ref 70–99)
Glucose-Capillary: 143 mg/dL — ABNORMAL HIGH (ref 70–99)
Glucose-Capillary: 100 mg/dL — ABNORMAL HIGH (ref 70–99)
Glucose-Capillary: 116 mg/dL — ABNORMAL HIGH (ref 70–99)
Glucose-Capillary: 116 mg/dL — ABNORMAL HIGH (ref 70–99)

## 2024-01-10 LAB — BASIC METABOLIC PANEL
Anion gap: 9 (ref 5–15)
BUN: 41 mg/dL — ABNORMAL HIGH (ref 8–23)
CO2: 21 mmol/L — ABNORMAL LOW (ref 22–32)
Calcium: 7.7 mg/dL — ABNORMAL LOW (ref 8.9–10.3)
Chloride: 109 mmol/L (ref 98–111)
Creatinine, Ser: 2.18 mg/dL — ABNORMAL HIGH (ref 0.61–1.24)
GFR, Estimated: 30 mL/min — ABNORMAL LOW (ref >60)
Glucose, Bld: 91 mg/dL (ref 70–99)
Potassium: 4 mmol/L (ref 3.5–5.1)
Sodium: 139 mmol/L (ref 135–145)

## 2024-01-10 LAB — CBC
HCT: 29 % — ABNORMAL LOW (ref 39.0–52.0)
Hemoglobin: 9.5 g/dL — ABNORMAL LOW (ref 13.0–17.0)
MCH: 29 pg (ref 26.0–34.0)
MCHC: 32.8 g/dL (ref 30.0–36.0)
MCV: 88.4 fL (ref 80.0–100.0)
Platelets: 198 10*3/uL (ref 150–400)
RBC: 3.28 MIL/uL — ABNORMAL LOW (ref 4.22–5.81)
RDW: 13.8 % (ref 11.5–15.5)
WBC: 7.3 10*3/uL (ref 4.0–10.5)
nRBC: 0 % (ref 0.0–0.2)

## 2024-01-10 MED ORDER — ~~LOC~~ CARDIAC SURGERY, PATIENT & FAMILY EDUCATION: Freq: Once | Status: AC

## 2024-01-10 MED ORDER — SODIUM CHLORIDE 0.9% FLUSH
3.0000 mL | Freq: Two times a day (BID) | INTRAVENOUS | Status: DC
  Administered 2024-01-10 – 2024-01-11 (×2): 3 mL via INTRAVENOUS

## 2024-01-10 MED ORDER — ENSURE ENLIVE PO LIQD: 237.0000 mL | Freq: Two times a day (BID) | ORAL | Status: DC

## 2024-01-10 MED ORDER — SODIUM CHLORIDE 0.9% FLUSH
3.0000 mL | INTRAVENOUS | Status: DC | PRN
  Administered 2024-01-10: 3 mL via INTRAVENOUS

## 2024-01-10 MED ORDER — SODIUM CHLORIDE 0.9 % IV SOLN: 250.0000 mL | INTRAVENOUS | Status: AC | PRN

## 2024-01-10 NOTE — Progress Notes (Signed)
 Initial Nutrition Assessment  DOCUMENTATION CODES:   Not applicable  INTERVENTION:   Continue Regular Diet, encouraged family to bring in food for pt it pt prefers.   Feeding assistance at meal times with bilateral   Ensure Enlive po BID, each supplement provides 350 kcal and 20 grams of protein. Family brought in Warm [place] Protein Shake from home, ok for pt to use this as well   NUTRITION DIAGNOSIS:   Inadequate oral intake related to acute illness, poor appetite as evidenced by per patient/family report.  Being addressed   GOAL:   Patient will meet greater than or equal to 90% of their needs  Progressing  MONITOR:   PO intake, Supplement acceptance, Labs, Weight trends  REASON FOR ASSESSMENT:   Consult Assessment of nutrition requirement/status  ASSESSMENT:   78 yo male post CABG x 3 on 3/20, post op had acute left frontal stroke, AKI on CKD 3. PMH includes CKD3b, HTN  3/20 CABG x 3 3/22 L frontal ischemic stroke  Pt working with PT on visit today but able to talk to pt's wife. Pt has experienced poor appetite with altered taste/smell post op. Recorded po intake poor (0-10% of meals) until today when po 75-100% of meals. Wife does report pt slowly starting to eat a bit more.   Prior to admission, pt eating well per family.   Weight relatively stable.   Labs: BUN 41 Creatinine 2.18 Sodium 139 (wdl) Potassium 4.0 (wdl) CBGs 82-143  Meds:  Dulcolax suppository Colace SS novolog Protonix   NUTRITION - FOCUSED PHYSICAL EXAM:  Unable to assess  Diet Order:   Diet Order             Diet regular Room service appropriate? Yes with Assist; Fluid consistency: Thin  Diet effective now                   EDUCATION NEEDS:   Education needs have been addressed  Skin:  Skin Assessment: Skin Integrity Issues: Skin Integrity Issues:: Incisions Incisions: chest (closed)  Last BM:  3/26 type 5 large BM  Height:   Ht Readings from Last 1  Encounters:  01/04/24 5\' 8"  (1.727 m)    Weight:   Wt Readings from Last 1 Encounters:  01/10/24 88.9 kg     BMI:  Body mass index is 29.8 kg/m.  Estimated Nutritional Needs:   Kcal:  1950-2150 kcals  Protein:  100-115 g  Fluid:  >/= 1.9 L  Romelle Starcher MS, RDN, LDN, CNSC Registered Dietitian 3 Clinical Nutrition RD Inpatient Contact Info in Amion

## 2024-01-10 NOTE — Progress Notes (Signed)
 NAME:  Tyler David, MRN:  [id number], DOB:  1946-04-13, LOS: 6 ADMISSION DATE:  01/04/2024, CONSULTATION DATE:  01/04/2024 REFERRING MD: Dr. Cliffton Asters, CHIEF COMPLAINT: Status post CABG  History of Present Illness:  78 year old male with CKD stage IIIb, hypertension, hyperlipidemia, prior history of prostate cancer who initially presented with chest pain, shortness of breath and x-ray chest was suggestive of pulmonary edema.  He underwent echocardiogram which showed EF 35-40%.  Underwent cardiac cath and was diagnosed with multivessel obstructive coronary artery disease.  Patient underwent CABG x 3 today, remained on dobutamine, intubated and was transferred to ICU.  PCCM was consulted for help evaluation medical management  Pertinent  Medical History   Past Medical History:  Diagnosis Date   Arthritis    Cancer [facility])    prostate cancer    Cardiomyopathy (HCC)    Chronic kidney disease, stage 3 (HCC)    Coronary artery disease    ED (erectile dysfunction)    GERD (gastroesophageal reflux disease)    Headache    hx of migraine-2015    Hyperlipidemia    Hypertension    Hypothyroidism    LBBB (left bundle branch block)    Migraine    Nocturia    Overactive bladder    Pneumonia    hx of at age 59     Significant Hospital Events: Including procedures, antibiotic start and stop dates in addition to other pertinent events   3/20 CABG x 3 3/21 post-op Afib with RVR> amiodarone, then had sinus pauses once converted out 3/22 RLE weakness> L frontal ischemic stroke   Interim History / Subjective:  Frustrated, but feeling ok.  Objective   Blood pressure (!) 147/61, pulse 67, temperature 97.9 F (36.6 C), temperature source Oral, resp. rate 17, height 5\' 8"  (1.727 m), weight 88.9 kg, SpO2 94%.        Intake/Output Summary (Last 24 hours) at 01/10/2024 0856 Last data filed at 01/10/2024 0400 Gross per 24 hour  Intake 153.47 ml  Output 1000 ml  Net -846.53 ml   Filed Weights    01/08/24 0500 01/09/24 0500 01/10/24 0500  Weight: 88.1 kg 88.7 kg 88.9 kg    Examination: General: elderly man sitting up in the chair in NAD HEENT:  [place]/AT, eyes anicteric Neuro: awake, alert, moving R UE, LLE. LUE able to lift overhad today, wiggled toes on R foot in response to tickling his foot. Answering questions appropriately Chest:  breathing comfortably on RA CTAB Heart: S1S2, bradycardic & reg rhythm, sinus brady on tele Abdomen: soft, NT Skin: warm, dry, no rashes  BUN 41 Cr 2.18 WBC 7.3 H/H  9.5/29 Platelets 198  BG all <105  Resolved Hospital Problem list   NA  Assessment & Plan:  Coronary artery disease s/p CABG x 3 Chronic HFrEF; EF 35-40% Paroxysmal A-fib with RVR -zetia & aspirin; statin-intolerant -post-op pain control per protocol -con't enteral amiodarone load -apixaban for CVA prophylaxis  Acute respiratory failure with hypoxia due to atelectasis- resolved -IS, OOB moblity  Acute multifocal left frontal stroke causing left-sided weakness, dysphagia -con't aspirin & zetia; intolerant to statins -apixaban for stroke prophylaxis -con't working with PT, OT, SLP. Needs CIR after discharge    Hypertension -con't amiodarone, metoprolol, amlodipine.  AKI on CKD stage IIIb -strict I/O -renally dose meds, avoid nephrotoxic meds -maintain adequate perfusion  Expected perioperative blood loss anemia -transfuse for Hb <7 or hemodynamically significant bleeding  Hypothyroidism -con't synthroid  Debility -PT, OT  Hyperglycemia, A1c 5.4 -  SSI PRN -goal BG 140-180  Stable to transfer tot he floor today. PCCM will be available as needed. Please call with questions.  Best Practice (right click and "Reselect all SmartList Selections" daily)   Diet/type: reg diet DVT prophylaxis: apixaban GI prophylaxis: N/A Lines: d/c 3/25 Foley:  d/c 3/24 Code Status:  full code Last date of multidisciplinary goals of care discussion 3/25: patient updated at  bedside   Labs   CBC: Recent Labs  Lab 01/07/24 1623 01/08/24 0224 01/08/24 1610 01/09/24 0456 01/10/24 0253  WBC 11.3* 9.1 11.1* 7.3 7.3  HGB 10.3* 9.4* 10.2* 8.8* 9.5*  HCT 31.3* 29.3* 31.2* 27.3* 29.0*  MCV 90.5 91.3 89.7 90.4 88.4  PLT 175 152 213 177 198    Basic Metabolic Panel: Recent Labs  Lab 01/04/24 1830 01/04/24 2032 01/05/24 0515 01/05/24 1846 01/06/24 0517 01/07/24 0819 01/08/24 0224 01/08/24 1610 01/09/24 0456 01/10/24 0253  NA 141   < > 137 137   < > 141 140 137 135 139  K 4.3   < > 3.9 4.0   < > 3.8 3.6 4.0 3.6 4.0  CL 108  --  110 106   < > 111 109 107 104 109  CO2 20*  --  22 22   < > 21* 25 22 21* 21*  GLUCOSE 154*  --  129* 129*   < > 88 115* 114* 262* 91  BUN 27*  --  27* 33*   < > 38* 45* 42* 40* 41*  CREATININE 1.73*  --  1.97* 2.38*   < > 1.98* 2.23* 2.04* 2.14* 2.18*  CALCIUM 8.1*  --  7.3* 8.0*   < > 7.9* 7.4* 7.5* 7.1* 7.7*  MG 3.6*  --  2.9* 2.7*  --   --   --  3.2*  --   --    < > = values in this interval not displayed.   GFR: Estimated Creatinine Clearance: 30.3 mL/min (A) (by C-G formula based on SCr of 2.18 mg/dL (H)). Recent Labs  Lab 01/08/24 0224 01/08/24 1610 01/09/24 0456 01/10/24 0253  WBC 9.1 11.1* 7.3 7.3     Steffanie Dunn, DO 01/10/24 6:25 PM [facility]  For contact information, see Amion. If no response to pager, please call PCCM consult pager. After hours, 7PM- 7AM, please call Elink.

## 2024-01-10 NOTE — Plan of Care (Signed)
  Problem: Activity: Goal: Risk for activity intolerance will decrease Outcome: Progressing   Problem: Clinical Measurements: Goal: Postoperative complications will be avoided or minimized Outcome: Progressing   Problem: Respiratory: Goal: Respiratory status will improve Outcome: Progressing   Problem: Urinary Elimination: Goal: Ability to achieve and maintain adequate renal perfusion and functioning will improve Outcome: Progressing   Problem: Coping: Goal: Ability to adjust to condition or change in health will improve Outcome: Progressing   Problem: Fluid Volume: Goal: Ability to maintain a balanced intake and output will improve Outcome: Progressing   Problem: Health Behavior/Discharge Planning: Goal: Ability to manage health-related needs will improve Outcome: Progressing   Problem: Metabolic: Goal: Ability to maintain appropriate glucose levels will improve Outcome: Progressing   Problem: Nutritional: Goal: Maintenance of adequate nutrition will improve Outcome: Progressing   Problem: Tissue Perfusion: Goal: Adequacy of tissue perfusion will improve Outcome: Progressing

## 2024-01-10 NOTE — Progress Notes (Signed)
      [address]       [place] [address]             [phone]                 6 Days Post-Op Procedure(s) (LRB): CORONARY ARTERY BYPASS GRAFTING TIMES THREE USING LEFT INTERNAL MAMMARY ARTERY AND RIGHT GREAT SAPHENOUS VEIN HARVESTED ENDOSCOPICALLY (N/A) ECHOCARDIOGRAM, TRANSESOPHAGEAL, INTRAOPERATIVE (N/A)   Events: No events Back in sinus  _______________________________________________________________ Vitals: BP (!) 147/61   Pulse 67   Temp 97.9 F (36.6 C) (Oral)   Resp 17   Ht 5\' 8"  (1.727 m)   Wt 88.9 kg   SpO2 94%   BMI 29.80 kg/m  Filed Weights   01/08/24 0500 01/09/24 0500 01/10/24 0500  Weight: 88.1 kg 88.7 kg 88.9 kg     - Neuro: awake  NAD.  Not moving RLE  - Cardiovascular: afib  Drips: amio 30    - Pulm: EWOB    ABG    Component Value Date/Time   PHART 7.306 (L) 01/04/2024 2032   PCO2ART 34.8 01/04/2024 2032   PO2ART 71 (L) 01/04/2024 2032   HCO3 17.3 (L) 01/04/2024 2032   TCO2 18 (L) 01/04/2024 2032   ACIDBASEDEF 8.0 (H) 01/04/2024 2032   O2SAT 75.7 01/05/2024 0830    - Abd: ND - Extremity: warm  .Intake/Output      03/25 0701 03/26 0700 03/26 0701 03/27 0700   P.O. 120    I.V. (mL/kg) 65.7 (0.7)    IV Piggyback     Total Intake(mL/kg) 185.7 (2.1)    Urine (mL/kg/hr) 1000 (0.5)    Total Output 1000    Net -814.3            _______________________________________________________________ Labs:    Latest Ref Rng & Units 01/10/2024    2:53 AM 01/09/2024    4:56 AM 01/08/2024    4:10 PM  CBC  WBC 4.0 - 10.5 K/uL 7.3  7.3  11.1   Hemoglobin 13.0 - 17.0 g/dL 9.5  8.8  13.0   Hematocrit 39.0 - 52.0 % 29.0  27.3  31.2   Platelets 150 - 400 K/uL 198  177  213       Latest Ref Rng & Units 01/10/2024    2:53 AM 01/09/2024    4:56 AM 01/08/2024    4:10 PM  CMP  Glucose 70 - 99 mg/dL 91  865  784   BUN 8 - 23 mg/dL 41  40  42   Creatinine 0.61 - 1.24 mg/dL 6.96  2.95  2.84   Sodium 135 - 145 mmol/L 139   135  137   Potassium 3.5 - 5.1 mmol/L 4.0  3.6  4.0   Chloride 98 - 111 mmol/L 109  104  107   CO2 22 - 32 mmol/L 21  21  22    Calcium 8.9 - 10.3 mg/dL 7.7  7.1  7.5     CXR: PV congestion  _______________________________________________________________  Assessment and Plan: POD 6 s/p CABG, CVA  Neuro: neuro on board.   CV: on A/S/BB.  On amio and eliquis Pulm: IS, pulm hygiene Renal: creat down.  Holding diuresis GI: on diet Heme: stable ID: [email or web address] Endo: SSI Dispo: possible floor   Kerri Kovacik O Cece Milhouse 01/10/2024 9:01 AM

## 2024-01-10 NOTE — Progress Notes (Signed)
 Physical Therapy Treatment Patient Details Name: Tyler David MRN: [id number] DOB: 08-07-1946 Today's Date: 01/10/2024   History of Present Illness 78 y.o. male presents to [facility] 01/04/24 for CABGx3.3/21 CT head showed small left frontal strokes. PMHx:  CKD stage IIIb, hypertension, hyperlipidemia, prostate cancer    PT Comments  Pt with noted improved ability to raise L UE but continues to demo weakness in L wrist and grip. Focused on standing with increased weight shift to the R to promote sensory, proprioception, and muscle contraction via trying to lift L LE. Worked on sit to stands with R weight shift bias. Pt demonstrating improved ability to stand with less assist overall but continues to require maxA to prevent R knee buckling. Pt remains appropriate for aggressive inpatient rehab program > 3 hrs a day to achieve maximal functional return. Acute PT to cont to follow.    If plan is discharge home, recommend the following: A lot of help with walking and/or transfers;A lot of help with bathing/dressing/bathroom;Assistance with cooking/housework;Assist for transportation;Help with stairs or ramp for entrance   Can travel by private vehicle        Equipment Recommendations  Rolling walker (2 wheels);BSC/3in1;Wheelchair (measurements PT);Wheelchair cushion (measurements PT);Hoyer lift    Recommendations for [employer] [facility] consult     Precautions / Restrictions Precautions Precautions: Fall;Sternal Precaution Booklet Issued: Yes (comment) Recall of Precautions/Restrictions: Impaired Precaution/Restrictions Comments: foley catheter Restrictions Weight Bearing Restrictions Per Provider Order: No Other Position/Activity Restrictions: sternal     Mobility  Bed Mobility               General bed mobility comments: pt received in chair and left in chair    Transfers Overall transfer level: Needs assistance Equipment used: 2 person hand held assist, Ambulation equipment  used Transfers: Sit to/from Stand Sit to Stand: Max assist           General transfer comment: worked on sit to partial stands from chair without use of UEs with PT in front and use of gait belt. completed 5 STS, focused on increased weight shift to the R to promote sensory and proprioception through R UE. RN to make sure pt not supporting self with UEs or using them to push/pull.    Ambulation/Gait               General Gait Details: unable this date   Stairs             Wheelchair Mobility     Tilt Bed    Modified Rankin (Stroke Patients Only) Modified Rankin (Stroke Patients Only) Pre-Morbid Rankin Score: No symptoms Modified Rankin: Severe disability     Balance Overall balance assessment: Needs assistance, Mild deficits observed, not formally tested Sitting-balance support: Bilateral upper extremity supported, Feet supported Sitting balance-Leahy Scale: Fair Sitting balance - Comments: improved ability to attain midline   Standing balance support: During functional activity, Reliant on assistive device for balance Standing balance-Leahy Scale: Zero Standing balance comment: unable to maintain balance without maxAx2                            Communication Communication Communication: No apparent difficulties  Cognition Arousal: Alert Behavior During Therapy: WFL for tasks assessed/performed   PT - Cognitive impairments: Sequencing, Problem solving, Safety/Judgement                       PT - Cognition Comments: more alert  and able to conversate with PT, pt continues with impaired L/R discrimination, suspect due to impaired sensation and proprioception, delayed response time, and impaired sequencing/processing Following commands: Impaired Following commands impaired: Follows one step commands with increased time    Cueing Cueing Techniques: Verbal cues, Tactile cues, Visual cues  Exercises      General Comments General  comments (skin integrity, edema, etc.): VSS      Pertinent Vitals/Pain Pain Assessment Pain Assessment: No/denies pain    Home Living                          Prior Function            PT Goals (current goals can now be found in the care plan section) Acute Rehab PT Goals Patient Stated Goal: to get better PT Goal Formulation: With patient Time For Goal Achievement: 01/20/24 Potential to Achieve Goals: Good Progress towards PT goals: Not progressing toward goals - comment    Frequency    Min 3X/week      PT Plan      Co-evaluation              AM-PAC PT "6 Clicks" Mobility   Outcome Measure  Help needed turning from your back to your side while in a flat bed without using bedrails?: A Lot Help needed moving from lying on your back to sitting on the side of a flat bed without using bedrails?: A Lot Help needed moving to and from a bed to a chair (including a wheelchair)?: A Lot Help needed standing up from a chair using your arms (e.g., wheelchair or bedside chair)?: A Lot Help needed to walk in hospital room?: Total Help needed climbing 3-5 steps with a railing? : Total 6 Click Score: 10    End of Session Equipment Utilized During Treatment: Oxygen Activity Tolerance: Patient tolerated treatment well Patient left: with call bell/phone within reach;with family/visitor present;in chair Nurse Communication: Mobility status PT Visit Diagnosis: Unsteadiness on feet (R26.81);Other abnormalities of gait and mobility (R26.89);Muscle weakness (generalized) (M62.81);Hemiplegia and hemiparesis Hemiplegia - Right/Left: Right Hemiplegia - caused by: Cerebral infarction     Time: 1111-1135 PT Time Calculation (min) (ACUTE ONLY): 24 min  Charges:    $Therapeutic Activity: 8-22 mins $Neuromuscular Re-education: 8-22 mins PT General Charges $$ ACUTE PT VISIT: 1 Visit                     Lewis Shock, PT, DPT Acute Rehabilitation Services Secure  chat preferred Office #: [phone]    Iona Hansen 01/10/2024, 11:57 AM

## 2024-01-11 LAB — CBC
HCT: 30.1 % — ABNORMAL LOW (ref 39.0–52.0)
Hemoglobin: 9.9 g/dL — ABNORMAL LOW (ref 13.0–17.0)
MCH: 29.5 pg (ref 26.0–34.0)
MCHC: 32.9 g/dL (ref 30.0–36.0)
MCV: 89.6 fL (ref 80.0–100.0)
Platelets: 283 10*3/uL (ref 150–400)
RBC: 3.36 MIL/uL — ABNORMAL LOW (ref 4.22–5.81)
RDW: 13.8 % (ref 11.5–15.5)
WBC: 8.1 10*3/uL (ref 4.0–10.5)
nRBC: 0 % (ref 0.0–0.2)

## 2024-01-11 LAB — GLUCOSE, CAPILLARY
Glucose-Capillary: 101 mg/dL — ABNORMAL HIGH (ref 70–99)
Glucose-Capillary: 94 mg/dL (ref 70–99)
Glucose-Capillary: 102 mg/dL — ABNORMAL HIGH (ref 70–99)
Glucose-Capillary: 129 mg/dL — ABNORMAL HIGH (ref 70–99)
Glucose-Capillary: 128 mg/dL — ABNORMAL HIGH (ref 70–99)

## 2024-01-11 LAB — BASIC METABOLIC PANEL WITH GFR
Anion gap: 8 (ref 5–15)
BUN: 36 mg/dL — ABNORMAL HIGH (ref 8–23)
CO2: 24 mmol/L (ref 22–32)
Calcium: 7.9 mg/dL — ABNORMAL LOW (ref 8.9–10.3)
Chloride: 109 mmol/L (ref 98–111)
Creatinine, Ser: 2.2 mg/dL — ABNORMAL HIGH (ref 0.61–1.24)
GFR, Estimated: 30 mL/min — ABNORMAL LOW (ref >60)
Glucose, Bld: 105 mg/dL — ABNORMAL HIGH (ref 70–99)
Potassium: 3.9 mmol/L (ref 3.5–5.1)
Sodium: 141 mmol/L (ref 135–145)

## 2024-01-11 MED ORDER — AMLODIPINE BESYLATE 10 MG PO TABS
10.0000 mg | ORAL_TABLET | Freq: Every day | ORAL | Status: DC
  Administered 2024-01-11 – 2024-01-12 (×2): 10 mg via ORAL
  Filled 2024-01-11 (×2): qty 1

## 2024-01-11 NOTE — Progress Notes (Signed)
 Pt in chair with wife present in room   Pt received OHS book and education on restrictions, heart healthy diet, ex guidelines, Move in the Tube sheet, incentive spirometer use when d/c and CRPII. Pt denies questions and was encouraged to look in the book for additional information. Referral placed to [facility].   Faustino Congress MS, ACSM-CEP  01/11/2024 10:11 AM    [phone]

## 2024-01-11 NOTE — Progress Notes (Signed)
 Physical Therapy Treatment Patient Details Name: Tyler David MRN: [id number] DOB: 1945/11/26 Today's Date: 01/11/2024   History of Present Illness 78 y.o. male presents to [facility] 01/04/24 for CABGx3.3/21 CT head showed small left frontal strokes. PMHx:  CKD stage IIIb, hypertension, hyperlipidemia, prostate cancer    PT Comments  Pt resting in bed on arrival, eager for OOB mobility and agreeable to session. Session focused on increased weight bearing and weight shifting to R and through RLE to increased proprioceptive awareness and and muscle contraction. Pt able to perform standing from EOB to stedy frame and from recliner without UE support with grossly mod A x2 with hands on assist to block R knee. Pt performing sit<>stand and weight shifting with LLE elevated on fall mat to encourage increased R lateral weight shift and increased weight bearing through R to encourage more midline posture with pt tolerating well. Pt continues to require cues to maintain sternal precautions throughout. Patient will benefit from intensive inpatient follow-up therapy, >3 hours/day, will continue to follow acutely.    If plan is discharge home, recommend the following: A lot of help with walking and/or transfers;A lot of help with bathing/dressing/bathroom;Assistance with cooking/housework;Assist for transportation;Help with stairs or ramp for entrance   Can travel by private vehicle        Equipment Recommendations  Rolling walker (2 wheels);BSC/3in1;Wheelchair (measurements PT);Wheelchair cushion (measurements PT);Hoyer lift    Recommendations for [employer]       Precautions / Restrictions Precautions Precautions: Fall;Sternal Recall of Precautions/Restrictions: Impaired Restrictions Weight Bearing Restrictions Per Provider Order: No Other Position/Activity Restrictions: sternal     Mobility  Bed Mobility Overal bed mobility: Needs Assistance Bed Mobility: Rolling, Sidelying to Sit Rolling:  Mod assist Sidelying to sit: +2 for physical assistance, Mod assist       General bed mobility comments: cues for technique, assist to progress to sidelying, for R LE over EOB, to raise trunk and for R hip to EOB    Transfers Overall transfer level: Needs assistance Equipment used: 2 person hand held assist, Ambulation equipment used Transfers: Sit to/from Stand Sit to Stand: +2 physical assistance, Mod assist, Via lift equipment, From elevated surface           General transfer comment: worked on sit to stand from elevated bed, stedy platforms and recliner without stedy, cues for hand placement, use of momentum, posture and control of descent, fall mat placed under LLE to increase weight shift and weight bearing through RLE Transfer via Lift Equipment: Stedy  Ambulation/Gait               General Gait Details: unable   Optometrist     Tilt Bed    Modified Rankin (Stroke Patients Only)       Balance Overall balance assessment: Needs assistance, Mild deficits observed, not formally tested Sitting-balance support: No upper extremity supported, Feet supported Sitting balance-Leahy Scale: Fair Sitting balance - Comments: maintaining midline without cues     Standing balance-Leahy Scale: Poor Standing balance comment: maintains with +2 min-mod assist while addressing weight shift                            Communication Communication Communication: No apparent difficulties  Cognition Arousal: Alert Behavior During Therapy: WFL for tasks assessed/performed  Following commands: Impaired Following commands impaired: Follows one step commands with increased time    Cueing    Exercises      General Comments General comments (skin integrity, edema, etc.): VSS, spouse present and supportive      Pertinent Vitals/Pain Pain Assessment Pain Assessment: No/denies pain     Home Living                          Prior Function            PT Goals (current goals can now be found in the care plan section) Acute Rehab PT Goals PT Goal Formulation: With patient Time For Goal Achievement: 01/20/24 Progress towards PT goals: Progressing toward goals    Frequency    Min 3X/week      PT Plan      Co-evaluation PT/OT/SLP Co-Evaluation/Treatment: Yes Reason for Co-Treatment: Complexity of the patient's impairments (multi-system involvement);For patient/therapist safety PT goals addressed during session: Mobility/safety with mobility;Balance;Proper use of DME OT goals addressed during session: ADL's and self-care;Strengthening/ROM      AM-PAC PT "6 Clicks" Mobility   Outcome Measure  Help needed turning from your back to your side while in a flat bed without using bedrails?: A Lot Help needed moving from lying on your back to sitting on the side of a flat bed without using bedrails?: A Lot Help needed moving to and from a bed to a chair (including a wheelchair)?: A Lot Help needed standing up from a chair using your arms (e.g., wheelchair or bedside chair)?: A Lot Help needed to walk in hospital room?: Total Help needed climbing 3-5 steps with a railing? : Total 6 Click Score: 10    End of Session Equipment Utilized During Treatment: Gait belt Activity Tolerance: Patient tolerated treatment well Patient left: in chair;with call bell/phone within reach;with chair alarm set;with family/visitor present Nurse Communication: Mobility status PT Visit Diagnosis: Unsteadiness on feet (R26.81);Other abnormalities of gait and mobility (R26.89);Muscle weakness (generalized) (M62.81);Hemiplegia and hemiparesis Hemiplegia - Right/Left: Right Hemiplegia - caused by: Cerebral infarction     Time: 0921-0950 PT Time Calculation (min) (ACUTE ONLY): 29 min  Charges:    $Neuromuscular Re-education: 8-22 mins PT General Charges $$ ACUTE PT  VISIT: 1 Visit                     [name] R. PTA Acute Rehabilitation Services Office: [phone]   Catalina Antigua 01/11/2024, 1:00 PM

## 2024-01-11 NOTE — Progress Notes (Signed)
 Occupational Therapy Treatment Patient Details Name: Tyler David MRN: [id number] DOB: 1945/12/05 Today's Date: 01/11/2024   History of present illness 78 y.o. male presents to [facility] 01/04/24 for CABGx3.3/21 CT head showed small left frontal strokes. PMHx:  CKD stage IIIb, hypertension, hyperlipidemia, prostate cancer   OT comments  Pt is making excellent gains in mobility, balance and ADL independence. Demonstrates fair unsupported sitting balance at EOB, improved upright posture in standing and ability to weight shift as a precursor to transfers without Stedy. Pt is now able to self feed and groom with min assist to manage containers/set up. Following commands with less delay, improved attention. Pt using L hand functionally to pull gown down over LEs. Continues to need cues for sternal precautions and +2 assist for OOB. He is an excellent intensive rehab candidate. Patient will benefit from intensive inpatient follow-up therapy, >3 hours/day       If plan is discharge home, recommend the following:  Two people to help with walking and/or transfers;Two people to help with bathing/dressing/bathroom;Assistance with feeding;Direct supervision/assist for medications management;Direct supervision/assist for financial management;Assist for transportation;Help with stairs or ramp for entrance   Equipment Recommendations  Other (comment) (defer)    Recommendations for Other Services      Precautions / Restrictions Precautions Precautions: Fall;Sternal Recall of Precautions/Restrictions: Impaired Restrictions Weight Bearing Restrictions Per Provider Order: No       Mobility Bed Mobility Overal bed mobility: Needs Assistance Bed Mobility: Rolling, Sidelying to Sit Rolling: Mod assist Sidelying to sit: +2 for physical assistance, Mod assist       General bed mobility comments: cues for technique, assist to progress to sidelying, for R LE over EOB, to raise trunk and for R hip to EOB     Transfers Overall transfer level: Needs assistance Equipment used: 2 person hand held assist, Ambulation equipment used Transfers: Sit to/from Stand Sit to Stand: +2 physical assistance, Mod assist, Via lift equipment, From elevated surface           General transfer comment: worked on sit to stand from elevated bed, stedy platforms and recliner without stedy, cues for hand placement, use of momentum, posture and control of descent Transfer via Lift Equipment: Stedy   Balance Overall balance assessment: Needs assistance, Mild deficits observed, not formally tested Sitting-balance support: No upper extremity supported, Feet supported Sitting balance-Leahy Scale: Fair Sitting balance - Comments: maintaining midline without cues     Standing balance-Leahy Scale: Poor Standing balance comment: maintains with +2 min-mod assist while addressing weight shift                           ADL either performed or assessed with clinical judgement   ADL Overall ADL's : Needs assistance/impaired Eating/Feeding: Minimal assistance;Bed level Eating/Feeding Details (indicate cue type and reason): self feeds with R hand and assist to open containers Grooming: Oral care;Sitting Grooming Details (indicate cue type and reason): min assist to set up                     Toileting- Clothing Manipulation and Hygiene: Total assistance;+2 for physical assistance;Sit to/from stand Toileting - Clothing Manipulation Details (indicate cue type and reason): max to stand, total for pericare            Extremity/Trunk Assessment              Vision       Perception Perception Preception Impairment Details: Body Scheme  Perception-Other Comments: Impaired R/L Scientist, clinical (histocompatibility and immunogenetics) Communication: No apparent difficulties   Cognition Arousal: Alert Behavior During Therapy: WFL for tasks assessed/performed Cognition: Cognition impaired      Awareness: Intellectual awareness intact, Online awareness impaired Memory impairment (select all impairments): Short-term memory   Executive functioning impairment (select all impairments): Initiation, Sequencing, Problem solving OT - Cognition Comments: slow processing speed, cues for sternal precautions                 Following commands: Impaired Following commands impaired: Follows one step commands with increased time      Cueing      Exercises      Shoulder Instructions       General Comments      Pertinent Vitals/ Pain       Pain Assessment Pain Assessment: No/denies pain  Home Living                                          Prior Functioning/Environment              Frequency  Min 3X/week        Progress Toward Goals  OT Goals(current goals can now be found in the care plan section)  Progress towards OT goals: Progressing toward goals  Acute Rehab OT Goals OT Goal Formulation: With patient/family Time For Goal Achievement: 01/22/24 Potential to Achieve Goals: Good  Plan      Co-evaluation    PT/OT/SLP Co-Evaluation/Treatment: Yes Reason for Co-Treatment: Complexity of the patient's impairments (multi-system involvement);For patient/therapist safety   OT goals addressed during session: ADL's and self-care;Strengthening/ROM      AM-PAC OT "6 Clicks" Daily Activity     Outcome Measure   Help from another person eating meals?: A Little Help from another person taking care of personal grooming?: A Little Help from another person toileting, which includes using toliet, bedpan, or urinal?: Total Help from another person bathing (including washing, rinsing, drying)?: A Lot Help from another person to put on and taking off regular upper body clothing?: A Lot Help from another person to put on and taking off regular lower body clothing?: Total 6 Click Score: 12    End of Session Equipment Utilized During Treatment:  Gait belt  OT Visit Diagnosis: Unsteadiness on feet (R26.81);Muscle weakness (generalized) (M62.81);Other symptoms and signs involving cognitive function   Activity Tolerance Patient tolerated treatment well   Patient Left in chair;with call bell/phone within reach;with family/visitor present;Other (comment) (cardiac rehab in room)   Nurse Communication Other (comment) (communicated with charge nurse about last night's care)        Time: 6578-4696 OT Time Calculation (min): 34 min  Charges: OT General Charges $OT Visit: 1 Visit OT Treatments $Self Care/Home Management : 8-22 mins  Berna Spare, OTR/L Acute Rehabilitation Services Office: [phone]   Evern Bio 01/11/2024, 10:44 AM

## 2024-01-11 NOTE — Progress Notes (Addendum)
 Inpatient Rehab Admissions Coordinator:  Saw pt and wife at bedside. Informed them that insurance authorization was started yesterday and awaiting insurance approval. Will continue to follow.   Received insurance approval. Informed pt's wife John Day. Awaiting medical clearance.    Wolfgang Phoenix, MS, CCC-SLP Admissions Coordinator [phone]

## 2024-01-11 NOTE — Progress Notes (Signed)
 Speech Language Pathology Treatment: Cognitive-Linquistic  Patient Details Name: Tyler David MRN: [id number] DOB: 01-01-1946 Today's Date: 01/11/2024 Time: 0865-7846 SLP Time Calculation (min) (ACUTE ONLY): 13 min  Assessment / Plan / Recommendation Clinical Impression  Pt reports improvements overall in mentation, level of alertness, and cognition. He identified errors in a pillbox activity given fading Mod-Min cueing for sequencing and sustained attention. Pt reported that his performance felt significantly different than baseline as he uses a pillbox weekly. Feel that pt will continue to benefit from intensive SLP f/u to target functional cognitive activities given his PLOF, >3 hrs/day. Will continue following acutely.    HPI HPI: Patient is a 78 y.o. male with PMH: GERD, HLD, HTN, CKD stage III, arthritis, prostate cancer. He was recently diagnosed with CHF and placed on diuretics. He underwent CABG x3 on 01/05/24 and extubated post-surgery to BiPAP and progressed to oxygen via [place]. Following surgery, he was noted to have right leg weakness with intact sensation. 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      SLP Plan  Continue with current plan of care      Recommendations for follow up therapy are one component of a multi-disciplinary discharge planning process, led by the attending physician.  Recommendations may be updated based on patient status, additional functional criteria and insurance authorization.    Recommendations                     Oral care BID   Frequent or constant Supervision/Assistance Attention and concentration deficit;Cognitive communication deficit (N62.952) Cerebral infarction   Continue with current plan of care     Gwynneth Aliment, M.A., CF-SLP Speech Language Pathology, Acute Rehabilitation Services  Secure Chat preferred [phone]   01/11/2024, 4:55 PM

## 2024-01-11 NOTE — Progress Notes (Addendum)
 7 Days Post-Op Procedure(s) (LRB): CORONARY ARTERY BYPASS GRAFTING TIMES THREE USING LEFT INTERNAL MAMMARY ARTERY AND RIGHT GREAT SAPHENOUS VEIN HARVESTED ENDOSCOPICALLY (N/A) ECHOCARDIOGRAM, TRANSESOPHAGEAL, INTRAOPERATIVE (N/A) Subjective: Had a frustrating night with nursing. Feeling fair  Objective: Vital signs in last 24 hours: Temp:  [97.5 F (36.4 C)-98.6 F (37 C)] 98.6 F (37 C) (03/27 0255) Pulse Rate:  [58-73] 62 (03/26 1400) Cardiac Rhythm: Normal sinus rhythm;Bundle branch block (03/26 1900) Resp:  [6-20] 20 (03/27 0255) BP: (119-160)/(51-77) 160/77 (03/27 0255) SpO2:  [93 %-96 %] 93 % (03/26 1400) FiO2 (%):  [21 %] 21 % (03/26 0903) Weight:  [89 kg] 89 kg (03/27 0255)  Hemodynamic parameters for last 24 hours:    Intake/Output from previous day: 03/26 0701 - 03/27 0700 In: 600 [P.O.:600] Out: 1600 [Urine:1600] Intake/Output this shift: No intake/output data recorded.  General appearance: alert, cooperative, and no distress Heart: regular rate and rhythm Lungs: mildly dim in bases Abdomen: benign Extremities: minor ankle edema Wound: incis healing well  Lab Results: Recent Labs    01/10/24 0253 01/11/24 0409  WBC 7.3 8.1  HGB 9.5* 9.9*  HCT 29.0* 30.1*  PLT 198 283   BMET:  Recent Labs    01/10/24 0253 01/11/24 0409  NA 139 141  K 4.0 3.9  CL 109 109  CO2 21* 24  GLUCOSE 91 105*  BUN 41* 36*  CREATININE 2.18* 2.20*  CALCIUM 7.7* 7.9*    PT/INR: No results for input(s): "LABPROT", "INR" in the last 72 hours. ABG    Component Value Date/Time   PHART 7.306 (L) 01/04/2024 2032   HCO3 17.3 (L) 01/04/2024 2032   TCO2 18 (L) 01/04/2024 2032   ACIDBASEDEF 8.0 (H) 01/04/2024 2032   O2SAT 75.7 01/05/2024 0830   CBG (last 3)  Recent Labs    01/10/24 2042 01/10/24 2310 01/11/24 0322  GLUCAP 116* 116* 101*    Meds Scheduled Meds:  amiodarone  150 mg Intravenous Once   amiodarone  400 mg Oral BID   amLODipine  5 mg Oral Daily    apixaban  5 mg Oral BID   aspirin EC  81 mg Oral Daily   Or   aspirin  81 mg Per Tube Daily   bisacodyl  10 mg Oral Daily   Or   bisacodyl  10 mg Rectal Daily   Chlorhexidine Gluconate Cloth  6 each Topical Daily   docusate sodium  200 mg Oral Daily   ezetimibe  10 mg Oral Daily   feeding supplement  237 mL Oral BID BM   insulin aspart  0-24 Units Subcutaneous Q4H   levothyroxine  50 mcg Oral QAC breakfast   metoprolol tartrate  12.5 mg Oral BID   Or   metoprolol tartrate  12.5 mg Per Tube BID   pantoprazole  40 mg Oral Daily   sodium chloride flush  3 mL Intravenous Q12H   Continuous Infusions:  sodium chloride     albumin human     PRN Meds:.sodium chloride, albumin human, metoprolol tartrate, morphine injection, naLOXone (NARCAN)  injection, ondansetron (ZOFRAN) IV, mouth rinse, oxyCODONE, sodium chloride flush  Xrays No results found.  Assessment/Plan: S/P Procedure(s) (LRB): CORONARY ARTERY BYPASS GRAFTING TIMES THREE USING LEFT INTERNAL MAMMARY ARTERY AND RIGHT GREAT SAPHENOUS VEIN HARVESTED ENDOSCOPICALLY (N/A) ECHOCARDIOGRAM, TRANSESOPHAGEAL, INTRAOPERATIVE (N/A) POD#7  1 afeb, s BP 119-160, NSR, on amio and eliquis, will increase norvasc dose for htn 2 O2 sats good on RA 3 weight stable 4 UOP good, diuresis  on hold 5 BUN/creat fairly stable- at 36/2.20 6 H/H stable 7 no new CXR's 8 BS well controlled 9 is a CIR candidate per rehab, conts rehab modalities- appears to be medically ready when authorization approved      LOS: 7 days    Rowe Clack PA-C Pager [phone] 01/11/2024   Agree with above Dispo planning  Vernestine Brodhead Keane Scrape

## 2024-01-12 ENCOUNTER — Encounter (HOSPITAL_COMMUNITY): Payer: Self-pay | Admitting: Physical Medicine and Rehabilitation

## 2024-01-12 ENCOUNTER — Inpatient Hospital Stay (HOSPITAL_COMMUNITY)
Admission: AD | Admit: 2024-01-12 | Discharge: 2024-01-23 | DRG: 057 | Disposition: A | Discharge status: Home / Self Care | Source: Intra-hospital | Attending: Physical Medicine and Rehabilitation | Admitting: Physical Medicine and Rehabilitation

## 2024-01-12 ENCOUNTER — Other Ambulatory Visit: Payer: Self-pay

## 2024-01-12 DIAGNOSIS — Z79899 Other long term (current) drug therapy: Secondary | ICD-10-CM | POA: Diagnosis not present

## 2024-01-12 DIAGNOSIS — I4891 Unspecified atrial fibrillation: Secondary | ICD-10-CM | POA: Diagnosis not present

## 2024-01-12 DIAGNOSIS — Z8546 Personal history of malignant neoplasm of prostate: Secondary | ICD-10-CM | POA: Diagnosis not present

## 2024-01-12 DIAGNOSIS — G4700 Insomnia, unspecified: Secondary | ICD-10-CM | POA: Diagnosis present

## 2024-01-12 DIAGNOSIS — Z8042 Family history of malignant neoplasm of prostate: Secondary | ICD-10-CM

## 2024-01-12 DIAGNOSIS — N1831 Chronic kidney disease, stage 3a: Secondary | ICD-10-CM

## 2024-01-12 DIAGNOSIS — Z951 Presence of aortocoronary bypass graft: Secondary | ICD-10-CM

## 2024-01-12 DIAGNOSIS — K5900 Constipation, unspecified: Secondary | ICD-10-CM | POA: Diagnosis not present

## 2024-01-12 DIAGNOSIS — Z79890 Hormone replacement therapy (postmenopausal): Secondary | ICD-10-CM

## 2024-01-12 DIAGNOSIS — Z880 Allergy status to penicillin: Secondary | ICD-10-CM

## 2024-01-12 DIAGNOSIS — Z888 Allergy status to other drugs, medicaments and biological substances status: Secondary | ICD-10-CM

## 2024-01-12 DIAGNOSIS — Z825 Family history of asthma and other chronic lower respiratory diseases: Secondary | ICD-10-CM

## 2024-01-12 DIAGNOSIS — N183 Chronic kidney disease, stage 3 unspecified: Secondary | ICD-10-CM | POA: Diagnosis present

## 2024-01-12 DIAGNOSIS — I5022 Chronic systolic (congestive) heart failure: Secondary | ICD-10-CM | POA: Diagnosis present

## 2024-01-12 DIAGNOSIS — E785 Hyperlipidemia, unspecified: Secondary | ICD-10-CM | POA: Diagnosis present

## 2024-01-12 DIAGNOSIS — I10 Essential (primary) hypertension: Secondary | ICD-10-CM | POA: Diagnosis not present

## 2024-01-12 DIAGNOSIS — D62 Acute posthemorrhagic anemia: Secondary | ICD-10-CM | POA: Diagnosis present

## 2024-01-12 DIAGNOSIS — Z8041 Family history of malignant neoplasm of ovary: Secondary | ICD-10-CM | POA: Diagnosis not present

## 2024-01-12 DIAGNOSIS — E039 Hypothyroidism, unspecified: Secondary | ICD-10-CM | POA: Diagnosis not present

## 2024-01-12 DIAGNOSIS — M7989 Other specified soft tissue disorders: Secondary | ICD-10-CM | POA: Diagnosis not present

## 2024-01-12 DIAGNOSIS — I2510 Atherosclerotic heart disease of native coronary artery without angina pectoris: Secondary | ICD-10-CM | POA: Diagnosis not present

## 2024-01-12 DIAGNOSIS — Z96642 Presence of left artificial hip joint: Secondary | ICD-10-CM | POA: Diagnosis present

## 2024-01-12 DIAGNOSIS — I749 Embolism and thrombosis of unspecified artery: Secondary | ICD-10-CM

## 2024-01-12 DIAGNOSIS — I69398 Other sequelae of cerebral infarction: Secondary | ICD-10-CM | POA: Diagnosis not present

## 2024-01-12 DIAGNOSIS — I5020 Unspecified systolic (congestive) heart failure: Secondary | ICD-10-CM | POA: Diagnosis not present

## 2024-01-12 DIAGNOSIS — I130 Hypertensive heart and chronic kidney disease with heart failure and stage 1 through stage 4 chronic kidney disease, or unspecified chronic kidney disease: Secondary | ICD-10-CM | POA: Diagnosis not present

## 2024-01-12 DIAGNOSIS — J309 Allergic rhinitis, unspecified: Secondary | ICD-10-CM | POA: Diagnosis present

## 2024-01-12 DIAGNOSIS — I429 Cardiomyopathy, unspecified: Secondary | ICD-10-CM | POA: Diagnosis present

## 2024-01-12 DIAGNOSIS — D72829 Elevated white blood cell count, unspecified: Secondary | ICD-10-CM | POA: Diagnosis not present

## 2024-01-12 DIAGNOSIS — H3530 Unspecified macular degeneration: Secondary | ICD-10-CM | POA: Diagnosis present

## 2024-01-12 DIAGNOSIS — Z7901 Long term (current) use of anticoagulants: Secondary | ICD-10-CM

## 2024-01-12 DIAGNOSIS — Z8249 Family history of ischemic heart disease and other diseases of the circulatory system: Secondary | ICD-10-CM | POA: Diagnosis not present

## 2024-01-12 DIAGNOSIS — K219 Gastro-esophageal reflux disease without esophagitis: Secondary | ICD-10-CM | POA: Diagnosis not present

## 2024-01-12 DIAGNOSIS — D75839 Thrombocytosis, unspecified: Secondary | ICD-10-CM | POA: Diagnosis present

## 2024-01-12 DIAGNOSIS — I639 Cerebral infarction, unspecified: Principal | ICD-10-CM | POA: Diagnosis present

## 2024-01-12 DIAGNOSIS — Z7982 Long term (current) use of aspirin: Secondary | ICD-10-CM | POA: Diagnosis not present

## 2024-01-12 DIAGNOSIS — R531 Weakness: Secondary | ICD-10-CM | POA: Diagnosis present

## 2024-01-12 DIAGNOSIS — N1832 Chronic kidney disease, stage 3b: Secondary | ICD-10-CM | POA: Diagnosis not present

## 2024-01-12 LAB — CBC
HCT: 30.5 % — ABNORMAL LOW (ref 39.0–52.0)
Hemoglobin: 9.8 g/dL — ABNORMAL LOW (ref 13.0–17.0)
MCH: 29.1 pg (ref 26.0–34.0)
MCHC: 32.1 g/dL (ref 30.0–36.0)
MCV: 90.5 fL (ref 80.0–100.0)
Platelets: 324 10*3/uL (ref 150–400)
RBC: 3.37 MIL/uL — ABNORMAL LOW (ref 4.22–5.81)
RDW: 14 % (ref 11.5–15.5)
WBC: 10.5 10*3/uL (ref 4.0–10.5)
nRBC: 0 % (ref 0.0–0.2)

## 2024-01-12 MED ORDER — PANTOPRAZOLE SODIUM 40 MG PO TBEC
40.0000 mg | DELAYED_RELEASE_TABLET | Freq: Every day | ORAL | Status: DC
  Administered 2024-01-13 – 2024-01-23 (×11): 40 mg via ORAL
  Filled 2024-01-12 (×11): qty 1

## 2024-01-12 MED ORDER — EZETIMIBE 10 MG PO TABS
10.0000 mg | ORAL_TABLET | Freq: Every day | ORAL | Status: DC
  Administered 2024-01-13 – 2024-01-23 (×11): 10 mg via ORAL
  Filled 2024-01-12 (×11): qty 1

## 2024-01-12 MED ORDER — BISACODYL 5 MG PO TBEC
10.0000 mg | DELAYED_RELEASE_TABLET | Freq: Every day | ORAL | Status: DC
  Administered 2024-01-13 – 2024-01-22 (×5): 10 mg via ORAL
  Filled 2024-01-12 (×11): qty 2

## 2024-01-12 MED ORDER — APIXABAN 5 MG PO TABS
5.0000 mg | ORAL_TABLET | Freq: Two times a day (BID) | ORAL | Status: DC
Start: 2024-01-12 — End: 2024-01-23

## 2024-01-12 MED ORDER — FUROSEMIDE 40 MG PO TABS: 40.0000 mg | ORAL_TABLET | Freq: Every day | ORAL | Status: DC

## 2024-01-12 MED ORDER — LEVOTHYROXINE SODIUM 50 MCG PO TABS
50.0000 ug | ORAL_TABLET | Freq: Every day | ORAL | Status: DC
  Administered 2024-01-13 – 2024-01-23 (×11): 50 ug via ORAL
  Filled 2024-01-12 (×11): qty 1

## 2024-01-12 MED ORDER — APIXABAN 5 MG PO TABS
5.0000 mg | ORAL_TABLET | Freq: Two times a day (BID) | ORAL | Status: DC
  Administered 2024-01-12 – 2024-01-23 (×22): 5 mg via ORAL
  Filled 2024-01-12 (×22): qty 1

## 2024-01-12 MED ORDER — AMIODARONE HCL 200 MG PO TABS
200.0000 mg | ORAL_TABLET | Freq: Every day | ORAL | Status: DC
  Administered 2024-01-20 – 2024-01-23 (×4): 200 mg via ORAL
  Filled 2024-01-12 (×4): qty 1

## 2024-01-12 MED ORDER — OXYCODONE HCL 5 MG PO TABS: 5.0000 mg | ORAL_TABLET | ORAL | Status: DC | PRN

## 2024-01-12 MED ORDER — EZETIMIBE 10 MG PO TABS: 10.0000 mg | ORAL_TABLET | Freq: Every day | ORAL | Status: DC

## 2024-01-12 MED ORDER — ASPIRIN 81 MG PO CHEW
81.0000 mg | CHEWABLE_TABLET | Freq: Every day | ORAL | Status: DC
  Administered 2024-01-16 – 2024-01-22 (×6): 81 mg
  Filled 2024-01-12 (×8): qty 1

## 2024-01-12 MED ORDER — ASPIRIN 81 MG PO TBEC
81.0000 mg | DELAYED_RELEASE_TABLET | Freq: Every day | ORAL | Status: DC
  Administered 2024-01-13 – 2024-01-23 (×5): 81 mg via ORAL
  Filled 2024-01-12 (×9): qty 1

## 2024-01-12 MED ORDER — METOPROLOL SUCCINATE ER 25 MG PO TB24: 25.0000 mg | ORAL_TABLET | Freq: Every day | ORAL | Status: DC

## 2024-01-12 MED ORDER — FAMOTIDINE 20 MG PO TABS
20.0000 mg | ORAL_TABLET | Freq: Every day | ORAL | Status: DC
  Administered 2024-01-12 – 2024-01-22 (×11): 20 mg via ORAL
  Filled 2024-01-12 (×11): qty 1

## 2024-01-12 MED ORDER — AMLODIPINE BESYLATE 10 MG PO TABS
10.0000 mg | ORAL_TABLET | Freq: Every day | ORAL | Status: DC
  Administered 2024-01-13 – 2024-01-23 (×11): 10 mg via ORAL
  Filled 2024-01-12 (×11): qty 1

## 2024-01-12 MED ORDER — DOCUSATE SODIUM 100 MG PO CAPS
200.0000 mg | ORAL_CAPSULE | Freq: Every day | ORAL | Status: DC
  Administered 2024-01-13 – 2024-01-22 (×6): 200 mg via ORAL
  Filled 2024-01-12 (×11): qty 2

## 2024-01-12 MED ORDER — MELATONIN 3 MG PO TABS
3.0000 mg | ORAL_TABLET | Freq: Every evening | ORAL | Status: DC | PRN
  Administered 2024-01-13 – 2024-01-14 (×2): 3 mg via ORAL
  Filled 2024-01-12 (×2): qty 1

## 2024-01-12 MED ORDER — METOPROLOL TARTRATE 12.5 MG HALF TABLET
12.5000 mg | ORAL_TABLET | Freq: Two times a day (BID) | ORAL | Status: DC
  Administered 2024-01-12 – 2024-01-23 (×21): 12.5 mg via ORAL
  Filled 2024-01-12 (×22): qty 1

## 2024-01-12 MED ORDER — ASPIRIN 81 MG PO TBEC: 81.0000 mg | DELAYED_RELEASE_TABLET | Freq: Every day | ORAL | Status: DC

## 2024-01-12 MED ORDER — AMIODARONE HCL 200 MG PO TABS: 400.0000 mg | ORAL_TABLET | Freq: Two times a day (BID) | ORAL | Status: DC

## 2024-01-12 MED ORDER — AMIODARONE HCL 200 MG PO TABS
400.0000 mg | ORAL_TABLET | Freq: Two times a day (BID) | ORAL | Status: AC
  Administered 2024-01-12 – 2024-01-15 (×7): 400 mg via ORAL
  Filled 2024-01-12 (×7): qty 2

## 2024-01-12 MED ORDER — AMLODIPINE BESYLATE 10 MG PO TABS: 10.0000 mg | ORAL_TABLET | Freq: Every day | ORAL | Status: DC

## 2024-01-12 MED ORDER — BISACODYL 10 MG RE SUPP
10.0000 mg | Freq: Every day | RECTAL | Status: DC
  Filled 2024-01-12 (×5): qty 1

## 2024-01-12 MED ORDER — AMIODARONE HCL 200 MG PO TABS
200.0000 mg | ORAL_TABLET | Freq: Two times a day (BID) | ORAL | Status: AC
  Administered 2024-01-16 – 2024-01-19 (×7): 200 mg via ORAL
  Filled 2024-01-12 (×7): qty 1

## 2024-01-12 MED ORDER — METOPROLOL TARTRATE 25 MG/10 ML ORAL SUSPENSION
12.5000 mg | Freq: Two times a day (BID) | ORAL | Status: DC
  Administered 2024-01-16: 12.5 mg
  Filled 2024-01-12 (×15): qty 5

## 2024-01-12 NOTE — TOC Transition Note (Signed)
 Transition of Care (TOC) - Discharge Note Donn Pierini RN, BSN Transitions of Care Unit 4E- RN Case Manager See Treatment Team for direct phone #   Patient Details  Name: Tyler David MRN: [id number] Date of Birth: [birth year]  Transition of Care [facility]) CM/SW Contact:  Darrold Span, RN Phone Number: 01/12/2024, 12:52 PM   Clinical Narrative:    Per CIR liaison pt has received insurance auth for Rehab admission, bed available today and pt stable for transition to [facility] INPT rehab.   Pt will transition later today to INPT rehab.  CM has notified [facility] liaison who has been following w/ [facility] office referral. [facility] will follow for any HH needs post rehab stay.   No further CM needs noted.    Final next level of care: IP Rehab Facility Barriers to Discharge: Barriers Resolved   Patient Goals and CMS Choice Patient states their goals for this hospitalization and ongoing recovery are:: Plan to return home          Discharge Placement               [facility] INPT rehab        Discharge Plan and Services Additional resources added to the After Visit Summary for     Discharge Planning Services: CM Consult Post Acute Care Choice: IP Rehab          DME Arranged: N/A DME Agency: NA         HH Agency: [facility] Date HH Agency Contacted: 01/12/24   Representative spoke with at [facility] Agency: Aggie Cosier  Social Drivers of Health (SDOH) Interventions SDOH Screenings   Food Insecurity: No Food Insecurity (01/04/2024)  Housing: Low Risk  (01/04/2024)  Transportation Needs: No Transportation Needs (01/04/2024)  Utilities: Not At Risk (01/04/2024)  Social Connections: Socially Integrated (01/04/2024)  Tobacco Use: Low Risk  (01/04/2024)     Readmission Risk Interventions    01/12/2024   12:52 PM  Readmission Risk Prevention Plan  Transportation Screening Complete  Home Care Screening Complete  Medication Review (RN CM) Complete

## 2024-01-12 NOTE — Progress Notes (Signed)
 Physical Therapy Treatment Patient Details Name: Tyler David MRN: [id number] DOB: 1946/05/25 Today's Date: 01/12/2024   History of Present Illness 78 y.o. male presents to [facility] 01/04/24 for CABGx3.3/21 CT head showed small left frontal strokes. PMHx:  CKD stage IIIb, hypertension, hyperlipidemia, prostate cancer    PT Comments  Pt up in chair on arrival and agreeable to session with continues progress towards acute goals. Pt able to progress gait this session with EVA walker support and mod A to maintain balance and manage RLE, and +2 assist for chair follow. Pt with poor hip/knee proprioception and weakness, requiring cues for RLE placement and hands on assist ~50% of time to clear R foot during swing phase. Pt continues to require mod A +2 to boost to stand with adherence to sternal precautions. Patient will benefit from intensive inpatient follow-up therapy, >3 hours/day, will continue to follow acutely.     If plan is discharge home, recommend the following: A lot of help with walking and/or transfers;A lot of help with bathing/dressing/bathroom;Assistance with cooking/housework;Assist for transportation;Help with stairs or ramp for entrance   Can travel by private vehicle        Equipment Recommendations  Rolling walker (2 wheels);BSC/3in1;Wheelchair (measurements PT);Wheelchair cushion (measurements PT);Hoyer lift    Recommendations for [employer]       Precautions / Restrictions Precautions Precautions: Fall;Sternal Recall of Precautions/Restrictions: Impaired Restrictions Weight Bearing Restrictions Per Provider Order: No Other Position/Activity Restrictions: sternal     Mobility  Bed Mobility               General bed mobility comments: up in chair on arrival    Transfers Overall transfer level: Needs assistance Equipment used: 1 person hand held assist Transfers: Sit to/from Stand Sit to Stand: Mod assist, +2 physical assistance           General  transfer comment: mod A +2 to boost from sitting in recliner to standing, pt able to push from knees/lap and then place arms on EVA walker    Ambulation/Gait Ambulation/Gait assistance: Mod assist, +2 safety/equipment (+2 for chair follow) Gait Distance (Feet): 12 Feet Assistive device: Fara Boros Gait Pattern/deviations: Step-to pattern, Decreased stance time - right, Decreased stride length, Decreased dorsiflexion - right, Decreased weight shift to right, Knee flexed in stance - right, Ataxic, Narrow base of support Gait velocity: decr     General Gait Details: mod A to maintain balance and manage RLE as pt with poor hip/knee flexion, pt needing hands on assist ~50% of time for RLE clearance during swing phase, cues for R foot placement and wider BOS, good UE support on EVA platform, light assist to steady Medical illustrator     Tilt Bed    Modified Rankin (Stroke Patients Only)       Balance Overall balance assessment: Needs assistance, Mild deficits observed, not formally tested Sitting-balance support: No upper extremity supported, Feet supported Sitting balance-Leahy Scale: Fair Sitting balance - Comments: maintaining midline without cues     Standing balance-Leahy Scale: Poor Standing balance comment: maintains with +2 min-mod assist while addressing weight shift                            Communication Communication Communication: No apparent difficulties  Cognition Arousal: Alert Behavior During Therapy: WFL for tasks assessed/performed  PT - Cognition Comments: cues to maintain sternal precatuions with transfers Following commands: Impaired Following commands impaired: Follows one step commands with increased time    Cueing Cueing Techniques: Verbal cues, Tactile cues, Gestural cues  Exercises      General Comments General comments (skin integrity, edema, etc.): VSS, spouse  present and supportive      Pertinent Vitals/Pain Pain Assessment Pain Assessment: No/denies pain    Home Living                          Prior Function            PT Goals (current goals can now be found in the care plan section) Acute Rehab PT Goals PT Goal Formulation: With patient Time For Goal Achievement: 01/20/24 Progress towards PT goals: Progressing toward goals    Frequency    Min 3X/week      PT Plan      Co-evaluation              AM-PAC PT "6 Clicks" Mobility   Outcome Measure  Help needed turning from your back to your side while in a flat bed without using bedrails?: A Lot Help needed moving from lying on your back to sitting on the side of a flat bed without using bedrails?: A Lot Help needed moving to and from a bed to a chair (including a wheelchair)?: A Lot Help needed standing up from a chair using your arms (e.g., wheelchair or bedside chair)?: A Lot Help needed to walk in hospital room?: Total Help needed climbing 3-5 steps with a railing? : Total 6 Click Score: 10    End of Session Equipment Utilized During Treatment: Gait belt Activity Tolerance: Patient tolerated treatment well Patient left: in chair;with call bell/phone within reach;with family/visitor present Nurse Communication: Mobility status PT Visit Diagnosis: Unsteadiness on feet (R26.81);Other abnormalities of gait and mobility (R26.89);Muscle weakness (generalized) (M62.81);Hemiplegia and hemiparesis Hemiplegia - Right/Left: Right Hemiplegia - caused by: Cerebral infarction     Time: 1478-2956 PT Time Calculation (min) (ACUTE ONLY): 23 min  Charges:    $Gait Training: 23-37 mins PT General Charges $$ ACUTE PT VISIT: 1 Visit                     Lux Meaders R. PTA Acute Rehabilitation Services Office: [phone]   Catalina Antigua 01/12/2024, 12:36 PM

## 2024-01-12 NOTE — H&P (Signed)
 Physical Medicine and Rehabilitation Admission H&P       HPI: Tyler David is a 78 year old right-handed male with history significant for hypertension, migraine headaches, hyperlipidemia, CKD stage III, prostate cancer, left total hip arthroplasty 09/29/2015, coronary artery calcification/cardiomyopathy/left bundle branch block.  Per chart review patient lives with spouse and family.  1 level home with level entry.  Independent prior to admission.  Presented 01/04/2024 with chest pain and shortness of breath.  Workup suggestive of pulmonary edema.  Echocardiogram revealed ejection fraction of 35 to 40%.  Underwent cardiac catheterization demonstrating multivessel obstructive coronary artery disease.  Underwent CABG x 3 01/04/2024 per Dr. Cliffton Asters with endoscopic greater saphenous vein harvest on the right.  Remained on dobutamine intubated was transferred to the ICU.  Hospital course complicated by atrial fibrillation with rapid ventricular response postop day 1 which was treated with amiodarone.  On 01/06/2024 patient developed right lower extremity weakness.  MRI of the brain revealed acute/subacute nonhemorrhagic infarction throughout both cerebral hemispheres and left greater than right cerebellum.  Acute/subacute infarct in the left ACA distribution were seen.  Additionally there were posterior medial parietal lobe infarcts bilaterally left greater than right in addition to acute nonhemorrhagic infarcts in the ACA/MCA watershed territory bilaterally.  Patient was seen by neurology services.  Recommendations were to transition from IV heparin to Eliquis when appropriate as well as low-dose aspirin.  Cardiac rate remains controlled and he continues on amiodarone as directed.  Tolerating a regular consistency diet.  Therapy evaluations completed due to patient's decreased functional mobility was admitted for a comprehensive rehab program.   Review of Systems  Constitutional:  Negative for chills and  fever.  HENT:  Negative for hearing loss.   Eyes:  Negative for blurred vision and double vision.  Respiratory:  Positive for shortness of breath. Negative for wheezing.   Cardiovascular:  Positive for chest pain and leg swelling.  Gastrointestinal:  Positive for constipation. Negative for heartburn and nausea.       GERD  Genitourinary:  Positive for frequency. Negative for dysuria, flank pain and hematuria.  Musculoskeletal:  Positive for joint pain and myalgias.  Skin:  Negative for rash.  Neurological:  Positive for headaches.  All other systems reviewed and are negative.       Past Medical History:  Diagnosis Date   Arthritis     Cancer (HCC)      prostate cancer    Cardiomyopathy (HCC)     Chronic kidney disease, stage 3 (HCC)     Coronary artery disease     ED (erectile dysfunction)     GERD (gastroesophageal reflux disease)     Headache      hx of migraine-2015    Hyperlipidemia     Hypertension     Hypothyroidism     LBBB (left bundle branch block)     Migraine     Nocturia     Overactive bladder     Pneumonia      hx of at age 18              Past Surgical History:  Procedure Laterality Date   CARPAL TUNNEL RELEASE Bilateral     CORONARY ARTERY BYPASS GRAFT N/A 01/04/2024    Procedure: CORONARY ARTERY BYPASS GRAFTING TIMES THREE USING LEFT INTERNAL MAMMARY ARTERY AND RIGHT GREAT SAPHENOUS VEIN HARVESTED ENDOSCOPICALLY;  Surgeon: Corliss Skains, MD;  Location: [facility] OR;  Service: Open Heart Surgery;  Laterality: N/A;   INTRAOPERATIVE TRANSESOPHAGEAL ECHOCARDIOGRAM N/A  01/04/2024    Procedure: ECHOCARDIOGRAM, TRANSESOPHAGEAL, INTRAOPERATIVE;  Surgeon: Corliss Skains, MD;  Location: [facility] OR;  Service: Open Heart Surgery;  Laterality: N/A;   PROSTATE SURGERY       RIGHT/LEFT HEART CATH AND CORONARY ANGIOGRAPHY Bilateral 12/15/2023    Procedure: RIGHT/LEFT HEART CATH AND CORONARY ANGIOGRAPHY;  Surgeon: Yvonne Kendall, MD;  Location: [facility] INVASIVE CV LAB;   Service: Cardiovascular;  Laterality: Bilateral;   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    Procedure: LEFT TOTAL HIP ARTHROPLASTY ANTERIOR APPROACH;  Surgeon: Durene Romans, MD;  Location: [facility] ORS;  Service: Orthopedics;  Laterality: Left;             Family History  Problem Relation Age of Onset   Ovarian cancer Mother     Heart failure Father     COPD Father     Atrial fibrillation Brother     Prostate cancer Brother          Social History:  reports that he has never smoked. He has never used smokeless tobacco. He reports that he does not currently use alcohol. He reports that he does not use drugs. Allergies:  Allergies       Allergies  Allergen Reactions   Atorvastatin        myalgias   Other Reaction(s): myalgias   Pravachol [Pravastatin]        Muscle aches   Simvastatin     Statins Other (See Comments)      Muscle pain   Penicillins Rash      Has patient had a PCN reaction causing immediate rash, facial/tongue/throat swelling, SOB or lightheadedness with hypotension: No Has patient had a PCN reaction causing severe rash involving mucus membranes or skin necrosis: No Has patient had a PCN reaction that required hospitalization No Has patient had a PCN reaction occurring within the last 10 years: No If all of the above answers are "NO", then may proceed with Cephalosporin use.              Medications Prior to Admission  Medication Sig Dispense Refill   amLODipine (NORVASC) 5 MG tablet Take 1 tablet (5 mg total) by mouth daily. 90 tablet 3   Cholecalciferol (VITAMIN D-3) 25 MCG (1000 UT) CAPS Take 1,000 Units by mouth daily.       famotidine (PEPCID) 20 MG tablet Take 20 mg by mouth daily.       furosemide (LASIX) 20 MG tablet Take 1 tablet (20 mg total) by mouth daily as needed (swelling or weight gain (2 pounds in 24 hours or 5 pounds in 1 week)).       levothyroxine (SYNTHROID) 50 MCG tablet Take 50 mcg by mouth daily before breakfast.       MAGNESIUM PO Take  420 mg by mouth daily.       metoprolol succinate (TOPROL-XL) 50 MG 24 hr tablet Take 50 mg by mouth at bedtime. Take with or immediately following a meal.       Multiple Vitamins-Minerals (ICAPS AREDS 2 PO) Take 1 capsule by mouth 2 (two) times daily.       nitroGLYCERIN (NITROSTAT) 0.4 MG SL tablet Place 1 tablet (0.4 mg total) under the tongue every 5 (five) minutes as needed for chest pain. 25 tablet PRN   Omega-3 Fatty Acids (FISH OIL) 1000 MG CAPS Take 1,000 mg by mouth daily.       sacubitril-valsartan (ENTRESTO) 49-51 MG Take 1 tablet by mouth 2 (two) times daily. 180 tablet  3   Turmeric 500 MG CAPS Take 500 mg by mouth 2 (two) times daily.                  Home: Home Living Family/patient expects to be discharged to:: Private residence Living Arrangements: Spouse/significant other, Children Available Help at Discharge: Family, Available 24 hours/day Type of Home: House Home Access: Level entry Home Layout: One level Bathroom Shower/Tub: Health visitor: Standard Home Equipment: None  Lives With: Spouse   Functional History: Prior Function Prior Level of Function : Independent/Modified Independent Mobility Comments: Ind w/ no AD ADLs Comments: Ind   Functional Status:  Mobility: Bed Mobility Overal bed mobility: Needs Assistance Bed Mobility: Rolling, Sidelying to Sit Rolling: Mod assist Sidelying to sit: +2 for physical assistance, Mod assist Supine to sit: Total assist Sit to supine: Max assist, +2 for physical assistance General bed mobility comments: cues for technique, assist to progress to sidelying, for R LE over EOB, to raise trunk and for R hip to EOB Transfers Overall transfer level: Needs assistance Equipment used: 2 person hand held assist, Ambulation equipment used Transfers: Sit to/from Stand Sit to Stand: +2 physical assistance, Mod assist, Via lift equipment, From elevated surface Transfer via Lift Equipment: Stedy General  transfer comment: worked on sit to stand from elevated bed, Data processing manager without stedy, cues for hand placement, use of momentum, posture and control of descent, fall mat placed under LLE to increase weight shift and weight bearing through RLE Ambulation/Gait General Gait Details: unable   ADL: ADL Overall ADL's : Needs assistance/impaired Eating/Feeding: Minimal assistance, Bed level Eating/Feeding Details (indicate cue type and reason): self feeds with R hand and assist to open containers Grooming: Oral care, Sitting Grooming Details (indicate cue type and reason): min assist to set up Toileting- Clothing Manipulation and Hygiene: Total assistance, +2 for physical assistance, Sit to/from stand Toileting - Clothing Manipulation Details (indicate cue type and reason): max to stand, total for pericare General ADL Comments: max to total assist for all ADLs   Cognition: Cognition Overall Cognitive Status: Impaired/Different from baseline Arousal/Alertness: Lethargic Orientation Level: Oriented X4 Attention: Sustained Sustained Attention: Impaired Sustained Attention Impairment: Functional complex Memory: Impaired Memory Impairment: Storage deficit Awareness: Appears intact Problem Solving: Appears intact Safety/Judgment: Appears intact Cognition Arousal: Alert Behavior During Therapy: WFL for tasks assessed/performed Overall Cognitive Status: Impaired/Different from baseline   Physical Exam: Blood pressure (!) 149/66, pulse 67, temperature 98.4 F (36.9 C), temperature source Oral, resp. rate 17, height 5\' 8"  (1.727 m), weight 87.3 kg, SpO2 93%. Physical Exam Constitutional:      General: He is not in acute distress.    Appearance: He is not ill-appearing.     Comments: Appears a bit fatigued  HENT:     Right Ear: Ear canal and external ear normal.     Left Ear: External ear normal.     Nose: Nose normal.     Mouth/Throat:     Mouth: Mucous membranes are  moist.  Eyes:     Extraocular Movements: Extraocular movements intact.     Pupils: Pupils are equal, round, and reactive to light.  Cardiovascular:     Rate and Rhythm: Normal rate and regular rhythm.     Heart sounds: No murmur heard.    No gallop.  Pulmonary:     Effort: Pulmonary effort is normal. No respiratory distress.     Breath sounds: No wheezing.     Comments: Occasional cough Abdominal:  General: Bowel sounds are normal. There is no distension.     Palpations: Abdomen is soft.     Tenderness: There is no abdominal tenderness.  Musculoskeletal:        General: Swelling (trace swelling in hands and feet) present. No tenderness. Normal range of motion.     Cervical back: Normal range of motion.     Right lower leg: Edema (tr to 1+) present.     Left lower leg: No edema.  Skin:    General: Skin is warm.     Comments: Chest incision healing nicely with minimal scar. Donor sites essentially healed RLE  Neurological:     Comments: Patient is alert.  Makes eye contact with examiner follows commands.  He provides name and age but does display some delay in processing and problems solving. STM deficits. CN exam was non-focal today.  RUE 4+/5. LUE 3 to 3+/5 prox to 4- at hand and wrist. RLE 2/5 HF, KE and trace to absent at ankle. LLE 3-4/5 prox to distal. Pt senses light touch and pain fairly equally in all 4's. DTR's brisk and 2+ in all 4's. Toes up right foot.  Psychiatric:        Mood and Affect: Mood normal.        Behavior: Behavior normal.        Lab Results Last 48 Hours        Results for orders placed or performed during the hospital encounter of 01/04/24 (from the past 48 hours)  Glucose, capillary     Status: Abnormal    Collection Time: 01/10/24 12:28 PM  Result Value Ref Range    Glucose-Capillary 143 (H) 70 - 99 mg/dL      Comment: Glucose reference range applies only to samples taken after fasting for at least 8 hours.  Glucose, capillary     Status:  Abnormal    Collection Time: 01/10/24  4:13 PM  Result Value Ref Range    Glucose-Capillary 100 (H) 70 - 99 mg/dL      Comment: Glucose reference range applies only to samples taken after fasting for at least 8 hours.    Comment 1 Notify RN      Comment 2 Document in Chart    Glucose, capillary     Status: Abnormal    Collection Time: 01/10/24  8:42 PM  Result Value Ref Range    Glucose-Capillary 116 (H) 70 - 99 mg/dL      Comment: Glucose reference range applies only to samples taken after fasting for at least 8 hours.  Glucose, capillary     Status: Abnormal    Collection Time: 01/10/24 11:10 PM  Result Value Ref Range    Glucose-Capillary 116 (H) 70 - 99 mg/dL      Comment: Glucose reference range applies only to samples taken after fasting for at least 8 hours.  Glucose, capillary     Status: Abnormal    Collection Time: 01/11/24  3:22 AM  Result Value Ref Range    Glucose-Capillary 101 (H) 70 - 99 mg/dL      Comment: Glucose reference range applies only to samples taken after fasting for at least 8 hours.  CBC     Status: Abnormal    Collection Time: 01/11/24  4:09 AM  Result Value Ref Range    WBC 8.1 4.0 - 10.5 K/uL    RBC 3.36 (L) 4.22 - 5.81 MIL/uL    Hemoglobin 9.9 (L) 13.0 - 17.0 g/dL  HCT 30.1 (L) 39.0 - 52.0 %    MCV 89.6 80.0 - 100.0 fL    MCH 29.5 26.0 - 34.0 pg    MCHC 32.9 30.0 - 36.0 g/dL    RDW 16.1 09.6 - 04.5 %    Platelets 283 150 - 400 K/uL    nRBC 0.0 0.0 - 0.2 %      Comment: Performed at [facility], [address], [place], [place] [address]  Basic metabolic panel     Status: Abnormal    Collection Time: 01/11/24  4:09 AM  Result Value Ref Range    Sodium 141 135 - 145 mmol/L    Potassium 3.9 3.5 - 5.1 mmol/L    Chloride 109 98 - 111 mmol/L    CO2 24 22 - 32 mmol/L    Glucose, Bld 105 (H) 70 - 99 mg/dL      Comment: Glucose reference range applies only to samples taken after fasting for at least 8 hours.    BUN 36 (H) 8 - 23 mg/dL     Creatinine, Ser 1.91 (H) 0.61 - 1.24 mg/dL    Calcium 7.9 (L) 8.9 - 10.3 mg/dL    GFR, Estimated 30 (L) >60 mL/min      Comment: (NOTE) Calculated using the CKD-EPI Creatinine Equation (2021)      Anion gap 8 5 - 15      Comment: Performed at [facility], [address] [place], [place] [address]  Glucose, capillary     Status: None    Collection Time: 01/11/24  8:12 AM  Result Value Ref Range    Glucose-Capillary 94 70 - 99 mg/dL      Comment: Glucose reference range applies only to samples taken after fasting for at least 8 hours.    Comment 1 Notify RN      Comment 2 Document in Chart    Glucose, capillary     Status: Abnormal    Collection Time: 01/11/24 12:01 PM  Result Value Ref Range    Glucose-Capillary 102 (H) 70 - 99 mg/dL      Comment: Glucose reference range applies only to samples taken after fasting for at least 8 hours.    Comment 1 Notify RN      Comment 2 Document in Chart    Glucose, capillary     Status: Abnormal    Collection Time: 01/11/24  4:50 PM  Result Value Ref Range    Glucose-Capillary 129 (H) 70 - 99 mg/dL      Comment: Glucose reference range applies only to samples taken after fasting for at least 8 hours.    Comment 1 Notify RN      Comment 2 Document in Chart    Glucose, capillary     Status: Abnormal    Collection Time: 01/11/24  9:13 PM  Result Value Ref Range    Glucose-Capillary 128 (H) 70 - 99 mg/dL      Comment: Glucose reference range applies only to samples taken after fasting for at least 8 hours.  CBC     Status: Abnormal    Collection Time: 01/12/24  3:20 AM  Result Value Ref Range    WBC 10.5 4.0 - 10.5 K/uL    RBC 3.37 (L) 4.22 - 5.81 MIL/uL    Hemoglobin 9.8 (L) 13.0 - 17.0 g/dL    HCT 56.2 (L) 13.0 - 52.0 %    MCV 90.5 80.0 - 100.0 fL  MCH 29.1 26.0 - 34.0 pg    MCHC 32.1 30.0 - 36.0 g/dL    RDW 16.1 09.6 - 04.5 %    Platelets 324 150 - 400 K/uL    nRBC 0.0 0.0 - 0.2 %      Comment: Performed at [facility], [address] [place], [place] [address]      Imaging Results (Last 48 hours)  No results found.         Blood pressure (!) 149/66, pulse 67, temperature 98.4 F (36.9 C), temperature source Oral, resp. rate 17, height 5\' 8"  (1.727 m), weight 87.3 kg, SpO2 93%.   Medical Problem List and Plan: 1. 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  Sternal precautions             -patient may  shower             -ELOS/Goals: 18-21 days, goals supervision to min assist with PT and OT and mod I to supervision with SLP 2.  Antithrombotics: -DVT/anticoagulation:  Pharmaceutical: Eliquis             -antiplatelet therapy: Aspirin 81 mg daily 3. Pain Management: Oxycodone as needed 4. Mood/Behavior/Sleep: Provide emotional support             -antipsychotic agents: N/A             -pt with persistent fatigue             -record sleep chart             -melatonin prn insomnia 5. Neuropsych/cognition: This patient is capable of making decisions on his own behalf. 6. Skin/Wound Care: Routine skin checks 7. Fluids/Electrolytes/Nutrition: Routine in and outs with follow-up chemistries on Monday  -encourage PO 8.  Postoperative atrial fibrillation.  Amiodarone 400 mg twice daily as well as Lopressor 12.5 mg twice daily.  Will discuss plan to decrease dosage 9.  Acute blood loss anemia.  Latest hemoglobin 9.8.  Follow-up CBC 10.  Hypertension.  Norvasc 10 mg daily.  Monitor with increased mobility 11.  Hyperlipidemia.  Zetia 12.  Hypothyroidism.  Synthroid 13.  CKD stage III.  Creatinine baseline 1.98-2.41  -f/u labs Monday  14.  GERD.  Protonix   Mcarthur Rossetti Angiulli, PA-C 01/12/2024  I have personally performed a face to face diagnostic evaluation of this patient and formulated the key components of the plan.  Additionally, I have personally reviewed laboratory data, imaging studies, as well as relevant notes and  concur with the physician assistant's documentation above.  The patient's status has not changed from the original H&P.  Any changes in documentation from the acute care chart have been noted above.  Ranelle Oyster, MD, [place]

## 2024-01-12 NOTE — Progress Notes (Signed)
Report called to RN on [address] west

## 2024-01-12 NOTE — Discharge Instructions (Addendum)
 Inpatient Rehab Discharge Instructions  [facility] Discharge date and time: No discharge date for patient encounter.   Activities/Precautions/ Functional Status: Activity: sternal precautions Diet: regular diet Wound Care: Routine skin checks Functional status:  ___ No restrictions     ___ Walk up steps independently ___ 24/7 supervision/assistance   ___ Walk up steps with assistance ___ Intermittent supervision/assistance  ___ Bathe/dress independently ___ Walk with walker     __x_ Bathe/dress with assistance ___ Walk Independently    ___ Shower independently ___ Walk with assistance    ___ Shower with assistance ___ No alcohol     ___ Return to work/school ________  Special Instructions:  No driving smoking or alcoholSTROKE/TIA DISCHARGE INSTRUCTIONS SMOKING Cigarette smoking nearly doubles your risk of having a stroke & is the single most alterable risk factor  If you smoke or have smoked in the last 12 months, you are advised to quit smoking for your health. Most of the excess cardiovascular risk related to smoking disappears within a year of stopping. Ask you doctor about anti-smoking medications [place] Quit Line: 1-800-QUIT NOW Free Smoking Cessation Classes [phone]  CHOLESTEROL Know your levels; limit fat & cholesterol in your diet  Lipid Panel     Component Value Date/Time   CHOL 170 01/07/2024 0819   TRIG 159 (H) 01/07/2024 0819   HDL 26 (L) 01/07/2024 0819   CHOLHDL 6.5 01/07/2024 0819   VLDL 32 01/07/2024 0819   LDLCALC 112 (H) 01/07/2024 0819     Many patients benefit from treatment even if their cholesterol is at goal. Goal: Total Cholesterol (CHOL) less than 160 Goal:  Triglycerides (TRIG) less than 150 Goal:  HDL greater than 40 Goal:  LDL (LDLCALC) less than 100   BLOOD PRESSURE American Stroke Association blood pressure target is less that 120/80 mm/Hg  Your discharge blood pressure is:    Monitor your blood pressure Limit your salt and alcohol  intake Many individuals will require more than one medication for high blood pressure  DIABETES (A1c is a blood sugar average for last 3 months) Goal HGBA1c is under 7% (HBGA1c is blood sugar average for last 3 months)  Diabetes: No known diagnosis of diabetes    Lab Results  Component Value Date   HGBA1C 5.4 01/02/2024    Your HGBA1c can be lowered with medications, healthy diet, and exercise. Check your blood sugar as directed by your physician Call your physician if you experience unexplained or low blood sugars.  PHYSICAL ACTIVITY/REHABILITATION Goal is 30 minutes at least 4 days per week  Activity: Increase activity slowly, Therapies: Physical Therapy: Home Health Return to work:  Activity decreases your risk of heart attack and stroke and makes your heart stronger.  It helps control your weight and blood pressure; helps you relax and can improve your mood. Participate in a regular exercise program. Talk with your doctor about the best form of exercise for you (dancing, walking, swimming, cycling).  DIET/WEIGHT Goal is to maintain a healthy weight  Your discharge diet is:  Diet Order             Diet regular Room service appropriate? Yes with Assist; Fluid consistency: Thin  Diet effective now                   liquids Your height is:    Your current weight is:   Your Body Mass Index (BMI) is:    Following the type of diet specifically designed for you will  help prevent another stroke. Your goal weight range is:   Your goal Body Mass Index (BMI) is 19-24. Healthy food habits can help reduce 3 risk factors for stroke:  High cholesterol, hypertension, and excess weight.  RESOURCES Stroke/Support Group:  Call [phone]   STROKE EDUCATION PROVIDED/REVIEWED AND GIVEN TO PATIENT Stroke warning signs and symptoms How to activate emergency medical system (call 911). Medications prescribed at discharge. Need for follow-up after discharge. Personal risk factors for  stroke. Pneumonia vaccine given: No Flu vaccine given: No My questions have been answered, the writing is legible, and I understand these instructions.  I will adhere to these goals & educational materials that have been provided to me after my discharge from the hospital.    COMMUNITY REFERRALS UPON DISCHARGE:    Outpatient: PT            [facility]              Agency:[facility]  Phone:[phone]              Appointment Date/Time:*Please expect follow-up within 7-10 business days of discharge to schedule your appointment. If you have not received follow-up, be sure to contact the site directly. *     My questions have been answered and I understand these instructions. I will adhere to these goals and the provided educational materials after my discharge from the hospital.  Patient/Caregiver Signature _______________________________ Date __________  Clinician Signature _______________________________________ Date __________  Please bring this form and your medication list with you to all your follow-up doctor's appointments.

## 2024-01-12 NOTE — Progress Notes (Signed)
 PMR Admission Coordinator Pre-Admission Assessment   Patient: Tyler David is an 78 y.o., male MRN: [id number] DOB: Jan 26, 1946 Height: 5\' 8"  (172.7 cm) Weight: 88.7 kg                                                                                                                                                  Insurance Information HMO:     PPO: yes     PCP:      IPA:      80/20:      OTHER:  PRIMARY: [employer]      Policy#: [id number], Medicare: [id number]      Subscriber: Pt CM Name: Tyler David      Phone#: [phone]     Fax#: [phone] Pre-Cert#: [id number]   Received approval on 01/11/24. Pt approved for 7 days.    Employer:  Benefits:  Phone #:      Name:  Eff Date: 10/18/2023 - 10/16/2024 Deductible: no deductible ($0) OOP Max: $3,400 ($114.50 met) CIR: $325/day co-pay for days 1-6, $0/day days 7-90 SNF:  $0/day co-pay for days 1-20, $214/day co-pay for days 21-100; limited to 100 days/benefit period Outpatient: $15/visit co-pay; limited by medical necessity Home Health:  100% coverage DME: 75% coverage; 25% co-insurance Providers: in network SECONDARY:       Policy#:       Phone#:    Artist:       Phone#:    The Data processing manager" for patients in Inpatient Rehabilitation Facilities with attached "Privacy Act Statement-Health Care Records" was provided and verbally reviewed with: Family   Emergency Contact Information Contact Information       Name Relation Home Work Mobile    Tyler David Spouse     [phone]         Other Contacts   None on File      Current Medical History  Patient Admitting Diagnosis: CVA following CABG x3 History of Present Illness:  Tyler David is a 78 year old right-handed male with history significant for hypertension, migraine headaches, hyperlipidemia, CKD stage III, prostate cancer, 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  Per chart review patient lives with spouse and family.  1 level home with level entry.  Independent prior to admission.  Presented 01/04/2024 with chest pain and shortness of breath.  Workup suggestive of pulmonary edema.  Echocardiogram revealed ejection fraction of 35 to 40%.  Underwent cardiac catheterization demonstrating multivessel obstructive coronary artery disease.  Underwent CABG x 3 01/04/2024 per Dr. Cliffton Asters with endoscopic greater saphenous vein harvest on the right.  Remained on dobutamine intubated was transferred to the ICU.  Hospital course complicated by atrial fibrillation with rapid ventricular response postop day 1 which was treated with amiodarone.  On 01/06/2024 patient developed right lower extremity weakness.  MRI of the  brain revealed acute/subacute nonhemorrhagic infarction throughout both cerebral hemispheres and left greater than right cerebellum.  Acute/subacute infarct in the left ACA distribution were seen.  Additionally there were posterior medial parietal lobe infarcts bilaterally left greater than right in addition to acute nonhemorrhagic infarcts in the ACA/MCA watershed territory bilaterally.  Patient was seen by neurology services.  Recommendations were to transition from IV heparin to Eliquis when appropriate as well as low-dose aspirin.  Cardiac rate remains controlled and he continues on amiodarone as directed.  Tolerating a regular consistency diet.  Therapy evaluations completed due to patient's decreased functional mobility was admitted for a comprehensive rehab program.  Complete NIHSS TOTAL: 5 Glasgow Coma Scale Score: 15   Patient's medical record from [facility] has been reviewed by the rehabilitation admission coordinator and physician.   Past Medical History      Past Medical History:  Diagnosis Date   Arthritis     Cancer (HCC)      prostate cancer    Cardiomyopathy (HCC)     Chronic kidney disease, stage 3 (HCC)      Coronary artery disease     ED (erectile dysfunction)     GERD (gastroesophageal reflux disease)     Headache      hx of migraine-2015    Hyperlipidemia     Hypertension     Hypothyroidism     LBBB (left bundle branch block)     Migraine     Nocturia     Overactive bladder     Pneumonia      hx of at age 12          Has the patient had major surgery during 100 days prior to admission? Yes   Family History  family history includes Atrial fibrillation in his brother; COPD in his father; Heart failure in his father; Ovarian cancer in his mother; Prostate cancer in his brother.     Current Medications   Current Medications    Current Facility-Administered Medications:    acetaminophen (TYLENOL) tablet 1,000 mg, 1,000 mg, Oral, Q6H, 1,000 mg at 01/09/24 1140 **OR** acetaminophen (TYLENOL) 160 MG/5ML solution 1,000 mg, 1,000 mg, Per Tube, Q6H, Gold, [name], PA-C   albumin human 5 % solution 12.5 g, 250 mL, Intravenous, Q15 min PRN, Gold, [name], PA-C   amiodarone (NEXTERONE) 1.8 mg/mL load via infusion 150 mg, 150 mg, Intravenous, Once, Chestine Spore, [name], DO   amiodarone (PACERONE) tablet 400 mg, 400 mg, Oral, BID, Steffanie Dunn, DO, 400 mg at 01/09/24 1610   amLODipine (NORVASC) tablet 5 mg, 5 mg, Oral, Daily, Lightfoot, Harrell O, MD, 5 mg at 01/09/24 9604   apixaban (ELIQUIS) tablet 5 mg, 5 mg, Oral, BID, Lightfoot, Harrell O, MD, 5 mg at 01/09/24 5409   aspirin EC tablet 81 mg, 81 mg, Oral, Daily, 81 mg at 01/09/24 0918 **OR** aspirin chewable tablet 81 mg, 81 mg, Per Tube, Daily, Lightfoot, Harrell O, MD   bisacodyl (DULCOLAX) EC tablet 10 mg, 10 mg, Oral, Daily, 10 mg at 01/09/24 1408 **OR** bisacodyl (DULCOLAX) suppository 10 mg, 10 mg, Rectal, Daily, Gold, [name], PA-C, 10 mg at 01/07/24 8119   Chlorhexidine Gluconate Cloth 2 % PADS 6 each, 6 each, Topical, Daily, Lightfoot, Harrell O, MD, 6 each at 01/09/24 1478   docusate sodium (COLACE) capsule 200 mg, 200 mg, Oral, Daily,  Gold, [name], PA-C, 200 mg at 01/09/24 1408   ezetimibe (ZETIA) tablet 10 mg, 10 mg,  Oral, Daily, Cheri Fowler, MD, 10 mg at 01/09/24 0917   CBG monitoring, , , Q4H **AND** insulin aspart (novoLOG) injection 0-24 Units, 0-24 Units, Subcutaneous, Q4H, Lightfoot, Harrell O, MD, 2 Units at 01/09/24 1312   levothyroxine (SYNTHROID) tablet 50 mcg, 50 mcg, Oral, QAC breakfast, Gold, [name], PA-C, 50 mcg at 01/09/24 1610   metoprolol tartrate (LOPRESSOR) tablet 12.5 mg, 12.5 mg, Oral, BID, 12.5 mg at 01/09/24 0918 **OR** metoprolol tartrate (LOPRESSOR) 25 mg/10 mL oral suspension 12.5 mg, 12.5 mg, Per Tube, BID, Gold, [name], PA-C   metoprolol tartrate (LOPRESSOR) injection 2.5-5 mg, 2.5-5 mg, Intravenous, Q2H PRN, Gold, [name], PA-C, 5 mg at 01/07/24 0209   morphine (PF) 2 MG/ML injection 1-4 mg, 1-4 mg, Intravenous, Q1H PRN, Gold, [name], PA-C, 2 mg at 01/05/24 0039   naloxone (NARCAN) injection 0.4 mg, 0.4 mg, Intravenous, PRN, Corliss Skains, MD, 0.4 mg at 01/05/24 1451   ondansetron (ZOFRAN) injection 4 mg, 4 mg, Intravenous, Q6H PRN, Gold, [name], PA-C   Oral care mouth rinse, 15 mL, Mouth Rinse, PRN, Lightfoot, Harrell O, MD   oxyCODONE (Oxy IR/ROXICODONE) immediate release tablet 5-10 mg, 5-10 mg, Oral, Q3H PRN, Gold, [name], PA-C, 5 mg at 01/05/24 0415   pantoprazole (PROTONIX) EC tablet 40 mg, 40 mg, Oral, Daily, Lightfoot, Harrell O, MD, 40 mg at 01/09/24 9604     Patients Current Diet:  Diet Order                  Diet regular Room service appropriate? Yes with Assist; Fluid consistency: Thin  Diet effective now                         Precautions / Restrictions Precautions Precautions: Fall, Sternal Precaution Booklet Issued: Yes (comment) Precaution/Restrictions Comments: foley catheter Restrictions Weight Bearing Restrictions Per Provider Order: No RUE Weight Bearing Per Provider Order: Non weight bearing LUE Weight Bearing Per Provider Order: Non weight  bearing Other Position/Activity Restrictions: sternal    Has the patient had 2 or more falls or a fall with injury in the past year?Yes   Prior Activity Level Community (5-7x/wk): Pt. active in the community PTA   Prior Functional Level Prior Function Prior Level of Function : Independent/Modified Independent Mobility Comments: Ind w/ no AD ADLs Comments: Ind   Self Care: Did the patient need help bathing, dressing, using the toilet or eating?  Independent   Indoor Mobility: Did the patient need assistance with walking from room to room (with or without device)? Independent   Stairs: Did the patient need assistance with internal or external stairs (with or without device)? Independent   Functional Cognition: Did the patient need help planning regular tasks such as shopping or remembering to take medications? Independent   Patient Information Are you of Hispanic, Latino/a,or Spanish origin?: A. No, not of Hispanic, Latino/a, or Spanish origin What is your race?: A. White Do you need or want an interpreter to communicate with a doctor or health care staff?: 0. No   Patient's Response To:  Health Literacy and Transportation Is the patient able to respond to health literacy and transportation needs?: Yes Health Literacy - How often do you need to have someone help you when you read instructions, pamphlets, or other written material from your doctor or pharmacy?: Never In the past 12 months, has lack of transportation kept you from medical appointments or from getting medications?: No In the past 12  months, has lack of transportation kept you from meetings, work, or from getting things needed for daily living?: No   Journalist, newspaper / Equipment Home Equipment: None   Prior Device Use: Indicate devices/aids used by the patient prior to current illness, exacerbation or injury? None of the above   Current Functional Level Cognition   Arousal/Alertness: Lethargic Overall  Cognitive Status: Impaired/Different from baseline Orientation Level: Oriented X4 Attention: Sustained Sustained Attention: Impaired Sustained Attention Impairment: Functional complex Memory: Impaired Memory Impairment: Storage deficit Awareness: Appears intact Problem Solving: Appears intact Safety/Judgment: Appears intact    Extremity Assessment (includes Sensation/Coordination)   Upper Extremity Assessment: Right hand dominant, LUE deficits/detail LUE Deficits / Details: 2/5 shoulder, 3-/5 elbow, 3/5 forearm, flexes and extends digits slightly LUE Sensation: decreased proprioception LUE Coordination: decreased fine motor, decreased gross motor  Lower Extremity Assessment: Defer to PT evaluation RLE Deficits / Details: no evidence of quad contraction or ankle DF/PF, reports decreased alertness to light touch at L4 dermatome LLE Deficits / Details: grossly 4/5, alert to light touch     ADLs   Overall ADL's : Needs assistance/impaired General ADL Comments: max to total assist for all ADLs     Mobility   Overal bed mobility: Needs Assistance Bed Mobility: Sit to Supine Rolling: Max assist, +2 for physical assistance Sidelying to sit: Max assist, +2 for physical assistance Supine to sit: Total assist Sit to supine: Max assist, +2 for physical assistance General bed mobility comments: pt received in chair, assisted with returning to sidelying and then rolling over onto back. maxA for LE management back to bed, max verbal cues to keep UEs in front of body     Transfers   Overall transfer level: Needs assistance Equipment used: 2 person hand held assist, Ambulation equipment used Transfers: Sit to/from Stand Sit to Stand: +2 physical assistance, Mod assist, From elevated surface Transfer via Lift Equipment: Stedy General transfer comment: maxAx2 to power up from chair into stedy however modA to power up from seat of steady. PT assisted with R knee extension and provided tactile  cues to quadriceps to promote sensation to promote contraction, completed 5 sit to stands     Ambulation / Gait / Stairs / Wheelchair Mobility   Ambulation/Gait General Gait Details: unable this date     Posture / Balance Dynamic Sitting Balance Sitting balance - Comments: slight L lean, unaware of midline Balance Overall balance assessment: Needs assistance, Mild deficits observed, not formally tested Sitting-balance support: Bilateral upper extremity supported, Feet supported Sitting balance-Leahy Scale: Fair Sitting balance - Comments: slight L lean, unaware of midline Postural control: Posterior lean Standing balance support: Bilateral upper extremity supported, During functional activity, Reliant on assistive device for balance Standing balance-Leahy Scale: Zero Standing balance comment: unable to maintain balance without maxAx2     Special needs/care consideration Skin Surgical Incision: chest; Surgical Incision: leg/righ, Diabetic Management: Novolog 0-24 units every 4 hours and External Urinary Catheter        Previous Home Environment (from acute therapy documentation) Living Arrangements: Spouse/significant other, Children  Lives With: Spouse Available Help at Discharge: Family, Available 24 hours/day Type of Home: House Home Layout: One level Home Access: Level entry Bathroom Shower/Tub: Health visitor: Standard Home Care Services: No   Discharge Living Setting Plans for Discharge Living Setting: House Type of Home at Discharge: House Discharge Home Layout: One level Discharge Home Access: Level entry Discharge Bathroom Shower/Tub: Walk-in shower Discharge Bathroom Toilet: Standard Discharge Bathroom Accessibility: Yes  How Accessible: Accessible via wheelchair, Accessible via walker Does the patient have any problems obtaining your medications?: No   Social/Family/Support Systems Patient Roles: Spouse Contact Information:  [phone] Anticipated Caregiver: Idell Pickles Ability/Limitations of Caregiver: 24/7 Caregiver Availability: 24/7 Discharge Plan Discussed with Primary Caregiver: Yes Is Caregiver In Agreement with Plan?: No Does Caregiver/Family have Issues with Lodging/Transportation while Pt is in Rehab?: No     Goals Patient/Family Goal for Rehab: supervision and min assist  with PT, supervision and min assist with OT, modified independent and supervision with SLP. Expected length of stay: 18-21 days Pt/Family Agrees to Admission and willing to participate: Yes Program Orientation Provided & Reviewed with Pt/Caregiver Including Roles  & Responsibilities: Yes     Decrease burden of Care through IP rehab admission: Not anticipated     Possible need for SNF placement upon discharge:Not anticipated     Patient Condition: This patient's condition remains as documented in the consult dated 01/09/24, in which the Rehabilitation Physician determined and documented that the patient's condition is appropriate for intensive rehabilitative care in an inpatient rehabilitation facility. Will admit to inpatient rehab today.   Preadmission Screen Completed By:  Jeronimo Greaves, CCC-SLP, 01/09/2024 3:27 PM ______________________________________________________________________   Discussed status with Dr. Riley Kill on 01/11/24 at 3:48 PM and received approval for admission today.

## 2024-01-12 NOTE — Progress Notes (Signed)
 Inpatient Rehab Admissions Coordinator:    Pt. To admit to CIR today. RN may call report to [phone].   Pt. To admit to CIR for an estimated 16-18 days with the goal of reaching min A and discharging home with assistance from his wife   Megan Salon, MS, CCC-SLP Rehab Admissions Coordinator  [phone] (celll) [phone] (office)

## 2024-01-12 NOTE — Progress Notes (Addendum)
      [address]       [place] [address]             [phone]      8 Days Post-Op Procedure(s) (LRB): CORONARY ARTERY BYPASS GRAFTING TIMES THREE USING LEFT INTERNAL MAMMARY ARTERY AND RIGHT GREAT SAPHENOUS VEIN HARVESTED ENDOSCOPICALLY (N/A) ECHOCARDIOGRAM, TRANSESOPHAGEAL, INTRAOPERATIVE (N/A)  Subjective:  Patient sitting up in chair.  He feels great and is happy to be starting rehab.  Objective: Vital signs in last 24 hours: Temp:  [98.3 F (36.8 C)-98.7 F (37.1 C)] 98.4 F (36.9 C) (03/28 0237) Pulse Rate:  [67-77] 67 (03/28 0237) Cardiac Rhythm: Normal sinus rhythm (03/27 2150) Resp:  [16-33] 17 (03/28 0253) BP: (143-155)/(60-89) 149/66 (03/28 0237) SpO2:  [93 %-96 %] 93 % (03/28 0237) Weight:  [87.3 kg] 87.3 kg (03/28 0237)  Intake/Output from previous day: 03/27 0701 - 03/28 0700 In: 480 [P.O.:480] Out: 750 [Urine:750]  General appearance: alert, cooperative, and no distress Heart: regular rate and rhythm Lungs: clear to auscultation bilaterally Abdomen: soft, non-tender; bowel sounds normal; no masses,  no organomegaly Extremities: edema + Wound: clean and dryu  Lab Results: Recent Labs    01/11/24 0409 01/12/24 0320  WBC 8.1 10.5  HGB 9.9* 9.8*  HCT 30.1* 30.5*  PLT 283 324   BMET:  Recent Labs    01/10/24 0253 01/11/24 0409  NA 139 141  K 4.0 3.9  CL 109 109  CO2 21* 24  GLUCOSE 91 105*  BUN 41* 36*  CREATININE 2.18* 2.20*  CALCIUM 7.7* 7.9*    PT/INR: No results for input(s): "LABPROT", "INR" in the last 72 hours. ABG    Component Value Date/Time   PHART 7.306 (L) 01/04/2024 2032   HCO3 17.3 (L) 01/04/2024 2032   TCO2 18 (L) 01/04/2024 2032   ACIDBASEDEF 8.0 (H) 01/04/2024 2032   O2SAT 75.7 01/05/2024 0830   CBG (last 3)  Recent Labs    01/11/24 1201 01/11/24 1650 01/11/24 2113  GLUCAP 102* 129* 128*    Assessment/Plan: S/P Procedure(s) (LRB): CORONARY ARTERY BYPASS GRAFTING TIMES THREE USING  LEFT INTERNAL MAMMARY ARTERY AND RIGHT GREAT SAPHENOUS VEIN HARVESTED ENDOSCOPICALLY (N/A) ECHOCARDIOGRAM, TRANSESOPHAGEAL, INTRAOPERATIVE (N/A)  CV-maintaining NSR, on Amiodarone, + HTN-continue Lopressor, Norvasc, Amiodarone  Pulm- no acute issues, continue IS Renal- creatinine has been stable, + edema on exam.. continue Lasix, potassium CBGs controlled, patient not diabetic, no indication for SSIP Deconditioning- CIR today   LOS: 8 days   Lowella Dandy, PA-C 01/12/2024  Agree with above Dispo planning  Stanislawa Gaffin O Kashay Cavenaugh

## 2024-01-12 NOTE — H&P (Signed)
 Physical Medicine and Rehabilitation Admission H&P     HPI: Tyler David is a 78 year old right-handed male with history significant for hypertension, migraine headaches, hyperlipidemia, CKD stage III, prostate cancer, left total hip arthroplasty 09/29/2015, coronary artery calcification/cardiomyopathy/left bundle branch block.  Per chart review patient lives with spouse and family.  1 level home with level entry.  Independent prior to admission.  Presented 01/04/2024 with chest pain and shortness of breath.  Workup suggestive of pulmonary edema.  Echocardiogram revealed ejection fraction of 35 to 40%.  Underwent cardiac catheterization demonstrating multivessel obstructive coronary artery disease.  Underwent CABG x 3 01/04/2024 per Dr. Cliffton Asters with endoscopic greater saphenous vein harvest on the right.  Remained on dobutamine intubated was transferred to the ICU.  Hospital course complicated by atrial fibrillation with rapid ventricular response postop day 1 which was treated with amiodarone.  On 01/06/2024 patient developed right lower extremity weakness.  MRI of the brain revealed acute/subacute nonhemorrhagic infarction throughout both cerebral hemispheres and left greater than right cerebellum.  Acute/subacute infarct in the left ACA distribution were seen.  Additionally there were posterior medial parietal lobe infarcts bilaterally left greater than right in addition to acute nonhemorrhagic infarcts in the ACA/MCA watershed territory bilaterally.  Patient was seen by neurology services.  Recommendations were to transition from IV heparin to Eliquis when appropriate as well as low-dose aspirin.  Cardiac rate remains controlled and he continues on amiodarone as directed.  Tolerating a regular consistency diet.  Therapy evaluations completed due to patient's decreased functional mobility was admitted for a comprehensive rehab program.  Review of Systems  Constitutional:  Negative for chills and  fever.  HENT:  Negative for hearing loss.   Eyes:  Negative for blurred vision and double vision.  Respiratory:  Positive for shortness of breath. Negative for wheezing.   Cardiovascular:  Positive for chest pain and leg swelling.  Gastrointestinal:  Positive for constipation. Negative for heartburn and nausea.       GERD  Genitourinary:  Positive for frequency. Negative for dysuria, flank pain and hematuria.  Musculoskeletal:  Positive for joint pain and myalgias.  Skin:  Negative for rash.  Neurological:  Positive for headaches.  All other systems reviewed and are negative.  Past Medical History:  Diagnosis Date   Arthritis    Cancer (HCC)    prostate cancer    Cardiomyopathy (HCC)    Chronic kidney disease, stage 3 (HCC)    Coronary artery disease    ED (erectile dysfunction)    GERD (gastroesophageal reflux disease)    Headache    hx of migraine-2015    Hyperlipidemia    Hypertension    Hypothyroidism    LBBB (left bundle branch block)    Migraine    Nocturia    Overactive bladder    Pneumonia    hx of at age 67    Past Surgical History:  Procedure Laterality Date   CARPAL TUNNEL RELEASE Bilateral    CORONARY ARTERY BYPASS GRAFT N/A 01/04/2024   Procedure: CORONARY ARTERY BYPASS GRAFTING TIMES THREE USING LEFT INTERNAL MAMMARY ARTERY AND RIGHT GREAT SAPHENOUS VEIN HARVESTED ENDOSCOPICALLY;  Surgeon: Corliss Skains, MD;  Location: [facility] OR;  Service: Open Heart Surgery;  Laterality: N/A;   INTRAOPERATIVE TRANSESOPHAGEAL ECHOCARDIOGRAM N/A 01/04/2024   Procedure: ECHOCARDIOGRAM, TRANSESOPHAGEAL, INTRAOPERATIVE;  Surgeon: Corliss Skains, MD;  Location: [facility] OR;  Service: Open Heart Surgery;  Laterality: N/A;   PROSTATE SURGERY     RIGHT/LEFT HEART CATH AND  CORONARY ANGIOGRAPHY Bilateral 12/15/2023   Procedure: RIGHT/LEFT HEART CATH AND CORONARY ANGIOGRAPHY;  Surgeon: Yvonne Kendall, MD;  Location: [facility] INVASIVE CV LAB;  Service: Cardiovascular;  Laterality:  Bilateral;   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   Procedure: LEFT TOTAL HIP ARTHROPLASTY ANTERIOR APPROACH;  Surgeon: Durene Romans, MD;  Location: [facility] ORS;  Service: Orthopedics;  Laterality: Left;   Family History  Problem Relation Age of Onset   Ovarian cancer Mother    Heart failure Father    COPD Father    Atrial fibrillation Brother    Prostate cancer Brother    Social History:  reports that he has never smoked. He has never used smokeless tobacco. He reports that he does not currently use alcohol. He reports that he does not use drugs. Allergies:  Allergies  Allergen Reactions   Atorvastatin     myalgias  Other Reaction(s): myalgias   Pravachol [Pravastatin]     Muscle aches   Simvastatin    Statins Other (See Comments)    Muscle pain   Penicillins Rash    Has patient had a PCN reaction causing immediate rash, facial/tongue/throat swelling, SOB or lightheadedness with hypotension: No Has patient had a PCN reaction causing severe rash involving mucus membranes or skin necrosis: No Has patient had a PCN reaction that required hospitalization No Has patient had a PCN reaction occurring within the last 10 years: No If all of the above answers are "NO", then may proceed with Cephalosporin use.    Medications Prior to Admission  Medication Sig Dispense Refill   amLODipine (NORVASC) 5 MG tablet Take 1 tablet (5 mg total) by mouth daily. 90 tablet 3   Cholecalciferol (VITAMIN D-3) 25 MCG (1000 UT) CAPS Take 1,000 Units by mouth daily.     famotidine (PEPCID) 20 MG tablet Take 20 mg by mouth daily.     furosemide (LASIX) 20 MG tablet Take 1 tablet (20 mg total) by mouth daily as needed (swelling or weight gain (2 pounds in 24 hours or 5 pounds in 1 week)).     levothyroxine (SYNTHROID) 50 MCG tablet Take 50 mcg by mouth daily before breakfast.     MAGNESIUM PO Take 420 mg by mouth daily.     metoprolol succinate (TOPROL-XL) 50 MG 24 hr tablet Take 50 mg by mouth at bedtime.  Take with or immediately following a meal.     Multiple Vitamins-Minerals (ICAPS AREDS 2 PO) Take 1 capsule by mouth 2 (two) times daily.     nitroGLYCERIN (NITROSTAT) 0.4 MG SL tablet Place 1 tablet (0.4 mg total) under the tongue every 5 (five) minutes as needed for chest pain. 25 tablet PRN   Omega-3 Fatty Acids (FISH OIL) 1000 MG CAPS Take 1,000 mg by mouth daily.     sacubitril-valsartan (ENTRESTO) 49-51 MG Take 1 tablet by mouth 2 (two) times daily. 180 tablet 3   Turmeric 500 MG CAPS Take 500 mg by mouth 2 (two) times daily.        Home: Home Living Family/patient expects to be discharged to:: Private residence Living Arrangements: Spouse/significant other, Children Available Help at Discharge: Family, Available 24 hours/day Type of Home: House Home Access: Level entry Home Layout: One level Bathroom Shower/Tub: Health visitor: Standard Home Equipment: None  Lives With: Spouse   Functional History: Prior Function Prior Level of Function : Independent/Modified Independent Mobility Comments: Ind w/ no AD ADLs Comments: Ind  Functional Status:  Mobility: Bed Mobility Overal bed mobility: Needs Assistance  Bed Mobility: Rolling, Sidelying to Sit Rolling: Mod assist Sidelying to sit: +2 for physical assistance, Mod assist Supine to sit: Total assist Sit to supine: Max assist, +2 for physical assistance General bed mobility comments: cues for technique, assist to progress to sidelying, for R LE over EOB, to raise trunk and for R hip to EOB Transfers Overall transfer level: Needs assistance Equipment used: 2 person hand held assist, Ambulation equipment used Transfers: Sit to/from Stand Sit to Stand: +2 physical assistance, Mod assist, Via lift equipment, From elevated surface Transfer via Lift Equipment: Stedy General transfer comment: worked on sit to stand from elevated bed, Data processing manager without stedy, cues for hand placement, use of  momentum, posture and control of descent, fall mat placed under LLE to increase weight shift and weight bearing through RLE Ambulation/Gait General Gait Details: unable    ADL: ADL Overall ADL's : Needs assistance/impaired Eating/Feeding: Minimal assistance, Bed level Eating/Feeding Details (indicate cue type and reason): self feeds with R hand and assist to open containers Grooming: Oral care, Sitting Grooming Details (indicate cue type and reason): min assist to set up Toileting- Clothing Manipulation and Hygiene: Total assistance, +2 for physical assistance, Sit to/from stand Toileting - Clothing Manipulation Details (indicate cue type and reason): max to stand, total for pericare General ADL Comments: max to total assist for all ADLs  Cognition: Cognition Overall Cognitive Status: Impaired/Different from baseline Arousal/Alertness: Lethargic Orientation Level: Oriented X4 Attention: Sustained Sustained Attention: Impaired Sustained Attention Impairment: Functional complex Memory: Impaired Memory Impairment: Storage deficit Awareness: Appears intact Problem Solving: Appears intact Safety/Judgment: Appears intact Cognition Arousal: Alert Behavior During Therapy: WFL for tasks assessed/performed Overall Cognitive Status: Impaired/Different from baseline  Physical Exam: Blood pressure (!) 149/66, pulse 67, temperature 98.4 F (36.9 C), temperature source Oral, resp. rate 17, height 5\' 8"  (1.727 m), weight 87.3 kg, SpO2 93%. Physical Exam Constitutional:      General: He is not in acute distress.    Appearance: He is not ill-appearing.     Comments: Appears a bit fatigued  HENT:     Right Ear: Ear canal and external ear normal.     Left Ear: External ear normal.     Nose: Nose normal.     Mouth/Throat:     Mouth: Mucous membranes are moist.  Eyes:     Extraocular Movements: Extraocular movements intact.     Pupils: Pupils are equal, round, and reactive to light.   Cardiovascular:     Rate and Rhythm: Normal rate and regular rhythm.     Heart sounds: No murmur heard.    No gallop.  Pulmonary:     Effort: Pulmonary effort is normal. No respiratory distress.     Breath sounds: No wheezing.     Comments: Occasional cough Abdominal:     General: Bowel sounds are normal. There is no distension.     Palpations: Abdomen is soft.     Tenderness: There is no abdominal tenderness.  Musculoskeletal:        General: Swelling (trace swelling in hands and feet) present. No tenderness. Normal range of motion.     Cervical back: Normal range of motion.     Right lower leg: Edema (tr to 1+) present.     Left lower leg: No edema.  Skin:    General: Skin is warm.     Comments: Chest incision healing nicely with minimal scar. Donor sites essentially healed RLE  Neurological:     Comments: Patient  is alert.  Makes eye contact with examiner follows commands.  He provides name and age but does display some delay in processing and problems solving. STM deficits. CN exam was non-focal today.  RUE 4+/5. LUE 3 to 3+/5 prox to 4- at hand and wrist. RLE 2/5 HF, KE and trace to absent at ankle. LLE 3-4/5 prox to distal. Pt senses light touch and pain fairly equally in all 4's. DTR's brisk and 2+ in all 4's. Toes up right foot.  Psychiatric:        Mood and Affect: Mood normal.        Behavior: Behavior normal.     Results for orders placed or performed during the hospital encounter of 01/04/24 (from the past 48 hours)  Glucose, capillary     Status: Abnormal   Collection Time: 01/10/24 12:28 PM  Result Value Ref Range   Glucose-Capillary 143 (H) 70 - 99 mg/dL    Comment: Glucose reference range applies only to samples taken after fasting for at least 8 hours.  Glucose, capillary     Status: Abnormal   Collection Time: 01/10/24  4:13 PM  Result Value Ref Range   Glucose-Capillary 100 (H) 70 - 99 mg/dL    Comment: Glucose reference range applies only to samples taken  after fasting for at least 8 hours.   Comment 1 Notify RN    Comment 2 Document in Chart   Glucose, capillary     Status: Abnormal   Collection Time: 01/10/24  8:42 PM  Result Value Ref Range   Glucose-Capillary 116 (H) 70 - 99 mg/dL    Comment: Glucose reference range applies only to samples taken after fasting for at least 8 hours.  Glucose, capillary     Status: Abnormal   Collection Time: 01/10/24 11:10 PM  Result Value Ref Range   Glucose-Capillary 116 (H) 70 - 99 mg/dL    Comment: Glucose reference range applies only to samples taken after fasting for at least 8 hours.  Glucose, capillary     Status: Abnormal   Collection Time: 01/11/24  3:22 AM  Result Value Ref Range   Glucose-Capillary 101 (H) 70 - 99 mg/dL    Comment: Glucose reference range applies only to samples taken after fasting for at least 8 hours.  CBC     Status: Abnormal   Collection Time: 01/11/24  4:09 AM  Result Value Ref Range   WBC 8.1 4.0 - 10.5 K/uL   RBC 3.36 (L) 4.22 - 5.81 MIL/uL   Hemoglobin 9.9 (L) 13.0 - 17.0 g/dL   HCT 16.1 (L) 09.6 - 04.5 %   MCV 89.6 80.0 - 100.0 fL   MCH 29.5 26.0 - 34.0 pg   MCHC 32.9 30.0 - 36.0 g/dL   RDW 40.9 81.1 - 91.4 %   Platelets 283 150 - 400 K/uL   nRBC 0.0 0.0 - 0.2 %    Comment: Performed at [facility], [address], [place], [place] [address]  Basic metabolic panel     Status: Abnormal   Collection Time: 01/11/24  4:09 AM  Result Value Ref Range   Sodium 141 135 - 145 mmol/L   Potassium 3.9 3.5 - 5.1 mmol/L   Chloride 109 98 - 111 mmol/L   CO2 24 22 - 32 mmol/L   Glucose, Bld 105 (H) 70 - 99 mg/dL    Comment: Glucose reference range applies only to samples taken after fasting for at least 8 hours.  BUN 36 (H) 8 - 23 mg/dL   Creatinine, Ser 1.61 (H) 0.61 - 1.24 mg/dL   Calcium 7.9 (L) 8.9 - 10.3 mg/dL   GFR, Estimated 30 (L) >60 mL/min    Comment: (NOTE) Calculated using the CKD-EPI Creatinine Equation (2021)    Anion gap 8 5 - 15     Comment: Performed at [facility], [address], [place], [place] [address]  Glucose, capillary     Status: None   Collection Time: 01/11/24  8:12 AM  Result Value Ref Range   Glucose-Capillary 94 70 - 99 mg/dL    Comment: Glucose reference range applies only to samples taken after fasting for at least 8 hours.   Comment 1 Notify RN    Comment 2 Document in Chart   Glucose, capillary     Status: Abnormal   Collection Time: 01/11/24 12:01 PM  Result Value Ref Range   Glucose-Capillary 102 (H) 70 - 99 mg/dL    Comment: Glucose reference range applies only to samples taken after fasting for at least 8 hours.   Comment 1 Notify RN    Comment 2 Document in Chart   Glucose, capillary     Status: Abnormal   Collection Time: 01/11/24  4:50 PM  Result Value Ref Range   Glucose-Capillary 129 (H) 70 - 99 mg/dL    Comment: Glucose reference range applies only to samples taken after fasting for at least 8 hours.   Comment 1 Notify RN    Comment 2 Document in Chart   Glucose, capillary     Status: Abnormal   Collection Time: 01/11/24  9:13 PM  Result Value Ref Range   Glucose-Capillary 128 (H) 70 - 99 mg/dL    Comment: Glucose reference range applies only to samples taken after fasting for at least 8 hours.  CBC     Status: Abnormal   Collection Time: 01/12/24  3:20 AM  Result Value Ref Range   WBC 10.5 4.0 - 10.5 K/uL   RBC 3.37 (L) 4.22 - 5.81 MIL/uL   Hemoglobin 9.8 (L) 13.0 - 17.0 g/dL   HCT 54.0 (L) 98.1 - 19.1 %   MCV 90.5 80.0 - 100.0 fL   MCH 29.1 26.0 - 34.0 pg   MCHC 32.1 30.0 - 36.0 g/dL   RDW 47.8 29.5 - 62.1 %   Platelets 324 150 - 400 K/uL   nRBC 0.0 0.0 - 0.2 %    Comment: Performed at [facility], [address] [place], [place] [address]   No results found.    Blood pressure (!) 149/66, pulse 67, temperature 98.4 F (36.9 C), temperature source Oral, resp. rate 17, height 5\' 8"  (1.727 m), weight 87.3 kg, SpO2 93%.  Medical Problem List and  Plan: 1. 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  Sternal precautions  -patient may  shower  -ELOS/Goals: 18-21 days, goals supervision to min assist with PT and OT and mod I to supervision with SLP 2.  Antithrombotics: -DVT/anticoagulation:  Pharmaceutical: Eliquis  -antiplatelet therapy: Aspirin 81 mg daily 3. Pain Management: Oxycodone as needed 4. Mood/Behavior/Sleep: Provide emotional support  -antipsychotic agents: N/A  -pt with persistent fatigue  -record sleep chart  -melatonin prn insomnia 5. Neuropsych/cognition: This patient is capable of making decisions on his own behalf. 6. Skin/Wound Care: Routine skin checks 7. Fluids/Electrolytes/Nutrition: Routine in and outs with follow-up chemistries on Monday 8.  Postoperative atrial fibrillation.  Amiodarone 400 mg twice daily as well as Lopressor 12.5 mg twice daily.  Will discuss plan to decrease dosage 9.  Acute blood loss anemia.  Latest hemoglobin 9.8.  Follow-up CBC 10.  Hypertension.  Norvasc 10 mg daily.  Monitor with increased mobility 11.  Hyperlipidemia.  Zetia 12.  Hypothyroidism.  Synthroid 13.  CKD stage III.  Creatinine baseline 1.98-2.41 14.  GERD.  Protonix  Charlton Amor, PA-C 01/12/2024

## 2024-01-12 NOTE — Progress Notes (Signed)
 Patient says he is not a diabetic and has requested we no longer do finger sticks to check his glucose level. He also says he expects to discharge to CIR in the morning and would like to get uninterrupted sleep tonight. I have advised the nurse tech not to check his vitals again until morning.

## 2024-01-12 NOTE — Progress Notes (Signed)
 Inpatient Rehabilitation Admission Medication Review by a Pharmacist  A complete drug regimen review was completed for this patient to identify any potential clinically significant medication issues.  High Risk Drug Classes Is patient taking? Indication by Medication  Antipsychotic No   Anticoagulant Yes Apixaban - Afib  Antibiotic No   Opioid Yes Oxycodone- pain   Antiplatelet Yes Aspirin - CAD s/p CABG, CVA prophylaxis  Hypoglycemics/insulin No   Vasoactive Medication Yes Amiodarone- Afib Amlodipine, Metoprolol tartrate- HTN  Chemotherapy No   Other Yes Bisacodyl, docusate- bowel regimen for constipation Ezetimibe- HLD Levothyroxine- hypothyroid Melatonin- sleep Pantoprozole - acid reflux     Type of Medication Issue Identified Description of Issue Recommendation(s)  Drug Interaction(s) (clinically significant)     Duplicate Therapy     Allergy     No Medication Administration End Date     Incorrect Dose     Additional Drug Therapy Needed     Significant med changes from prior encounter (inform family/care partners about these prior to discharge). PTA medication:  Entresto  (sacubitril-valsartan) 49-51 MG was discontinued on [facility] acute admission/discharge 01/12/24. Restart PTA meds when and if necessary during CIR admission or at time of discharge, if warranted .  Communicate to patient /family/ caregiver prior to discharge.  Other       Clinically significant medication issues were identified that warrant physician communication and completion of prescribed/recommended actions by midnight of the next day:  No  Name of provider notified for urgent issues identified:   Provider Method of Notification:    Pharmacist comments:   Time spent performing this drug regimen review (minutes):  20   Noah Delaine, [place] Clinical Pharmacist  01/12/2024 3:53 PM

## 2024-01-13 DIAGNOSIS — I639 Cerebral infarction, unspecified: Secondary | ICD-10-CM

## 2024-01-13 LAB — CBC WITH DIFFERENTIAL/PLATELET
Abs Immature Granulocytes: 0.06 10*3/uL (ref 0.00–0.07)
Basophils Absolute: 0.1 10*3/uL (ref 0.0–0.1)
Basophils Relative: 1 %
Eosinophils Absolute: 0.6 10*3/uL — ABNORMAL HIGH (ref 0.0–0.5)
Eosinophils Relative: 5 %
HCT: 29.5 % — ABNORMAL LOW (ref 39.0–52.0)
Hemoglobin: 9.5 g/dL — ABNORMAL LOW (ref 13.0–17.0)
Immature Granulocytes: 1 %
Lymphocytes Relative: 8 %
Lymphs Abs: 0.9 10*3/uL (ref 0.7–4.0)
MCH: 29.3 pg (ref 26.0–34.0)
MCHC: 32.2 g/dL (ref 30.0–36.0)
MCV: 91 fL (ref 80.0–100.0)
Monocytes Absolute: 1.4 10*3/uL — ABNORMAL HIGH (ref 0.1–1.0)
Monocytes Relative: 11 %
Neutro Abs: 9.1 10*3/uL — ABNORMAL HIGH (ref 1.7–7.7)
Neutrophils Relative %: 74 %
Platelets: 345 10*3/uL (ref 150–400)
RBC: 3.24 MIL/uL — ABNORMAL LOW (ref 4.22–5.81)
RDW: 14.3 % (ref 11.5–15.5)
WBC: 12.2 10*3/uL — ABNORMAL HIGH (ref 4.0–10.5)
nRBC: 0 % (ref 0.0–0.2)

## 2024-01-13 LAB — COMPREHENSIVE METABOLIC PANEL WITH GFR
ALT: 21 U/L (ref 0–44)
AST: 30 U/L (ref 15–41)
Albumin: 2.6 g/dL — ABNORMAL LOW (ref 3.5–5.0)
Alkaline Phosphatase: 46 U/L (ref 38–126)
Anion gap: 9 (ref 5–15)
BUN: 40 mg/dL — ABNORMAL HIGH (ref 8–23)
CO2: 20 mmol/L — ABNORMAL LOW (ref 22–32)
Calcium: 7.9 mg/dL — ABNORMAL LOW (ref 8.9–10.3)
Chloride: 111 mmol/L (ref 98–111)
Creatinine, Ser: 2.08 mg/dL — ABNORMAL HIGH (ref 0.61–1.24)
GFR, Estimated: 32 mL/min — ABNORMAL LOW (ref >60)
Glucose, Bld: 95 mg/dL (ref 70–99)
Potassium: 4.4 mmol/L (ref 3.5–5.1)
Sodium: 140 mmol/L (ref 135–145)
Total Bilirubin: 0.6 mg/dL (ref 0.0–1.2)
Total Protein: 6 g/dL — ABNORMAL LOW (ref 6.5–8.1)

## 2024-01-13 NOTE — Evaluation (Signed)
 Occupational Therapy Assessment and Plan  Patient Details  Name: Tyler David MRN: [id number] Date of Birth: 1946/01/28  OT Diagnosis: abnormal posture, flaccid hemiplegia and hemiparesis, muscle weakness (generalized), and decreased activity tolerance and balance strategies Rehab Potential: Rehab Potential (ACUTE ONLY): Good ELOS: 17-21 days   Today's Date: 01/13/2024 OT Individual Time: 1052-1204 OT Individual Time Calculation (min): 72 min     Hospital Problem: Principal Problem:   Cardioembolic stroke [facility])   Past Medical History:  Past Medical History:  Diagnosis Date   Arthritis    Cancer (HCC)    prostate cancer    Cardiomyopathy (HCC)    Chronic kidney disease, stage 3 (HCC)    Coronary artery disease    ED (erectile dysfunction)    GERD (gastroesophageal reflux disease)    Headache    hx of migraine-2015    Hyperlipidemia    Hypertension    Hypothyroidism    LBBB (left bundle branch block)    Migraine    Nocturia    Overactive bladder    Pneumonia    hx of at age 45    Past Surgical History:  Past Surgical History:  Procedure Laterality Date   CARPAL TUNNEL RELEASE Bilateral    CORONARY ARTERY BYPASS GRAFT N/A 01/04/2024   Procedure: CORONARY ARTERY BYPASS GRAFTING TIMES THREE USING LEFT INTERNAL MAMMARY ARTERY AND RIGHT GREAT SAPHENOUS VEIN HARVESTED ENDOSCOPICALLY;  Surgeon: Corliss Skains, MD;  Location: [facility] OR;  Service: Open Heart Surgery;  Laterality: N/A;   INTRAOPERATIVE TRANSESOPHAGEAL ECHOCARDIOGRAM N/A 01/04/2024   Procedure: ECHOCARDIOGRAM, TRANSESOPHAGEAL, INTRAOPERATIVE;  Surgeon: Corliss Skains, MD;  Location: [facility] OR;  Service: Open Heart Surgery;  Laterality: N/A;   PROSTATE SURGERY     RIGHT/LEFT HEART CATH AND CORONARY ANGIOGRAPHY Bilateral 12/15/2023   Procedure: RIGHT/LEFT HEART CATH AND CORONARY ANGIOGRAPHY;  Surgeon: Yvonne Kendall, MD;  Location: [facility] INVASIVE CV LAB;  Service: Cardiovascular;  Laterality: Bilateral;    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   Procedure: LEFT TOTAL HIP ARTHROPLASTY ANTERIOR APPROACH;  Surgeon: Durene Romans, MD;  Location: [facility] ORS;  Service: Orthopedics;  Laterality: Left;    Assessment & Plan Clinical Impression: Patient is is a 78 year old right-handed male with history significant for hypertension, migraine headaches, hyperlipidemia, CKD stage III, prostate cancer, left total hip arthroplasty 09/29/2015, coronary artery calcification/cardiomyopathy/left bundle branch block. Per chart review patient lives with spouse and family. 1 level home with level entry. Independent prior to admission. Presented 01/04/2024 with chest pain and shortness of breath. Workup suggestive of pulmonary edema. Echocardiogram revealed ejection fraction of 35 to 40%. Underwent cardiac catheterization demonstrating multivessel obstructive coronary artery disease. Underwent CABG x 3 01/04/2024 per Dr. Cliffton Asters with endoscopic greater saphenous vein harvest on the right. Remained on dobutamine intubated was transferred to the ICU. Hospital course complicated by atrial fibrillation with rapid ventricular response postop day 1 which was treated with amiodarone. On 01/06/2024 patient developed right lower extremity weakness. MRI of the brain revealed acute/subacute nonhemorrhagic infarction throughout both cerebral hemispheres and left greater than right cerebellum. Acute/subacute infarct in the left ACA distribution were seen. Additionally there were posterior medial parietal lobe infarcts bilaterally left greater than right in addition to acute nonhemorrhagic infarcts in the ACA/MCA watershed territory bilaterally. Patient was seen by neurology services. Recommendations were to transition from IV heparin to Eliquis when appropriate as well as low-dose aspirin. Cardiac rate remains controlled and he continues on amiodarone as directed. Tolerating a regular consistency diet.  Patient transferred to CIR on  01/12/2024 .     Patient currently requires mod with basic self-care skills secondary to muscle weakness and muscle joint tightness, decreased cardiorespiratoy endurance, impaired timing and sequencing, abnormal tone, unbalanced muscle activation, and decreased coordination, and decreased standing balance, decreased postural control, decreased balance strategies, and hemiparesis .  Prior to hospitalization, patient could complete BADLs independently.   Patient will benefit from skilled intervention to decrease level of assist with basic self-care skills prior to discharge home with care partner.  Anticipate patient will require intermittent supervision and follow up home health.  OT - End of Session Activity Tolerance: Tolerates < 10 min activity with changes in vital signs Endurance Deficit: Yes OT Assessment Rehab Potential (ACUTE ONLY): Good OT Barriers to Discharge: Wound Care;Weight bearing restrictions OT Patient demonstrates impairments in the following area(s): Balance;Cognition;Edema;Endurance;Motor;Pain;Perception;Safety;Sensory;Skin Integrity OT Basic ADL's Functional Problem(s): Bathing;Dressing;Toileting OT Transfers Functional Problem(s): Toilet;Tub/Shower OT Additional Impairment(s): Fuctional Use of Upper Extremity OT Plan OT Intensity: Minimum of 1-2 x/day, 45 to 90 minutes OT Frequency: 5 out of 7 days OT Duration/Estimated Length of Stay: 17-21 days OT Treatment/Interventions: Balance/vestibular training;Cognitive remediation/compensation;Community reintegration;Discharge planning;Disease mangement/prevention;DME/adaptive equipment instruction;Functional electrical stimulation;Functional mobility training;Neuromuscular re-education;Psychosocial support;Patient/family education;Pain management;Self Care/advanced ADL retraining;Splinting/orthotics;UE/LE Strength taining/ROM;Wheelchair propulsion/positioning;Visual/perceptual remediation/compensation;Skin care/wound Risk analyst;Therapeutic  Exercise;Therapeutic Activities;UE/LE Coordination activities OT Basic Self-Care Anticipated Outcome(s): Mod I OT Toileting Anticipated Outcome(s): Mod I OT Bathroom Transfers Anticipated Outcome(s): Mod I OT Recommendation Patient destination: Home Follow Up Recommendations: Home health OT Equipment Recommended: To be determined  OT Evaluation Precautions/Restrictions  Precautions Precautions: Fall;Sternal Precaution/Restrictions Comments: R-hemi, LUE weakness, Monitor vitals General Chart Reviewed: Yes Family/Caregiver Present: Yes (Spouse) Vital Signs   Pain Pain Assessment Pain Scale: 0-10 Pain Score: 0-No pain Home Living/Prior Functioning Home Living Available Help at Discharge: Family, Available 24 hours/day Type of Home: House Home Access: Level entry Home Layout: One level Bathroom Shower/Tub: Walk-in shower (Detachable shower head) Bathroom Toilet: Pharmacist, community: Yes  Lives With: Spouse, Daughter Prior Function Level of Independence: Independent with basic ADLs, Independent with homemaking with ambulation, Independent with gait, Independent with transfers Driving: Yes Vision Baseline Vision/History: 1 Wears glasses Ability to See in Adequate Light: 0 Adequate Patient Visual Report: No change from baseline Vision Assessment?: No apparent visual deficits Perception  Perception: Within Functional Limits Praxis Praxis: WFL Cognition Cognition Overall Cognitive Status: Impaired/Different from baseline Arousal/Alertness: Awake/alert Memory: Appears intact Attention: Sustained Sustained Attention: Appears intact Awareness: Appears intact Problem Solving: Impaired Problem Solving Impairment: Verbal complex;Functional complex Safety/Judgment: Appears intact Brief Interview for Mental Status (BIMS) Repetition of Three Words (First Attempt): 3 Temporal Orientation: Year: Correct Temporal Orientation: Month: Accurate within 5 days Temporal  Orientation: Day: Correct Recall: "Sock": No, could not recall Recall: "Blue": No, could not recall Recall: "Bed": Yes, after cueing ("a piece of furniture") BIMS Summary Score: 10 Sensation Sensation Light Touch: Impaired Detail Peripheral sensation comments: Reports of residual paresthesia in R hand since carpal tunnel surgery. Light Touch Impaired Details: Impaired RUE Hot/Cold: Appears Intact Proprioception: Impaired by gross assessment Stereognosis: Not tested Coordination Gross Motor Movements are Fluid and Coordinated: No Fine Motor Movements are Fluid and Coordinated: No Coordination and Movement Description: Deficits due to hemiparesis and as limited by sternal precautions. Dysmetria in B-hands. Motor  Motor Motor: Other (comment) Motor - Skilled Clinical Observations: Deficits due to hemiparesis and as limited by sternal precautions. Dysmetria in B-hands.  Trunk/Postural Assessment  Cervical Assessment Cervical Assessment: Exceptions to [facility] (Forward head) Thoracic Assessment Thoracic Assessment: Exceptions to [facility] (Rounded shoulders) Lumbar Assessment Lumbar  Assessment: Exceptions to [facility] (Posterior pelvic tilt) Postural Control Postural Control: Deficits on evaluation Righting Reactions: Delayed Protective Responses: Delayed  Balance Balance Balance Assessed: Yes Static Sitting Balance Static Sitting - Balance Support: Feet supported Static Sitting - Level of Assistance: 5: Stand by assistance (SUP) Dynamic Sitting Balance Dynamic Sitting - Balance Support: Feet supported;During functional activity Dynamic Sitting - Level of Assistance: 5: Stand by assistance (SUP-CGA) Dynamic Sitting - Balance Activities: Forward lean/weight shifting;Reaching for objects;Lateral lean/weight shifting Static Standing Balance Static Standing - Balance Support: No upper extremity supported;During functional activity Static Standing - Level of Assistance: 4: Min assist Dynamic  Standing Balance Dynamic Standing - Balance Support: No upper extremity supported;During functional activity Dynamic Standing - Level of Assistance: 4: Min assist;3: Mod assist Dynamic Standing - Balance Activities: Lateral lean/weight shifting;Forward lean/weight shifting;Reaching for objects Extremity/Trunk Assessment RUE Assessment RUE Assessment: Exceptions to [facility] General Strength Comments: 3-/5; Dysmetria RUE Body System: Neuro Brunstrum levels for arm and hand: Arm;Hand Brunstrum level for arm: Stage V Relative Independence from Synergy Brunstrum level for hand: Stage VI Isolated joint movements LUE Assessment LUE Assessment: Exceptions to [facility] General Strength Comments: 3-/5  Care Tool Care Tool Self Care Eating   Eating Assist Level: Set up assist    Oral Care    Oral Care Assist Level: Set up assist    Bathing   Body parts bathed by patient: Right arm;Left arm;Chest;Abdomen;Front perineal area;Face;Right upper leg;Left upper leg Body parts bathed by helper: Buttocks;Right lower leg;Left lower leg   Assist Level: Moderate Assistance - Patient 50 - 74%    Upper Body Dressing(including orthotics)   What is the patient wearing?: Pull over shirt   Assist Level: Set up assist    Lower Body Dressing (excluding footwear)   What is the patient wearing?: Underwear/pull up;Pants Assist for lower body dressing: Maximal Assistance - Patient 25 - 49%    Putting on/Taking off footwear   What is the patient wearing?: Shoes;Ted hose Assist for footwear: Maximal Assistance - Patient 25 - 49%       Care Tool Toileting Toileting activity   Assist for toileting: Maximal Assistance - Patient 25 - 49%     Care Tool Bed Mobility Roll left and right activity        Sit to lying activity        Lying to sitting on side of bed activity         Care Tool Transfers Sit to stand transfer        Chair/bed transfer         Toilet transfer   Assist Level: Moderate  Assistance - Patient 50 - 74%     Care Tool Cognition  Expression of Ideas and Wants Expression of Ideas and Wants: 4. Without difficulty (complex and basic) - expresses complex messages without difficulty and with speech that is clear and easy to understand  Understanding Verbal and Non-Verbal Content Understanding Verbal and Non-Verbal Content: 4. Understands (complex and basic) - clear comprehension without cues or repetitions   Memory/Recall Ability Memory/Recall Ability : Current season;That he or she is in a hospital/hospital unit   Refer to Care Plan for Long Term Goals  SHORT TERM GOAL WEEK 1 OT Short Term Goal 1 (Week 1): Pt will perform toilet transfers with consistent Min A + LRAD. OT Short Term Goal 2 (Week 1): Pt will thread LB garments with supervision + LRAD. OT Short Term Goal 3 (Week 1): Pt will tolerate therapuetic activities >2  mins with decreased SOB.  Recommendations for other services: None    Skilled Therapeutic Intervention  Session began with introduction to OT role, OT POC, and general orientation to rehab unit/schedule. Pt completes full-body bathing at shower level with assistance provided noted below. Pt requires Mod verbal cuing for pursued-lipped breathing to address SOB. Pt remained sitting in WC, spouse present.  ADL ADL Eating: Supervision/safety Where Assessed-Eating: Bed level;Wheelchair Grooming: Setup;Supervision/safety Where Assessed-Grooming: Careers adviser Bathing: Supervision/safety;Setup Where Assessed-Upper Body Bathing: Shower Lower Body Bathing: Moderate assistance Where Assessed-Lower Body Bathing: Shower Upper Body Dressing: Supervision/safety;Setup Where Assessed-Upper Body Dressing: Wheelchair Lower Body Dressing: Maximal assistance Where Assessed-Lower Body Dressing: Wheelchair Toileting: Moderate assistance;Maximal assistance Where Assessed-Toileting: Toilet;Bedside Commode Toilet Transfer: Moderate assistance;Minimal  assistance Toilet Transfer Method: Stand pivot Toilet Transfer Equipment: Bedside commode;Grab bars Tub/Shower Transfer: Unable to assess Film/video editor: Moderate assistance;Minimal assistance Film/video editor Method: Warden/ranger: Event organiser  Transfers Sit to Stand: Moderate Assistance - Patient 50-74%;Minimal Assistance - Patient > 75% Stand to Sit: Minimal Assistance - Patient > 75%;Moderate Assistance - Patient 50-74%   Discharge Criteria: Patient will be discharged from OT if patient refuses treatment 3 consecutive times without medical reason, if treatment goals not met, if there is a change in medical status, if patient makes no progress towards goals or if patient is discharged from hospital.  The above assessment, treatment plan, treatment alternatives and goals were discussed and mutually agreed upon: by patient  Lou Cal, OTR/L, MSOT  01/13/2024, 12:48 PM

## 2024-01-13 NOTE — Plan of Care (Signed)
  Problem: RH Balance Goal: LTG Patient will maintain dynamic standing with ADLs (OT) Description: LTG:  Patient will maintain dynamic standing balance with assist during activities of daily living (OT)  Flowsheets (Taken 01/13/2024 1254) LTG: Pt will maintain dynamic standing balance during ADLs with: Independent with assistive device   Problem: Sit to Stand Goal: LTG:  Patient will perform sit to stand in prep for activites of daily living with assistance level (OT) Description: LTG:  Patient will perform sit to stand in prep for activites of daily living with assistance level (OT) Flowsheets (Taken 01/13/2024 1254) LTG: PT will perform sit to stand in prep for activites of daily living with assistance level: Independent with assistive device   Problem: RH Bathing Goal: LTG Patient will bathe all body parts with assist levels (OT) Description: LTG: Patient will bathe all body parts with assist levels (OT) Flowsheets (Taken 01/13/2024 1254) LTG: Pt will perform bathing with assistance level/cueing: Independent with assistive device    Problem: RH Dressing Goal: LTG Patient will perform upper body dressing (OT) Description: LTG Patient will perform upper body dressing with assist, with/without cues (OT). Flowsheets (Taken 01/13/2024 1254) LTG: Pt will perform upper body dressing with assistance level of: Independent with assistive device Goal: LTG Patient will perform lower body dressing w/assist (OT) Description: LTG: Patient will perform lower body dressing with assist, with/without cues in positioning using equipment (OT) Flowsheets (Taken 01/13/2024 1254) LTG: Pt will perform lower body dressing with assistance level of: Independent with assistive device   Problem: RH Toileting Goal: LTG Patient will perform toileting task (3/3 steps) with assistance level (OT) Description: LTG: Patient will perform toileting task (3/3 steps) with assistance level (OT)  Flowsheets (Taken 01/13/2024  1254) LTG: Pt will perform toileting task (3/3 steps) with assistance level: Independent with assistive device   Problem: RH Functional Use of Upper Extremity Goal: LTG Patient will use RT/LT upper extremity as a (OT) Description: LTG: Patient will use right/left upper extremity as a stabilizer/gross assist/diminished/nondominant/dominant level with assist, with/without cues during functional activity (OT) Flowsheets (Taken 01/13/2024 1254) LTG: Use of upper extremity in functional activities:  RUE as nondominant level  LUE as nondominant level LTG: Pt will use upper extremity in functional activity with assistance level of: Independent with assistive device   Problem: RH Toilet Transfers Goal: LTG Patient will perform toilet transfers w/assist (OT) Description: LTG: Patient will perform toilet transfers with assist, with/without cues using equipment (OT) Flowsheets (Taken 01/13/2024 1254) LTG: Pt will perform toilet transfers with assistance level of: Independent with assistive device   Problem: RH Tub/Shower Transfers Goal: LTG Patient will perform tub/shower transfers w/assist (OT) Description: LTG: Patient will perform tub/shower transfers with assist, with/without cues using equipment (OT) Flowsheets (Taken 01/13/2024 1254) LTG: Pt will perform tub/shower stall transfers with assistance level of: Supervision/Verbal cueing LTG: Pt will perform tub/shower transfers from: Walk in shower

## 2024-01-13 NOTE — Plan of Care (Signed)
  Problem: RH Swallowing Goal: LTG Patient will consume least restrictive diet using compensatory strategies with assistance (SLP) Description: LTG:  Patient will consume least restrictive diet using compensatory strategies with assistance (SLP) Flowsheets (Taken 01/13/2024 1038) LTG: Pt Patient will consume least restrictive diet using compensatory strategies with assistance of (SLP): Modified Independent   Problem: RH Problem Solving Goal: LTG Patient will demonstrate problem solving for (SLP) Description: LTG:  Patient will demonstrate problem solving for basic/complex daily situations with cues  (SLP) Flowsheets (Taken 01/13/2024 1038) LTG: Patient will demonstrate problem solving for (SLP): (mildly complex) Other (comment) LTG Patient will demonstrate problem solving for: Modified Independent

## 2024-01-13 NOTE — Discharge Summary (Signed)
 Physician Discharge Summary  Patient ID: Tyler David MRN: [id number] DOB/AGE: 05/30/1946 78 y.o.  Admit date: 01/12/2024 Discharge date: 01/24/2024  Discharge Diagnoses:  Principal Problem:   Cardioembolic stroke [facility]) CAD/CABG Postoperative atrial fibrillation Acute blood loss anemia Hypertension Hyperlipidemia Hypothyroidism CKD stage III GERD Migraine headaches Prostate cancer  Discharged Condition: Stable  Significant Diagnostic Studies: DG Swallowing Func-Speech Pathology Result Date: 01/07/2024 Table formatting from the original result was not included. Modified Barium Swallow Study Patient Details Name: Tyler David MRN: [id number] Date of Birth: [birth year] Today's Date: 01/07/2024 HPI/PMH: HPI: Patient is a 78 y.o. male with PMH: GERD, HLD, HTN, CKD stage III, arthritis, prostate cancer. He was recently diagnosed with CHF and placed on diuretics. He underwent CABG x3 on 01/05/24 and extubated post-surgery to BiPAP and progressed to oxygen via [place]. Following surgery, he was noted to have right leg weakness with intact sensation. 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 MRI brain pending. He failed Yale swallow with RN on 3/22 and was kept NPO awaiting SLP swallow evaluation. Clinical Impression: Pt exhibits mild oropharyngeal dysphagia characterized by deficits primarily related to timing. He achieves timely and complete oral clearance. Suspected cervical osteophytes prohibit complete epiglottic inversion and base of tongue retraction is often incomplete, leading to vallecular residue most notably with solids and the barium tablet. Thin liquids consistently result in trace, transient penetration (PAS 2) which is considered WFL. There was one outlying incident of trace aspiration with thin liquids, which is considered to be mild in nature per the DIGEST. Pt required multiple sips of thin liquids to clear the 13 mm barium tablet from his oral cavity and then bites of puree to clear  it from his valleculae. Even in this period of prolonged bolus manipulation, pt maintained complete laryngeal elevation, demonstrating excellent airway protection. Suspect most significant risk is secondary to large, uncontrolled sips and bites which may be prohibited if pt is given full supervision. Consider giving larger pills whole with puree.  Recommend diet of regular solids with thin liquids. SLP will f/u at least briefly. Given acute stroke, consider adding orders for a cognitive-linguistic evaluation. DIGEST Swallow Severity Rating*  Safety: 1  Efficiency: 1  Overall Pharyngeal Swallow Severity: 1 (mild) 1: mild; 2: moderate; 3: severe; 4: profound *The Dynamic Imaging Grade of Swallowing Toxicity is standardized for the head and neck cancer population, however, demonstrates promising clinical applications across populations to standardize the clinical rating of pharyngeal swallow safety and severity. Factors that may increase risk of adverse event in presence of aspiration Rubye Oaks & Clearance Coots 2021): Factors that may increase risk of adverse event in presence of aspiration Rubye Oaks & Clearance Coots 2021): Limited mobility Recommendations/Plan: Swallowing Evaluation Recommendations Swallowing Evaluation Recommendations Recommendations: PO diet PO Diet Recommendation: Regular; Thin liquids (Level 0) Liquid Administration via: Cup; Straw Medication Administration: Whole meds with puree Supervision: Staff to assist with self-feeding; Full supervision/cueing for swallowing strategies Swallowing strategies  : Minimize environmental distractions; Slow rate; Small bites/sips; Multiple dry swallows after each bite/sip Postural changes: Position pt fully upright for meals Oral care recommendations: Oral care BID (2x/day) Treatment Plan Treatment Plan Treatment recommendations: Therapy as outlined in treatment plan below Follow-up recommendations: Acute inpatient rehab (3 hours/day) Functional status assessment: Patient has had  a recent decline in their functional status and demonstrates the ability to make significant improvements in function in a reasonable and predictable amount of time. Treatment frequency: Min 2x/week Treatment duration: 1 week Interventions: Aspiration precaution training; Patient/family education; Trials of upgraded texture/liquids;  Diet toleration management by SLP Recommendations Recommendations for follow up therapy are one component of a multi-disciplinary discharge planning process, led by the attending physician.  Recommendations may be updated based on patient status, additional functional criteria and insurance authorization. Assessment: Orofacial Exam: Orofacial Exam Oral Cavity: Oral Hygiene: WFL Oral Cavity - Dentition: Adequate natural dentition Orofacial Anatomy: WFL Oral Motor/Sensory Function: WFL Anatomy: Anatomy: Suspected cervical osteophytes Boluses Administered: Boluses Administered Boluses Administered: Thin liquids (Level 0); Mildly thick liquids (Level 2, nectar thick); Moderately thick liquids (Level 3, honey thick); Puree; Solid  Oral Impairment Domain: Oral Impairment Domain Lip Closure: No labial escape Tongue control during bolus hold: Posterior escape of less than half of bolus Bolus preparation/mastication: Timely and efficient chewing and mashing Bolus transport/lingual motion: Brisk tongue motion Oral residue: Complete oral clearance Location of oral residue : N/A Initiation of pharyngeal swallow : Pyriform sinuses  Pharyngeal Impairment Domain: Pharyngeal Impairment Domain Soft palate elevation: No bolus between soft palate (SP)/pharyngeal wall (PW) Laryngeal elevation: Complete superior movement of thyroid cartilage with complete approximation of arytenoids to epiglottic petiole Anterior hyoid excursion: Complete anterior movement Epiglottic movement: Complete inversion Laryngeal vestibule closure: Complete, no air/contrast in laryngeal vestibule Pharyngeal stripping wave : Present  - complete Pharyngeal contraction (A/P view only): N/A Pharyngoesophageal segment opening: Complete distension and complete duration, no obstruction of flow Tongue base retraction: Trace column of contrast or air between tongue base and PPW Pharyngeal residue: Collection of residue within or on pharyngeal structures Location of pharyngeal residue: Tongue base; Valleculae  Esophageal Impairment Domain: Esophageal Impairment Domain Esophageal clearance upright position: Complete clearance, esophageal coating Pill: Pill Consistency administered: Thin liquids (Level 0) Thin liquids (Level 0): Impaired (see clinical impressions) Penetration/Aspiration Scale Score: No data recorded Compensatory Strategies: Compensatory Strategies Compensatory strategies: Yes Straw: Effective Effective Straw: Thin liquid (Level 0); Mildly thick liquid (Level 2, nectar thick) Multiple swallows: Effective Effective Multiple Swallows: Puree; Solid; Pill   General Information: Caregiver present: Yes  Diet Prior to this Study: NPO   Temperature : Normal   Respiratory Status: WFL   Supplemental O2: Nasal cannula   History of Recent Intubation: Yes  Behavior/Cognition: Alert; Cooperative; Requires cueing Self-Feeding Abilities: Needs assist with self-feeding Baseline vocal quality/speech: Normal Volitional Cough: Able to elicit Volitional Swallow: Able to elicit Exam Limitations: No limitations Goal Planning: Prognosis for improved oropharyngeal function: Good Barriers to Reach Goals: Cognitive deficits; Time post onset No data recorded Patient/Family Stated Goal: no family present, patient did not state Consulted and agree with results and recommendations: Patient; Nurse Pain: Pain Assessment Pain Assessment: Faces Pain Score: 0 Faces Pain Scale: 0 Pain Location: generalized with movement Pain Descriptors / Indicators: Grimacing; Discomfort Pain Intervention(s): Monitored during session End of Session: Start Time:SLP Start Time (ACUTE ONLY):  1050 Stop Time: SLP Stop Time (ACUTE ONLY): 1111 Time Calculation:SLP Time Calculation (min) (ACUTE ONLY): 21 min Charges: SLP Evaluations $ SLP Speech Visit: 1 Visit SLP Evaluations $BSS Swallow: 1 Procedure $MBS Swallow: 1 Procedure $Swallowing Treatment: 1 Procedure SLP visit diagnosis: SLP Visit Diagnosis: Dysphagia, oropharyngeal phase (R13.12) Past Medical History: Past Medical History: Diagnosis Date  Arthritis   Cancer (HCC)   prostate cancer   Cardiomyopathy (HCC)   Chronic kidney disease, stage 3 (HCC)   Coronary artery disease   ED (erectile dysfunction)   GERD (gastroesophageal reflux disease)   Headache   hx of migraine-2015   Hyperlipidemia   Hypertension   Hypothyroidism   LBBB (left bundle branch block)   Migraine  Nocturia   Overactive bladder   Pneumonia   hx of at age 54  Past Surgical History: Past Surgical History: Procedure Laterality Date  CARPAL TUNNEL RELEASE Bilateral   CORONARY ARTERY BYPASS GRAFT N/A 01/04/2024  Procedure: CORONARY ARTERY BYPASS GRAFTING TIMES THREE USING LEFT INTERNAL MAMMARY ARTERY AND RIGHT GREAT SAPHENOUS VEIN HARVESTED ENDOSCOPICALLY;  Surgeon: Corliss Skains, MD;  Location: [facility] OR;  Service: Open Heart Surgery;  Laterality: N/A;  INTRAOPERATIVE TRANSESOPHAGEAL ECHOCARDIOGRAM N/A 01/04/2024  Procedure: ECHOCARDIOGRAM, TRANSESOPHAGEAL, INTRAOPERATIVE;  Surgeon: Corliss Skains, MD;  Location: [facility] OR;  Service: Open Heart Surgery;  Laterality: N/A;  PROSTATE SURGERY    RIGHT/LEFT HEART CATH AND CORONARY ANGIOGRAPHY Bilateral 12/15/2023  Procedure: RIGHT/LEFT HEART CATH AND CORONARY ANGIOGRAPHY;  Surgeon: Yvonne Kendall, MD;  Location: [facility] INVASIVE CV LAB;  Service: Cardiovascular;  Laterality: Bilateral;  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  Procedure: LEFT TOTAL HIP ARTHROPLASTY ANTERIOR APPROACH;  Surgeon: Durene Romans, MD;  Location: [facility] ORS;  Service: Orthopedics;  Laterality: Left; Gwynneth Aliment, M.A., CF-SLP Speech Language Pathology, Acute  Rehabilitation Services Secure Chat preferred [phone] 01/07/2024, 11:38 AM  DG CHEST PORT 1 VIEW Result Date: 01/07/2024 CLINICAL DATA:  Hypoxemia EXAM: PORTABLE CHEST 1 VIEW COMPARISON:  01/06/2024 FINDINGS: Cardiac shadow is enlarged. Postsurgical changes are again seen. Right jugular central line is noted. Lungs are well aerated bilaterally. No pneumothorax is seen. Mild bibasilar atelectasis is noted. IMPRESSION: Mild bibasilar atelectasis. No other focal abnormality is noted. Electronically Signed   By: Alcide Clever M.D.   On: 01/07/2024 02:24   MR ANGIO HEAD WO CONTRAST Result Date: 01/06/2024 CLINICAL DATA:  Follow-up examination for stroke. EXAM: MRA HEAD WITHOUT CONTRAST TECHNIQUE: Angiographic images of the Circle of Willis were acquired using MRA technique without intravenous contrast. COMPARISON:  MRI from earlier the same day. FINDINGS: Examination degraded by motion artifact. Anterior circulation: Left ICA widely patent to the siphon without stenosis. Focal moderate to severe stenosis noted at the supraclinoid right ICA just prior to the terminus (series 2, image 94). Superimposed tiny 2 mm outpouching at this level could reflect a small aneurysm (series 2, image 95). A1 segments patent bilaterally. Normal anterior communicating artery complex. Anterior cerebral arteries patent without significant stenosis. No M1 stenosis or occlusion. Distal MCA branches perfused and symmetric. Posterior circulation: Both V4 segments patent without stenosis. Right vertebral artery slightly dominant. Both PICA patent at their origins. Basilar patent without stenosis. Superior cerebral arteries patent bilaterally. Both PCAs primarily supplied via the basilar. Focal severe stenosis at the left P1/P2 junction (series 2, image 104). Focal moderate to severe right P2 stenosis (series 251, image 15). PCAs otherwise patent to their distal aspects. Anatomic variants: None significant. Other: None. IMPRESSION: 1.  Negative intracranial MRA for large vessel occlusion. 2. Focal moderate to severe stenosis at the supraclinoid right ICA just prior to the terminus. Superimposed tiny 2 mm outpouching at this level could reflect a small aneurysm. 3. Focal severe left P1/P2 junction stenosis, with additional moderate to severe right P2 stenosis. Electronically Signed   By: Rise Mu M.D.   On: 01/06/2024 23:42   MR BRAIN WO CONTRAST Result Date: 01/06/2024 CLINICAL DATA:  Neuro deficit, acute, stroke suspected. CABG 2 days ago. Right leg weakness. Abnormal CT of the head. EXAM: MRI HEAD WITHOUT CONTRAST TECHNIQUE: Multiplanar, multiecho pulse sequences of the brain and surrounding structures were obtained without intravenous contrast. COMPARISON:  CT head without contrast 01/05/2024. FINDINGS: Brain: Diffusion-weighted images confirm the acute/subacute infarcts in the left  ACA distribution. In addition posteromedial parietal lobe infarcts are present bilaterally, left greater than right. Diffuse punctate foci of acute nonhemorrhagic infarction are present across the ACA/MCA watershed territories bilaterally. Bilateral cerebellar infarcts are more prominent on the left. The largest measures 10 mm. T2 and FLAIR hyperintensities are associated with the areas of restricted diffusion, consistent with the subacute time frame. No associated hemorrhage is present. Mild periventricular T2 hyperintensities are present otherwise. Deep brain nuclei are within normal limits. The ventricles are of normal size. The brainstem and cerebellum are otherwise within normal limits. No significant extraaxial fluid collection is present. Vascular: Flow is present in the major intracranial arteries. Skull and upper cervical spine: The craniocervical junction is normal. Upper cervical spine is within normal limits. Marrow signal is unremarkable. Sinuses/Orbits: The paranasal sinuses and mastoid air cells are clear. The globes and orbits are  within normal limits. Other: IMPRESSION: 1. Multiple foci of acute/subacute nonhemorrhagic infarction is present throughout both cerebral hemispheres and left greater than right cerebellum. 2. Acute/subacute infarcts in the left ACA distribution. 3. Additional posteromedial parietal lobe infarcts bilaterally, left greater than right. 4. Diffuse punctate foci of acute nonhemorrhagic infarction across the ACA/MCA watershed territories bilaterally. 5. Bilateral cerebellar infarcts are more prominent on the left. 6. The constellation of findings suggest a central embolic source. 7. Mild periventricular T2 hyperintensities bilaterally likely reflect the sequela of chronic microvascular ischemia. Electronically Signed   By: Marin Roberts M.D.   On: 01/06/2024 19:36   DG Chest Port 1 View Result Date: 01/06/2024 CLINICAL DATA:  Pneumothorax, CABG. EXAM: PORTABLE CHEST 1 VIEW COMPARISON:  01/05/2024 and CT chest 10/13/2023. FINDINGS: Right IJ catheter sheath terminates in the low right internal jugular vein. Right chest tube and mediastinal drain or pericardial drain remain in place. A defibrillator pad overlies the lower right chest. Trachea is midline. Cardiomediastinal silhouette is enlarged, as before. Lungs are low in volume with perihilar and bibasilar atelectasis. Small bilateral pleural effusions. There may be a trace right apical pneumothorax. IMPRESSION: 1. Possible trace right apical pneumothorax with right chest tube in place. 2. Low lung volumes with bibasilar atelectasis and small bilateral pleural effusions. Electronically Signed   By: Leanna Battles M.D.   On: 01/06/2024 09:34   CT HEAD WO CONTRAST ( ) Result Date: 01/05/2024 CLINICAL DATA:  Neuro deficit, acute, stroke suspected. EXAM: CT HEAD WITHOUT CONTRAST TECHNIQUE: Contiguous axial images were obtained from the base of the skull through the vertex without intravenous contrast. RADIATION DOSE REDUCTION: This exam was performed according  to the departmental dose-optimization program which includes automated exposure control, adjustment of the mA and/or kV according to patient size and/or use of iterative reconstruction technique. COMPARISON:  CT head without contrast 09/16/2014 FINDINGS: Brain: Age indeterminate likely acute/subacute infarct is present in the inferior left cerebellum. Multiple foci of cortical infarction are present in the medial left frontal lobe, ACA distribution, well seen on sagittal image 33 of series 5. No acute hemorrhage or mass lesion is present. Deep brain nuclei are within normal limits. The ventricles are of normal size. No significant extraaxial fluid collection is present. The brainstem and cerebellum are within normal limits. Midline structures are within normal limits. Vascular: Atherosclerotic calcifications are present within the cavernous internal carotid arteries bilaterally. No hyperdense vessel is present. Skull: Calvarium is intact. No focal lytic or blastic lesions are present. No significant extracranial soft tissue lesion is present. Sinuses/Orbits: The paranasal sinuses and mastoid air cells are clear. The globes and orbits are within  normal limits. IMPRESSION: 1. Age indeterminate likely acute/subacute infarct in the inferior left cerebellum. 2. Multiple foci of cortical infarction in the medial left frontal lobe, ACA distribution, also likely acute. 3. No acute hemorrhage. These results will be called to the ordering clinician or representative by the Radiologist Assistant, and communication documented in the PACS or [employer]. Electronically Signed   By: Marin Roberts M.D.   On: 01/05/2024 17:34   ECHO INTRAOPERATIVE TEE Result Date: 01/05/2024  *INTRAOPERATIVE TRANSESOPHAGEAL REPORT *  Patient Name:   Tyler David Date of Exam: 01/04/2024 Medical Rec #:  [id number]         Height:       68.0 in Accession #:    [id number]        Weight:       188.0 lb Date of Birth:  1946/07/14           BSA:          1.99 m Patient Age:    77 years          BP:           156/65 mmHg Patient Gender: M                 HR:           80 bpm. Exam Location:  Anesthesiology Transesophogeal exam was perform intraoperatively during surgical procedure. Patient was closely monitored under general anesthesia during the entirety of examination. Indications:     CAD Sonographer:     Irving Burton Senior RDCS Performing Phys: Eliezer Lofts LIGHTFOOT Diagnosing Phys: Anice Paganini PROCEDURE: Intraoperative Transesophogeal 3D imaging performed to better assess valves. Complications: No known complications during this procedure. POST-OP IMPRESSIONS _ Left Ventricle: The left ventriclar function is mildly reduced on inotropic support. EF is approximately 40%. Hypokinesis of inferior and inferoseptal walls unchanged from prebypass evaluation. _ Right Ventricle: The right ventriclar function is normal on inotropic support. _ Aorta: The aorta appears unchanged from pre-bypass. There is no dissection. _ Aortic Valve: The aortic valve appears unchanged from pre-bypass. Mild AI. _ Mitral Valve: The mitral valve appears unchanged from pre-bypass. Mild MR. _ Tricuspid Valve: The tricuspid valve appears unchanged from pre-bypass. Trace TR. _ Pulmonic Valve: The pulmonic valve appears unchanged from pre-bypass. Mild PI PRE-OP FINDINGS  Left Ventricle: The left ventricle has moderately reduced systolic function, with an ejection fraction of 34.6% by Simpson's biplane method of discs. The cavity size was mildly dilated. Hypokinesis in the inferior and inferoseptal walls. Right Ventricle: The right ventricle has normal systolic function. The cavity was normal. Left Atrium: No left atrial/left atrial appendage thrombus was detected. Interatrial Septum: No atrial level shunt detected by color flow Doppler. There is no evidence of a patent foramen ovale. Pericardium: There is no evidence of pericardial effusion. There is no pleural effusion. Mitral Valve: The  mitral valve is normal in structure. The coaptation point is visualized 1.65cm below the mitral annulus, resulting in Carpentier type IIIb mitral regurgitation. The regurgitation is mild by color flow Doppler. VC 0.115cm. Tricuspid Valve: The tricuspid valve was normal in structure. Tricuspid valve regurgitation is trivial by color flow Doppler. Aortic Valve: The aortic valve is tricuspid. The valve opens well with no restrictions. No aortic stenosis. There is mild aortic insufficiency. Pressure half time , VC 0.168. No flow reversal in the descending thoracic aorta. Pulmonic Valve: The pulmonic valve was normal in structure. Pulmonic valve regurgitation is mild by color flow Doppler. Aorta: The  aortic root is normal in size and structure. There is grade IV atheromatous disease in the distal aortic arch, and proximal descending thoracic aorta. +--------------+--------++ LEFT VENTRICLE               +----------------+----------++ +--------------+--------++       Diastology                 PLAX 2D                      +----------------+----------++ +--------------+--------++       LV e' lateral:  8.64 cm/s  LVOT diam:    2.00 cm        +----------------+----------++ +--------------+--------++       LV E/e' lateral:8.3        LVOT Area:    3.14 cm       +----------------+----------++ +--------------+--------++       LV e' medial:   72.70 cm/s                              +----------------+----------++ +--------------+--------++       LV E/e' medial: 1.0                                         +----------------+----------++ +------------------+---------++ LV Volumes (MOD)            +------------------+---------++ LV area d, A2C:   45.90 cm +------------------+---------++ LV area d, A4C:   42.10 cm +------------------+---------++ LV area s, A2C:   36.60 cm +------------------+---------++ LV area s, A4C:   30.80 cm +------------------+---------++ LV  major d, A2C:  10.40 cm  +------------------+---------++ LV major d, A4C:  9.04 cm   +------------------+---------++ LV major s, A2C:  9.45 cm   +------------------+---------++ LV major s, A4C:  8.05 cm   +------------------+---------++ LV vol d, MOD A2C:172.0 ml  +------------------+---------++ LV vol d, MOD A4C:162.0 ml  +------------------+---------++ LV vol s, MOD A2C:122.0 ml  +------------------+---------++ LV vol s, MOD A4C:96.8 ml   +------------------+---------++ LV SV MOD A2C:    50.0 ml   +------------------+---------++ LV SV MOD A4C:    162.0 ml  +------------------+---------++ LV SV MOD BP:     61.9 ml   +------------------+---------++ +---------------+------+-------+ RIGHT VENTRICLE              +---------------+------+-------+ TAPSE (M-mode):2.7 cm2.37 cm +---------------+------+-------+ +------------------+------------++ AORTIC VALVE                   +------------------+------------++ AV Area (Vmax):   2.25 cm     +------------------+------------++ AV Area (Vmean):  2.15 cm     +------------------+------------++ AV Area (VTI):    2.14 cm     +------------------+------------++ AV Vmax:          140.00 cm/s  +------------------+------------++ AV Vmean:         100.800 cm/s +------------------+------------++ AV VTI:           0.336 m      +------------------+------------++ AV Peak Grad:     7.8 mmHg     +------------------+------------++ AV Mean Grad:     5.0 mmHg     +------------------+------------++ LVOT Vmax:        100.35 cm/s  +------------------+------------++ LVOT Vmean:       69.000 cm/s  +------------------+------------++ LVOT VTI:         0.229 m      +------------------+------------++  LVOT/AV VTI ratio:0.68         +------------------+------------++ AR PHT:           514 msec     +------------------+------------++  +--------------+-------++ AORTA                  +--------------+-------++ Ao Sinus diam:2.80 cm +--------------+-------++ Ao STJ diam:  3.0 cm  +--------------+-------++ +--------------+----------++ MITRAL VALVE              +--------------+-------+ +--------------+----------++  SHUNTS                MV Area (PHT):4.06 cm    +--------------+-------+ +--------------+----------++  Systemic VTI: 0.23 m  MV PHT:       54.23 msec  +--------------+-------+ +--------------+----------++  Systemic Diam:2.00 cm MV Decel Time:187 msec    +--------------+-------+ +--------------+----------++ +--------------+----------++ MV E velocity:71.40 cm/s +--------------+----------++ MV A velocity:90.60 cm/s +--------------+----------++ MV E/A ratio: 0.79       +--------------+----------++  Anice Paganini Electronically signed by Anice Paganini Signature Date/Time: 01/05/2024/12:45:36 PM    Final    DG Chest Port 1 View Result Date: 01/05/2024 CLINICAL DATA:  [id number]. Status post CABG x3. EXAM: PORTABLE CHEST 1 VIEW COMPARISON:  Portable chest yesterday at 12:58 p.m. FINDINGS: 5:20 a.m. Sternotomy and CABG changes again noted. Stable mediastinum with widening superiorly. There is a chest tube in the right base and at least 1 mediastinal drain, with no measurable pneumothorax. Interval extubation. Right IJ catheter introducer sheath terminates likely in the brachiocephalic/IJ junction. There are small pleural effusions, low lung volumes, stable basilar atelectasis. Stable cardiomegaly. No evidence for CHF. Remaining lungs are clear. Compare: The overall aeration seems unchanged. IMPRESSION: 1. Interval extubation. 2. No measurable pneumothorax. 3. Stable cardiomegaly and small pleural effusions. 4. Stable basilar atelectasis. Electronically Signed   By: Almira Bar M.D.   On: 01/05/2024 07:51   DG Chest Port 1 View Result Date: 01/04/2024 CLINICAL DATA:  Status post CABG EXAM: PORTABLE CHEST 1 VIEW COMPARISON:  Preop x-ray 01/02/2024 FINDINGS:  Sternal wires now seen. Mediastinal drains. ET tube with tip approximately 4 cm above the carina. Right IJ line with tip overlying the upper right hemithorax, likely the brachiocephalic to IJ junction. Underinflation with enlarged cardiopericardial silhouette widening of the mediastinum which could be postoperative. Prominent central vasculature. Mild bilateral lung opacities. No left-sided pneumothorax. Right chest tube. Tiny right apical pneumothorax. No edema IMPRESSION: Postop chest numerous tubes and lines. Somewhat shallow appearing right IJ line. Tiny right pneumothorax but there is a right chest tube. Underinflation. Enlarged cardiopericardial silhouette widened mediastinum. Prominent central vasculature. Bilateral lung base opacities. Recommend follow-up Electronically Signed   By: Karen Kays M.D.   On: 01/04/2024 14:45   DG Chest 2 View Result Date: 01/02/2024 CLINICAL DATA:  Preop chest exam.  Coronary artery disease. EXAM: CHEST - 2 VIEW COMPARISON:  Radiograph 09/25/2023.  CT 09/23/2023 FINDINGS: Stable heart size and mediastinal contours. No acute airspace disease, large pleural effusion or pneumothorax. Diminished fluid in the fissures. No acute osseous findings. IMPRESSION: No acute findings. Electronically Signed   By: Narda Rutherford M.D.   On: 01/02/2024 17:29   VAS US DOPPLER PRE CABG Result Date: 01/02/2024 PREOPERATIVE VASCULAR EVALUATION Patient Name:  [name]  Date of Exam:   01/02/2024 Medical Rec #: [id number]          Accession #:    [id number] Date of Birth: 1945-12-05           Patient Gender: M Patient Age:  77 years Exam Location:  [facility] Procedure:      VAS US DOPPLER PRE CABG Referring Phys: HARRELL LIGHTFOOT --------------------------------------------------------------------------------  Indications:      Pre-CABG. Risk Factors:     Hypertension, hyperlipidemia, no history of smoking, coronary                   artery disease. Other Factors:    CHF,  CKD. Comparison Study: No previous exams Performing Technologist: Hill, Jody RVT, RDMS  Examination Guidelines: A complete evaluation includes B-mode imaging, spectral Doppler, color Doppler, and power Doppler as needed of all accessible portions of each vessel. Bilateral testing is considered an integral part of a complete examination. Limited examinations for reoccurring indications may be performed as noted.  Right Carotid Findings: +----------+--------+--------+--------+------------+------------------+           PSV cm/sEDV cm/sStenosisDescribe    Comments           +----------+--------+--------+--------+------------+------------------+ CCA Prox  55      9                           intimal thickening +----------+--------+--------+--------+------------+------------------+ CCA Distal51      12              heterogenous                   +----------+--------+--------+--------+------------+------------------+ ICA Prox  72      23      1-39%   heterogenous                   +----------+--------+--------+--------+------------+------------------+ ICA Distal52      13                                             +----------+--------+--------+--------+------------+------------------+ ECA       86      7                                              +----------+--------+--------+--------+------------+------------------+ +----------+--------+-------+----------------+------------+           PSV cm/sEDV cmsDescribe        Arm Pressure +----------+--------+-------+----------------+------------+ [id number]             Multiphasic, WNL             +----------+--------+-------+----------------+------------+ +---------+--------+--+--------+--+---------+ VertebralPSV cm/s37EDV cm/s10Antegrade +---------+--------+--+--------+--+---------+ Left Carotid Findings: +----------+--------+--------+--------+--------+--------+           PSV cm/sEDV  cm/sStenosisDescribeComments +----------+--------+--------+--------+--------+--------+ CCA Prox  62      10                               +----------+--------+--------+--------+--------+--------+ CCA Distal56      13                               +----------+--------+--------+--------+--------+--------+ ICA Prox  70      22                               +----------+--------+--------+--------+--------+--------+ ICA Distal79      22                               +----------+--------+--------+--------+--------+--------+  ECA       85      0                                +----------+--------+--------+--------+--------+--------+  +----------+--------+--------+----------------+------------+ SubclavianPSV cm/sEDV cm/sDescribe        Arm Pressure +----------+--------+--------+----------------+------------+           102             Multiphasic, WNL             +----------+--------+--------+----------------+------------+ +---------+--------+--+--------+-+---------+ VertebralPSV cm/s29EDV cm/s8Antegrade +---------+--------+--+--------+-+---------+  ABI Findings: +------------------+-----+---------+ Rt Pressure (mmHg)IndexWaveform  +------------------+-----+---------+ 157                    triphasic +------------------+-----+---------+ 188               1.19 biphasic  +------------------+-----+---------+ 178               1.13 triphasic +------------------+-----+---------+ 103               0.65 Normal    +------------------+-----+---------+ +------------------+-----+----------+ Lt Pressure (mmHg)IndexWaveform   +------------------+-----+----------+ 158                    triphasic  +------------------+-----+----------+ 131               0.83 biphasic   +------------------+-----+----------+ 125               0.79 monophasic +------------------+-----+----------+ 57                0.36 Abnormal    +------------------+-----+----------+  Right Doppler Findings: +--------+--------+---------+ Site    PressureDoppler   +--------+--------+---------+ [id number]     triphasic +--------+--------+---------+ Radial          triphasic +--------+--------+---------+ Ulnar           triphasic +--------+--------+---------+  Left Doppler Findings: +--------+--------+---------+ Site    PressureDoppler   +--------+--------+---------+ [id number]     triphasic +--------+--------+---------+ Radial          triphasic +--------+--------+---------+ Ulnar           triphasic +--------+--------+---------+   Summary: Right Carotid: Velocities in the right ICA are consistent with a 1-39% stenosis. Left Carotid: There is no evidence of stenosis in the left ICA. Vertebrals:  Bilateral vertebral arteries demonstrate antegrade flow. Subclavians: Normal flow hemodynamics were seen in bilateral subclavian              arteries. Right ABI: Resting right ankle-brachial index is within normal range. The right toe-brachial index is mildly decreased. Left ABI: Resting left ankle-brachial index indicates mild left lower extremity arterial disease. The left toe-brachial index is abnormal. Bilateral Extremity: Doppler waveforms remain within normal limits with compression bilaterally for the radial arteries. Doppler waveforms remain within normal limits with compression bilaterally for the ulnar arteries.  Electronically signed by Lemar Livings MD on 01/02/2024 at 3:51:02 PM.    Final    CARDIAC CATHETERIZATION Result Date: 12/15/2023 Conclusions: Severe three-vessel coronary artery disease, as detailed below. Normal left and right heart filling pressures. Normal Fick cardiac output/index. Recommendations: Outpatient cardiac surgery consultation for CABG. Change furosemide to as needed for weight gain/edema. Continue escalation of GDMT as tolerated. Consider adding PCSK9 inhibitor at follow-up, given intolerance to  multiple statins. Yvonne Kendall, MD [facility]   Labs:  Basic Metabolic Panel: Recent Labs  Lab 01/07/24 [phone] 01/08/24 0224 01/08/24 1610 01/09/24 0456 01/10/24 0253 01/11/24  0409 01/13/24 1050  NA 141 140 137 135 139 141 140  K 3.8 3.6 4.0 3.6 4.0 3.9 4.4  CL 111 109 107 104 109 109 111  CO2 21* 25 22 21* 21* 24 20*  GLUCOSE 88 115* 114* 262* 91 105* 95  BUN 38* 45* 42* 40* 41* 36* 40*  CREATININE 1.98* 2.23* 2.04* 2.14* 2.18* 2.20* 2.08*  CALCIUM 7.9* 7.4* 7.5* 7.1* 7.7* 7.9* 7.9*  MG  --   --  3.2*  --   --   --   --     CBC: Recent Labs  Lab 01/11/24 0409 01/12/24 0320 01/13/24 1050  WBC 8.1 10.5 12.2*  NEUTROABS  --   --  9.1*  HGB 9.9* 9.8* 9.5*  HCT 30.1* 30.5* 29.5*  MCV 89.6 90.5 91.0  PLT 283 324 345    CBG: Recent Labs  Lab 01/11/24 0322 01/11/24 0812 01/11/24 1201 01/11/24 1650 01/11/24 2113  GLUCAP 101* 94 102* 129* 128*   Family history.  Mother with ovarian cancer father with heart failure and COPD.  Denies any colon cancer esophageal cancer or rectal cancer  Brief HPI:   Tyler David is a 78 y.o. 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  Per chart review lives with spouse and family.  1 level home with a level entry.  Independent prior to admission.  Presented 01/04/2024 with chest pain and shortness of breath.  Workup suggestive of pulmonary edema.  Echocardiogram revealed ejection fraction of 35 to 40%.  Underwent cardiac catheterization demonstrating multivessel obstructive coronary artery disease.  Underwent CABG x 3 01/04/2024 per Dr. Cliffton Asters with endoscopic greater saphenous vein harvest on the right.  Remained on dobutamine intubated was transferred to the ICU.  Hospital course complicated by atrial fibrillation with rapid ventricular response postoperative day 1  which was treated with amiodarone.  On 01/06/2024 patient developed right lower extremity weakness.  MRI of the brain revealed acute/subacute nonhemorrhagic infarction throughout both cerebral hemispheres left greater than right cerebellum.  Acute/subacute infarct in the left ACA distribution were seen.  Additionally there were posterior medial parietal lobe infarctions bilaterally left greater than right in addition to acute nonhemorrhagic infarct in the ACA/MCA watershed territory bilaterally.  Patient was seen by neurology services.  Recommended transition from intravenous heparin to Eliquis when appropriate as well as low-dose aspirin.  Cardiac rate remained controlled and he continued on amiodarone as directed.  Tolerating a regular consistency diet.  Therapy evaluations completed and due to patient decreased functional mobility was admitted for a comprehensive rehab program.   Hospital Course: LASTER APPLING was admitted to rehab 01/12/2024 for inpatient therapies to consist of PT, ST and OT at least three hours five days a week. Past admission physiatrist, therapy team and rehab RN have worked together to provide customized collaborative inpatient rehab.  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  Patient remained stable followed by neurology services.  He continued on sternal precautions after CABG per cardiothoracic surgery.  Maintained on Eliquis as well as aspirin.  Venous Dopplers lower extremities negative.  Cardiac rate controlled for noted postoperative atrial fibrillation with amiodarone as directed and taper accordingly.  He remained also on low-dose beta-blocker.  Acute blood loss anemia stable no bleeding episodes.  Blood pressure controlled on Norvasc.  Zetia ongoing for hyperlipidemia.  History of hypothyroidism maintained  on hormone supplement.  CKD stage III with creatinine baseline 1.8-2.51 with monitoring of  chemistries.  Protonix ongoing for GERD.   Blood pressures were monitored on TID basis and remained controlled monitored     Rehab course: During patient's stay in rehab weekly team conferences were held to monitor patient's progress, set goals and discuss barriers to discharge. At admission, patient required max assist sit to supine total assist supine to sit  Physical exam.  Blood pressure 149/66 pulse 67 temperature 98.4 respiration 17 oxygen saturation is 93% room air Constitutional.  No acute distress HEENT Head.  Normocephalic and atraumatic Eyes.  Pupils round and reactive to light no discharge without nystagmus Neck.  Supple nontender no JVD without thyromegaly Cardiac regular rate and rhythm without any extra sounds or murmur heard Abdomen.  Soft nontender positive bowel sounds without rebound Respiratory effort normal no respiratory distress without wheeze Skin.  Chest incision healing nicely with minimal scar Neurologic.  Patient alert makes eye contact with examiner.  Provides name and age but does delay some processing as well as problem solving.  Right upper extremity 4+/5, left upper extremity 3-3+/5 proximal to 4 - at hand and wrist.  Right lower extremity 2/5 hip flexors, knee extension and trace to absent at ankle.  Left lower extremity 3-4/5 proximal to distal.  Sensation intact.      He/She  has had improvement in activity tolerance, balance, postural control as well as ability to compensate for deficits. He/She has had improvement in functional use RUE/LUE  and RLE/LLE as well as improvement in awareness.  Ambulates 175 feet x 2 rolling walker moderate cues for right lower extremity foot clearance and proximity to rolling walker with contact-guard.  Completes sit to stand from recliner with moderate assist.  Completes gait training navigating x 3 cones for trials contact-guard/minimal assist.  He did need some cues to maintain sternal precautions.  He did need some  assistance for lower body ADLs.  SLP facilitated sessions by providing supervision assist during hospital navigation tasks.  He was able to identify objects while in the gift shop with modified independence.  Full family teaching completed plan discharge to home       Disposition:  There are no questions and answers to display.         Diet: Regular  Special Instructions: No driving smoking or alcohol  Sternal precautions  Medications at discharge. 1.  Amiodarone 200 mg daily 2.  Norvasc 10 mg p.o. daily 3.  Eliquis 5 mg p.o. twice daily 4.  Aspirin 81 mg daily 5.  Colace 200 mg p.o. daily 6.  Zetia 10 mg p.o. daily 7.  Pepcid 20 mg p.o. nightly 8.  Synthroid 50 mcg p.o. daily 9.  Lopressor 12.5 mg p.o. twice daily 10.  Oxycodone 5 to 10 mg every 4 hours as needed pain 11.  Protonix 40 mg p.o. daily 12.  Lasix 40 mg daily 13.  Vitamin D 1000 units daily 14.  Fish oil 1000 mg daily 15.  ICAPS 1 capsule twice daily 16.  Magnesium 420 mg daily 17.  Nitroglycerin as needed 18.  Turmeric 500 mg twice daily 19.  Dulcolax tablet 10 mg daily 20.  Voltaren gel 2 g 4 times daily to affected area 21.  Colace 200 mg daily 22.  Claritin 10 mg daily 23.  Melatonin 5 mg nightly  30-35 minutes were spent completing discharge summary and discharge planning  Discharge Instructions     Ambulatory referral to  Neurology   Complete by: As directed    An appointment is requested in approximately: 4 weeks cardioembolic infarction        Follow-up Information     Angelina Sheriff, DO Follow up.   Specialty: Physical Medicine and Rehabilitation Why: Office to call for appointment Contact information: [address] [place] [address] [phone]         Corliss Skains, MD Follow up.   Specialty: Cardiothoracic Surgery Why: Call for appointment Contact information: [address] [place] [address] [phone]                  Signed: Charlton Amor 01/13/2024, 6:46 PM

## 2024-01-13 NOTE — Progress Notes (Signed)
 Restless night. Awake all night. Offered melatonin at bedtime, but patient declined. Needs assistance with using urinal. Sternal incision, chest tube site incision and leg incision are healing without signs or symptoms of infection. RLE with 1 + pitting edema. Assisted OOB to wheelchair around 0500. At 0530 requested to sit in recliner. Tyler David A

## 2024-01-13 NOTE — Evaluation (Signed)
 Speech Language Pathology Assessment and Plan  Patient Details  Name: Tyler David MRN: [id number] Date of Birth: [birth year]  SLP Diagnosis: Dysphagia;Cognitive Impairments  Rehab Potential: Excellent ELOS: 18-21 days    Today's Date: 01/13/2024 SLP Individual Time: 0800-0900 SLP Individual Time Calculation (min): 60 min   Hospital Problem: Principal Problem:   Cardioembolic stroke [facility])  Past Medical History:  Past Medical History:  Diagnosis Date   Arthritis    Cancer (HCC)    prostate cancer    Cardiomyopathy (HCC)    Chronic kidney disease, stage 3 (HCC)    Coronary artery disease    ED (erectile dysfunction)    GERD (gastroesophageal reflux disease)    Headache    hx of migraine-2015    Hyperlipidemia    Hypertension    Hypothyroidism    LBBB (left bundle branch block)    Migraine    Nocturia    Overactive bladder    Pneumonia    hx of at age 16    Past Surgical History:  Past Surgical History:  Procedure Laterality Date   CARPAL TUNNEL RELEASE Bilateral    CORONARY ARTERY BYPASS GRAFT N/A 01/04/2024   Procedure: CORONARY ARTERY BYPASS GRAFTING TIMES THREE USING LEFT INTERNAL MAMMARY ARTERY AND RIGHT GREAT SAPHENOUS VEIN HARVESTED ENDOSCOPICALLY;  Surgeon: Corliss Skains, MD;  Location: [facility] OR;  Service: Open Heart Surgery;  Laterality: N/A;   INTRAOPERATIVE TRANSESOPHAGEAL ECHOCARDIOGRAM N/A 01/04/2024   Procedure: ECHOCARDIOGRAM, TRANSESOPHAGEAL, INTRAOPERATIVE;  Surgeon: Corliss Skains, MD;  Location: [facility] OR;  Service: Open Heart Surgery;  Laterality: N/A;   PROSTATE SURGERY     RIGHT/LEFT HEART CATH AND CORONARY ANGIOGRAPHY Bilateral 12/15/2023   Procedure: RIGHT/LEFT HEART CATH AND CORONARY ANGIOGRAPHY;  Surgeon: Yvonne Kendall, MD;  Location: [facility] INVASIVE CV LAB;  Service: Cardiovascular;  Laterality: Bilateral;   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   Procedure: LEFT TOTAL HIP ARTHROPLASTY ANTERIOR APPROACH;  Surgeon: Durene Romans, MD;   Location: [facility] ORS;  Service: Orthopedics;  Laterality: Left;    Assessment / Plan / Recommendation Clinical Impression HPI: [name] is a 78 year old right-handed male with history significant for hypertension, migraine headaches, hyperlipidemia, CKD stage III, prostate cancer, left total hip arthroplasty 09/29/2015, coronary artery calcification/cardiomyopathy/left bundle branch block. Per chart review patient lives with spouse and family. 1 level home with level entry. Independent prior to admission. Presented 01/04/2024 with chest pain and shortness of breath. Workup suggestive of pulmonary edema. Echocardiogram revealed ejection fraction of 35 to 40%. Underwent cardiac catheterization demonstrating multivessel obstructive coronary artery disease. Underwent CABG x 3 01/04/2024 per Dr. Cliffton Asters with endoscopic greater saphenous vein harvest on the right. Remained on dobutamine intubated was transferred to the ICU. Hospital course complicated by atrial fibrillation with rapid ventricular response postop day 1 which was treated with amiodarone. On 01/06/2024 patient developed right lower extremity weakness. MRI of the brain revealed acute/subacute nonhemorrhagic infarction throughout both cerebral hemispheres and left greater than right cerebellum. Acute/subacute infarct in the left ACA distribution were seen. Additionally there were posterior medial parietal lobe infarcts bilaterally left greater than right in addition to acute nonhemorrhagic infarcts in the ACA/MCA watershed territory bilaterally. Patient was seen by neurology services. Recommendations were to transition from IV heparin to Eliquis when appropriate as well as low-dose aspirin. Cardiac rate remains controlled and he continues on amiodarone as directed. Tolerating a regular consistency diet.   Clinical Impression:  Bedside Swallow Evaluation: A bedside swallow evaluation was completed to assess for s/sx of oropharyngeal  dysphagia. Oral  mechanism exam WFL. POs administered included thin liquids, purees and solids. Patient with timely mastication, and adequate oral clearance. Patient with immediate throat clear x1 after consecutive sips of thin liquids and occasional delayed throat clears after all consistencies. Of note, patient with throat clears throughout evaluation (before/after PO administration) which he attributes to sinus drainage. Recommend continuation of current diet (regular/thin) per MBS recommendations. Per MBS results, patient able to clear occasional penetrates via spontaneous throat clear/cough. Medications should be administered whole in puree per MBS recommendations. Patient should utilize standardized swallowing precautions during mealtimes including sitting upright and taking single, small and slow bites/sips.  Cognitive-Linguistic: Patient was evaluated via the Cognistat to assess cognitive-lingustic skills. Patient scored WFL on all subtests with the exception of mild deficits in visual spatial skills. Patient reports no cognitive changes since admission, though aware of physical deficits. Attention and receptive/expressive language WFL. Recommend targeting higher level problem solving skills during inpatient stay as patient managed own medications/finances prior to admission.  Dysarthria: Patient is 100% intelligible at the conversational level.  Pt would benefit from skilled ST services to maximize cognition and dysphagia in order to maximize functional independence at d/c.   Skilled Therapeutic Interventions          Patient evaluated using a standardized cognitive linguistic assessment and bedside swallow evaluation to assess current cognitive, communicative and swallowing function. See above for details.    SLP Assessment  Patient will need skilled Speech Lanaguage Pathology Services during CIR admission    Recommendations  SLP Diet Recommendations: Age appropriate regular solids;Thin Liquid Administration  via: Cup Medication Administration: Whole meds with puree Supervision: Patient able to self feed;Intermittent supervision to cue for compensatory strategies Compensations: Slow rate;Small sips/bites Postural Changes and/or Swallow Maneuvers: Seated upright 90 degrees Oral Care Recommendations: Oral care BID Patient destination: Home Follow up Recommendations: None Equipment Recommended: None recommended by SLP    SLP Frequency 1 to 3 out of 7 days   SLP Duration  SLP Intensity  SLP Treatment/Interventions 18-21 days  Minumum of 1-2 x/day, 30 to 90 minutes  Cognitive remediation/compensation;Dysphagia/aspiration precaution training;Internal/external aids;Cueing hierarchy;Therapeutic Activities;Functional tasks;Patient/family education    Pain None reported   SLP Evaluation Cognition Overall Cognitive Status: Impaired/Different from baseline Arousal/Alertness: Awake/alert Orientation Level: Oriented X4 Year: 2025 Month: March Day of Week: Correct Attention: Sustained Sustained Attention: Appears intact Memory: Appears intact Awareness: Appears intact Problem Solving: Impaired Problem Solving Impairment: Verbal complex;Functional complex  Comprehension Auditory Comprehension Overall Auditory Comprehension: Appears within functional limits for tasks assessed Expression Expression Primary Mode of Expression: Verbal Verbal Expression Overall Verbal Expression: Appears within functional limits for tasks assessed Oral Motor Oral Motor/Sensory Function Overall Oral Motor/Sensory Function: Within functional limits Motor Speech Overall Motor Speech: Appears within functional limits for tasks assessed  Care Tool Care Tool Cognition Ability to hear (with hearing aid or hearing appliances if normally used Ability to hear (with hearing aid or hearing appliances if normally used): 0. Adequate - no difficulty in normal conservation, social interaction, listening to TV    Expression of Ideas and Wants Expression of Ideas and Wants: 4. Without difficulty (complex and basic) - expresses complex messages without difficulty and with speech that is clear and easy to understand   Understanding Verbal and Non-Verbal Content Understanding Verbal and Non-Verbal Content: 4. Understands (complex and basic) - clear comprehension without cues or repetitions  Memory/Recall Ability Memory/Recall Ability : Current season;That he or she is in a hospital/hospital unit    Bedside Swallowing  Assessment General Diet Prior to this Study: Regular;Thin liquids (Level 0) Respiratory Status: Room air Behavior/Cognition: Alert;Cooperative;Pleasant mood Oral Cavity - Dentition: Adequate natural dentition Self-Feeding Abilities: Able to feed self Patient Positioning: Upright in chair/Tumbleform Baseline Vocal Quality: Normal Volitional Cough: Strong Volitional Swallow: Able to elicit  Ice Chips Ice chips: Not tested Thin Liquid Thin Liquid: Impaired Presentation: Cup;Straw;Self Fed Pharyngeal  Phase Impairments: Throat Clearing - Immediate Nectar Thick Nectar Thick Liquid: Not tested Honey Thick Honey Thick Liquid: Not tested Puree Puree: Impaired Pharyngeal Phase Impairments: Throat Clearing - Delayed Solid Solid: Impaired Presentation: Self Fed Pharyngeal Phase Impairments: Throat Clearing - Delayed BSE Assessment Risk for Aspiration Impact on safety and function: Mild aspiration risk Other Related Risk Factors: History of GERD;Deconditioning  Short Term Goals: Week 1: SLP Short Term Goal 1 (Week 1): Patient will utilize swallowing compensatory strategies during consumption of regular/thin diet with min multimodal A SLP Short Term Goal 2 (Week 1): Patient will demonstrate problem solving skills in mildly complex situations given min multimodal A  Refer to Care Plan for Long Term Goals  Recommendations for other services: None   Discharge Criteria: Patient will  be discharged from SLP if patient refuses treatment 3 consecutive times without medical reason, if treatment goals not met, if there is a change in medical status, if patient makes no progress towards goals or if patient is discharged from hospital.  The above assessment, treatment plan, treatment alternatives and goals were discussed and mutually agreed upon: by patient  Taylah Dubiel M.A., CCC-SLP 01/13/2024, 10:40 AM

## 2024-01-13 NOTE — Evaluation (Signed)
 Physical Therapy Assessment and Plan  Patient Details  Name: Tyler David MRN: [id number] Date of Birth: Nov 16, 1945  PT Diagnosis: Coordination disorder, Hemiplegia dominant, and Muscle weakness Rehab Potential: Fair ELOS: 2.5-3 weeks.   Today's Date: 01/13/2024 PT Individual Time: 4098-1191 PT Individual Time Calculation (min): 72 min    Hospital Problem: Principal Problem:   Cardioembolic stroke [facility])   Past Medical History:  Past Medical History:  Diagnosis Date   Arthritis    Cancer (HCC)    prostate cancer    Cardiomyopathy (HCC)    Chronic kidney disease, stage 3 (HCC)    Coronary artery disease    ED (erectile dysfunction)    GERD (gastroesophageal reflux disease)    Headache    hx of migraine-2015    Hyperlipidemia    Hypertension    Hypothyroidism    LBBB (left bundle branch block)    Migraine    Nocturia    Overactive bladder    Pneumonia    hx of at age 78    Past Surgical History:  Past Surgical History:  Procedure Laterality Date   CARPAL TUNNEL RELEASE Bilateral    CORONARY ARTERY BYPASS GRAFT N/A 01/04/2024   Procedure: CORONARY ARTERY BYPASS GRAFTING TIMES THREE USING LEFT INTERNAL MAMMARY ARTERY AND RIGHT GREAT SAPHENOUS VEIN HARVESTED ENDOSCOPICALLY;  Surgeon: Corliss Skains, MD;  Location: [facility] OR;  Service: Open Heart Surgery;  Laterality: N/A;   INTRAOPERATIVE TRANSESOPHAGEAL ECHOCARDIOGRAM N/A 01/04/2024   Procedure: ECHOCARDIOGRAM, TRANSESOPHAGEAL, INTRAOPERATIVE;  Surgeon: Corliss Skains, MD;  Location: [facility] OR;  Service: Open Heart Surgery;  Laterality: N/A;   PROSTATE SURGERY     RIGHT/LEFT HEART CATH AND CORONARY ANGIOGRAPHY Bilateral 12/15/2023   Procedure: RIGHT/LEFT HEART CATH AND CORONARY ANGIOGRAPHY;  Surgeon: Yvonne Kendall, MD;  Location: [facility] INVASIVE CV LAB;  Service: Cardiovascular;  Laterality: Bilateral;   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   Procedure: LEFT TOTAL HIP ARTHROPLASTY ANTERIOR APPROACH;  Surgeon:  Durene Romans, MD;  Location: [facility] ORS;  Service: Orthopedics;  Laterality: Left;    Assessment & Plan Clinical Impression: Tyler David is a 78 year old right-handed male with history significant for hypertension, migraine headaches, hyperlipidemia, CKD stage III, prostate cancer, left total hip arthroplasty 09/29/2015, coronary artery calcification/cardiomyopathy/left bundle branch block. Per chart review patient lives with spouse and family. 1 level home with level entry. Independent prior to admission. Presented 01/04/2024 with chest pain and shortness of breath. Workup suggestive of pulmonary edema. Echocardiogram revealed ejection fraction of 35 to 40%. Underwent cardiac catheterization demonstrating multivessel obstructive coronary artery disease. Underwent CABG x 3 01/04/2024 per Dr. Cliffton Asters with endoscopic greater saphenous vein harvest on the right. Remained on dobutamine intubated was transferred to the ICU. Hospital course complicated by atrial fibrillation with rapid ventricular response postop day 1 which was treated with amiodarone. On 01/06/2024 patient developed right lower extremity weakness. MRI of the brain revealed acute/subacute nonhemorrhagic infarction throughout both cerebral hemispheres and left greater than right cerebellum. Acute/subacute infarct in the left ACA distribution were seen. Additionally there were posterior medial parietal lobe infarcts bilaterally left greater than right in addition to acute nonhemorrhagic infarcts in the ACA/MCA watershed territory bilaterally. Patient was seen by neurology services. Recommendations were to transition from IV heparin to Eliquis when appropriate as well as low-dose aspirin. Cardiac rate remains controlled and he continues on amiodarone as directed. Tolerating a regular consistency diet. Therapy evaluations completed due to patient's decreased functional mobility was admitted for a comprehensive rehab program.  Patient currently  requires max with mobility secondary to muscle weakness and impaired timing and sequencing and decreased coordination.  Prior to hospitalization, patient was independent  with mobility and lived with Spouse, Daughter in a House home.  Home access is  Level entry (only threshhold at entrance.).  Patient will benefit from skilled PT intervention to maximize safe functional mobility, minimize fall risk, and decrease caregiver burden for planned discharge home with 24 hour supervision.  Anticipate patient will benefit from follow up HH at discharge.  PT - End of Session Activity Tolerance: Tolerates 10 - 20 min activity with multiple rests Endurance Deficit: Yes PT Assessment Rehab Potential (ACUTE/IP ONLY): Fair PT Barriers to Discharge: Decreased caregiver support PT Patient demonstrates impairments in the following area(s): Balance;Safety;Endurance;Motor PT Transfers Functional Problem(s): Bed Mobility;Bed to Chair;Car;Furniture PT Locomotion Functional Problem(s): Ambulation;Stairs PT Plan PT Intensity: Minimum of 1-2 x/day ,45 to 90 minutes PT Frequency: 5 out of 7 days PT Duration Estimated Length of Stay: 2.5-3 weeks. PT Treatment/Interventions: Ambulation/gait training;Community reintegration;Neuromuscular re-education;Stair training;Psychosocial support;UE/LE Strength taining/ROM;Balance/vestibular training;Discharge planning;Therapeutic Activities;UE/LE Coordination activities;Therapeutic Exercise;Splinting/orthotics;Patient/family education;Functional mobility training PT Transfers Anticipated Outcome(s): CGA PT Locomotion Anticipated Outcome(s): CGA PT Recommendation Recommendations for Other Services: Neuropsych consult Follow Up Recommendations: Home health PT Patient destination: Home Equipment Recommended: To be determined Equipment Details: pt states having RW   PT Evaluation Precautions/Restrictions Precautions Precautions: Fall;Sternal Precaution/Restrictions Comments:  R hemi Restrictions Other Position/Activity Restrictions: sternal General Chart Reviewed: Yes Family/Caregiver Present: Yes Vital SignsTherapy Vitals Temp: 97.8 F (36.6 C) Pulse Rate: 70 Resp: 16 BP: (!) 154/57 Patient Position (if appropriate): Sitting Oxygen Therapy SpO2: 100 % O2 Device: Room Air Pain Pain Assessment Pain Scale: 0-10 Pain Score: 0-No pain Pain Location: Buttocks (buttocks sore from sitting, Roho cushion retrieved for seat.) Pain Interference Pain Interference Pain Effect on Sleep: 1. Rarely or not at all (only first night after surgery.) Pain Interference with Therapy Activities: 1. Rarely or not at all Pain Interference with Day-to-Day Activities: 1. Rarely or not at all Home Living/Prior Functioning Home Living Available Help at Discharge: Family;Available 24 hours/day Type of Home: House Home Access: Level entry (only threshhold at entrance.) Home Layout: One level Bathroom Shower/Tub: Walk-in shower  Lives With: Spouse;Daughter Prior Function Level of Independence: Independent with transfers;Independent with gait  Able to Take Stairs?: Yes Driving: Yes Vision/Perception  Vision - History Ability to See in Adequate Light: 0 Adequate Perception Perception: Within Functional Limits Praxis Praxis: WFL  Cognition Overall Cognitive Status: Within Functional Limits for tasks assessed Arousal/Alertness: Awake/alert Sustained Attention: Appears intact Awareness: Appears intact Safety/Judgment: Appears intact Sensation Sensation Light Touch: Appears Intact Peripheral sensation comments: Reports of residual paresthesia in R hand since carpal tunnel surgery. Light Touch Impaired Details: Impaired RUE Hot/Cold: Appears Intact Proprioception: Impaired by gross assessment Stereognosis: Not tested Coordination Gross Motor Movements are Fluid and Coordinated: No Fine Motor Movements are Fluid and Coordinated: No Coordination and Movement  Description: R hemi Heel Shin Test: RLE unable. Motor  Motor Motor: Hemiplegia Motor - Skilled Clinical Observations: Sternal precautions inhibit improved transfers.   Trunk/Postural Assessment  Cervical Assessment Cervical Assessment: Exceptions to [facility] (forward head.) Thoracic Assessment Thoracic Assessment: Exceptions to [facility] (rounded shoulders.) Lumbar Assessment Lumbar Assessment: Exceptions to [facility] (posterior pelvic tilt.) Postural Control Postural Control: Deficits on evaluation Righting Reactions: Delayed Protective Responses: Delayed  Balance Balance Balance Assessed: Yes Static Sitting Balance Static Sitting - Balance Support: Feet supported Static Sitting - Level of Assistance: 5: Stand by assistance Dynamic Sitting Balance  Dynamic Sitting - Balance Support: Feet supported;During functional activity Dynamic Sitting - Level of Assistance: 5: Stand by assistance Dynamic Sitting - Balance Activities: Forward lean/weight shifting;Reaching for objects;Lateral lean/weight shifting Static Standing Balance Static Standing - Balance Support: No upper extremity supported;During functional activity Static Standing - Level of Assistance: 2: Max assist;3: Mod assist Dynamic Standing Balance Dynamic Standing - Balance Support: No upper extremity supported;During functional activity Dynamic Standing - Level of Assistance: 3: Mod assist;2: Max assist Dynamic Standing - Balance Activities: Lateral lean/weight shifting;Forward lean/weight shifting;Reaching for objects Extremity Assessment  RUE Assessment RUE Assessment: Exceptions to [facility] General Strength Comments: 3-/5; Dysmetria RUE Body System: Neuro Brunstrum levels for arm and hand: Arm;Hand Brunstrum level for arm: Stage V Relative Independence from Synergy Brunstrum level for hand: Stage VI Isolated joint movements LUE Assessment LUE Assessment: Exceptions to [facility] General Strength Comments: 3-/5 RLE Assessment RLE  Assessment: Exceptions to [facility] RLE Strength Right Knee Flexion: 2/5 Right Knee Extension: 2+/5 LLE Assessment LLE Assessment: Within Functional Limits General Strength Comments: at least 3+/5, not fully tested 2/2 cardiac sx, generalized weakness/fatigue.  Care Tool Care Tool Bed Mobility Roll left and right activity   Roll left and right assist level: Moderate Assistance - Patient 50 - 74%    Sit to lying activity   Sit to lying assist level: Maximal Assistance - Patient 25 - 49%    Lying to sitting on side of bed activity   Lying to sitting on side of bed assist level: the ability to move from lying on the back to sitting on the side of the bed with no back support.: Maximal Assistance - Patient 25 - 49%     Care Tool Transfers Sit to stand transfer   Sit to stand assist level: Maximal Assistance - Patient 25 - 49%    Chair/bed transfer   Chair/bed transfer assist level: Maximal Assistance - Patient 25 - 49%    Car transfer   Car transfer assist level: Maximal Assistance - Patient 25 - 49%      Care Tool Locomotion Ambulation Ambulation activity did not occur: Safety/medical concerns        Walk 10 feet activity Walk 10 feet activity did not occur: Safety/medical concerns       Walk 50 feet with 2 turns activity Walk 50 feet with 2 turns activity did not occur: Safety/medical concerns      Walk 150 feet activity Walk 150 feet activity did not occur: Safety/medical concerns      Walk 10 feet on uneven surfaces activity Walk 10 feet on uneven surfaces activity did not occur: Safety/medical concerns      Stairs Stair activity did not occur: Safety/medical concerns        Walk up/down 1 step activity Walk up/down 1 step or curb (drop down) activity did not occur: Safety/medical concerns      Walk up/down 4 steps activity Walk up/down 4 steps activity did not occur: Safety/medical concerns      Walk up/down 12 steps activity Walk up/down 12 steps activity did  not occur: Safety/medical concerns      Pick up small objects from floor Pick up small object from the floor (from standing position) activity did not occur: Safety/medical concerns      Wheelchair Is the patient using a wheelchair?: Yes Type of Wheelchair: Manual   Wheelchair assist level: Dependent - Patient 0%    Wheel 50 feet with 2 turns activity   Assist Level: Dependent - Patient  0%  Wheel 150 feet activity   Assist Level: Dependent - Patient 0%    Refer to Care Plan for Long Term Goals  SHORT TERM GOAL WEEK 1 PT Short Term Goal 1 (Week 1): Pt will roll side to side w/ min A. PT Short Term Goal 2 (Week 1): Pt will transfer sit to stand w/ mod A consistently. PT Short Term Goal 3 (Week 1): Pt will transfer SPT bed <> w/c w/ mod A consistently. PT Short Term Goal 4 (Week 1): PT will assess gait as able.  Recommendations for other services: Neuropsych  Skilled Therapeutic Intervention Evaluation completed (see details above and below) with education on PT POC and goals and individual treatment initiated with focus on  endurance, transfers, strengthening, NMR, gait and balance.  Pt presents sitting in w/c and c/o extreme fatigue from previous therapies, but agreeable to evaluation.  Pt also c/o sore bottom from w/c.  Pt wheeled to small gym and pt performed sit to stand w/ max A and rocking, w/ multiple attempts needed for successful sit to stand, maintaining sternal precautions.  Pt recalled 1/3 precautions, educated on sternal precautions.  Pt required max A and facilitation for weight shifting and manual A for RLE advancement.  Pt required max A to bring LES into car at SUV height.  Pt O2 at 97% and HR of 71.  Pt required extended seated rest breaks 2/2 fatigue.  Pt returned to w/c w/ max A.  Pt wheeled to outside for change in scenery.  Pt returned to room and performed sit to stand w/ max A and rocking w/ SPT w/ same A for weight shift and RLE advancement.  Pt w/o R knee buckle.   Pt required max A for log roll technique to R sidelying and remained w/ pillow between knees and cardiac pillow at chest for comfort.  Roho cushion retrieved for w/c.  Pt asleep w/ bed alarm on and all needs in reach, spouse present.   Mobility Bed Mobility Bed Mobility: Rolling Right;Sit to Sidelying Right;Right Sidelying to Sit Rolling Right: Moderate Assistance - Patient 50-74% Right Sidelying to Sit: Maximal Assistance - Patient 25-49% Sit to Sidelying Right: Maximal Assistance - Patient 25-49% Transfers Transfers: Sit to Stand;Stand to Sit;Stand Pivot Transfers Sit to Stand: Maximal Assistance - Patient 25-49% (Pt w/ extreme fatigue from day's activities.) Stand to Sit: Maximal Assistance - Patient 25-49% (Pt w/ extreme fatigue from day's activities.) Stand Pivot Transfers: Maximal Assistance - Patient 25 - 49% (Pt w/ extreme fatigue from day's activities.) Stand Pivot Transfer Details: Manual facilitation for weight shifting;Manual facilitation for weight bearing;Verbal cues for sequencing Stand Pivot Transfer Details (indicate cue type and reason): max A w/ weight shift and manual A for RLE movement, no knee buckling. Transfer (Assistive device): None Locomotion  Gait Ambulation: No Gait Gait: No Stairs / Additional Locomotion Stairs: No Wheelchair Mobility Wheelchair Mobility: No (sternal precautions.)   Discharge Criteria: Patient will be discharged from PT if patient refuses treatment 3 consecutive times without medical reason, if treatment goals not met, if there is a change in medical status, if patient makes no progress towards goals or if patient is discharged from hospital.  The above assessment, treatment plan, treatment alternatives and goals were discussed and mutually agreed upon: by patient and by family  Lucio Edward 01/13/2024, 4:07 PM

## 2024-01-13 NOTE — Progress Notes (Signed)
 PROGRESS NOTE   Subjective/Complaints:  Mild HTN, otherwise vitals stable overnight. Restless per nursing, refused PRN melatonin.  Ate 90% breakfast Continent b/b, LBM 3/29, medium  No AM labs  ROS: Denies fevers, chills, N/V, abdominal pain, constipation, diarrhea, SOB, cough, chest pain, new weakness or paraesthesias.    Objective:   No results found. Recent Labs    01/11/24 0409 01/12/24 0320  WBC 8.1 10.5  HGB 9.9* 9.8*  HCT 30.1* 30.5*  PLT 283 324   Recent Labs    01/11/24 0409  NA 141  K 3.9  CL 109  CO2 24  GLUCOSE 105*  BUN 36*  CREATININE 2.20*  CALCIUM 7.9*    Intake/Output Summary (Last 24 hours) at 01/13/2024 0903 Last data filed at 01/13/2024 0746 Gross per 24 hour  Intake 477 ml  Output 625 ml  Net -148 ml        Physical Exam: Vital Signs Blood pressure (!) 155/64, pulse 72, temperature 98 F (36.7 C), resp. rate 18, height 5\' 8"  (1.727 m), weight 87.3 kg, SpO2 94%. Constitutional: No apparent distress. Appropriate appearance for age.  HENT: No JVD. Neck Supple. Trachea midline. Atraumatic, normocephalic. Eyes: PERRLA. EOMI. Visual fields grossly intact.  Cardiovascular: RRR, no murmurs/rub/gallops. Trace RLE Edema. Peripheral pulses 2+  Respiratory: CTAB. No rales, rhonchi, or wheezing. On RA.  Abdomen: + bowel sounds, normoactive. No distention or tenderness.  Skin: C/D/I. No apparent lesions. MSK:      No apparent deformity.   Neurologic exam:   Patient is alert.  Makes eye contact with examiner follows commands.  He provides name and age but does display some delay in processing and problems solving. STM deficits. CN exam was non-focal today.  RUE 4+/5. LUE 3 to 3+/5 prox to 4- at hand and wrist. RLE 2/5 HF, KE and trace to absent at ankle. LLE 3-4/5 prox to distal. Pt senses light touch and pain fairly equally in all 4's. DTR's brisk and 2+ in all 4's. Toes up right foot.         Assessment/Plan: 1. Functional deficits which require 3+ hours per day of interdisciplinary therapy in a comprehensive inpatient rehab setting. Physiatrist is providing close team supervision and 24 hour management of active medical problems listed below. Physiatrist and rehab team continue to assess barriers to discharge/monitor patient progress toward functional and medical goals  Care Tool:  Bathing              Bathing assist       Upper Body Dressing/Undressing Upper body dressing        Upper body assist      Lower Body Dressing/Undressing Lower body dressing            Lower body assist       Toileting Toileting    Toileting assist       Transfers Chair/bed transfer  Transfers assist           Locomotion Ambulation   Ambulation assist              Walk 10 feet activity   Assist           Walk  50 feet activity   Assist           Walk 150 feet activity   Assist           Walk 10 feet on uneven surface  activity   Assist           Wheelchair     Assist               Wheelchair 50 feet with 2 turns activity    Assist            Wheelchair 150 feet activity     Assist          Blood pressure (!) 155/64, pulse 72, temperature 98 F (36.7 C), resp. rate 18, height 5\' 8"  (1.727 m), weight 87.3 kg, SpO2 94%.  Medical Problem List and Plan: 1. 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  Sternal precautions             -patient may  shower             -ELOS/Goals: 18-21 days, goals supervision to min assist with PT and OT and mod I to supervision with SLP   - stable to continue IRF  2.  Antithrombotics: -DVT/anticoagulation:  Pharmaceutical: Eliquis             -antiplatelet therapy: Aspirin 81 mg daily 3. Pain Management: Oxycodone as needed 4. Mood/Behavior/Sleep: Provide emotional support              -antipsychotic agents: N/A             -pt with persistent fatigue             -record sleep chart             -melatonin prn insomnia 5. Neuropsych/cognition: This patient is capable of making decisions on his own behalf. 6. Skin/Wound Care: Routine skin checks 7. Fluids/Electrolytes/Nutrition: Routine in and outs with follow-up chemistries on Monday             -encourage PO 8.  Postoperative atrial fibrillation.  Amiodarone 400 mg twice daily as well as Lopressor 12.5 mg twice daily.  Will discuss plan to decrease dosage 9.  Acute blood loss anemia.  Latest hemoglobin 9.8.  Follow-up CBC 10.  Hypertension.  Norvasc 10 mg daily.  Monitor with increased mobility 11.  Hyperlipidemia.  Zetia 12.  Hypothyroidism.  Synthroid 13.  CKD stage III.  Creatinine baseline 1.98-2.41             -f/u labs Monday  14.  GERD.  Protonix    LOS: 1 days A FACE TO FACE EVALUATION WAS PERFORMED  Angelina Sheriff 01/13/2024, 9:03 AM

## 2024-01-14 DIAGNOSIS — I639 Cerebral infarction, unspecified: Secondary | ICD-10-CM | POA: Diagnosis not present

## 2024-01-14 MED ORDER — FUROSEMIDE 20 MG PO TABS
20.0000 mg | ORAL_TABLET | Freq: Every day | ORAL | Status: DC
  Administered 2024-01-14 – 2024-01-16 (×3): 20 mg via ORAL
  Filled 2024-01-14 (×3): qty 1

## 2024-01-14 NOTE — Progress Notes (Signed)
 Sat in recliner until 2200. PRN melatonin given at 2118. Slept/rested much better than Friday night. BLE edema, right >left. Large BM  on previous shift. Tyler David A

## 2024-01-14 NOTE — Progress Notes (Signed)
 PROGRESS NOTE   Subjective/Complaints:  Reportedly slept much better after administration of melatonin. Remains mildly hypertensive, systolic 150s to 098J.  Other vital stable.  Wife and patient concerned regarding right greater than left lower extremity edema; did have vein harvesting from this leg, but is having some increased discomfort in his medial thigh.  ROS: Denies fevers, chills, N/V, abdominal pain, constipation, diarrhea, SOB, cough, chest pain, new weakness or paraesthesias.   + Right leg pain + Right > Left lower extremity edema  Objective:   No results found. Recent Labs    01/12/24 0320 01/13/24 1050  WBC 10.5 12.2*  HGB 9.8* 9.5*  HCT 30.5* 29.5*  PLT 324 345   Recent Labs    01/13/24 1050  NA 140  K 4.4  CL 111  CO2 20*  GLUCOSE 95  BUN 40*  CREATININE 2.08*  CALCIUM 7.9*    Intake/Output Summary (Last 24 hours) at 01/14/2024 1018 Last data filed at 01/14/2024 0816 Gross per 24 hour  Intake 876 ml  Output 750 ml  Net 126 ml        Physical Exam: Vital Signs Blood pressure (!) 151/63, pulse 75, temperature 98.4 F (36.9 C), temperature source Oral, resp. rate 16, height 5\' 8"  (1.727 m), weight 87.3 kg, SpO2 93%. Constitutional: No apparent distress. Appropriate appearance for age.  Sitting up in bedside chair. HENT: No JVD. Neck Supple. Trachea midline. Atraumatic, normocephalic. Eyes: PERRLA. EOMI. Visual fields grossly intact.  Cardiovascular: RRR, no murmurs/rub/gallops.  1+ right lower extremity edema, trace left lower extremity edema; increased from prior  Respiratory: CTAB. No rales, rhonchi, or wheezing. On RA.  Abdomen: + bowel sounds, normoactive. No distention or tenderness.   Skin: Vein harvesting site from right lower extremity appears clean, dry, well-approximated.  No surrounding erythema or warmth.  MSK:      No apparent deformity.   Neurologic exam:  Awake, alert,  oriented x 3. Some mild cognitive delay and memory issues No dysarthria, language intact Cranial nerves II through XII grossly intact Strength: RUE 4+/5. LUE 3 to 3+/5 prox to 4- at hand and wrist. RLE 2/5 HF, KE and trace to absent at ankle. LLE 3-4/5 prox to distal.  Light touch: Intact in all 4 extremities Reflexes: DTR's brisk and 2+ in all 4's. Toes up right foot.    No significant changes 3-30   Assessment/Plan: 1. Functional deficits which require 3+ hours per day of interdisciplinary therapy in a comprehensive inpatient rehab setting. Physiatrist is providing close team supervision and 24 hour management of active medical problems listed below. Physiatrist and rehab team continue to assess barriers to discharge/monitor patient progress toward functional and medical goals  Care Tool:  Bathing    Body parts bathed by patient: Right arm, Left arm, Chest, Abdomen, Front perineal area, Face, Right upper leg, Left upper leg   Body parts bathed by helper: Buttocks, Right lower leg, Left lower leg     Bathing assist Assist Level: Moderate Assistance - Patient 50 - 74%     Upper Body Dressing/Undressing Upper body dressing   What is the patient wearing?: Pull over shirt    Upper body assist Assist  Level: Set up assist    Lower Body Dressing/Undressing Lower body dressing      What is the patient wearing?: Underwear/pull up, Pants     Lower body assist Assist for lower body dressing: Maximal Assistance - Patient 25 - 49%     Toileting Toileting    Toileting assist Assist for toileting: Maximal Assistance - Patient 25 - 49%     Transfers Chair/bed transfer  Transfers assist     Chair/bed transfer assist level: Maximal Assistance - Patient 25 - 49%     Locomotion Ambulation   Ambulation assist   Ambulation activity did not occur: Safety/medical concerns          Walk 10 feet activity   Assist  Walk 10 feet activity did not occur: Safety/medical  concerns        Walk 50 feet activity   Assist Walk 50 feet with 2 turns activity did not occur: Safety/medical concerns         Walk 150 feet activity   Assist Walk 150 feet activity did not occur: Safety/medical concerns         Walk 10 feet on uneven surface  activity   Assist Walk 10 feet on uneven surfaces activity did not occur: Safety/medical concerns         Wheelchair     Assist Is the patient using a wheelchair?: Yes Type of Wheelchair: Manual    Wheelchair assist level: Dependent - Patient 0%      Wheelchair 50 feet with 2 turns activity    Assist        Assist Level: Dependent - Patient 0%   Wheelchair 150 feet activity     Assist      Assist Level: Dependent - Patient 0%   Blood pressure (!) 151/63, pulse 75, temperature 98.4 F (36.9 C), temperature source Oral, resp. rate 16, height 5\' 8"  (1.727 m), weight 87.3 kg, SpO2 93%.  Medical Problem List and Plan: 1. 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  Sternal precautions             -patient may  shower             -ELOS/Goals: 18-21 days, goals supervision to min assist with PT and OT and mod I to supervision with SLP   - stable to continue IRF  2.  Antithrombotics: -DVT/anticoagulation:  Pharmaceutical: Eliquis             -antiplatelet therapy: Aspirin 81 mg daily  -3-30: Bilateral lower extremity duplex ordered for right lower extremity increased edema, pain.  Pending  3. Pain Management: Oxycodone as needed 4. Mood/Behavior/Sleep: Provide emotional support             -antipsychotic agents: N/A             -pt with persistent fatigue             -record sleep chart             -melatonin prn insomnia  3-29: Patient refused as needed melatonin overnight; encouraged use as needed  3-30: Slept much better with melatonin  5. Neuropsych/cognition: This patient is capable of making  decisions on his own behalf. 6. Skin/Wound Care: Routine skin checks 7. Fluids/Electrolytes/Nutrition: Routine in and outs with follow-up chemistries on Monday             -encourage PO 8.  Postoperative atrial fibrillation.  Amiodarone 400 mg  twice daily as well as Lopressor 12.5 mg twice daily.  Will discuss plan to decrease dosage  -Regular rhythm.  9.  Acute blood loss anemia.  Latest hemoglobin 9.8.  Follow-up CBC  -3-4 9: Admission hemoglobin 9.5; stable  10.  Hypertension/HFrEF.  Norvasc 10 mg daily. Lopressor 12.5 m BID.  Monitor with increased mobility  -01-12-29: Moderately hypertensive; some permission given recent CVA.  Monitor for now.  3-30: Remains hypertensive, increasing peripheral edema, discharge meds include Lasix 40 mg daily--held on rehab admission.  Will start daily weights and resume Lasix at 20 mg daily. Last BNP per chart review 570; will add to AM labs.      01/14/2024    9:01 AM 01/14/2024    4:09 AM 01/13/2024    8:01 PM  Vitals with BMI  Systolic 151 164 161  Diastolic 63 68 59  Pulse 75 73 71   Filed Weights   01/12/24 1400  Weight: 87.3 kg    11.  Hyperlipidemia.  Zetia 12.  Hypothyroidism.  Synthroid 13.  CKD stage III.  Creatinine baseline 1.98-2.41             -f/u labs Monday   3-29: Admission labs stable, creatinine down to 2.08  14.  GERD.  Protonix 15.  Leukocytosis.  Mild, 12.2 today from 10.5 prior.  Monitor for systemic signs of illness and repeat labs Monday.  -3-30: No fevers, no concerning signs of infection.  Will check duplex in case of right lower extremity DVT due to increasing edema in this leg--likely just from vein harvesting  LOS: 2 days A FACE TO FACE EVALUATION WAS PERFORMED  Angelina Sheriff 01/14/2024, 10:18 AM

## 2024-01-14 NOTE — Care Management (Signed)
 Inpatient Rehabilitation Center Individual Statement of Services  Patient Name:  Tyler David  Date:  01/14/2024  Welcome to the Inpatient Rehabilitation Center.  Our goal is to provide you with an individualized program based on your diagnosis and situation, designed to meet your specific needs.  With this comprehensive rehabilitation program, you will be expected to participate in at least 3 hours of rehabilitation therapies Monday-Friday, with modified therapy programming on the weekends.  Your rehabilitation program will include the following services:  Physical Therapy (PT), Occupational Therapy (OT), Speech Therapy (ST), 24 hour per day rehabilitation nursing, Therapeutic Recreaction (TR), Psychology, Neuropsychology, Care Coordinator, Rehabilitation Medicine, Nutrition Services, Pharmacy Services, and Other  Weekly team conferences will be held on Tuesdays to discuss your progress.  Your Inpatient Rehabilitation Care Coordinator will talk with you frequently to get your input and to update you on team discussions.  Team conferences with you and your family in attendance may also be held.  Expected length of stay: 17-21 days    Overall anticipated outcome: Supervision  Depending on your progress and recovery, your program may change. Your Inpatient Rehabilitation Care Coordinator will coordinate services and will keep you informed of any changes. Your Inpatient Rehabilitation Care Coordinator's name and contact numbers are listed  below.  The following services may also be recommended but are not provided by the Inpatient Rehabilitation Center:  Driving Evaluations Home Health Rehabiltiation Services Outpatient Rehabilitation Services Vocational Rehabilitation   Arrangements will be made to provide these services after discharge if needed.  Arrangements include referral to agencies that provide these services.  Your insurance has been verified to be:  [employer]  Your  primary doctor is:  Johny Blamer  Pertinent information will be shared with your doctor and your insurance company.  Inpatient Rehabilitation Care Coordinator:  Susie Cassette [phone] or [phone]  Information discussed with and copy given to patient by: Gretchen Short, 01/14/2024, 7:20 PM

## 2024-01-14 NOTE — Plan of Care (Signed)
  Problem: Consults Goal: RH STROKE PATIENT EDUCATION Description: See Patient Education module for education specifics  Outcome: Progressing   Problem: RH BOWEL ELIMINATION Goal: RH STG MANAGE BOWEL WITH ASSISTANCE Description: STG Manage Bowel with minimal  Assistance. Outcome: Progressing   Problem: RH BLADDER ELIMINATION Goal: RH STG MANAGE BLADDER WITH ASSISTANCE Description:  Manage Bladder with minimal   Assistance Outcome: Progressing   Problem: RH SKIN INTEGRITY Goal: RH STG SKIN FREE OF INFECTION/BREAKDOWN Description: Manage skin free of infection with minimal assistance Outcome: Progressing   Problem: RH SAFETY Goal: RH STG ADHERE TO SAFETY PRECAUTIONS W/ASSISTANCE/DEVICE Description: STG Adhere to Safety Precautions With minimal Assistance/Device. Outcome: Progressing   Problem: RH COGNITION-NURSING Goal: RH STG USES MEMORY AIDS/STRATEGIES W/ASSIST TO PROBLEM SOLVE Description: STG Uses Memory Aids/Strategies With minimal Assistance to Problem Solve. Outcome: Progressing   Problem: RH PAIN MANAGEMENT Goal: RH STG PAIN MANAGED AT OR BELOW PT'S PAIN GOAL Description: <4 w/  prns Outcome: Progressing   Problem: RH KNOWLEDGE DEFICIT Goal: RH STG INCREASE KNOWLEDGE OF DIABETES Description:  Manage increase  knowledge of diabetes with minimal assistance using educational materials provided  Outcome: Progressing Goal: RH STG INCREASE KNOWLEDGE OF HYPERTENSION Description: Manage increase  knowledge of hypertension with minimal assistance using educational materials provided  Outcome: Progressing Goal: RH STG INCREASE KNOWLEGDE OF HYPERLIPIDEMIA Description: Manage increase  knowledge of hyperlipidemia  with minimal assistance using educational materials provided  Outcome: Progressing Goal: RH STG INCREASE KNOWLEDGE OF STROKE PROPHYLAXIS Description: Manage increase  knowledge of stroke prophylaxis  with minimal assistance using educational materials provided   Outcome: Progressing

## 2024-01-15 ENCOUNTER — Inpatient Hospital Stay (HOSPITAL_COMMUNITY)

## 2024-01-15 DIAGNOSIS — N1832 Chronic kidney disease, stage 3b: Secondary | ICD-10-CM | POA: Diagnosis not present

## 2024-01-15 DIAGNOSIS — M7989 Other specified soft tissue disorders: Secondary | ICD-10-CM

## 2024-01-15 DIAGNOSIS — D72829 Elevated white blood cell count, unspecified: Secondary | ICD-10-CM

## 2024-01-15 DIAGNOSIS — I5020 Unspecified systolic (congestive) heart failure: Secondary | ICD-10-CM

## 2024-01-15 DIAGNOSIS — F5101 Primary insomnia: Secondary | ICD-10-CM

## 2024-01-15 DIAGNOSIS — I10 Essential (primary) hypertension: Secondary | ICD-10-CM | POA: Diagnosis not present

## 2024-01-15 DIAGNOSIS — I639 Cerebral infarction, unspecified: Secondary | ICD-10-CM | POA: Diagnosis not present

## 2024-01-15 LAB — COMPREHENSIVE METABOLIC PANEL WITH GFR
ALT: 17 U/L (ref 0–44)
AST: 21 U/L (ref 15–41)
Albumin: 2.5 g/dL — ABNORMAL LOW (ref 3.5–5.0)
Alkaline Phosphatase: 49 U/L (ref 38–126)
Anion gap: 9 (ref 5–15)
BUN: 30 mg/dL — ABNORMAL HIGH (ref 8–23)
CO2: 23 mmol/L (ref 22–32)
Calcium: 8.3 mg/dL — ABNORMAL LOW (ref 8.9–10.3)
Chloride: 109 mmol/L (ref 98–111)
Creatinine, Ser: 2.22 mg/dL — ABNORMAL HIGH (ref 0.61–1.24)
GFR, Estimated: 30 mL/min — ABNORMAL LOW (ref >60)
Glucose, Bld: 101 mg/dL — ABNORMAL HIGH (ref 70–99)
Potassium: 4.5 mmol/L (ref 3.5–5.1)
Sodium: 141 mmol/L (ref 135–145)
Total Bilirubin: 0.9 mg/dL (ref 0.0–1.2)
Total Protein: 5.7 g/dL — ABNORMAL LOW (ref 6.5–8.1)

## 2024-01-15 LAB — CBC WITH DIFFERENTIAL/PLATELET
Abs Immature Granulocytes: 0.05 10*3/uL (ref 0.00–0.07)
Basophils Absolute: 0.1 10*3/uL (ref 0.0–0.1)
Basophils Relative: 1 %
Eosinophils Absolute: 0.8 10*3/uL — ABNORMAL HIGH (ref 0.0–0.5)
Eosinophils Relative: 9 %
HCT: 29.7 % — ABNORMAL LOW (ref 39.0–52.0)
Hemoglobin: 9.6 g/dL — ABNORMAL LOW (ref 13.0–17.0)
Immature Granulocytes: 1 %
Lymphocytes Relative: 10 %
Lymphs Abs: 0.9 10*3/uL (ref 0.7–4.0)
MCH: 29.2 pg (ref 26.0–34.0)
MCHC: 32.3 g/dL (ref 30.0–36.0)
MCV: 90.3 fL (ref 80.0–100.0)
Monocytes Absolute: 1 10*3/uL (ref 0.1–1.0)
Monocytes Relative: 11 %
Neutro Abs: 6.3 10*3/uL (ref 1.7–7.7)
Neutrophils Relative %: 68 %
Platelets: 384 10*3/uL (ref 150–400)
RBC: 3.29 MIL/uL — ABNORMAL LOW (ref 4.22–5.81)
RDW: 14.2 % (ref 11.5–15.5)
WBC: 9.2 10*3/uL (ref 4.0–10.5)
nRBC: 0 % (ref 0.0–0.2)

## 2024-01-15 LAB — BRAIN NATRIURETIC PEPTIDE: B Natriuretic Peptide: 1263.2 pg/mL — ABNORMAL HIGH (ref 0.0–100.0)

## 2024-01-15 MED ORDER — MELATONIN 5 MG PO TABS
5.0000 mg | ORAL_TABLET | Freq: Every evening | ORAL | Status: DC | PRN
  Administered 2024-01-15 – 2024-01-17 (×3): 5 mg via ORAL
  Filled 2024-01-15 (×3): qty 1

## 2024-01-15 NOTE — Progress Notes (Signed)
 Inpatient Rehabilitation Care Coordinator Assessment and Plan Patient Details  Name: Tyler David MRN: [id number] Date of Birth: [birth year]  Today's Date: 01/15/2024  Hospital Problems: Principal Problem:   Cardioembolic stroke [facility])  Past Medical History:  Past Medical History:  Diagnosis Date   Arthritis    Cancer (HCC)    prostate cancer    Cardiomyopathy (HCC)    Chronic kidney disease, stage 3 (HCC)    Coronary artery disease    ED (erectile dysfunction)    GERD (gastroesophageal reflux disease)    Headache    hx of migraine-2015    Hyperlipidemia    Hypertension    Hypothyroidism    LBBB (left bundle branch block)    Migraine    Nocturia    Overactive bladder    Pneumonia    hx of at age 9    Past Surgical History:  Past Surgical History:  Procedure Laterality Date   CARPAL TUNNEL RELEASE Bilateral    CORONARY ARTERY BYPASS GRAFT N/A 01/04/2024   Procedure: CORONARY ARTERY BYPASS GRAFTING TIMES THREE USING LEFT INTERNAL MAMMARY ARTERY AND RIGHT GREAT SAPHENOUS VEIN HARVESTED ENDOSCOPICALLY;  Surgeon: Corliss Skains, MD;  Location: [facility] OR;  Service: Open Heart Surgery;  Laterality: N/A;   INTRAOPERATIVE TRANSESOPHAGEAL ECHOCARDIOGRAM N/A 01/04/2024   Procedure: ECHOCARDIOGRAM, TRANSESOPHAGEAL, INTRAOPERATIVE;  Surgeon: Corliss Skains, MD;  Location: [facility] OR;  Service: Open Heart Surgery;  Laterality: N/A;   PROSTATE SURGERY     RIGHT/LEFT HEART CATH AND CORONARY ANGIOGRAPHY Bilateral 12/15/2023   Procedure: RIGHT/LEFT HEART CATH AND CORONARY ANGIOGRAPHY;  Surgeon: Yvonne Kendall, MD;  Location: [facility] INVASIVE CV LAB;  Service: Cardiovascular;  Laterality: Bilateral;   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   Procedure: LEFT TOTAL HIP ARTHROPLASTY ANTERIOR APPROACH;  Surgeon: Durene Romans, MD;  Location: [facility] ORS;  Service: Orthopedics;  Laterality: Left;   Social History:  reports that he has never smoked. He has never used smokeless tobacco. He reports  that he does not currently use alcohol. He reports that he does not use drugs.  Family / Support Systems Marital Status: Married How Long?: 54 years Patient Roles: Spouse, Parent Spouse/Significant Other: Dois Davenport (wife)- works from home 3 days a week 8am-5pm Children: 2 adult children- Tacey Ruiz (lives in their home), and Selena Batten (son; lives across the street). Other Supports: none reported Anticipated Caregiver: wife and PRN support from children Ability/Limitations of Caregiver: Pt will d/c tohome with primary support from his wife. Caregiver Availability: 24/7 Family Dynamics: Pt lives with wife, and their dtr lives in the home.  Social History Preferred language: English Religion: Christian Cultural Background: Pt worked as a Medical sales representative until retirement Education: some Charity fundraiser - How often do you need to have someone help you when you read instructions, pamphlets, or other written material from your doctor or pharmacy?: Never Writes: Yes Employment Status: Retired Date Retired/Disabled/Unemployed: Retired Age Retired: 62 Marine scientist Issues: Denies Guardian/Conservator: no HCPOA   Abuse/Neglect Abuse/Neglect Assessment Can Be Completed: Yes Physical Abuse: Denies Verbal Abuse: Denies Sexual Abuse: Denies Exploitation of patient/patient's resources: Denies Self-Neglect: Denies  Patient response to: Social Isolation - How often do you feel lonely or isolated from those around you?: Never  Emotional Status Pt's affect, behavior and adjustment status: Pt in good spirits at time of visit Recent Psychosocial Issues: Denies Psychiatric History: Denies Substance Abuse History: Denies; seldom etoh use  Patient / Family Perceptions, Expectations & Goals Pt/Family understanding of illness & functional limitations: Pt and  wife have a general understandingf of care needs Premorbid pt/family roles/activities: Independent Anticipated changes in  roles/activities/participation: Assistance with ADLs/IADLs Pt/family expectations/goals: pt goal is to work on walking; wife would like him to also work on building up his Pharmacologist: None Premorbid Home Care/DME Agencies: None Transportation available at discharge: TBD Is the patient able to respond to transportation needs?: Yes In the past 12 months, has lack of transportation kept you from medical appointments or from getting medications?: No In the past 12 months, has lack of transportation kept you from meetings, work, or from getting things needed for daily living?: No Resource referrals recommended: Neuropsychology  Discharge Planning Living Arrangements: Spouse/significant other Support Systems: Spouse/significant other Type of Residence: Private residence Insurance Resources: Media planner (specify) ([employer]) Financial Resources: Restaurant manager, fast food Screen Referred: No Living Expenses: Banker Management: Patient, Spouse Does the patient have any problems obtaining your medications?: No Home Management: Wife manahes majority of home care needs; pt helps with preparing his own breakfast, and mopping the floors. Patient/Family Preliminary Plans: TBD Care Coordinator Barriers to Discharge: Decreased caregiver support, Lack of/limited family support, Insurance for SNF coverage Care Coordinator Anticipated Follow Up Needs: HH/OP Expected length of stay: 17-21 days  Clinical Impression SW met with pt and wife in room to introduce self, explain role, discuss discharge process, and inform on ELOS. Pt is not a Cytogeneticist. No HCPOA. DME: RW, and likely a cane.   Chiyo Fay A Leeasia Secrist 01/15/2024, 1:13 PM

## 2024-01-15 NOTE — Progress Notes (Signed)
 Speech Language Pathology Daily Session Note  Patient Details  Name: Tyler David MRN: [id number] Date of Birth: 1946/08/20  Today's Date: 01/15/2024 SLP Individual Time: 1300-1400 SLP Individual Time Calculation (min): 60 min  Short Term Goals: Week 1: SLP Short Term Goal 1 (Week 1): Patient will utilize swallowing compensatory strategies during consumption of regular/thin diet with min multimodal A SLP Short Term Goal 2 (Week 1): Patient will demonstrate problem solving skills in mildly complex situations given min multimodal A  Skilled Therapeutic Interventions: Skilled therapy session focused on cognitive goals. SLP facilitated session by providing minA during medication management activity. SLP prompted patient to complete BID pillbox according to written directions. Patient completed pillbox and corrected mistakes as needed with minA. SLP educated patient on importance of utilizing pillbox at d/c to aid in planning, organization, and memory. Patient in agreeance. At the end of the session, SLP challenged patient by providing minA for patient to identify and correct medication errors. Patient left in chair with alarm set and call bell in reach. Continue POC.  Pain None reported   Therapy/Group: Individual Therapy  Quanah Majka M.A., CCC-SLP 01/15/2024, 7:44 AM

## 2024-01-15 NOTE — Progress Notes (Signed)
 Very restless night. PRN melatonin given at 2057, not effective tonight. Denies pain. Sternal incision CD& I. BLE edema right > left. 1-2 plus pitting edema to RLE. Still waiting on dopplers. Tyler David

## 2024-01-15 NOTE — Progress Notes (Signed)
 VASCULAR LAB    Right lower extremity venous duplex has been performed.  See CV proc for preliminary results.   Irja Wheless, RVT 01/15/2024, 10:06 AM

## 2024-01-15 NOTE — Progress Notes (Signed)
 Physical Therapy Session Note  Patient Details  Name: Tyler David MRN: [id number] Date of Birth: 09-08-1946  Today's Date: 01/15/2024 PT Individual Time: 1050-1200 PT Individual Time Calculation (min): 70 min   Short Term Goals: Week 1:  PT Short Term Goal 1 (Week 1): Pt will roll side to side w/ min A. PT Short Term Goal 2 (Week 1): Pt will transfer sit to stand w/ mod A consistently. PT Short Term Goal 3 (Week 1): Pt will transfer SPT bed <> w/c w/ mod A consistently. PT Short Term Goal 4 (Week 1): PT will assess gait as able.  Skilled Therapeutic Interventions/Progress Updates:    Pt presents in room in bed, agreeable to PT requesting to use restroom. Pt denies pain at this time. Session focused on therapeutic activities to facilitate participation with self care tasks as well as gait training for tolerance to upright.  Therapist dons TED hose and nonslip socks with pt in supine total assist for time management due to urgency. Pt completes bed mobility with mod assist for BLEs off bed and trunk to upright to maintain cervical precautions. Pt completes sit to stand with min assist to stedy from elevated EOB. Pt transported to bathroom via stedy dependently and pt positioned for toilet transfer, requires min assist to lower to toilet, mod assist for pants management. Pt requires supervision for sitting on toilet, pt able to complete periarea hygiene in sitting with close supervision, pt leaning to L and utilizing RUE for periarea hygiene. Pt stands pulling with RUE with grab bar impulsively despite cues, provided with education on sternal precautions. Pt completes step up onto stedy using RLE with min assist and transferred to sink for hand hygiene, completed in standing, and to WC. Therapist provides mod assist for donning pants and shoes in sitting, stands with mod assist to pull pants over hips.  Pt transported to main gym dependently for time management. Pt completes stand to eva walker  with mod assist. Pt ambualtes with eva walker 95' with min/mod assist for controlling eva walker, same person WC follow, cues for widening BOS and RLE foot clearance with pt demonstrating occasional scissoring and NBOS. Cues for increasing proximity to eva walker. Pt takes extended seated rest break during which therapist educates pt on CLOF and goals of care with pt verbalizing understanding. Pt then completes gait training 53' with eva walker min/mod assist with x2 turns and backwards walking to sit to WC. Pt demonstrating decreased RLE foot clearance with fatigue, increased dififculty with turns and backing up to position for sitting in WC.  Pt returns to room, completes stand step transfer with RW with mod assist for stand, min assist for transfer with mod cues for positioning and attention to task. Pt remains seated in WC with all needs within reach, cal light in place and chair alarm donned and activated at end of session.   Therapy Documentation Precautions:  Precautions Precautions: Fall, Sternal Precaution/Restrictions Comments: R hemi Restrictions Weight Bearing Restrictions Per Provider Order: Yes RUE Weight Bearing Per Provider Order: Weight bearing as tolerated LUE Weight Bearing Per Provider Order: Weight bearing as tolerated Other Position/Activity Restrictions: sternal   Therapy/Group: Individual Therapy  Edwin Cap PT, DPT 01/15/2024, 4:39 PM

## 2024-01-15 NOTE — Progress Notes (Signed)
 Inpatient Rehabilitation  Patient information reviewed and entered into eRehab system by Cheri Rous, OTR/L, Rehab Quality Coordinator.   Information including medical coding, functional ability and quality indicators will be reviewed and updated through discharge.

## 2024-01-15 NOTE — Progress Notes (Signed)
 Occupational Therapy Session Note  Patient Details  Name: Tyler David MRN: [id number] Date of Birth: 1946/03/03  Today's Date: 01/15/2024 OT Individual Time: 4098-1191 OT Individual Time Calculation (min): 45 min   Today's Date: 01/15/2024 OT Missed Time: 30 Minutes Missed Time Reason: Patient fatigue  Short Term Goals: Week 1:  OT Short Term Goal 1 (Week 1): Pt will perform toilet transfers with consistent Min A + LRAD. OT Short Term Goal 2 (Week 1): Pt will thread LB garments with supervision + LRAD. OT Short Term Goal 3 (Week 1): Pt will tolerate therapuetic activities >2 mins with decreased SOB.  Skilled Therapeutic Interventions/Progress Updates:   Session 1: Pt received sitting in WC, no complaints of pain, but reports minimal sleep overnight. Pt performs initial sit>stand with x2 trials using heart pillow to minimize pushing up through UE, Mod A level. Pt takes ~5 steps forward with Min A + RW, cuing to "kick" RLE forwards. Sink-side grooming with distant supervision. Pt dependently transported from room<>day room. Pt performs stand-step transfer from WC<>EOM with Min A for RLE management + RW. At [facility], block-practiced sit<>stands from varying mat heights, patient able to progress to Min-light Mod A, OT providing approximation at R-knee. Stand-step transfer from WC>recliner with Mod-Max A for sit>stand, Min A + RW for remainder of transfer. Multimodal cuing required for sequencing. Pt remained sitting in recliner, all immediate needs met.    Session 2:   OT attempting to see this patient for AM missed minutes. Pt received with spouse at bedside, resting soundly, plan to make missed minutes as schedule permits.   Therapy Documentation Precautions:  Precautions Precautions: Fall, Sternal Precaution/Restrictions Comments: R hemi Restrictions Weight Bearing Restrictions Per Provider Order: Yes RUE Weight Bearing Per Provider Order: Weight bearing as tolerated LUE Weight  Bearing Per Provider Order: Weight bearing as tolerated Other Position/Activity Restrictions: sternal  Therapy/Group: Individual Therapy  Lou Cal, OTR/L, MSOT  01/15/2024, 6:17 AM

## 2024-01-15 NOTE — IPOC Note (Signed)
 Overall Plan of Care [facility]) Patient Details Name: Tyler David MRN: [id number] DOB: Nov 01, 1945  Admitting Diagnosis: Cardioembolic stroke [facility])  Hospital Problems: Principal Problem:   Cardioembolic stroke [facility])     Functional Problem List: Nursing Bladder, Endurance, Medication Management, Pain, Skin Integrity  PT Balance, Safety, Endurance, Motor  OT Balance, Cognition, Edema, Endurance, Motor, Pain, Perception, Safety, Sensory, Skin Integrity  SLP Nutrition, Cognition  TR         Basic ADL's: OT Bathing, Dressing, Toileting     Advanced  ADL's: OT       Transfers: PT Bed Mobility, Bed to Chair, Car, Occupational psychologist, Research scientist (life sciences): PT Ambulation, Stairs     Additional Impairments: OT Fuctional Use of Upper Extremity  SLP Swallowing, Social Cognition   Problem Solving  TR      Anticipated Outcomes Item Anticipated Outcome  Self Feeding    Swallowing  mod i   Basic self-care  Mod I  Toileting  Mod I   Bathroom Transfers Mod I  Bowel/Bladder  manage bladder with time toileting/ continent of bowels  Transfers  CGA  Locomotion  CGA  Communication     Cognition  mod i  Pain  <4 w/ prns  Safety/Judgment  manage safety minimal assistance   Therapy Plan: PT Intensity: Minimum of 1-2 x/day ,45 to 90 minutes PT Frequency: 5 out of 7 days PT Duration Estimated Length of Stay: 2.5-3 weeks. OT Intensity: Minimum of 1-2 x/day, 45 to 90 minutes OT Frequency: 5 out of 7 days OT Duration/Estimated Length of Stay: 17-21 days SLP Intensity: Minumum of 1-2 x/day, 30 to 90 minutes SLP Frequency: 1 to 3 out of 7 days SLP Duration/Estimated Length of Stay: 18-21 days   Team Interventions: Nursing Interventions Patient/Family Education, Medication Management, Bladder Management, Disease Management/Prevention, Pain Management, Discharge Planning, Skin Care/Wound Management  PT interventions Ambulation/gait training, Community reintegration,  Neuromuscular re-education, Stair training, Psychosocial support, UE/LE Strength taining/ROM, Warden/ranger, Discharge planning, Therapeutic Activities, UE/LE Coordination activities, Therapeutic Exercise, Splinting/orthotics, Patient/family education, Functional mobility training  OT Interventions Warden/ranger, Cognitive remediation/compensation, Community reintegration, Discharge planning, Disease mangement/prevention, DME/adaptive equipment instruction, Functional electrical stimulation, Functional mobility training, Neuromuscular re-education, Psychosocial support, Patient/family education, Pain management, Self Care/advanced ADL retraining, Splinting/orthotics, UE/LE Strength taining/ROM, Wheelchair propulsion/positioning, Visual/perceptual remediation/compensation, Skin care/wound managment, Therapeutic Exercise, Therapeutic Activities, UE/LE Coordination activities  SLP Interventions Cognitive remediation/compensation, Dysphagia/aspiration precaution training, Internal/external aids, Cueing hierarchy, Therapeutic Activities, Functional tasks, Patient/family education  TR Interventions    SW/CM Interventions Patient/Family Education, Psychosocial Support, Discharge Planning   Barriers to Discharge MD  Medical stability  Nursing Decreased caregiver support, Home environment access/layout Discharge: House  Discharge Home Layout: One level  Discharge Home Access: Level entry  PT Decreased caregiver support    OT Wound Care, Weight bearing restrictions    SLP      SW Decreased caregiver support, Lack of/limited family support, Community education officer for SNF coverage     Team Discharge Planning: Destination: PT-Home ,OT- Home , SLP-Home Projected Follow-up: PT-Home health PT, OT-  Home health OT, SLP-None Projected Equipment Needs: PT-To be determined, OT- To be determined, SLP-None recommended by SLP Equipment Details: PT-pt states having RW, OT-  Patient/family involved in  discharge planning: PT- Patient, Family member/caregiver,  OT-Patient, Family member/caregiver, SLP-Patient  MD ELOS: 2.5 to 3 weeks Medical Rehab Prognosis:  Excellent Assessment: The patient has been admitted for CIR therapies with the diagnosis of 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 The team will be addressing functional mobility, strength, stamina, balance, safety, adaptive techniques and equipment, self-care, bowel and bladder mgt, patient and caregiver education. Goals have been set at mod I/Sup. Anticipated discharge destination is home.       See Team Conference Notes for weekly updates to the plan of care

## 2024-01-15 NOTE — Progress Notes (Signed)
 PROGRESS NOTE   Subjective/Complaints: Reports continued poor sleep. No specific reason for poor sleep other than not being at home in his own bed.  ROS: Denies fevers, chills, N/V, abdominal pain, constipation, diarrhea, SOB, cough, chest pain, new weakness or paraesthesias.   + Insomnia + Right leg pain + Right > Left lower extremity edema  Objective:   VAS [place] LOWER EXTREMITY VENOUS (DVT) Result Date: 01/15/2024  Lower Venous DVT Study Patient Name:  Tyler David  Date of Exam:   01/15/2024 Medical Rec #: [id number]          Accession #:    [id number] Date of Birth: [birth year]           Patient Gender: M Patient Age:   78 years Exam Location:  [facility] Procedure:      VAS [place] LOWER EXTREMITY VENOUS (DVT) Referring Phys: Elijah Birk --------------------------------------------------------------------------------  Indications: Edema. Other Indications: Recent CABG with right GSV harvest. Anticoagulation: Eliquis for atrial fibrillation.. Comparison Study: No prior study on file Performing Technologist: Sherren Kerns RVS  Examination Guidelines: A complete evaluation includes B-mode imaging, spectral Doppler, color Doppler, and power Doppler as needed of all accessible portions of each vessel. Bilateral testing is considered an integral part of a complete examination. Limited examinations for reoccurring indications may be performed as noted. The reflux portion of the exam is performed with the patient in reverse Trendelenburg.  +---------+---------------+---------+-----------+---------------+-------------+ RIGHT    CompressibilityPhasicitySpontaneityProperties     Thrombus                                                                 Aging         +---------+---------------+---------+-----------+---------------+-------------+ CFV      Full           Yes      No         Pulsatile                                                                 waveform                     +---------+---------------+---------+-----------+---------------+-------------+ SFJ      Full                                                            +---------+---------------+---------+-----------+---------------+-------------+ FV Prox  Full                                                            +---------+---------------+---------+-----------+---------------+-------------+  FV Mid   Full                                                            +---------+---------------+---------+-----------+---------------+-------------+ FV DistalFull                                                            +---------+---------------+---------+-----------+---------------+-------------+ PFV      Full                                                            +---------+---------------+---------+-----------+---------------+-------------+ POP      Full           Yes      No         Pulsatile                                                                waveform                     +---------+---------------+---------+-----------+---------------+-------------+ PTV      Full                                                            +---------+---------------+---------+-----------+---------------+-------------+ PERO     Full                                                            +---------+---------------+---------+-----------+---------------+-------------+ Gastroc  Full                                                            +---------+---------------+---------+-----------+---------------+-------------+   +----+---------------+---------+-----------+------------------+--------------+ LEFTCompressibilityPhasicitySpontaneityProperties        Thrombus Aging +----+---------------+---------+-----------+------------------+--------------+ CFV Full           Yes      No          Pulsatile waveform               +----+---------------+---------+-----------+------------------+--------------+ SFJ Full                                                                +----+---------------+---------+-----------+------------------+--------------+  Summary: RIGHT: - There is no evidence of deep vein thrombosis in the lower extremity.  - No cystic structure found in the popliteal fossa.  LEFT: - No evidence of common femoral vein obstruction.   *See table(s) above for measurements and observations. Electronically signed by Sherald Hess MD on 01/15/2024 at 12:29:32 PM.    Final    Recent Labs    01/13/24 1050 01/15/24 0603  WBC 12.2* 9.2  HGB 9.5* 9.6*  HCT 29.5* 29.7*  PLT 345 384   Recent Labs    01/13/24 1050 01/15/24 0603  NA 140 141  K 4.4 4.5  CL 111 109  CO2 20* 23  GLUCOSE 95 101*  BUN 40* 30*  CREATININE 2.08* 2.22*  CALCIUM 7.9* 8.3*    Intake/Output Summary (Last 24 hours) at 01/15/2024 1757 Last data filed at 01/15/2024 1638 Gross per 24 hour  Intake 480 ml  Output 1275 ml  Net -795 ml        Physical Exam: Vital Signs Blood pressure 133/61, pulse 69, temperature 98.2 F (36.8 C), resp. rate 16, height 5\' 8"  (1.727 m), weight 87 kg, SpO2 95%. Constitutional: No apparent distress. Appropriate appearance for age.  Laying in bed HENT: No JVD. Neck Supple. Trachea midline. Atraumatic, normocephalic. Eyes: PERRLA. EOMI. Visual fields grossly intact.  Cardiovascular: RRR, no murmurs/rub/gallops.  1+ right lower extremity edema, trace left lower extremity edema  Respiratory: CTAB. No rales, rhonchi, or wheezing. On RA.  Abdomen: + bowel sounds, normoactive. No distention or tenderness.   Skin: Vein harvesting site from right lower extremity appears clean, dry, well-approximated.  No surrounding erythema or warmth.  MSK:      No apparent deformity.   Neurologic exam:  Awake, alert, oriented x 3. Some mild cognitive delay and  memory issues No dysarthria, language intact Cranial nerves II through XII grossly intact Strength: RUE 4+/5. LUE 3 to 3+/5 prox to 4- at hand and wrist. RLE 2/5 HF, KE and trace to absent at ankle. LLE 3-4/5 prox to distal.  Light touch: Intact in all 4 extremities Reflexes: DTR's brisk and 2+ in all 4's. Toes up right foot.    No significant changes 3-31   Assessment/Plan: 1. Functional deficits which require 3+ hours per day of interdisciplinary therapy in a comprehensive inpatient rehab setting. Physiatrist is providing close team supervision and 24 hour management of active medical problems listed below. Physiatrist and rehab team continue to assess barriers to discharge/monitor patient progress toward functional and medical goals  Care Tool:  Bathing    Body parts bathed by patient: Right arm, Left arm, Chest, Abdomen, Front perineal area, Face, Right upper leg, Left upper leg   Body parts bathed by helper: Buttocks, Right lower leg, Left lower leg     Bathing assist Assist Level: Moderate Assistance - Patient 50 - 74%     Upper Body Dressing/Undressing Upper body dressing   What is the patient wearing?: Pull over shirt    Upper body assist Assist Level: Set up assist    Lower Body Dressing/Undressing Lower body dressing      What is the patient wearing?: Underwear/pull up, Pants     Lower body assist Assist for lower body dressing: Maximal Assistance - Patient 25 - 49%     Toileting Toileting    Toileting assist Assist for toileting: Maximal Assistance - Patient 25 - 49%     Transfers Chair/bed transfer  Transfers assist     Chair/bed transfer assist level: Maximal  Assistance - Patient 25 - 49%     Locomotion Ambulation   Ambulation assist   Ambulation activity did not occur: Safety/medical concerns          Walk 10 feet activity   Assist  Walk 10 feet activity did not occur: Safety/medical concerns        Walk 50 feet  activity   Assist Walk 50 feet with 2 turns activity did not occur: Safety/medical concerns         Walk 150 feet activity   Assist Walk 150 feet activity did not occur: Safety/medical concerns         Walk 10 feet on uneven surface  activity   Assist Walk 10 feet on uneven surfaces activity did not occur: Safety/medical concerns         Wheelchair     Assist Is the patient using a wheelchair?: Yes Type of Wheelchair: Manual    Wheelchair assist level: Dependent - Patient 0%      Wheelchair 50 feet with 2 turns activity    Assist        Assist Level: Dependent - Patient 0%   Wheelchair 150 feet activity     Assist      Assist Level: Dependent - Patient 0%   Blood pressure 133/61, pulse 69, temperature 98.2 F (36.8 C), resp. rate 16, height 5\' 8"  (1.727 m), weight 87 kg, SpO2 95%.  Medical Problem List and Plan: 1. 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  Sternal precautions             -patient may  shower             -ELOS/Goals: 18-21 days, goals supervision to min assist with PT and OT and mod I to supervision with SLP   - Continue CIR, team conference tomorrow  2.  Antithrombotics: -DVT/anticoagulation:  Pharmaceutical: Eliquis             -antiplatelet therapy: Aspirin 81 mg daily  -3-30: Bilateral lower extremity duplex ordered for right lower extremity increased edema, pain.  Negative for DVT right lower extremity  3. Pain Management: Oxycodone as needed 4. Mood/Behavior/Sleep: Provide emotional support             -antipsychotic agents: N/A             -pt with persistent fatigue             -record sleep chart             -melatonin prn insomnia  3-29: Patient refused as needed melatonin overnight; encouraged use as needed  3-30: Slept much better with melatonin  3-31 increase melatonin to 5 mg  5. Neuropsych/cognition: This patient is capable  of making decisions on his own behalf. 6. Skin/Wound Care: Routine skin checks 7. Fluids/Electrolytes/Nutrition: Routine in and outs with follow-up chemistries on Monday             -encourage PO 8.  Postoperative atrial fibrillation.  Amiodarone 400 mg twice daily as well as Lopressor 12.5 mg twice daily.  Will discuss plan to decrease dosage  -Regular rhythm.  9.  Acute blood loss anemia.  Latest hemoglobin 9.8.  Follow-up CBC  -3-4 9: Admission hemoglobin 9.5; stable  10.  Hypertension/HFrEF.  Norvasc 10 mg daily. Lopressor 12.5 m BID.  Monitor with increased mobility  -01-12-29: Moderately hypertensive; some permission given recent CVA.  Monitor for now.  3-30: Remains hypertensive,  increasing peripheral edema, discharge meds include Lasix 40 mg daily--held on rehab admission.  Will start daily weights and resume Lasix at 20 mg daily. Last BNP per chart review 570; will add to AM labs.  -3/31 weights overall stable, intermittently hypertensive. Continue lasix, BNP up to 1263      01/15/2024    2:59 PM 01/15/2024    4:00 AM 01/15/2024    3:58 AM  Vitals with BMI  Weight  191 lbs 13 oz   BMI  29.17   Systolic 133  156  Diastolic 61  70  Pulse 69  72   Filed Weights   01/12/24 1400 01/15/24 0400  Weight: 87.3 kg 87 kg    11.  Hyperlipidemia.  Zetia 12.  Hypothyroidism.  Synthroid 13.  CKD stage III.  Creatinine baseline 1.98-2.41             -f/u labs Monday   3-29: Admission labs stable, creatinine down to 2.08  3/31 creatinine a little higher at 2.22  14.  GERD.  Protonix 15.  Leukocytosis.  Mild, 12.2 today from 10.5 prior.  Monitor for systemic signs of illness and repeat labs Monday.  -3-30: No fevers, no concerning signs of infection.  Will check duplex in case of right lower extremity DVT due to increasing edema in this leg--likely just from vein harvesting  3/31 WBC down to 9.2  LOS: 3 days A FACE TO FACE EVALUATION WAS PERFORMED  Fanny Dance 01/15/2024, 5:57  PM

## 2024-01-15 NOTE — Progress Notes (Signed)
 Patient ID: Tyler David, male   DOB: [birth year], 78 y.o.   MRN: [id number]  [phone]- SW spoke with pt wife Dois Davenport to introduce self, explain role, discuss discharge process, and inform ELOS. Dtr is currently here to help, and son lives across the street. Pt wife reports she works PT 3 days per week 8am-5pm. Reports she can still be available if needed. She is aware SW will follow-up with updates after team conference.  Cecile Sheerer, MSW, LCSW Office: [phone] Cell: [phone] Fax: [phone]

## 2024-01-15 NOTE — Plan of Care (Signed)
  Problem: Consults Goal: RH STROKE PATIENT EDUCATION Description: See Patient Education module for education specifics  Outcome: Progressing   Problem: RH BOWEL ELIMINATION Goal: RH STG MANAGE BOWEL WITH ASSISTANCE Description: STG Manage Bowel with minimal  Assistance. Outcome: Progressing   Problem: RH BLADDER ELIMINATION Goal: RH STG MANAGE BLADDER WITH ASSISTANCE Description:  Manage Bladder with minimal   Assistance Outcome: Progressing   Problem: RH SKIN INTEGRITY Goal: RH STG SKIN FREE OF INFECTION/BREAKDOWN Description: Manage skin free of infection with minimal assistance Outcome: Progressing   Problem: RH SAFETY Goal: RH STG ADHERE TO SAFETY PRECAUTIONS W/ASSISTANCE/DEVICE Description: STG Adhere to Safety Precautions With minimal Assistance/Device. Outcome: Progressing   Problem: RH COGNITION-NURSING Goal: RH STG USES MEMORY AIDS/STRATEGIES W/ASSIST TO PROBLEM SOLVE Description: STG Uses Memory Aids/Strategies With minimal Assistance to Problem Solve. Outcome: Progressing   Problem: RH PAIN MANAGEMENT Goal: RH STG PAIN MANAGED AT OR BELOW PT'S PAIN GOAL Description: <4 w/  prns Outcome: Progressing   Problem: RH KNOWLEDGE DEFICIT Goal: RH STG INCREASE KNOWLEDGE OF DIABETES Description:  Manage increase  knowledge of diabetes with minimal assistance using educational materials provided  Outcome: Progressing Goal: RH STG INCREASE KNOWLEDGE OF HYPERTENSION Description: Manage increase  knowledge of hypertension with minimal assistance using educational materials provided  Outcome: Progressing Goal: RH STG INCREASE KNOWLEGDE OF HYPERLIPIDEMIA Description: Manage increase  knowledge of hyperlipidemia  with minimal assistance using educational materials provided  Outcome: Progressing Goal: RH STG INCREASE KNOWLEDGE OF STROKE PROPHYLAXIS Description: Manage increase  knowledge of stroke prophylaxis  with minimal assistance using educational materials provided   Outcome: Progressing

## 2024-01-16 MED ORDER — FUROSEMIDE 40 MG PO TABS
40.0000 mg | ORAL_TABLET | Freq: Every day | ORAL | Status: DC
  Administered 2024-01-17 – 2024-01-20 (×4): 40 mg via ORAL
  Filled 2024-01-16 (×4): qty 1

## 2024-01-16 NOTE — Progress Notes (Signed)
 Physical Therapy Session Note  Patient Details  Name: TRESTON COKER MRN: [id number] Date of Birth: 03-20-1946  Today's Date: 01/16/2024 PT Individual Time: 1350-1430 PT Individual Time Calculation (min): 40 min   Short Term Goals: Week 1:  PT Short Term Goal 1 (Week 1): Pt will roll side to side w/ min A. PT Short Term Goal 2 (Week 1): Pt will transfer sit to stand w/ mod A consistently. PT Short Term Goal 3 (Week 1): Pt will transfer SPT bed <> w/c w/ mod A consistently. PT Short Term Goal 4 (Week 1): PT will assess gait as able.  Skilled Therapeutic Interventions/Progress Updates:      Therapy Documentation Precautions:  Precautions Precautions: Fall, Sternal Precaution/Restrictions Comments: R hemi Restrictions Weight Bearing Restrictions Per Provider Order: Yes RUE Weight Bearing Per Provider Order: Weight bearing as tolerated LUE Weight Bearing Per Provider Order: Weight bearing as tolerated Other Position/Activity Restrictions: sternal  Pt agreeable to PT and without reports of pain. Session with emphasis on transfer and gait training to address strength and activity tolerance deficits. Min/mod A with sit<>stand with mod cues for hand placement to adhere sternal precautions along with anterior flexion to prevent posterior loss of balance. Pt CGA/min A with gait 98', 50', 50', and 64' with RW with cues for "knee up" to facilitate increased right hip flexion to promote swing. Pt left seated in recliner at bedside with all needs in reach and alarm on.     Therapy/Group: Individual Therapy  Truitt Leep Truitt Leep PT, DPT  01/16/2024, 3:53 PM

## 2024-01-16 NOTE — Progress Notes (Signed)
 PROGRESS NOTE   Subjective/Complaints:  No acute complaints.  No events overnight. Intermittent mild hypertension 149/65 this a.m.; otherwise vitals are well-controlled Continent of bowel bladder, last bowel movement 3-31, large Per sleep log, patient only slept intermittently overnight, patient reports much better sleep overall and no needs at this time.  Right leg pain has much improved.  ROS: Denies fevers, chills, N/V, abdominal pain, constipation, diarrhea, SOB, cough, chest pain, new weakness or paraesthesias.   + Insomnia--improved + Right leg pain--improved + Right > Left lower extremity edema--improved  Objective:   VAS [place] LOWER EXTREMITY VENOUS (DVT) Result Date: 01/15/2024  Lower Venous DVT Study Patient Name:  Tyler David  Date of Exam:   01/15/2024 Medical Rec #: [id number]          Accession #:    [id number] Date of Birth: Jun 06, 1946           Patient Gender: M Patient Age:   78 years Exam Location:  [facility] Procedure:      VAS [place] LOWER EXTREMITY VENOUS (DVT) Referring Phys: Elijah Birk --------------------------------------------------------------------------------  Indications: Edema. Other Indications: Recent CABG with right GSV harvest. Anticoagulation: Eliquis for atrial fibrillation.. Comparison Study: No prior study on file Performing Technologist: Sherren Kerns RVS  Examination Guidelines: A complete evaluation includes B-mode imaging, spectral Doppler, color Doppler, and power Doppler as needed of all accessible portions of each vessel. Bilateral testing is considered an integral part of a complete examination. Limited examinations for reoccurring indications may be performed as noted. The reflux portion of the exam is performed with the patient in reverse Trendelenburg.  +---------+---------------+---------+-----------+---------------+-------------+ RIGHT     CompressibilityPhasicitySpontaneityProperties     Thrombus                                                                 Aging         +---------+---------------+---------+-----------+---------------+-------------+ CFV      Full           Yes      No         Pulsatile                                                                waveform                     +---------+---------------+---------+-----------+---------------+-------------+ SFJ      Full                                                            +---------+---------------+---------+-----------+---------------+-------------+  FV Prox  Full                                                            +---------+---------------+---------+-----------+---------------+-------------+ FV Mid   Full                                                            +---------+---------------+---------+-----------+---------------+-------------+ FV DistalFull                                                            +---------+---------------+---------+-----------+---------------+-------------+ PFV      Full                                                            +---------+---------------+---------+-----------+---------------+-------------+ POP      Full           Yes      No         Pulsatile                                                                waveform                     +---------+---------------+---------+-----------+---------------+-------------+ PTV      Full                                                            +---------+---------------+---------+-----------+---------------+-------------+ PERO     Full                                                            +---------+---------------+---------+-----------+---------------+-------------+ Gastroc  Full                                                             +---------+---------------+---------+-----------+---------------+-------------+   +----+---------------+---------+-----------+------------------+--------------+ LEFTCompressibilityPhasicitySpontaneityProperties        Thrombus Aging +----+---------------+---------+-----------+------------------+--------------+ CFV Full           Yes  No         Pulsatile waveform               +----+---------------+---------+-----------+------------------+--------------+ SFJ Full                                                                +----+---------------+---------+-----------+------------------+--------------+     Summary: RIGHT: - There is no evidence of deep vein thrombosis in the lower extremity.  - No cystic structure found in the popliteal fossa.  LEFT: - No evidence of common femoral vein obstruction.   *See table(s) above for measurements and observations. Electronically signed by Sherald Hess MD on 01/15/2024 at 12:29:32 PM.    Final    Recent Labs    01/13/24 1050 01/15/24 0603  WBC 12.2* 9.2  HGB 9.5* 9.6*  HCT 29.5* 29.7*  PLT 345 384   Recent Labs    01/13/24 1050 01/15/24 0603  NA 140 141  K 4.4 4.5  CL 111 109  CO2 20* 23  GLUCOSE 95 101*  BUN 40* 30*  CREATININE 2.08* 2.22*  CALCIUM 7.9* 8.3*    Intake/Output Summary (Last 24 hours) at 01/16/2024 0910 Last data filed at 01/16/2024 0654 Gross per 24 hour  Intake 358 ml  Output 1125 ml  Net -767 ml        Physical Exam: Vital Signs Blood pressure (!) 149/65, pulse 66, temperature 98.4 F (36.9 C), temperature source Oral, resp. rate 16, height 5\' 8"  (1.727 m), weight 87 kg, SpO2 96%. Constitutional: No apparent distress. Appropriate appearance for age.  Laying in bed HENT: No JVD. Neck Supple. Trachea midline. Atraumatic, normocephalic. Eyes: PERRLA. EOMI. Visual fields grossly intact.  Cardiovascular: RRR, no murmurs/rub/gallops.  1+ right lower extremity edema, trace left lower extremity  edema--unchanged, in TED hose  Respiratory: CTAB. No rales, rhonchi, or wheezing. On RA.  Abdomen: + bowel sounds, normoactive. No distention or tenderness.   Skin: Vein harvesting site from right lower extremity appears clean, dry, well-approximated.  No surrounding erythema or warmth.  MSK:      No apparent deformity.   Neurologic exam:  Awake, alert, oriented x 3. Some mild cognitive delay and memory issues No dysarthria, language intact Cranial nerves II through XII grossly intact Strength: RUE 4+/5. LUE 3 to 3+/5 prox to 4- at hand and wrist. RLE 2/5 HF, KE and trace to absent at ankle. LLE 3-4/5 prox to distal.  Light touch: Intact in all 4 extremities Reflexes: DTR's brisk and 2+ in all 4's. Toes up right foot.    No significant changes 4-1   Assessment/Plan: 1. Functional deficits which require 3+ hours per day of interdisciplinary therapy in a comprehensive inpatient rehab setting. Physiatrist is providing close team supervision and 24 hour management of active medical problems listed below. Physiatrist and rehab team continue to assess barriers to discharge/monitor patient progress toward functional and medical goals  Care Tool:  Bathing    Body parts bathed by patient: Right arm, Left arm, Chest, Abdomen, Front perineal area, Face, Right upper leg, Left upper leg   Body parts bathed by helper: Buttocks, Right lower leg, Left lower leg     Bathing assist Assist Level: Moderate Assistance - Patient 50 - 74%     Upper Body Dressing/Undressing Upper body  dressing   What is the patient wearing?: Pull over shirt    Upper body assist Assist Level: Set up assist    Lower Body Dressing/Undressing Lower body dressing      What is the patient wearing?: Underwear/pull up, Pants     Lower body assist Assist for lower body dressing: Maximal Assistance - Patient 25 - 49%     Toileting Toileting    Toileting assist Assist for toileting: Maximal Assistance -  Patient 25 - 49%     Transfers Chair/bed transfer  Transfers assist     Chair/bed transfer assist level: Maximal Assistance - Patient 25 - 49%     Locomotion Ambulation   Ambulation assist   Ambulation activity did not occur: Safety/medical concerns          Walk 10 feet activity   Assist  Walk 10 feet activity did not occur: Safety/medical concerns        Walk 50 feet activity   Assist Walk 50 feet with 2 turns activity did not occur: Safety/medical concerns         Walk 150 feet activity   Assist Walk 150 feet activity did not occur: Safety/medical concerns         Walk 10 feet on uneven surface  activity   Assist Walk 10 feet on uneven surfaces activity did not occur: Safety/medical concerns         Wheelchair     Assist Is the patient using a wheelchair?: Yes Type of Wheelchair: Manual    Wheelchair assist level: Dependent - Patient 0%      Wheelchair 50 feet with 2 turns activity    Assist        Assist Level: Dependent - Patient 0%   Wheelchair 150 feet activity     Assist      Assist Level: Dependent - Patient 0%   Blood pressure (!) 149/65, pulse 66, temperature 98.4 F (36.9 C), temperature source Oral, resp. rate 16, height 5\' 8"  (1.727 m), weight 87 kg, SpO2 96%.  Medical Problem List and Plan: 1. 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  Sternal precautions             -patient may  shower             -ELOS/Goals: 18-21 days, goals supervision to min assist with PT and OT and mod I to supervision with SLP - 4/10 DC date   - Continue CIR, team conference tomorrow  4/1: Mod-Max LB ADLs, limited by weakness. Needs BSC and tub transfer bench. Fatigue improving; Mod A bed mobnility and transfers, walks 60 ft RW with CGA/Min A. May meet SPV goals. Has some decreased insight and c/b sternal precautions requiring frequent cues.  Targetting complex tasks for SLP.  2.  Antithrombotics: -DVT/anticoagulation:  Pharmaceutical: Eliquis             -antiplatelet therapy: Aspirin 81 mg daily  -3-30: Bilateral lower extremity duplex ordered for right lower extremity increased edema, pain.  Negative for DVT right lower extremity  3. Pain Management: Oxycodone as needed 4. Mood/Behavior/Sleep: Provide emotional support             -antipsychotic agents: N/A             -pt with persistent fatigue             -record sleep chart             -  melatonin prn insomnia  3-29: Patient refused as needed melatonin overnight; encouraged use as needed  3-30: Slept much better with melatonin  3-31 increase melatonin to 5 mg--with benefit per patient  5. Neuropsych/cognition: This patient is capable of making decisions on his own behalf. 6. Skin/Wound Care: Routine skin checks 7. Fluids/Electrolytes/Nutrition: Routine in and outs with follow-up chemistries on Monday             -encourage PO 8.  Postoperative atrial fibrillation.  Amiodarone 400 mg twice daily as well as Lopressor 12.5 mg twice daily.  Will discuss plan to decrease dosage  -Regular rhythm.  9.  Acute blood loss anemia.  Latest hemoglobin 9.8.  Follow-up CBC  -3-4 9: Admission hemoglobin 9.5; stable  10.  Hypertension/HFrEF.  Norvasc 10 mg daily. Lopressor 12.5 m BID.  Monitor with increased mobility  -01-12-29: Moderately hypertensive; some permission given recent CVA.  Monitor for now.  3-30: Remains hypertensive, increasing peripheral edema, discharge meds include Lasix 40 mg daily--held on rehab admission.  Will start daily weights and resume Lasix at 20 mg daily. Last BNP per chart review 570; will add to AM labs.  -3/31 weights overall stable, intermittently hypertensive. Continue lasix, BNP up to 1263  1/4: Blood pressure is improving, weight downtrending since resumption of Lasix--increase back to home dose of 40 mg daily.      01/16/2024    5:23 AM 01/15/2024     7:53 PM 01/15/2024    2:59 PM  Vitals with BMI  Systolic 149 167 865  Diastolic 65 65 61  Pulse 66 73 69   Filed Weights   01/12/24 1400 01/15/24 0400  Weight: 87.3 kg 87 kg    11.  Hyperlipidemia.  Zetia 12.  Hypothyroidism.  Synthroid 13.  CKD stage III.  Creatinine baseline 1.98-2.41             -f/u labs Monday   3-29: Admission labs stable, creatinine down to 2.08  3/31 creatinine a little higher at 2.22  14.  GERD.  Protonix  15.  Leukocytosis.  Mild, 12.2 today from 10.5 prior.  Monitor for systemic signs of illness and repeat labs Monday.  -3-30: No fevers, no concerning signs of infection.  Will check duplex in case of right lower extremity DVT due to increasing edema in this leg--likely just from vein harvesting  3/31 WBC down to 9.2  LOS: 4 days A FACE TO FACE EVALUATION WAS PERFORMED  Angelina Sheriff 01/16/2024, 9:10 AM

## 2024-01-16 NOTE — Progress Notes (Signed)
 Physical Therapy Session Note  Patient Details  Name: Tyler David MRN: [id number] Date of Birth: July 10, 1946  Today's Date: 01/16/2024 PT Individual Time: 0901-0958 PT Individual Time Calculation (min): 57 min   Short Term Goals: Week 1:  PT Short Term Goal 1 (Week 1): Pt will roll side to side w/ min A. PT Short Term Goal 2 (Week 1): Pt will transfer sit to stand w/ mod A consistently. PT Short Term Goal 3 (Week 1): Pt will transfer SPT bed <> w/c w/ mod A consistently. PT Short Term Goal 4 (Week 1): PT will assess gait as able.  Skilled Therapeutic Interventions/Progress Updates:    Pt presents in room in recliner, pt denies pain agreeable to PT. Session focused on gait training, transfer training, and NMR for RLE muscle fiber recruitment and dynamic standing balance.  Therapist dons bilateral TED hose with total assist to address swelling in BLEs, max assist for donning shoes. Pt completes sit<>stand from recliner with mod assist to RW, completes stand step transfer CGA to WC. Pt transported dependently to day room for time management and energy conservation.  Pt completes ambulatory transfer to EOM with RW CGA/light min assist, demonstrating improved RLE foot clearance, cues for positioning prior to sitting to promote backwards stepping. Pt completes all sit<>stands from elevated EOM with CGA maintaining sternal precautions with light touch from BUEs on RW.  Pt completes forward/backward walking with RW 3x10' with pt requiring increased time to complete backwards walking, decreased ability to complete hip extension from neutral with RLE, completes step to gait leading with LLE for backwards stepping and completing hip extension from flexed position with RLE.  Pt then completes 2x60' with RW CGA/min assist, cues for increasing proximity to RW, improving RLE foot clearance but demonstrating  Pt completes NMR for RLE muscle fiber recruitment and dynamic standing balance with BUE support  on RW including: - step taps 4" 2x10  BLE - hamstring curl x10 BLE (active assist from therapist for RLE)  Pt completes stand step transfer with min assist back to [facility] with cues for positioning prior to sitting. Pt returns to room dependently via RW and remains seated in [facility] with all needs within reach, cal light in place and chair alarm activated at end of session.   Therapy Documentation Precautions:  Precautions Precautions: Fall, Sternal Precaution/Restrictions Comments: R hemi Restrictions Weight Bearing Restrictions Per Provider Order: Yes RUE Weight Bearing Per Provider Order: Weight bearing as tolerated LUE Weight Bearing Per Provider Order: Weight bearing as tolerated Other Position/Activity Restrictions: sternal    Therapy/Group: Individual Therapy  Edwin Cap PT, DPT 01/16/2024, 10:35 AM

## 2024-01-16 NOTE — Patient Care Conference (Signed)
 Inpatient RehabilitationTeam Conference and Plan of Care Update Date: 01/16/2024   Time: 1035 am    Patient Name: Tyler David      Medical Record Number: [id number]  Date of Birth: April 19, 1946 Sex: Male         Room/Bed: 4W12C/4W12C-02 Payor Info: Payor: [employer] / Plan: [employer] PPO / Product Type: *No Product type* /    Admit Date/Time:  01/12/2024  2:17 PM  Primary Diagnosis:  Cardioembolic stroke [facility])  Hospital Problems: Principal Problem:   Cardioembolic stroke [facility])    Expected Discharge Date: Expected Discharge Date: 01/25/24  Team Members Present: Physician leading conference: Dr. Elijah Birk Social Worker Present: Cecile Sheerer, LCSWA Nurse Present: Konrad Dolores, RN PT Present: Darrold Span, PT OT Present: Jake Shark, OT SLP Present: Other (comment) Alvera Novel SLP) PPS Coordinator present : Fae Pippin, SLP     Current Status/Progress Goal Weekly Team Focus  Bowel/Bladder   pt continent of b/b   Remain continent   Assist with toileting qshift and prn    Swallow/Nutrition/ Hydration   regular/thin   mod i  tolerance    ADL's   Setup/supervision for UB ADLs, Mod-Max A for LB ADLs & toileting. Barriers: Difficulty with precaution adherence, sit<>stand transfers, RLE weakness.   Mod I; will adjust as appropriate however high-level PTA. Will need BSC & TTB   Functional transfers, precaution adherence, LB AE as appropriate, general conditoning, energy conservation.    Mobility   mod assist bed mobility, modA transfers with RW, gait with eva walker 95' modA   CGA, may upgrade depending on progress  barriers: decreased insight into deficits, sternal precautions, endurance; focus on RLE NMR, gait training, transfer training    Communication   supervision A   modi   higher level cognitive tasks    Safety/Cognition/ Behavioral Observations               Pain   No c/o pain   Remain pain free   Assess qshift  and prn    Skin   Sternal incision, ota with skin glue   Promote healing  Assess qshift and prn      Discharge Planning:  Pt will discharge ot home with support from wife, and support from dtr and son; wife works PT from home 8am-5pm 3days per week. Does not appear to be an issues iwth providing care per her reports. SW will confirm there are no barriers to discharge.   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 Patient has blood pressure fluctuations: medications adjusted by MD.  Patient limited by sternal precautions, poor endurance, fatigue, poor sleep and  right leg weakness.   Patient on target to meet rehab goals: yes, Patient requires Setup/supervision for UB ADLs. Patient requires  Mod-Max A for LB ADLs & toileting.  Patient  requires  mod A with  transfers using a RW and able to ambulate up to  95' with moderate assistance using an eva walker. Overall goals are set for Mod I at discharge.  *See Care Plan and progress notes for long and short-term goals.   Revisions to Treatment Plan:  N/a   Teaching Needs: Safety, medications, toileting transfers, dietary modifications, etc.   Current Barriers to Discharge: Decreased caregiver support and Sternal precautions  Possible Resolutions to Barriers: Family education DME: BSC,TTB     Medical Summary Current Status: Medically complicated by heart failure with recent CABG, insomnia, hypertension, right lower  extremity pain, and mild leukocytosis  Barriers to Discharge: Cardiac Complications;Medical stability;Self-care education;Uncontrolled Hypertension;Uncontrolled Pain;Volume Overload   Possible Resolutions to [employer]: Titration of diuretics to reduce volume overload, monitor for hypotension while on increased dose of amiodarone, minimal tolerated pain medications for right lower extremity pain, melatonin and monitoring for insomnia, wound  care monitoring   Continued Need for Acute Rehabilitation Level of Care: The patient requires daily medical management by a physician with specialized training in physical medicine and rehabilitation for the following reasons: Direction of a multidisciplinary physical rehabilitation program to maximize functional independence : Yes Medical management of patient stability for increased activity during participation in an intensive rehabilitation regime.: Yes Analysis of laboratory values and/or radiology reports with any subsequent need for medication adjustment and/or medical intervention. : Yes   I attest that I was present, lead the team conference, and concur with the assessment and plan of the team.   Gwenyth Allegra 01/16/2024, 7:48 PM

## 2024-01-16 NOTE — Progress Notes (Signed)
 Speech Language Pathology Daily Session Note  Patient Details  Name: MERCER PEIFER MRN: [id number] Date of Birth: [birth year]  Today's Date: 01/16/2024 SLP Individual Time: 3664-4034 SLP Individual Time Calculation (min): 45 min  Short Term Goals: Week 1: SLP Short Term Goal 1 (Week 1): Patient will utilize swallowing compensatory strategies during consumption of regular/thin diet with min multimodal A SLP Short Term Goal 2 (Week 1): Patient will demonstrate problem solving skills in mildly complex situations given min multimodal A  Skilled Therapeutic Interventions: Skilled therapy session focused on cognitive goals. SLP facilitated session by providing supervision A for money management task. SLP prompted patient to count change according to verbalized amount. SLP then challenged patient through functional addition/subtraction task. Patient required supervision A to slow down and check his work for accuracy. Patient left in chair with alarm set and call bell in reach. Continue POC.  Pain None reported  Therapy/Group: Individual Therapy  Delesia Martinek M.A., CCC-SLP 01/16/2024, 7:44 AM

## 2024-01-16 NOTE — Progress Notes (Signed)
 Patient ID: Tyler David, male   DOB: January 08, 1946, 78 y.o.   MRN: [id number]  SW met with pt and pt wife in room to provide updates from team conference, and d/c date 4/10. Pt wife reports there is access to a shower seat. Will confirm DME- BSC and TTB.  Fam edu on Tuesday 9am-12pmCecile Sheerer, MSW, LCSW Office: [phone] Cell: [phone] Fax: [phone]

## 2024-01-16 NOTE — Progress Notes (Signed)
 Occupational Therapy Session Note  Patient Details  Name: Tyler David MRN: [id number] Date of Birth: 1945-11-05  Today's Date: 01/16/2024 OT Individual Time: 4098-1191 OT Individual Time Calculation (min): 44 min    Short Term Goals: Week 1:  OT Short Term Goal 1 (Week 1): Pt will perform toilet transfers with consistent Min A + LRAD. OT Short Term Goal 2 (Week 1): Pt will thread LB garments with supervision + LRAD. OT Short Term Goal 3 (Week 1): Pt will tolerate therapuetic activities >2 mins with decreased SOB.   Skilled Therapeutic Interventions/Progress Updates:    Pt up in recliner at time of session, no pain reported. Sit <> stands initially with heart pillow with MOD A before transfer, pt/spouse states he is trained to rest arms on RW but not pull, and did so throughout session to maintain precautions. Cues throughout for sternal precautions. Stand pivot transfer with RW recliner <> wheelchair with MOD fading to MIN, frequent verbal cues for pacing and sequencing. In gym, focused on standing balance for both single leg stances for LLE/RLE to improve GMC and strength to improve ADL tasks. In standing, pt also able to stand with no UE support for card matching task for approx 2-3 mins with CGA/MIN for standing balance. Back in room, transfer to recliner same as above, alarm on call bell in reach.   Therapy Documentation Precautions:  Precautions Precautions: Fall, Sternal Precaution/Restrictions Comments: R hemi Restrictions Weight Bearing Restrictions Per Provider Order: Yes RUE Weight Bearing Per Provider Order: Weight bearing as tolerated LUE Weight Bearing Per Provider Order: Weight bearing as tolerated Other Position/Activity Restrictions: sternal     Therapy/Group: Individual Therapy  Erasmo Score 01/16/2024, 11:18 AM

## 2024-01-17 MED ORDER — LORATADINE 10 MG PO TABS
10.0000 mg | ORAL_TABLET | Freq: Every day | ORAL | Status: DC
  Administered 2024-01-17 – 2024-01-23 (×7): 10 mg via ORAL
  Filled 2024-01-17 (×7): qty 1

## 2024-01-17 NOTE — Progress Notes (Signed)
 Occupational Therapy Session Note  Patient Details  Name: Tyler David MRN: [id number] Date of Birth: [birth year]  Today's Date: 01/17/2024 OT Individual Time: 1035-1130 OT Individual Time Calculation (min): 55 min    Short Term Goals: Week 1:  OT Short Term Goal 1 (Week 1): Pt will perform toilet transfers with consistent Min A + LRAD. OT Short Term Goal 2 (Week 1): Pt will thread LB garments with supervision + LRAD. OT Short Term Goal 3 (Week 1): Pt will tolerate therapuetic activities >2 mins with decreased SOB.  Skilled Therapeutic Interventions/Progress Updates:    1:1 Pt received in the recliner with wife present. Pt required mod A to come into standing from recliner. Pt ambulated with min A with extra time and cues for attention right right Le to the bathroom. PT able to void in standing with RW over the commode. Pt able to take about 10 steps backwards in the bathroom to the tub bench to undress with RW with cues for sequencing. Pt doffed LB clothing with mod A. Pt require max cues throughout session to maintain sternal precautions with sit to stand. Pt bathed sit to stand with min A for sit to stand and was able to wash peri area and buttocks in standing. Pt transferred out to the w/c stand step with min A with extra time. Pt did require min A to don shirt and needed A to thread right Le into pants. With cues for hands over chest pt able to stand with min A with increasing his weight shift forward and assistance to place right foot in the proper place.   Pt ambulated from sink to recliner with RW with min A with attention to position and lifting his right LE. Pt left sitting up in recliner with wife at his side and pad alarm on.   Therapy Documentation Precautions:  Precautions Precautions: Fall, Sternal Precaution/Restrictions Comments: R hemi Restrictions Weight Bearing Restrictions Per Provider Order: Yes RUE Weight Bearing Per Provider Order: Weight bearing as tolerated LUE  Weight Bearing Per Provider Order: Weight bearing as tolerated Other Position/Activity Restrictions: sternal  Pain: Pain Assessment Pain Scale: 0-10 Pain Score: 0-No pain    Therapy/Group: Individual Therapy  Roney Mans [facility] 01/17/2024, 2:17 PM

## 2024-01-17 NOTE — Progress Notes (Signed)
 Speech Language Pathology Daily Session Note  Patient Details  Name: LEDARRIUS BEAUCHAINE MRN: [id number] Date of Birth: 07/15/1946  Today's Date: 01/17/2024 SLP Individual Time: 0900-1000 SLP Individual Time Calculation (min): 60 min  Short Term Goals: Week 1: SLP Short Term Goal 1 (Week 1): Patient will utilize swallowing compensatory strategies during consumption of regular/thin diet with min multimodal A SLP Short Term Goal 2 (Week 1): Patient will demonstrate problem solving skills in mildly complex situations given min multimodal A  Skilled Therapeutic Interventions: Skilled therapy session focused on cognitive goals. SLP facilitated session by providing supervision A during hospital navigation task. SLP prompted patient to utilize signs for patient to navigate to [employer], gift shop, and cafeteria. Once at the gift shop, SLP prompted patient to locate various items, then identify three items under $15 for self, child and wife. Patient did so with mod iA. Patient then navigated back to room and independently recalled all activities completed during session and PT this AM. Patient left in chair with alarm set and call bell in reach. Wife present. Continue POC.  Pain None reported   Therapy/Group: Individual Therapy  Emmeline Winebarger M.A., CCC-SLP 01/17/2024, 7:45 AM

## 2024-01-17 NOTE — Progress Notes (Signed)
 PROGRESS NOTE   Subjective/Complaints: No events overnight.  Complaining of ongoing difficulty sleeping, stating that melatonin is no longer working; asking for something stronger as needed. Blood pressure is now in a better range overall, but other vitals are stable. Eating well, still with some interrupted sleep but improving Continent of bowel and bladder, last bowel movement 4-2  ROS: Denies fevers, chills, N/V, abdominal pain, constipation, diarrhea, SOB, cough, chest pain, new weakness or paraesthesias.   + Insomnia--intermittent, ongoing + Right leg pain--improved + Right > Left lower extremity edema--improved  Objective:   VAS [place] LOWER EXTREMITY VENOUS (DVT) Result Date: 01/15/2024  Lower Venous DVT Study Patient Name:  Tyler David  Date of Exam:   01/15/2024 Medical Rec #: [id number]          Accession #:    [id number] Date of Birth: 1946/05/07           Patient Gender: M Patient Age:   78 years Exam Location:  [facility] Procedure:      VAS [place] LOWER EXTREMITY VENOUS (DVT) Referring Phys: Elijah Birk --------------------------------------------------------------------------------  Indications: Edema. Other Indications: Recent CABG with right GSV harvest. Anticoagulation: Eliquis for atrial fibrillation.. Comparison Study: No prior study on file Performing Technologist: Sherren Kerns RVS  Examination Guidelines: A complete evaluation includes B-mode imaging, spectral Doppler, color Doppler, and power Doppler as needed of all accessible portions of each vessel. Bilateral testing is considered an integral part of a complete examination. Limited examinations for reoccurring indications may be performed as noted. The reflux portion of the exam is performed with the patient in reverse Trendelenburg.  +---------+---------------+---------+-----------+---------------+-------------+ RIGHT     CompressibilityPhasicitySpontaneityProperties     Thrombus                                                                 Aging         +---------+---------------+---------+-----------+---------------+-------------+ CFV      Full           Yes      No         Pulsatile                                                                waveform                     +---------+---------------+---------+-----------+---------------+-------------+ SFJ      Full                                                            +---------+---------------+---------+-----------+---------------+-------------+  FV Prox  Full                                                            +---------+---------------+---------+-----------+---------------+-------------+ FV Mid   Full                                                            +---------+---------------+---------+-----------+---------------+-------------+ FV DistalFull                                                            +---------+---------------+---------+-----------+---------------+-------------+ PFV      Full                                                            +---------+---------------+---------+-----------+---------------+-------------+ POP      Full           Yes      No         Pulsatile                                                                waveform                     +---------+---------------+---------+-----------+---------------+-------------+ PTV      Full                                                            +---------+---------------+---------+-----------+---------------+-------------+ PERO     Full                                                            +---------+---------------+---------+-----------+---------------+-------------+ Gastroc  Full                                                             +---------+---------------+---------+-----------+---------------+-------------+   +----+---------------+---------+-----------+------------------+--------------+ LEFTCompressibilityPhasicitySpontaneityProperties        Thrombus Aging +----+---------------+---------+-----------+------------------+--------------+ CFV Full           Yes  No         Pulsatile waveform               +----+---------------+---------+-----------+------------------+--------------+ SFJ Full                                                                +----+---------------+---------+-----------+------------------+--------------+     Summary: RIGHT: - There is no evidence of deep vein thrombosis in the lower extremity.  - No cystic structure found in the popliteal fossa.  LEFT: - No evidence of common femoral vein obstruction.   *See table(s) above for measurements and observations. Electronically signed by Sherald Hess MD on 01/15/2024 at 12:29:32 PM.    Final    Recent Labs    01/15/24 0603  WBC 9.2  HGB 9.6*  HCT 29.7*  PLT 384   Recent Labs    01/15/24 0603  NA 141  K 4.5  CL 109  CO2 23  GLUCOSE 101*  BUN 30*  CREATININE 2.22*  CALCIUM 8.3*    Intake/Output Summary (Last 24 hours) at 01/17/2024 0808 Last data filed at 01/17/2024 0731 Gross per 24 hour  Intake 798 ml  Output 600 ml  Net 198 ml        Physical Exam: Vital Signs Blood pressure 131/60, pulse 65, temperature 98.7 F (37.1 C), resp. rate 18, height 5\' 8"  (1.727 m), weight 85.2 kg, SpO2 94%. Constitutional: No apparent distress. Appropriate appearance for age.  Sitting up in bedside chair. HENT: No JVD. Neck Supple. Trachea midline. Atraumatic, normocephalic. Eyes: PERRLA. EOMI. Visual fields grossly intact.  Cardiovascular: RRR, no murmurs/rub/gallops trace bilateral lower extremity edema, right greater than left,, in TED hose  Respiratory: CTAB. No rales, rhonchi, or wheezing. On RA.  Abdomen: + bowel sounds,  normoactive. No distention or tenderness.   Skin: Vein harvesting site from right lower extremity appears clean, dry, well-approximated.  No surrounding erythema or warmth.--Unchanged  MSK:      No apparent deformity.   Neurologic exam:  Awake, alert, oriented x 3. Some mild cognitive delay and memory issues--improving No dysarthria, language intact Cranial nerves II through XII grossly intact Strength: RUE 4+/5. LUE 3 to 3+/5 prox to 4- at hand and wrist. RLE 2/5 HF, KE and trace to absent at ankle. LLE 3-4/5 prox to distal.  Light touch: Intact in all 4 extremities Reflexes: DTR's brisk and 2+ in all 4's. Toes up right foot.    No significant changes 4-2   Assessment/Plan: 1. Functional deficits which require 3+ hours per day of interdisciplinary therapy in a comprehensive inpatient rehab setting. Physiatrist is providing close team supervision and 24 hour management of active medical problems listed below. Physiatrist and rehab team continue to assess barriers to discharge/monitor patient progress toward functional and medical goals  Care Tool:  Bathing    Body parts bathed by patient: Right arm, Left arm, Chest, Abdomen, Front perineal area, Face, Right upper leg, Left upper leg   Body parts bathed by helper: Buttocks, Right lower leg, Left lower leg     Bathing assist Assist Level: Moderate Assistance - Patient 50 - 74%     Upper Body Dressing/Undressing Upper body dressing   What is the patient wearing?: Pull over shirt    Upper body assist Assist Level: Set  up assist    Lower Body Dressing/Undressing Lower body dressing      What is the patient wearing?: Underwear/pull up, Pants     Lower body assist Assist for lower body dressing: Maximal Assistance - Patient 25 - 49%     Toileting Toileting    Toileting assist Assist for toileting: Maximal Assistance - Patient 25 - 49%     Transfers Chair/bed transfer  Transfers assist     Chair/bed transfer  assist level: Maximal Assistance - Patient 25 - 49%     Locomotion Ambulation   Ambulation assist   Ambulation activity did not occur: Safety/medical concerns          Walk 10 feet activity   Assist  Walk 10 feet activity did not occur: Safety/medical concerns        Walk 50 feet activity   Assist Walk 50 feet with 2 turns activity did not occur: Safety/medical concerns         Walk 150 feet activity   Assist Walk 150 feet activity did not occur: Safety/medical concerns         Walk 10 feet on uneven surface  activity   Assist Walk 10 feet on uneven surfaces activity did not occur: Safety/medical concerns         Wheelchair     Assist Is the patient using a wheelchair?: Yes Type of Wheelchair: Manual    Wheelchair assist level: Dependent - Patient 0%      Wheelchair 50 feet with 2 turns activity    Assist        Assist Level: Dependent - Patient 0%   Wheelchair 150 feet activity     Assist      Assist Level: Dependent - Patient 0%   Blood pressure 131/60, pulse 65, temperature 98.7 F (37.1 C), resp. rate 18, height 5\' 8"  (1.727 m), weight 85.2 kg, SpO2 94%.  Medical Problem List and Plan: 1. 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  Sternal precautions             -patient may  shower             -ELOS/Goals: 18-21 days, goals supervision to min assist with PT and OT and mod I to supervision with SLP - 4/10 DC date   - Continue CIR, team conference tomorrow  4/1: Mod-Max LB ADLs, limited by weakness. Needs BSC and tub transfer bench. Fatigue improving; Mod A bed mobnility and transfers, walks 60 ft RW with CGA/Min A. May meet SPV goals. Has some decreased insight and c/b sternal precautions requiring frequent cues. Targetting complex tasks for SLP.  2.  Antithrombotics: -DVT/anticoagulation:  Pharmaceutical: Eliquis             -antiplatelet  therapy: Aspirin 81 mg daily  -3-30: Bilateral lower extremity duplex ordered for right lower extremity increased edema, pain.  Negative for DVT right lower extremity  3. Pain Management: Oxycodone as needed 4. Mood/Behavior/Sleep: Provide emotional support             -antipsychotic agents: N/A             -pt with persistent fatigue             -record sleep chart             -melatonin prn insomnia  3-29: Patient refused as needed melatonin overnight; encouraged use as needed  3-30: Slept much better with melatonin  3-31 increase melatonin to 5 mg--with benefit per patient  4-2: Schedule melatonin 5 mg nightly.  Add as needed Benadryl 25 mg nightly.  5. Neuropsych/cognition: This patient is capable of making decisions on his own behalf. 6. Skin/Wound Care: Routine skin checks 7. Fluids/Electrolytes/Nutrition: Routine in and outs with follow-up chemistries on Monday             -encourage PO   - BMP Qmon, thurs  8.  Postoperative atrial fibrillation.  Amiodarone 400 mg twice daily as well as Lopressor 12.5 mg twice daily.  Will discuss plan to decrease dosage  -Regular rhythm.  9.  Acute blood loss anemia.  Latest hemoglobin 9.8.  Follow-up CBC  -3-4 9: Admission hemoglobin 9.5; stable  10.  Hypertension/HFrEF.  Norvasc 10 mg daily. Lopressor 12.5 m BID.  Monitor with increased mobility  -01-12-29: Moderately hypertensive; some permission given recent CVA.  Monitor for now.  3-30: Remains hypertensive, increasing peripheral edema, discharge meds include Lasix 40 mg daily--held on rehab admission.  Will start daily weights and resume Lasix at 20 mg daily. Last BNP per chart review 570; will add to AM labs.  -3/31 weights overall stable, intermittently hypertensive. Continue lasix, BNP up to 1263  1/4: Blood pressure is improving, weight downtrending since resumption of Lasix--increase back to home dose of 40 mg daily.  4/2: Blood pressure improved, weight downtrending.  Monitor for  orthostasis.    01/17/2024    5:00 AM 01/17/2024    4:23 AM 01/16/2024    8:00 PM  Vitals with BMI  Weight 187 lbs 13 oz    BMI 28.57    Systolic  131 149  Diastolic  60 64  Pulse  65    Filed Weights   01/12/24 1400 01/15/24 0400 01/17/24 0500  Weight: 87.3 kg 87 kg 85.2 kg    11.  Hyperlipidemia.  Zetia 12.  Hypothyroidism.  Synthroid 13.  CKD stage III.  Creatinine baseline 1.98-2.41             -f/u labs Monday   3-29: Admission labs stable, creatinine down to 2.08  3/31 creatinine a little higher at 2.22  14.  GERD.  Protonix  15.  Leukocytosis.  Mild, 12.2 today from 10.5 prior.  Monitor for systemic signs of illness and repeat labs Monday.  -3-30: No fevers, no concerning signs of infection.  Will check duplex in case of right lower extremity DVT due to increasing edema in this leg--likely just from vein harvesting  3/31 WBC down to 9.2  4/1: labs in AM  LOS: 5 days A FACE TO FACE EVALUATION WAS PERFORMED  Angelina Sheriff 01/17/2024, 8:08 AM

## 2024-01-17 NOTE — Progress Notes (Signed)
 Physical Therapy Session Note  Patient Details  Name: Tyler David MRN: [id number] Date of Birth: 05/17/1946  Today's Date: 01/17/2024 PT Individual Time: 0802-0846 PT Individual Time Calculation (min): 44 min   Short Term Goals: Week 1:  PT Short Term Goal 1 (Week 1): Pt will roll side to side w/ min A. PT Short Term Goal 2 (Week 1): Pt will transfer sit to stand w/ mod A consistently. PT Short Term Goal 3 (Week 1): Pt will transfer SPT bed <> w/c w/ mod A consistently. PT Short Term Goal 4 (Week 1): PT will assess gait as able.  Skilled Therapeutic Interventions/Progress Updates:    Pt presents in room seated in recliner, denies pain, motivated to participate with PT. Session focused on gait training for tolerance to upright and gait mechanics as well as therapeutic activities for transfer training.  Therapist dons TED hose and shoes dependently for time management. Pt completes to to stand from recliner with mod assist with pt demonstrating decreased anterior wt shift, then completes transfer with RW to [facility] with min assist. Pt transported to day room dependently with instruction for maintaining BLE LAQs for transport.  Pt completes gait with RW 2x175' with mod cues for RLE foot clearance and proximity to RW as pt demonstrating decreased proximity to RW requiring min assist for postural stability with turns, CGA for straightaways.  Pt completes transfer training 2x5 sit<>stands with cues for anterior wt shift, visual demonstration  provided to promote optimal body mechanics, pt does this task without device to promote improved anterior translation with task. Pt requires min assist.  Pt completes gait training navigating x3 cones 4 trials with CGA/min assist with max verbal cues for maintaining proximity to RW.  Pt returns to room dependently via WC and completes ambulatory transfer to recliner with min assist. Pt remains seated in recliner with all needs within reach, cal light in place  and chair alarm activated at end of session.    Therapy Documentation Precautions:  Precautions Precautions: Fall, Sternal Precaution/Restrictions Comments: R hemi Restrictions Weight Bearing Restrictions Per Provider Order: Yes RUE Weight Bearing Per Provider Order: Weight bearing as tolerated LUE Weight Bearing Per Provider Order: Weight bearing as tolerated Other Position/Activity Restrictions: sternal    Therapy/Group: Individual Therapy  Edwin Cap PT, DPT 01/17/2024, 8:46 AM

## 2024-01-17 NOTE — Progress Notes (Signed)
 Physical Therapy Session Note  Patient Details  Name: Tyler David MRN: [id number] Date of Birth: [birth year]  Today's Date: 01/17/2024 PT Individual Time: 1345-1430 PT Individual Time Calculation (min): 45 min   Short Term Goals: Week 1:  PT Short Term Goal 1 (Week 1): Pt will roll side to side w/ min A. PT Short Term Goal 2 (Week 1): Pt will transfer sit to stand w/ mod A consistently. PT Short Term Goal 3 (Week 1): Pt will transfer SPT bed <> w/c w/ mod A consistently. PT Short Term Goal 4 (Week 1): PT will assess gait as able.  Skilled Therapeutic Interventions/Progress Updates:      Therapy Documentation Precautions:  Precautions Precautions: Fall, Sternal Precaution/Restrictions Comments: R hemi Restrictions Weight Bearing Restrictions Per Provider Order: Yes RUE Weight Bearing Per Provider Order: Weight bearing as tolerated LUE Weight Bearing Per Provider Order: Weight bearing as tolerated Other Position/Activity Restrictions: sternal  Pt agreeable to PT session with emphasis on gait training and denies pain. Pt min/mod A with sit<>stands and CGA with gait following distances:   -100' RW  -215' 4 wheeled shopping cart   -270' 4 wheeled shopping cart + RW   -10' (min L HHA)  Pt requires min cues cues for right foot clearance "up" and left seated in recliner at bedside with all needs in reach and alarm on.    Therapy/Group: Individual Therapy  Truitt Leep Truitt Leep PT, DPT  01/17/2024, 4:05 PM

## 2024-01-17 NOTE — Progress Notes (Signed)
 Occupational Therapy Session Note  Patient Details  Name: JONANTHONY NAHAR MRN: [id number] Date of Birth: 1945-12-15  {CHL IP REHAB OT TIME CALCULATIONS:304400400}  {CHL IP REHAB OT TIME CALCULATIONS:304400400}  Short Term Goals: Week 1:  OT Short Term Goal 1 (Week 1): Pt will perform toilet transfers with consistent Min A + LRAD. OT Short Term Goal 2 (Week 1): Pt will thread LB garments with supervision + LRAD. OT Short Term Goal 3 (Week 1): Pt will tolerate therapuetic activities >2 mins with decreased SOB.  Skilled Therapeutic Interventions/Progress Updates:   Session 1: Pt greeted *** for skilled OT session with focus on ***.   Pain: Pt reported ***/10 pain, stating "***" in reference to ***. OT offering intermediate rest breaks and positioning suggestions throughout session to address pain/fatigue and maximize participation/safety in session.   Functional Transfers:  Self Care Tasks: Pt completes the following self care tasks with levels of assistance noted below, UB: LB:   Therapeutic Activities:  Therapeutic Exercise:   Education:  Pt remained *** with 4Ps assessed and immediate needs met. Pt continues to be appropriate for skilled OT intervention to promote further functional independence in ADLs/IADLs.    Session 2: Pt greeted *** for skilled OT session with focus on ***.   Pain: Pt reported ***/10 pain, stating "***" in reference to ***. OT offering intermediate rest breaks and positioning suggestions throughout session to address pain/fatigue and maximize participation/safety in session.   Functional Transfers:  Self Care Tasks: Pt completes the following self care tasks with levels of assistance noted below, UB: LB:   Therapeutic Activities:  Therapeutic Exercise:   Education:  Pt remained *** with 4Ps assessed and immediate needs met. Pt continues to be appropriate for skilled OT intervention to promote further functional independence in ADLs/IADLs.    Therapy Documentation Precautions:  Precautions Precautions: Fall, Sternal Precaution/Restrictions Comments: R hemi Restrictions Weight Bearing Restrictions Per Provider Order: Yes RUE Weight Bearing Per Provider Order: Weight bearing as tolerated LUE Weight Bearing Per Provider Order: Weight bearing as tolerated Other Position/Activity Restrictions: sternal   Therapy/Group: Individual Therapy  Lou Cal, OTR/L, MSOT  01/17/2024, 7:09 PM

## 2024-01-18 LAB — BASIC METABOLIC PANEL WITH GFR
Anion gap: 10 (ref 5–15)
BUN: 30 mg/dL — ABNORMAL HIGH (ref 8–23)
CO2: 24 mmol/L (ref 22–32)
Calcium: 8 mg/dL — ABNORMAL LOW (ref 8.9–10.3)
Chloride: 105 mmol/L (ref 98–111)
Creatinine, Ser: 2.37 mg/dL — ABNORMAL HIGH (ref 0.61–1.24)
GFR, Estimated: 27 mL/min — ABNORMAL LOW (ref >60)
Glucose, Bld: 93 mg/dL (ref 70–99)
Potassium: 3.7 mmol/L (ref 3.5–5.1)
Sodium: 139 mmol/L (ref 135–145)

## 2024-01-18 LAB — CBC WITH DIFFERENTIAL/PLATELET
Abs Immature Granulocytes: 0.04 10*3/uL (ref 0.00–0.07)
Basophils Absolute: 0.1 10*3/uL (ref 0.0–0.1)
Basophils Relative: 1 %
Eosinophils Absolute: 1 10*3/uL — ABNORMAL HIGH (ref 0.0–0.5)
Eosinophils Relative: 9 %
HCT: 29.7 % — ABNORMAL LOW (ref 39.0–52.0)
Hemoglobin: 9.8 g/dL — ABNORMAL LOW (ref 13.0–17.0)
Immature Granulocytes: 0 %
Lymphocytes Relative: 10 %
Lymphs Abs: 1.1 10*3/uL (ref 0.7–4.0)
MCH: 29.5 pg (ref 26.0–34.0)
MCHC: 33 g/dL (ref 30.0–36.0)
MCV: 89.5 fL (ref 80.0–100.0)
Monocytes Absolute: 1 10*3/uL (ref 0.1–1.0)
Monocytes Relative: 9 %
Neutro Abs: 7.6 10*3/uL (ref 1.7–7.7)
Neutrophils Relative %: 71 %
Platelets: 401 10*3/uL — ABNORMAL HIGH (ref 150–400)
RBC: 3.32 MIL/uL — ABNORMAL LOW (ref 4.22–5.81)
RDW: 13.9 % (ref 11.5–15.5)
WBC: 10.8 10*3/uL — ABNORMAL HIGH (ref 4.0–10.5)
nRBC: 0 % (ref 0.0–0.2)

## 2024-01-18 MED ORDER — FLUTICASONE PROPIONATE 50 MCG/ACT NA SUSP
1.0000 | Freq: Every day | NASAL | Status: DC
  Administered 2024-01-18 – 2024-01-23 (×6): 1 via NASAL
  Filled 2024-01-18: qty 16

## 2024-01-18 MED ORDER — DIPHENHYDRAMINE HCL 25 MG PO CAPS
25.0000 mg | ORAL_CAPSULE | Freq: Every evening | ORAL | Status: DC | PRN
  Administered 2024-01-18: 25 mg via ORAL
  Filled 2024-01-18: qty 1

## 2024-01-18 MED ORDER — MELATONIN 5 MG PO TABS
5.0000 mg | ORAL_TABLET | Freq: Every day | ORAL | Status: DC
Start: 2024-01-18 — End: 2024-01-23
  Administered 2024-01-18 – 2024-01-22 (×5): 5 mg via ORAL
  Filled 2024-01-18 (×5): qty 1

## 2024-01-18 NOTE — Progress Notes (Signed)
 Occupational Therapy Weekly Progress Note  Patient Details  Name: Tyler David MRN: [id number] Date of Birth: [birth year]  Beginning of progress report period: January 13, 2024 End of progress report period: December 19, 2023   Patient has met {number 1-5:22450} of {number 1-5:20334} short term goals.  ***  Patient continues to demonstrate the following deficits: {impairments:3041632} and therefore will continue to benefit from skilled OT intervention to enhance overall performance with {ADL/iADL:3041649}.  Patient {LTG progression:3041653}.  {plan of QION:6295284}  OT Short Term Goals {OT [id number]   Therapy Documentation Precautions:  Precautions Precautions: Fall, Sternal Precaution/Restrictions Comments: R hemi Restrictions Weight Bearing Restrictions Per Provider Order: Yes RUE Weight Bearing Per Provider Order: Non weight bearing LUE Weight Bearing Per Provider Order: Non weight bearing Other Position/Activity Restrictions: sternal   Therapy/Group: Individual Therapy  Lou Cal, OTR/L, MSOT  01/18/2024, 9:20 PM

## 2024-01-18 NOTE — Plan of Care (Signed)
  Problem: Consults Goal: RH STROKE PATIENT EDUCATION Description: See Patient Education module for education specifics  Outcome: Progressing   Problem: RH BOWEL ELIMINATION Goal: RH STG MANAGE BOWEL WITH ASSISTANCE Description: STG Manage Bowel with minimal  Assistance. Outcome: Progressing   Problem: RH BLADDER ELIMINATION Goal: RH STG MANAGE BLADDER WITH ASSISTANCE Description:  Manage Bladder with minimal   Assistance Outcome: Progressing   Problem: RH SKIN INTEGRITY Goal: RH STG SKIN FREE OF INFECTION/BREAKDOWN Description: Manage skin free of infection with minimal assistance Outcome: Progressing   Problem: RH SAFETY Goal: RH STG ADHERE TO SAFETY PRECAUTIONS W/ASSISTANCE/DEVICE Description: STG Adhere to Safety Precautions With minimal Assistance/Device. Outcome: Progressing   Problem: RH KNOWLEDGE DEFICIT Goal: RH STG INCREASE KNOWLEDGE OF DIABETES Description:  Manage increase  knowledge of diabetes with minimal assistance using educational materials provided  Outcome: Progressing Goal: RH STG INCREASE KNOWLEDGE OF HYPERTENSION Description: Manage increase  knowledge of hypertension with minimal assistance using educational materials provided  Outcome: Progressing Goal: RH STG INCREASE KNOWLEGDE OF HYPERLIPIDEMIA Description: Manage increase  knowledge of hyperlipidemia  with minimal assistance using educational materials provided  Outcome: Progressing Goal: RH STG INCREASE KNOWLEDGE OF STROKE PROPHYLAXIS Description: Manage increase  knowledge of stroke prophylaxis  with minimal assistance using educational materials provided  Outcome: Progressing

## 2024-01-18 NOTE — Progress Notes (Signed)
 Occupational Therapy Session Note  Patient Details  Name: SEIYA SILSBY MRN: [id number] Date of Birth: [birth year]  {CHL IP REHAB OT TIME CALCULATIONS:304400400}  {CHL IP REHAB OT TIME CALCULATIONS:304400400}  Short Term Goals: Week 1:  OT Short Term Goal 1 (Week 1): Pt will perform toilet transfers with consistent Min A + LRAD. OT Short Term Goal 2 (Week 1): Pt will thread LB garments with supervision + LRAD. OT Short Term Goal 3 (Week 1): Pt will tolerate therapuetic activities >2 mins with decreased SOB.  Skilled Therapeutic Interventions/Progress Updates:   Session 1:   Session 2:   Therapy Documentation Precautions:  Precautions Precautions: Fall, Sternal Precaution/Restrictions Comments: R hemi Restrictions Weight Bearing Restrictions Per Provider Order: Yes RUE Weight Bearing Per Provider Order: Non weight bearing LUE Weight Bearing Per Provider Order: Non weight bearing Other Position/Activity Restrictions: sternal   Therapy/Group: Individual Therapy  Lou Cal, OTR/L, MSOT  01/18/2024, 9:19 PM

## 2024-01-18 NOTE — Progress Notes (Signed)
 Physical Therapy Session Note  Patient Details  Name: FINCH COSTANZO MRN: [id number] Date of Birth: [birth year]  Today's Date: 01/18/2024 PT Individual Time: 1138-1205 PT Individual Time Calculation (min): 27 min   Short Term Goals: Week 1:  PT Short Term Goal 1 (Week 1): Pt will roll side to side w/ min A. PT Short Term Goal 2 (Week 1): Pt will transfer sit to stand w/ mod A consistently. PT Short Term Goal 3 (Week 1): Pt will transfer SPT bed <> w/c w/ mod A consistently. PT Short Term Goal 4 (Week 1): PT will assess gait as able.  Skilled Therapeutic Interventions/Progress Updates:    Pt presents in room in recliner, reports fatigue but agreeable to PT. Pt does not report pain at this time. Session focused on gait training and NMR for RLE muscle fiber recruitment needed for dynamic standing balance and gait mechanics.  Pt provided with mod verbal cues for body mechanics prior to standing, requires min assist to stand from recliner with cues. Pt ambulates ~100' from room to day room with RW, cues for proximity to RW and RLE foot clearance CGA for task, comes to sitting on EOM.  Pt completes NMR for RLE muscle fiber recruitment, completes all exercises on LLE prior to RLE for mirror neuron activation including: - seated heel slide x10 BLE - standing hamstring curl x10 BLE (active assist RLE improved hamstring activiation with repetition) - standing hip extension x10 BLE (active assist RLE) - sit<>stands x10 from elevated EOM - step taps x10 BLE (improved technique with repetition on RLE)  Pt returns to room ambulating ~175' with RW and remains seated in recliner with all needs within reach, cal light in place and chair alarm  activated at end of session.   Therapy Documentation Precautions:  Precautions Precautions: Fall, Sternal Precaution/Restrictions Comments: R hemi Restrictions Weight Bearing Restrictions Per Provider Order: Yes RUE Weight Bearing Per Provider Order: Weight  bearing as tolerated LUE Weight Bearing Per Provider Order: Weight bearing as tolerated Other Position/Activity Restrictions: sternal  Therapy/Group: Individual Therapy  Edwin Cap PT, DPT 01/18/2024, 12:09 PM

## 2024-01-18 NOTE — Progress Notes (Signed)
 Speech Language Pathology Daily Session Note  Patient Details  Name: Tyler David MRN: [id number] Date of Birth: July 12, 1946  Today's Date: 01/18/2024 SLP Individual Time: 1005-1100 SLP Individual Time Calculation (min): 55 min  Short Term Goals: Week 1: SLP Short Term Goal 1 (Week 1): Patient will utilize swallowing compensatory strategies during consumption of regular/thin diet with min multimodal A SLP Short Term Goal 2 (Week 1): Patient will demonstrate problem solving skills in mildly complex situations given min multimodal A  Skilled Therapeutic Interventions: SLP conducted skilled therapy session targeting cognitive retraining goals. Patient recalled events from prior therapy sessions as well as discussions re: impact of sleep on cognitive function and various problem solving strategies provided during previous sessions with modI. SLP facilitated mildly complex problem solving crossword task requiring scanning, sequencing, deductive reasoning, and organization skills. Patient benefited from supervision for organization strategy recommended as well as working memory, but accurately utilized deductive reasoning and scanning skills with modI. Patient endorses changes to vision during task, as it required reading of small text. He notes that he will plan to see eye doctor post-discharge to address. Patient was left in chair with call bell in reach and chair alarm set. Discussed plan to discharge patient from SLP services given previous success with medication management, financial management, and navigation tasks. Patient notes his cognition still does not feel back to what he's used to. In discussion with OT, reports reduced carryover of recommended exercises from beginning to end of session. SLP will plan to continue with patient, targeting working memory and memory strategies prior to imminent discharge. Patient was left in lowered bed with call bell in reach and bed alarm set. SLP will continue  to target goals per plan of care.       Pain None reported    Therapy/Group: Individual Therapy  [name] 01/18/2024, 12:43 PM

## 2024-01-18 NOTE — Progress Notes (Signed)
 PROGRESS NOTE   Subjective/Complaints: No events overnight.  States he had the best night of sleep he has had yet.  Planing of some postnasal drip, has seasonal allergies.  Vital stable.  Weight stable.  Edema improving   ROS: Denies fevers, chills, N/V, abdominal pain, constipation, diarrhea, SOB, cough, chest pain, new weakness or paraesthesias.   + Insomnia--intermittent, ongoing + Right leg pain--improved + Right > Left lower extremity edema--improved + Allergic rhinitis  Objective:   No results found.  Recent Labs    01/18/24 0622  WBC 10.8*  HGB 9.8*  HCT 29.7*  PLT 401*   Recent Labs    01/18/24 0622  NA 139  K 3.7  CL 105  CO2 24  GLUCOSE 93  BUN 30*  CREATININE 2.37*  CALCIUM 8.0*    Intake/Output Summary (Last 24 hours) at 01/18/2024 0939 Last data filed at 01/18/2024 0700 Gross per 24 hour  Intake 520 ml  Output 600 ml  Net -80 ml        Physical Exam: Vital Signs Blood pressure (!) 145/62, pulse 73, temperature 97.7 F (36.5 C), resp. rate 18, height 5\' 8"  (1.727 m), weight 85.5 kg, SpO2 99%. Constitutional: No apparent distress. Appropriate appearance for age.  In bedside chair, eating breakfast.   HENT: No JVD. Neck Supple. Trachea midline. Atraumatic, normocephalic.  No obvious erythema or discharge from nare. Eyes: PERRLA. EOMI. Visual fields grossly intact.  Cardiovascular: RRR, no murmurs/rub/gallops trace bilateral lower extremity edema, right greater than left,, in TED hose  Respiratory: CTAB. No rales, rhonchi, or wheezing. On RA.  Abdomen: + bowel sounds, normoactive. No distention or tenderness.   Skin: Vein harvesting site from right lower extremity appears clean, dry, well-approximated.  No surrounding erythema or warmth.--Unchanged  MSK:      No apparent deformity.   Neurologic exam:  Awake, alert, oriented x 3. Some mild cognitive delay and memory  issues--improving No dysarthria, language intact Cranial nerves II through XII grossly intact Strength: RUE 4+/5. LUE 3 to 3+/5 prox to 4- at hand and wrist. RLE 2/5 HF, KE and trace to absent at ankle. LLE 3-4/5 prox to distal.  Light touch: Intact in all 4 extremities Reflexes: DTR's brisk and 2+ in all 4's. Toes up right foot.    No significant changes 4-3   Assessment/Plan: 1. Functional deficits which require 3+ hours per day of interdisciplinary therapy in a comprehensive inpatient rehab setting. Physiatrist is providing close team supervision and 24 hour management of active medical problems listed below. Physiatrist and rehab team continue to assess barriers to discharge/monitor patient progress toward functional and medical goals  Care Tool:  Bathing    Body parts bathed by patient: Right arm, Left arm, Chest, Abdomen, Front perineal area, Face, Right upper leg, Left upper leg, Buttocks   Body parts bathed by helper: Right lower leg, Left lower leg     Bathing assist Assist Level: Moderate Assistance - Patient 50 - 74%     Upper Body Dressing/Undressing Upper body dressing   What is the patient wearing?: Pull over shirt    Upper body assist Assist Level: Minimal Assistance - Patient > 75%  Lower Body Dressing/Undressing Lower body dressing      What is the patient wearing?: Underwear/pull up, Pants     Lower body assist Assist for lower body dressing: Moderate Assistance - Patient 50 - 74%     Toileting Toileting    Toileting assist Assist for toileting: Minimal Assistance - Patient > 75% (in standing)     Transfers Chair/bed transfer  Transfers assist     Chair/bed transfer assist level: Moderate Assistance - Patient 50 - 74%     Locomotion Ambulation   Ambulation assist   Ambulation activity did not occur: Safety/medical concerns          Walk 10 feet activity   Assist  Walk 10 feet activity did not occur: Safety/medical  concerns        Walk 50 feet activity   Assist Walk 50 feet with 2 turns activity did not occur: Safety/medical concerns         Walk 150 feet activity   Assist Walk 150 feet activity did not occur: Safety/medical concerns         Walk 10 feet on uneven surface  activity   Assist Walk 10 feet on uneven surfaces activity did not occur: Safety/medical concerns         Wheelchair     Assist Is the patient using a wheelchair?: Yes Type of Wheelchair: Manual    Wheelchair assist level: Dependent - Patient 0%      Wheelchair 50 feet with 2 turns activity    Assist        Assist Level: Dependent - Patient 0%   Wheelchair 150 feet activity     Assist      Assist Level: Dependent - Patient 0%   Blood pressure (!) 145/62, pulse 73, temperature 97.7 F (36.5 C), resp. rate 18, height 5\' 8"  (1.727 m), weight 85.5 kg, SpO2 99%.  Medical Problem List and Plan: 1. 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  Sternal precautions             -patient may  shower             -ELOS/Goals: 18-21 days, goals supervision to min assist with PT and OT and mod I to supervision with SLP - 4/10 DC date   - Continue CIR, team conference tomorrow  4/1: Mod-Max LB ADLs, limited by weakness. Needs BSC and tub transfer bench. Fatigue improving; Mod A bed mobnility and transfers, walks 60 ft RW with CGA/Min A. May meet SPV goals. Has some decreased insight and c/b sternal precautions requiring frequent cues. Targetting complex tasks for SLP.  2.  Antithrombotics: -DVT/anticoagulation:  Pharmaceutical: Eliquis             -antiplatelet therapy: Aspirin 81 mg daily  -3-30: Bilateral lower extremity duplex ordered for right lower extremity increased edema, pain.  Negative for DVT right lower extremity  3. Pain Management: Oxycodone as needed 4. Mood/Behavior/Sleep: Provide emotional support              -antipsychotic agents: N/A             -pt with persistent fatigue             -record sleep chart             -melatonin prn insomnia  3-29: Patient refused as needed melatonin overnight; encouraged use as needed  3-30: Slept much better with melatonin  3-31 increase melatonin  to 5 mg--with benefit per patient  4-2: Schedule melatonin 5 mg nightly.  Add as needed Benadryl 25 mg nightly.  5. Neuropsych/cognition: This patient is capable of making decisions on his own behalf. 6. Skin/Wound Care: Routine skin checks 7. Fluids/Electrolytes/Nutrition: Routine in and outs with follow-up chemistries on Monday             -encourage PO   - BMP Qmon, thurs  8.  Postoperative atrial fibrillation.  Amiodarone 400 mg twice daily as well as Lopressor 12.5 mg twice daily.  Will discuss plan to decrease dosage  -Regular rhythm.  9.  Acute blood loss anemia.  Latest hemoglobin 9.8.  Follow-up CBC  -3-4 9: Admission hemoglobin 9.5; stable  10.  Hypertension/HFrEF.  Norvasc 10 mg daily. Lopressor 12.5 m BID.  Monitor with increased mobility  -01-12-29: Moderately hypertensive; some permission given recent CVA.  Monitor for now.  3-30: Remains hypertensive, increasing peripheral edema, discharge meds include Lasix 40 mg daily--held on rehab admission.  Will start daily weights and resume Lasix at 20 mg daily. Last BNP per chart review 570; will add to AM labs.  -3/31 weights overall stable, intermittently hypertensive. Continue lasix, BNP up to 1263  1/4: Blood pressure is improving, weight downtrending since resumption of Lasix--increase back to home dose of 40 mg daily.  4/2: Blood pressure improved, weight downtrending.  Monitor for orthostasis.    01/18/2024    7:51 AM 01/18/2024    6:00 AM 01/18/2024    4:30 AM  Vitals with BMI  Weight  188 lbs 8 oz   BMI  28.67   Systolic 145  157  Diastolic 62  73  Pulse 73  72   Filed Weights   01/15/24 0400 01/17/24 0500 01/18/24 0600  Weight: 87 kg  85.2 kg 85.5 kg    11.  Hyperlipidemia.  Zetia 12.  Hypothyroidism.  Synthroid 13.  CKD stage III.  Creatinine baseline 1.98-2.41             -f/u labs Monday   3-29: Admission labs stable, creatinine down to 2.08  3/31 creatinine a little higher at 2.22  4/3: Cr 2.37, within his normal, repeat in 1-2 days and consider decrease in Lasix if needed  14.  GERD.  Protonix  15.  Leukocytosis.  Mild, 12.2 today from 10.5 prior.  Monitor for systemic signs of illness and repeat labs Monday.  -3-30: No fevers, no concerning signs of infection.  Will check duplex in case of right lower extremity DVT due to increasing edema in this leg--likely just from vein harvesting  3/31 WBC down to 9.2  4/1: labs in AM--slightly increased at 10.8, staying in range, monitor for signs of infection  16.  Allergic rhinitis. -Claritin 10 mg daily -Flonase 1 spray into each nare daily -As needed Benadryl  LOS: 6 days A FACE TO FACE EVALUATION WAS PERFORMED  Angelina Sheriff 01/18/2024, 9:39 AM

## 2024-01-19 ENCOUNTER — Ambulatory Visit: Payer: Self-pay | Admitting: Thoracic Surgery (Cardiothoracic Vascular Surgery)

## 2024-01-19 DIAGNOSIS — I2510 Atherosclerotic heart disease of native coronary artery without angina pectoris: Secondary | ICD-10-CM

## 2024-01-19 MED ORDER — HYDROCORTISONE 1 % EX CREA
TOPICAL_CREAM | Freq: Two times a day (BID) | CUTANEOUS | Status: DC | PRN
  Filled 2024-01-19: qty 28

## 2024-01-19 MED ORDER — DIPHENHYDRAMINE HCL 25 MG PO CAPS
25.0000 mg | ORAL_CAPSULE | Freq: Every evening | ORAL | Status: DC | PRN
  Administered 2024-01-19 – 2024-01-21 (×3): 25 mg via ORAL
  Filled 2024-01-19 (×3): qty 1

## 2024-01-19 MED ORDER — NON FORMULARY
1.0000 | Freq: Two times a day (BID) | Status: DC
  Administered 2024-01-19: 1 via ORAL

## 2024-01-19 MED ORDER — PRESERVISION AREDS PO CAPS
1.0000 | ORAL_CAPSULE | Freq: Two times a day (BID) | ORAL | Status: DC
  Administered 2024-01-19 – 2024-01-23 (×8): 1 via ORAL
  Filled 2024-01-19 (×10): qty 1

## 2024-01-19 NOTE — Progress Notes (Signed)
 Physical Therapy Weekly Progress Note  Patient Details  Name: EVONTE PRESTAGE MRN: [id number] Date of Birth: [birth year]  Beginning of progress report period: January 13, 2024 End of progress report period: January 19, 2024  Today's Date: 01/19/2024 PT Individual Time: 1308-6578 PT Individual Time Calculation (min): 73 min   Patient has met 4 of 4 short term goals. Pt making great progress towards functional goals. Pt currently requires min assist for transfers with RW with min cues for maintaining sternal precautions, gait with RW >175' with CGA. Pt education has been initiated with pt wife however formal family training scheduled for Tuesday, 4/8. Pt has trialed AFO however demonstrates breakdown of gait mechanics with AFO, improved gait mechanics without.  Patient continues to demonstrate the following deficits muscle weakness, decreased cardiorespiratoy endurance, unbalanced muscle activation and decreased coordination,  , decreased attention, decreased awareness, decreased safety awareness, and decreased memory, and decreased standing balance, decreased postural control, hemiplegia, and decreased balance strategies and therefore will continue to benefit from skilled PT intervention to increase functional independence with mobility.  Patient progressing toward long term goals..  Continue plan of care.  PT Short Term Goals Week 1:  PT Short Term Goal 1 (Week 1): Pt will roll side to side w/ min A. PT Short Term Goal 1 - Progress (Week 1): Met PT Short Term Goal 2 (Week 1): Pt will transfer sit to stand w/ mod A consistently. PT Short Term Goal 2 - Progress (Week 1): Met PT Short Term Goal 3 (Week 1): Pt will transfer SPT bed <> w/c w/ mod A consistently. PT Short Term Goal 3 - Progress (Week 1): Met PT Short Term Goal 4 (Week 1): PT will assess gait as able. PT Short Term Goal 4 - Progress (Week 1): Met Week 2:  PT Short Term Goal 1 (Week 2): STG = LTG due to ELOS  Skilled Therapeutic  Interventions/Progress Updates:  Pt presents in room in recliner agreeable to PT. Pt wife present for session, education initiated. Pt denies pain. Session focused on therapeutic activities for family education on training with pt wife on assisting with bed mobility, transfers and gait, as well as gait training for negotiating steps, gait without device, and for multidirectional stepping stability.  Pt completes transfers with therapist and then wife providing cues for "nose over toes" and cues for hand placement to maintain sternal precautions, completes with min assist. Pt ambulates in room to/from bathroom with therapist then with pt wife providing CGA, pt wife demonstrating good guarding technique and also demonstrating good verbal cues to pt for RLE foot clearance and proximity to RW. Pt then ambulates ~200' from room to main gym with pt wife providing CGA, therapist bringing WC in tow. Pt ambulates to steps, completes up/down 8 3" steps with CGA with BHRs and cues for foot placement as pt demonstrating LLE midline shift. Pt ambulates back to [facility] for seated rest break.  Therapist trial AFO for RLE to improve RLE foot clearance and gait mechanics. Pt ambulates 95' with CGA pt demonstrating excessive RLE hip ER and decreased foot clearance with decreased knee flexion noted. AFO doffed and pt continues gait training without AFO for remainder of session with improving gait mechanics. Pt ambulates 95' without device with CGA/min assist, demonstrating decreased stance time RLE, trendelenberg stance with R stance phase however pt demonstrates improving upright posture.  Pt then compeltes gait training for side stepping to promote hip strengthening for RLE as well as RLE coordination, completed in //bars  3x8' with light UE support on //bar.  Pt returns to room dependenlty via WC, completes ambulatory transfer to bathroom. Pt manages 3/3 toileting tasks with supervision, completes transfer with min assist for  sitting and standing with cues for body mechanics. Pt ambulates to sink for hand hygiene, then returns to sitting in [facility] where he remains with all needs within reach, cal light in place and wife at bedside at end of session.    Ambulation/gait training;Community reintegration;Neuromuscular re-education;Stair training;Psychosocial support;UE/LE Strength taining/ROM;Balance/vestibular training;Discharge planning;Therapeutic Activities;UE/LE Coordination activities;Therapeutic Exercise;Splinting/orthotics;Patient/family education;Functional mobility training   Therapy Documentation Precautions:  Precautions Precautions: Fall, Sternal Precaution/Restrictions Comments: R hemi Restrictions Weight Bearing Restrictions Per Provider Order: Yes RUE Weight Bearing Per Provider Order: Non weight bearing LUE Weight Bearing Per Provider Order: Non weight bearing Other Position/Activity Restrictions: sternal  Therapy/Group: Individual Therapy  Edwin Cap PT, DPT 01/19/2024, 4:59 PM

## 2024-01-19 NOTE — Progress Notes (Signed)
 Speech Language Pathology Weekly Progress and Session Note  Patient Details  Name: JAMEL HOLZMANN MRN: [id number] Date of Birth: [birth year]  Beginning of progress report period: January 13, 2024 End of progress report period: January 19, 2024  Today's Date: 01/19/2024 SLP Individual Time: 8413-2440 SLP Individual Time Calculation (min): 35 min  Short Term Goals: Week 1: SLP Short Term Goal 1 (Week 1): Patient will utilize swallowing compensatory strategies during consumption of regular/thin diet with min multimodal A SLP Short Term Goal 1 - Progress (Week 1): Met SLP Short Term Goal 2 (Week 1): Patient will demonstrate problem solving skills in mildly complex situations given min multimodal A SLP Short Term Goal 2 - Progress (Week 1): Met  New Short Term Goals: Week 2: SLP Short Term Goal 1 (Week 2): STGs=LTGs d/t ELOS  Weekly Progress Updates: Patient is making excellent progress towards therapy goals, meeting 2/2 short term goals set for this reporting period. Patient currently tolerates regular/thin liquid diet without overt difficulty. During mildly complex problem solving situations, patient benefits from min assist and incorporation of visual aids to assist with working memory and executive functioning skills. Patient and family education ongoing. Patient will continue to benefit from skilled therapy services during remainder of CIR stay.    Intensity: Minumum of 1-2 x/day, 30 to 90 minutes Frequency: 1 to 3 out of 7 days Duration/Length of Stay: 4/10 Treatment/Interventions: Cognitive remediation/compensation;Dysphagia/aspiration precaution training;Internal/external aids;Cueing hierarchy;Therapeutic Activities;Functional tasks;Patient/family education  Daily Session  Skilled Therapeutic Interventions: SLP conducted skilled therapy session targeting cognitive retraining goals. SLP targeted mildly complex visuospatial problem solving skills requiring organization, reasoning, and  sequencing. Completed task x2 with different prompts each time, requiring one cue across both versions of task for organization and extra processing time. During task, patient alternated attention to conversation with SLP and back to task with modI. In remaining minutes of session, SLP facilitated mildly complex card sorting task requiring patient to organize cards into four piles based on number and color. SLP provided patient with visual working memory aid. Patient independently formed organization method to break task down into manageable parts. Patient heavily reliant on visual aid to recall task parameters, however with use of this aid, min cues, and extra time, patient completed task with 100% accuracy. Patient was left in chair with call bell in reach and chair alarm set. SLP will continue to target goals per plan of care.      Pain Pain Assessment Pain Scale: 0-10 Pain Score: 0-No pain  Therapy/Group: Individual Therapy  Jeannie Done, M.A., CCC-SLP  [name] 01/19/2024, 9:53 AM

## 2024-01-19 NOTE — Plan of Care (Signed)
 The following goals upgraded due to patient's progress in both strength, balance, and endurance:  Problem: RH Balance Goal: LTG Patient will maintain dynamic standing balance (PT) Description: LTG:  Patient will maintain dynamic standing balance with assistance during mobility activities (PT) Flowsheets (Taken 01/19/2024 1703) LTG: Pt will maintain dynamic standing balance during mobility activities with:: Supervision/Verbal cueing   Problem: Sit to Stand Goal: LTG:  Patient will perform sit to stand with assistance level (PT) Description: LTG:  Patient will perform sit to stand with assistance level (PT) Flowsheets (Taken 01/19/2024 1703) LTG: PT will perform sit to stand in preparation for functional mobility with assistance level: Supervision/Verbal cueing   Problem: RH Bed to Chair Transfers Goal: LTG Patient will perform bed/chair transfers w/assist (PT) Description: LTG: Patient will perform bed to chair transfers with assistance (PT). Flowsheets (Taken 01/19/2024 1703) LTG: Pt will perform Bed to Chair Transfers with assistance level: Supervision/Verbal cueing   Problem: RH Ambulation Goal: LTG Patient will ambulate in controlled environment (PT) Description: LTG: Patient will ambulate in a controlled environment, # of feet with assistance (PT). Flowsheets (Taken 01/19/2024 1703) LTG: Pt will ambulate in controlled environ  assist needed:: Supervision/Verbal cueing LTG: Ambulation distance in controlled environment: 150' Goal: LTG Patient will ambulate in home environment (PT) Description: LTG: Patient will ambulate in home environment, # of feet with assistance (PT). Flowsheets (Taken 01/19/2024 1703) LTG: Pt will ambulate in home environ  assist needed:: Supervision/Verbal cueing

## 2024-01-19 NOTE — Progress Notes (Signed)
     [address]       [place] [address]             [phone]       Patient: Home Provider: Office Consent for Telemedicine visit obtained.  Today's visit was completed via a real-time telehealth (see specific modality noted below). The patient/authorized person provided oral consent at the time of the visit to engage in a telemedicine encounter with the present provider at [facility]. The patient/authorized person was informed of the potential benefits, limitations, and risks of telemedicine. The patient/authorized person expressed understanding that the laws that protect confidentiality also apply to telemedicine. The patient/authorized person acknowledged understanding that telemedicine does not provide emergency services and that he or she would need to call 911 or proceed to the nearest hospital for help if such a need arose.   Total time spent in the clinical discussion 10 minutes.  Telehealth Modality: Phone visit (audio only)  I had a telephone visit with  Tyler David who is s/p CABG.  Overall doing well.  Pain is minimal.  Ambulating well. Vitals have been stable.  Tyler David will see [place] back in 1 month with a chest x-ray for cardiac rehab clearance.  Tyler David

## 2024-01-19 NOTE — Progress Notes (Signed)
 PROGRESS NOTE   Subjective/Complaints: No events overnight.  Sleeping much better.  Has notable improvement in postnasal drip with Claritin and Flonase.  He does endorse some itching on his back at nighttime, resolved with as needed Benadryl.  No obvious rash at this time.  No orthostasis with increase in Lasix; educated patient on monitoring for this over the weekend  Wife wishing to resume his home PreserVision supplements  ROS: Denies fevers, chills, N/V, abdominal pain, constipation, diarrhea, SOB, cough, chest pain, new weakness or paraesthesias.   + Insomnia--intermittent, ongoing + Allergic rhinitis--improved  Objective:   No results found.  Recent Labs    01/18/24 0622  WBC 10.8*  HGB 9.8*  HCT 29.7*  PLT 401*   Recent Labs    01/18/24 0622  NA 139  K 3.7  CL 105  CO2 24  GLUCOSE 93  BUN 30*  CREATININE 2.37*  CALCIUM 8.0*    Intake/Output Summary (Last 24 hours) at 01/19/2024 1455 Last data filed at 01/19/2024 0500 Gross per 24 hour  Intake 420 ml  Output 400 ml  Net 20 ml        Physical Exam: Vital Signs Blood pressure (!) 135/53, pulse 67, temperature 98.3 F (36.8 C), resp. rate 18, height 5\' 8"  (1.727 m), weight 83.9 kg, SpO2 97%.  Constitutional: No apparent distress. Appropriate appearance for age.  In bedside chair, eating breakfast.   HENT: No JVD. Neck Supple. Trachea midline. Atraumatic, normocephalic.  No obvious erythema or discharge from nare. Eyes: PERRLA. EOMI. Visual fields grossly intact.  Cardiovascular: RRR, no murmurs/rub/gallops trace right lower extremity edema, in TED hose  Respiratory: CTAB. No rales, rhonchi, or wheezing. On RA.  Abdomen: + bowel sounds, normoactive. No distention or tenderness.   Skin: Vein harvesting site from right lower extremity appears clean, dry, well-approximated.  No surrounding erythema or warmth.--Unchanged  MSK:      No apparent  deformity.   Neurologic exam:  Awake, alert, oriented x 3. Some mild cognitive delay and memory issues--improving No dysarthria, language intact Cranial nerves II through XII grossly intact Strength: Moving all 4 extremities antigravity against resistance; 4 out of 5 throughout, slightly weaker in right upper and lower extremity Light touch: Intact in all 4 extremities Reflexes: DTR's brisk and 2+ in all 4's. Toes up right foot.      Assessment/Plan: 1. Functional deficits which require 3+ hours per day of interdisciplinary therapy in a comprehensive inpatient rehab setting. Physiatrist is providing close team supervision and 24 hour management of active medical problems listed below. Physiatrist and rehab team continue to assess barriers to discharge/monitor patient progress toward functional and medical goals  Care Tool:  Bathing    Body parts bathed by patient: Right arm, Left arm, Chest, Abdomen, Front perineal area, Buttocks, Right upper leg, Left upper leg, Right lower leg, Left lower leg, Face   Body parts bathed by helper: Right lower leg, Left lower leg     Bathing assist Assist Level: Contact Guard/Touching assist     Upper Body Dressing/Undressing Upper body dressing   What is the patient wearing?: Pull over shirt    Upper body assist Assist Level: Set up  assist    Lower Body Dressing/Undressing Lower body dressing      What is the patient wearing?: Underwear/pull up, Pants     Lower body assist Assist for lower body dressing: Minimal Assistance - Patient > 75%     Toileting Toileting    Toileting assist Assist for toileting: Minimal Assistance - Patient > 75% (in standing)     Transfers Chair/bed transfer  Transfers assist     Chair/bed transfer assist level: Moderate Assistance - Patient 50 - 74%     Locomotion Ambulation   Ambulation assist   Ambulation activity did not occur: Safety/medical concerns          Walk 10 feet  activity   Assist  Walk 10 feet activity did not occur: Safety/medical concerns        Walk 50 feet activity   Assist Walk 50 feet with 2 turns activity did not occur: Safety/medical concerns         Walk 150 feet activity   Assist Walk 150 feet activity did not occur: Safety/medical concerns         Walk 10 feet on uneven surface  activity   Assist Walk 10 feet on uneven surfaces activity did not occur: Safety/medical concerns         Wheelchair     Assist Is the patient using a wheelchair?: Yes Type of Wheelchair: Manual    Wheelchair assist level: Dependent - Patient 0%      Wheelchair 50 feet with 2 turns activity    Assist        Assist Level: Dependent - Patient 0%   Wheelchair 150 feet activity     Assist      Assist Level: Dependent - Patient 0%   Blood pressure (!) 135/53, pulse 67, temperature 98.3 F (36.8 C), resp. rate 18, height 5\' 8"  (1.727 m), weight 83.9 kg, SpO2 97%.  Medical Problem List and Plan: 1. 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  Sternal precautions             -patient may  shower             -ELOS/Goals: 18-21 days, goals supervision to min assist with PT and OT and mod I to supervision with SLP - 4/10 DC date   - Continue CIR, team conference tomorrow  4/1: Mod-Max LB ADLs, limited by weakness. Needs BSC and tub transfer bench. Fatigue improving; Mod A bed mobnility and transfers, walks 60 ft RW with CGA/Min A. May meet SPV goals. Has some decreased insight and c/b sternal precautions requiring frequent cues. Targetting complex tasks for SLP.  2.  Antithrombotics: -DVT/anticoagulation:  Pharmaceutical: Eliquis             -antiplatelet therapy: Aspirin 81 mg daily  -3-30: Bilateral lower extremity duplex ordered for right lower extremity increased edema, pain.  Negative for DVT right lower extremity  3. Pain Management:  Oxycodone as needed 4. Mood/Behavior/Sleep: Provide emotional support             -antipsychotic agents: N/A             -pt with persistent fatigue             -record sleep chart             -melatonin prn insomnia  3-29: Patient refused as needed melatonin overnight; encouraged use as needed  3-30: Slept much better with melatonin  3-31 increase melatonin to 5 mg--with benefit per patient  4-2: Schedule melatonin 5 mg nightly.  Add as needed Benadryl 25 mg nightly.--much improved  5. Neuropsych/cognition: This patient is capable of making decisions on his own behalf. 6. Skin/Wound Care: Routine skin checks  -4/4: Sensation of itching on back, without obvious rash or excoriations.  May be related to bedding in the hospital.  Responsive to as needed Benadryl, added as needed hydrocortisone 0.5% cream twice daily for local symptoms  7. Fluids/Electrolytes/Nutrition: Routine in and outs with follow-up chemistries on Monday             -encourage PO   - BMP Qmon, thurs  8.  Postoperative atrial fibrillation.  Amiodarone 400 mg twice daily as well as Lopressor 12.5 mg twice daily.  Will discuss plan to decrease dosage  -Regular rhythm.  9.  Acute blood loss anemia.  Latest hemoglobin 9.8.  Follow-up CBC  -3-4 9: Admission hemoglobin 9.5; stable  10.  Hypertension/HFrEF.  Norvasc 10 mg daily. Lopressor 12.5 m BID.  Monitor with increased mobility  -01-12-29: Moderately hypertensive; some permission given recent CVA.  Monitor for now.  3-30: Remains hypertensive, increasing peripheral edema, discharge meds include Lasix 40 mg daily--held on rehab admission.  Will start daily weights and resume Lasix at 20 mg daily. Last BNP per chart review 570; will add to AM labs.  -3/31 weights overall stable, intermittently hypertensive. Continue lasix, BNP up to 1263  1/4: Blood pressure is improving, weight downtrending since resumption of Lasix--increase back to home dose of 40 mg daily.  4/2-4: Blood  pressure improved, weight downtrending.  Monitor for orthostasis.    01/19/2024    2:36 PM 01/19/2024    6:50 AM 01/19/2024    5:00 AM  Vitals with BMI  Weight  184 lbs 15 oz --  BMI  28.13   Systolic 135    Diastolic 53    Pulse 67     Filed Weights   01/18/24 0600 01/18/24 1837 01/19/24 0650  Weight: 85.5 kg 85.5 kg 83.9 kg    11.  Hyperlipidemia.  Zetia 12.  Hypothyroidism.  Synthroid 13.  CKD stage III.  Creatinine baseline 1.98-2.41             -f/u labs Monday   3-29: Admission labs stable, creatinine down to 2.08  3/31 creatinine a little higher at 2.22  4/3: Cr 2.37, within his normal, repeat in 2 days and consider decrease in Lasix if needed  4-4: Repeat BMP in a.m.  14.  GERD.  Protonix  15.  Leukocytosis.  Mild, 12.2 today from 10.5 prior.  Monitor for systemic signs of illness and repeat labs Monday.  -3-30: No fevers, no concerning signs of infection.  Will check duplex in case of right lower extremity DVT due to increasing edema in this leg--likely just from vein harvesting  3/31 WBC down to 9.2  4/1: labs in AM--slightly increased at 10.8, staying in range, monitor for signs of infection  16.  Allergic rhinitis. -Claritin 10 mg daily -Flonase 1 spray into each nare daily -As needed Benadryl  4-4: Symptoms improved.  15.  Macular degeneration.  PreserVision capsules 1 cap twice daily resumed.  LOS: 7 days A FACE TO FACE EVALUATION WAS PERFORMED  Angelina Sheriff 01/19/2024, 2:55 PM

## 2024-01-19 NOTE — Plan of Care (Signed)
 Goals downgraded 4/4 due to decreased carryover of sternal precautions during functional activities.   Problem: RH Balance Goal: LTG Patient will maintain dynamic standing with ADLs (OT) Description: LTG:  Patient will maintain dynamic standing balance with assist during activities of daily living (OT)  Flowsheets (Taken 01/19/2024 1302) LTG: Pt will maintain dynamic standing balance during ADLs with: Supervision/Verbal cueing   Problem: Sit to Stand Goal: LTG:  Patient will perform sit to stand in prep for activites of daily living with assistance level (OT) Description: LTG:  Patient will perform sit to stand in prep for activites of daily living with assistance level (OT) Flowsheets (Taken 01/19/2024 1302) LTG: PT will perform sit to stand in prep for activites of daily living with assistance level: Supervision/Verbal cueing   Problem: RH Bathing Goal: LTG Patient will bathe all body parts with assist levels (OT) Description: LTG: Patient will bathe all body parts with assist levels (OT) Flowsheets (Taken 01/19/2024 1302) LTG: Pt will perform bathing with assistance level/cueing: Supervision/Verbal cueing   Problem: RH Dressing Goal: LTG Patient will perform lower body dressing w/assist (OT) Description: LTG: Patient will perform lower body dressing with assist, with/without cues in positioning using equipment (OT) Flowsheets (Taken 01/19/2024 1302) LTG: Pt will perform lower body dressing with assistance level of: Supervision/Verbal cueing   Problem: RH Toileting Goal: LTG Patient will perform toileting task (3/3 steps) with assistance level (OT) Description: LTG: Patient will perform toileting task (3/3 steps) with assistance level (OT)  Flowsheets (Taken 01/19/2024 1302) LTG: Pt will perform toileting task (3/3 steps) with assistance level: Supervision/Verbal cueing   Problem: RH Toilet Transfers Goal: LTG Patient will perform toilet transfers w/assist (OT) Description: LTG: Patient  will perform toilet transfers with assist, with/without cues using equipment (OT) Flowsheets (Taken 01/19/2024 1302) LTG: Pt will perform toilet transfers with assistance level of: Supervision/Verbal cueing

## 2024-01-19 NOTE — Progress Notes (Signed)
 Met with patient and wife to review current situation, team conferences and plan of carte. Reviewed medications and dietary recommendations of secondary risks including HTN, HLD. Discussed DAPT eliquis and asa. Wife asked for discount application for eliquis and was provided. Instructed to fill up application.  Continue to follow along to provide educational needs to facilitate preparation for discharge.

## 2024-01-20 DIAGNOSIS — R600 Localized edema: Secondary | ICD-10-CM

## 2024-01-20 LAB — BASIC METABOLIC PANEL WITH GFR
Anion gap: 7 (ref 5–15)
BUN: 28 mg/dL — ABNORMAL HIGH (ref 8–23)
CO2: 26 mmol/L (ref 22–32)
Calcium: 8.2 mg/dL — ABNORMAL LOW (ref 8.9–10.3)
Chloride: 108 mmol/L (ref 98–111)
Creatinine, Ser: 2.51 mg/dL — ABNORMAL HIGH (ref 0.61–1.24)
GFR, Estimated: 26 mL/min — ABNORMAL LOW (ref >60)
Glucose, Bld: 91 mg/dL (ref 70–99)
Potassium: 3.5 mmol/L (ref 3.5–5.1)
Sodium: 141 mmol/L (ref 135–145)

## 2024-01-20 MED ORDER — FUROSEMIDE 20 MG PO TABS
20.0000 mg | ORAL_TABLET | Freq: Every day | ORAL | Status: DC
  Administered 2024-01-21: 20 mg via ORAL
  Filled 2024-01-20: qty 1

## 2024-01-20 NOTE — Progress Notes (Signed)
 PROGRESS NOTE   Subjective/Complaints: No acute events overnight noted.  Continues to be sleeping better.  Reports had shower this morning and he is happy with this.  Wife feels that right leg swelling is improved.   ROS: Denies fevers, chills, N/V, abdominal pain, constipation, diarrhea, SOB, cough, chest pain, new weakness or paraesthesias.   + Insomnia--improved + Allergic rhinitis--improved  Objective:   No results found.  Recent Labs    01/18/24 0622  WBC 10.8*  HGB 9.8*  HCT 29.7*  PLT 401*   Recent Labs    01/18/24 0622 01/20/24 0637  NA 139 141  K 3.7 3.5  CL 105 108  CO2 24 26  GLUCOSE 93 91  BUN 30* 28*  CREATININE 2.37* 2.51*  CALCIUM 8.0* 8.2*    Intake/Output Summary (Last 24 hours) at 01/20/2024 1255 Last data filed at 01/20/2024 0345 Gross per 24 hour  Intake 300 ml  Output 925 ml  Net -625 ml        Physical Exam: Vital Signs Blood pressure 139/65, pulse 68, temperature 98.7 F (37.1 C), resp. rate 18, height 5\' 8"  (1.727 m), weight 82.9 kg, SpO2 94%.  Constitutional: No apparent distress. Appropriate appearance for age.  Sitting in chair, pleasant HENT: No JVD. Neck Supple. Trachea midline. Atraumatic, normocephalic.  No obvious erythema or discharge from nare. Eyes: PERRLA. EOMI. Visual fields grossly intact.  Cardiovascular: RRR, no murmurs/rub/gallops trace right lower extremity edema, in TED hose  Respiratory: CTAB. No rales, rhonchi, or wheezing. On RA.  Abdomen: + bowel sounds, normoactive. No distention or tenderness.   Skin: Vein harvesting site from right lower extremity appears clean, dry, well-approximated.  No surrounding erythema or warmth.--Unchanged  MSK:      No apparent deformity.   Neurologic exam:  Awake, alert, oriented x 3. Some mild cognitive delay and memory issues--improving No dysarthria, language intact Cranial nerves II through XII grossly  intact Strength: Moving all 4 extremities antigravity against resistance; 4 out of 5 throughout, slightly weaker in right upper and lower extremity Light touch: Intact in all 4 extremities Reflexes: DTR's brisk and 2+ in all 4's. Toes up right foot. Exam stable from prior 4/5     Assessment/Plan: 1. Functional deficits which require 3+ hours per day of interdisciplinary therapy in a comprehensive inpatient rehab setting. Physiatrist is providing close team supervision and 24 hour management of active medical problems listed below. Physiatrist and rehab team continue to assess barriers to discharge/monitor patient progress toward functional and medical goals  Care Tool:  Bathing    Body parts bathed by patient: Right arm, Left arm, Chest, Abdomen, Front perineal area, Buttocks, Right upper leg, Left upper leg, Right lower leg, Left lower leg, Face   Body parts bathed by helper: Right lower leg, Left lower leg     Bathing assist Assist Level: Contact Guard/Touching assist     Upper Body Dressing/Undressing Upper body dressing   What is the patient wearing?: Pull over shirt    Upper body assist Assist Level: Set up assist    Lower Body Dressing/Undressing Lower body dressing      What is the patient wearing?: Underwear/pull up, Pants  Lower body assist Assist for lower body dressing: Minimal Assistance - Patient > 75%     Toileting Toileting    Toileting assist Assist for toileting: Minimal Assistance - Patient > 75% (in standing)     Transfers Chair/bed transfer  Transfers assist     Chair/bed transfer assist level: Moderate Assistance - Patient 50 - 74%     Locomotion Ambulation   Ambulation assist   Ambulation activity did not occur: Safety/medical concerns          Walk 10 feet activity   Assist  Walk 10 feet activity did not occur: Safety/medical concerns        Walk 50 feet activity   Assist Walk 50 feet with 2 turns activity did  not occur: Safety/medical concerns         Walk 150 feet activity   Assist Walk 150 feet activity did not occur: Safety/medical concerns         Walk 10 feet on uneven surface  activity   Assist Walk 10 feet on uneven surfaces activity did not occur: Safety/medical concerns         Wheelchair     Assist Is the patient using a wheelchair?: Yes Type of Wheelchair: Manual    Wheelchair assist level: Dependent - Patient 0%      Wheelchair 50 feet with 2 turns activity    Assist        Assist Level: Dependent - Patient 0%   Wheelchair 150 feet activity     Assist      Assist Level: Dependent - Patient 0%   Blood pressure 139/65, pulse 68, temperature 98.7 F (37.1 C), resp. rate 18, height 5\' 8"  (1.727 m), weight 82.9 kg, SpO2 94%.  Medical Problem List and Plan: 1. 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  Sternal precautions             -patient may  shower             -ELOS/Goals: 18-21 days, goals supervision to min assist with PT and OT and mod I to supervision with SLP - 4/10 DC date  4/1: Mod-Max LB ADLs, limited by weakness. Needs BSC and tub transfer bench. Fatigue improving; Mod A bed mobnility and transfers, walks 60 ft RW with CGA/Min A. May meet SPV goals. Has some decreased insight and c/b sternal precautions requiring frequent cues. Targetting complex tasks for SLP.  - Continue CIR  2.  Antithrombotics: -DVT/anticoagulation:  Pharmaceutical: Eliquis             -antiplatelet therapy: Aspirin 81 mg daily  -3-30: Bilateral lower extremity duplex ordered for right lower extremity increased edema, pain.  Negative for DVT right lower extremity  4-5 wife reports right lower extremity edema is a little better 3. Pain Management: Oxycodone as needed 4. Mood/Behavior/Sleep: Provide emotional support             -antipsychotic agents: N/A             -pt with  persistent fatigue             -record sleep chart             -melatonin prn insomnia  3-29: Patient refused as needed melatonin overnight; encouraged use as needed  3-30: Slept much better with melatonin  3-31 increase melatonin to 5 mg--with benefit per patient  4-2: Schedule melatonin 5 mg nightly.  Add as needed Benadryl  25 mg nightly.--much improved  4/5 reports sleeping better, continue current  5. Neuropsych/cognition: This patient is capable of making decisions on his own behalf. 6. Skin/Wound Care: Routine skin checks  -4/4: Sensation of itching on back, without obvious rash or excoriations.  May be related to bedding in the hospital.  Responsive to as needed Benadryl, added as needed hydrocortisone 0.5% cream twice daily for local symptoms  7. Fluids/Electrolytes/Nutrition: Routine in and outs with follow-up chemistries on Monday             -encourage PO   - BMP Qmon, thurs  8.  Postoperative atrial fibrillation.  Amiodarone 400 mg twice daily as well as Lopressor 12.5 mg twice daily.  Will discuss plan to decrease dosage  -Regular rhythm.  9.  Acute blood loss anemia.  Latest hemoglobin 9.8.  Follow-up CBC  -3-4 9: Admission hemoglobin 9.5; stable  10.  Hypertension/HFrEF.  Norvasc 10 mg daily. Lopressor 12.5 m BID.  Monitor with increased mobility  -01-12-29: Moderately hypertensive; some permission given recent CVA.  Monitor for now.  3-30: Remains hypertensive, increasing peripheral edema, discharge meds include Lasix 40 mg daily--held on rehab admission.  Will start daily weights and resume Lasix at 20 mg daily. Last BNP per chart review 570; will add to AM labs.  -3/31 weights overall stable, intermittently hypertensive. Continue lasix, BNP up to 1263  1/4: Blood pressure is improving, weight downtrending since resumption of Lasix--increase back to home dose of 40 mg daily.  4/2-4: Blood pressure improved, weight downtrending.  Monitor for orthostasis.  4/5 BP stable  continue to monitor, weight a little lower     01/20/2024    5:18 AM 01/20/2024    5:00 AM 01/19/2024    7:32 PM  Vitals with BMI  Weight  182 lbs 12 oz   BMI  27.8   Systolic 139  149  Diastolic 65  57  Pulse 68  69   Filed Weights   01/18/24 1837 01/19/24 0650 01/20/24 0500  Weight: 85.5 kg 83.9 kg 82.9 kg    11.  Hyperlipidemia.  Zetia 12.  Hypothyroidism.  Synthroid 13.  CKD stage III.  Creatinine baseline 1.98-2.41             -f/u labs Monday   3-29: Admission labs stable, creatinine down to 2.08  3/31 creatinine a little higher at 2.22  4/3: Cr 2.37, within his normal, repeat in 2 days and consider decrease in Lasix if needed  4-4: Repeat BMP in a.m.  4-5 creatinine a little higher at 2.51, BUN a little lower at 28.  Will decrease Lasix to 20 mg daily  14.  GERD.  Protonix  15.  Leukocytosis.  Mild, 12.2 today from 10.5 prior.  Monitor for systemic signs of illness and repeat labs Monday.  -3-30: No fevers, no concerning signs of infection.  Will check duplex in case of right lower extremity DVT due to increasing edema in this leg--likely just from vein harvesting  3/31 WBC down to 9.2  4/1: labs in AM--slightly increased at 10.8, staying in range, monitor for signs of infection  16.  Allergic rhinitis. -Claritin 10 mg daily -Flonase 1 spray into each nare daily -As needed Benadryl  4-4: Symptoms improved.  15.  Macular degeneration.  PreserVision capsules 1 cap twice daily resumed.  LOS: 8 days A FACE TO FACE EVALUATION WAS PERFORMED  Fanny Dance 01/20/2024, 12:55 PM

## 2024-01-21 NOTE — Plan of Care (Signed)
  Problem: Consults Goal: RH STROKE PATIENT EDUCATION Description: See Patient Education module for education specifics  Outcome: Progressing   Problem: RH BOWEL ELIMINATION Goal: RH STG MANAGE BOWEL WITH ASSISTANCE Description: STG Manage Bowel with minimal  Assistance. Outcome: Progressing   Problem: RH BLADDER ELIMINATION Goal: RH STG MANAGE BLADDER WITH ASSISTANCE Description:  Manage Bladder with minimal   Assistance Outcome: Progressing   Problem: RH SKIN INTEGRITY Goal: RH STG SKIN FREE OF INFECTION/BREAKDOWN Description: Manage skin free of infection with minimal assistance Outcome: Progressing   Problem: RH SAFETY Goal: RH STG ADHERE TO SAFETY PRECAUTIONS W/ASSISTANCE/DEVICE Description: STG Adhere to Safety Precautions With minimal Assistance/Device. Outcome: Progressing   Problem: RH COGNITION-NURSING Goal: RH STG USES MEMORY AIDS/STRATEGIES W/ASSIST TO PROBLEM SOLVE Description: STG Uses Memory Aids/Strategies With minimal Assistance to Problem Solve. Outcome: Progressing   Problem: RH PAIN MANAGEMENT Goal: RH STG PAIN MANAGED AT OR BELOW PT'S PAIN GOAL Description: <4 w/  prns Outcome: Progressing   Problem: RH KNOWLEDGE DEFICIT Goal: RH STG INCREASE KNOWLEDGE OF DIABETES Description:  Manage increase  knowledge of diabetes with minimal assistance using educational materials provided  Outcome: Progressing

## 2024-01-21 NOTE — Progress Notes (Addendum)
 PROGRESS NOTE   Subjective/Complaints: No acute events noted overnight.  He is disappointed that [facility] lost last night.  Discussed lab results regarding creatinine and BUN.  ROS: Denies fevers, N/V, abdominal pain, constipation, diarrhea, SOB, cough, chest pain, new rash, new weakness or paraesthesias.   + Insomnia--improved + Allergic rhinitis--improved  Objective:   No results found.  No results for input(s): "WBC", "HGB", "HCT", "PLT" in the last 72 hours.  Recent Labs    01/20/24 0637  NA 141  K 3.5  CL 108  CO2 26  GLUCOSE 91  BUN 28*  CREATININE 2.51*  CALCIUM 8.2*    Intake/Output Summary (Last 24 hours) at 01/21/2024 1305 Last data filed at 01/21/2024 0738 Gross per 24 hour  Intake 558 ml  Output 550 ml  Net 8 ml        Physical Exam: Vital Signs Blood pressure (!) 134/51, pulse 67, temperature 98 F (36.7 C), temperature source Oral, resp. rate 18, height 5\' 8"  (1.727 m), weight 83.7 kg, SpO2 97%.  Constitutional: No apparent distress.  Appears comfortable, sitting in wheelchair, pleasant HENT: No JVD. Neck Supple. Trachea midline. Atraumatic, normocephalic.  No obvious erythema or discharge from nare. Eyes: PERRLA. EOMI. Visual fields grossly intact.  Cardiovascular: RRR, in TED hose  Respiratory: CTAB. No rales, rhonchi, or wheezing. On RA.  Abdomen: + bowel sounds, normoactive. No distention or tenderness.   Skin: Vein harvesting site from right lower extremity appears clean, dry, well-approximated.  No surrounding erythema or warmth.--Not visualized today 4/6  MSK:      No apparent deformity.   Neurologic exam:  Awake, alert, oriented x 3. Some mild cognitive delay and memory issues--improving No dysarthria, language intact Cranial nerves II through XII grossly intact Strength: Moving all 4 extremities antigravity against resistance; 4 out of 5 throughout, slightly weaker in right upper and  lower extremity Light touch: Intact in all 4 extremities  Exam stable from prior 4/6     Assessment/Plan: 1. Functional deficits which require 3+ hours per day of interdisciplinary therapy in a comprehensive inpatient rehab setting. Physiatrist is providing close team supervision and 24 hour management of active medical problems listed below. Physiatrist and rehab team continue to assess barriers to discharge/monitor patient progress toward functional and medical goals  Care Tool:  Bathing    Body parts bathed by patient: Right arm, Left arm, Chest, Abdomen, Front perineal area, Buttocks, Right upper leg, Left upper leg, Right lower leg, Left lower leg, Face   Body parts bathed by helper: Right lower leg, Left lower leg     Bathing assist Assist Level: Contact Guard/Touching assist     Upper Body Dressing/Undressing Upper body dressing   What is the patient wearing?: Pull over shirt    Upper body assist Assist Level: Set up assist    Lower Body Dressing/Undressing Lower body dressing      What is the patient wearing?: Underwear/pull up, Pants     Lower body assist Assist for lower body dressing: Minimal Assistance - Patient > 75%     Toileting Toileting    Toileting assist Assist for toileting: Minimal Assistance - Patient > 75% (in standing)  Transfers Chair/bed transfer  Transfers assist     Chair/bed transfer assist level: Moderate Assistance - Patient 50 - 74%     Locomotion Ambulation   Ambulation assist   Ambulation activity did not occur: Safety/medical concerns          Walk 10 feet activity   Assist  Walk 10 feet activity did not occur: Safety/medical concerns        Walk 50 feet activity   Assist Walk 50 feet with 2 turns activity did not occur: Safety/medical concerns         Walk 150 feet activity   Assist Walk 150 feet activity did not occur: Safety/medical concerns         Walk 10 feet on uneven surface   activity   Assist Walk 10 feet on uneven surfaces activity did not occur: Safety/medical concerns         Wheelchair     Assist Is the patient using a wheelchair?: Yes Type of Wheelchair: Manual    Wheelchair assist level: Dependent - Patient 0%      Wheelchair 50 feet with 2 turns activity    Assist        Assist Level: Dependent - Patient 0%   Wheelchair 150 feet activity     Assist      Assist Level: Dependent - Patient 0%   Blood pressure (!) 134/51, pulse 67, temperature 98 F (36.7 C), temperature source Oral, resp. rate 18, height 5\' 8"  (1.727 m), weight 83.7 kg, SpO2 97%.  Medical Problem List and Plan: 1. 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  Sternal precautions             -patient may  shower             -ELOS/Goals: 18-21 days, goals supervision to min assist with PT and OT and mod I to supervision with SLP - 4/10 DC date  4/1: Mod-Max LB ADLs, limited by weakness. Needs BSC and tub transfer bench. Fatigue improving; Mod A bed mobnility and transfers, walks 60 ft RW with CGA/Min A. May meet SPV goals. Has some decreased insight and c/b sternal precautions requiring frequent cues. Targetting complex tasks for SLP.  - Continue CIR  2.  Antithrombotics: -DVT/anticoagulation:  Pharmaceutical: Eliquis             -antiplatelet therapy: Aspirin 81 mg daily  -3-30: Bilateral lower extremity duplex ordered for right lower extremity increased edema, pain.  Negative for DVT right lower extremity  4-5-6 wife reports right lower extremity edema is a little better, continue to monitor  3. Pain Management: Oxycodone as needed 4. Mood/Behavior/Sleep: Provide emotional support             -antipsychotic agents: N/A             -pt with persistent fatigue             -record sleep chart             -melatonin prn insomnia  3-29: Patient refused as needed melatonin overnight;  encouraged use as needed  3-30: Slept much better with melatonin  3-31 increase melatonin to 5 mg--with benefit per patient  4-2: Schedule melatonin 5 mg nightly.  Add as needed Benadryl 25 mg nightly.--much improved  4/6 insomnia remains improved continue current regimen for now  5. Neuropsych/cognition: This patient is capable of making decisions on his own behalf. 6. Skin/Wound Care: Routine  skin checks  -4/4: Sensation of itching on back, without obvious rash or excoriations.  May be related to bedding in the hospital.  Responsive to as needed Benadryl, added as needed hydrocortisone 0.5% cream twice daily for local symptoms  7. Fluids/Electrolytes/Nutrition: Routine in and outs with follow-up chemistries on Monday             -encourage PO   - BMP Qmon, thurs  8.  Postoperative atrial fibrillation.  Amiodarone 400 mg twice daily as well as Lopressor 12.5 mg twice daily.  Will discuss plan to decrease dosage  -Regular rhythm.  9.  Acute blood loss anemia.  Latest hemoglobin 9.8.  Follow-up CBC  -3-4 9: Admission hemoglobin 9.5; stable  Recheck tomorrow  10.  Hypertension/HFrEF.  Norvasc 10 mg daily. Lopressor 12.5 m BID.  Monitor with increased mobility  -01-12-29: Moderately hypertensive; some permission given recent CVA.  Monitor for now.  3-30: Remains hypertensive, increasing peripheral edema, discharge meds include Lasix 40 mg daily--held on rehab admission.  Will start daily weights and resume Lasix at 20 mg daily. Last BNP per chart review 570; will add to AM labs.  -3/31 weights overall stable, intermittently hypertensive. Continue lasix, BNP up to 1263  1/4: Blood pressure is improving, weight downtrending since resumption of Lasix--increase back to home dose of 40 mg daily.  4/2-4: Blood pressure improved, weight downtrending.  Monitor for orthostasis.  4/5-6 BP stable continue to monitor, weight a little lower last couple days     01/21/2024    4:42 AM 01/21/2024    4:40 AM  01/20/2024    8:35 PM  Vitals with BMI  Weight 184 lbs 8 oz    BMI 28.06    Systolic  134 156  Diastolic  51 67  Pulse  67 72   Filed Weights   01/19/24 0650 01/20/24 0500 01/21/24 0442  Weight: 83.9 kg 82.9 kg 83.7 kg    11.  Hyperlipidemia.  Zetia 12.  Hypothyroidism.  Synthroid 13.  CKD stage III.  Creatinine baseline 1.98-2.41             -f/u labs Monday   3-29: Admission labs stable, creatinine down to 2.08  3/31 creatinine a little higher at 2.22  4/3: Cr 2.37, within his normal, repeat in 2 days and consider decrease in Lasix if needed  4-4: Repeat BMP in a.m.  4-5 creatinine a little higher at 2.51, BUN a little lower at 28.  Will decrease Lasix to 20 mg daily  4/6 weight and edema appear to be doing a little better, will stop Lasix for tomorrow, he should have labs complete tomorrow a.m. and this can help guide plan going forward  14.  GERD.  Protonix  15.  Leukocytosis.  Mild, 12.2 today from 10.5 prior.  Monitor for systemic signs of illness and repeat labs Monday.  -3-30: No fevers, no concerning signs of infection.  Will check duplex in case of right lower extremity DVT due to increasing edema in this leg--likely just from vein harvesting  3/31 WBC down to 9.2  4/1: labs in AM--slightly increased at 10.8, staying in range, monitor for signs of infection  Recheck tomorrow   16.  Allergic rhinitis. -Claritin 10 mg daily -Flonase 1 spray into each nare daily -As needed Benadryl  4-4: Symptoms improved.  15.  Macular degeneration.  PreserVision capsules 1 cap twice daily resumed.  LOS: 9 days A FACE TO FACE EVALUATION WAS PERFORMED  Fanny Dance 01/21/2024, 1:05  PM

## 2024-01-22 LAB — BASIC METABOLIC PANEL WITH GFR
Anion gap: 8 (ref 5–15)
BUN: 29 mg/dL — ABNORMAL HIGH (ref 8–23)
CO2: 26 mmol/L (ref 22–32)
Calcium: 8.3 mg/dL — ABNORMAL LOW (ref 8.9–10.3)
Chloride: 107 mmol/L (ref 98–111)
Creatinine, Ser: 2.43 mg/dL — ABNORMAL HIGH (ref 0.61–1.24)
GFR, Estimated: 27 mL/min — ABNORMAL LOW (ref >60)
Glucose, Bld: 99 mg/dL (ref 70–99)
Potassium: 4.2 mmol/L (ref 3.5–5.1)
Sodium: 141 mmol/L (ref 135–145)

## 2024-01-22 MED ORDER — DICLOFENAC SODIUM 1 % EX GEL
2.0000 g | Freq: Four times a day (QID) | CUTANEOUS | Status: DC
  Administered 2024-01-22 – 2024-01-23 (×4): 2 g via TOPICAL
  Filled 2024-01-22: qty 100

## 2024-01-22 MED ORDER — FUROSEMIDE 20 MG PO TABS
20.0000 mg | ORAL_TABLET | Freq: Every day | ORAL | Status: DC
  Administered 2024-01-22 – 2024-01-23 (×2): 20 mg via ORAL
  Filled 2024-01-22 (×2): qty 1

## 2024-01-22 MED ORDER — BISACODYL 5 MG PO TBEC: 10.0000 mg | DELAYED_RELEASE_TABLET | Freq: Every day | ORAL | Status: DC

## 2024-01-22 MED ORDER — DOCUSATE SODIUM 100 MG PO CAPS
200.0000 mg | ORAL_CAPSULE | Freq: Every day | ORAL | Status: DC
Start: 2024-01-23 — End: 2024-06-19

## 2024-01-22 NOTE — Progress Notes (Signed)
 PROGRESS NOTE   Subjective/Complaints: Systolic hypertension overnight 140s to 150s; otherwise vital stable A.m. labs significant for stable BUN/creatinine, CBC pending. Continent of bowel bladder, last bowel movement 4-5  Patient noting new pain on the dorsal part of his right foot today.  Denies any trauma.  Is very sensitive to palpation.  Sleeping better overall  ROS: Denies fevers, N/V, abdominal pain, constipation, diarrhea, SOB, cough, chest pain, new rash, new weakness or paraesthesias.   + Insomnia--improved + Allergic rhinitis--improved + Right dorsal foot pain  Objective:   No results found.  No results for input(s): "WBC", "HGB", "HCT", "PLT" in the last 72 hours.  Recent Labs    01/20/24 0637 01/22/24 0537  NA 141 141  K 3.5 4.2  CL 108 107  CO2 26 26  GLUCOSE 91 99  BUN 28* 29*  CREATININE 2.51* 2.43*  CALCIUM 8.2* 8.3*    Intake/Output Summary (Last 24 hours) at 01/22/2024 0748 Last data filed at 01/22/2024 0405 Gross per 24 hour  Intake 237 ml  Output 550 ml  Net -313 ml        Physical Exam: Vital Signs Blood pressure (!) 143/63, pulse 72, temperature 98.1 F (36.7 C), resp. rate 18, height 5\' 8"  (1.727 m), weight 83.4 kg, SpO2 98%.  Constitutional: No apparent distress.  Appears comfortable, sitting in wheelchair, pleasant HENT: No JVD. Neck Supple. Trachea midline. Atraumatic, normocephalic.  No obvious erythema or discharge from nare. Eyes: PERRLA. EOMI. Visual fields grossly intact.  Cardiovascular: RRR, bilateral lower extremity edema 2+ right lower extremity, 1+ left lower extremity.  Respiratory: CTAB. No rales, rhonchi, or wheezing. On RA.  Abdomen: + bowel sounds, normoactive. No distention or tenderness.   Skin: Chest scar well-approximated/stable.  Vein harvesting site from right lower extremity appears clean, dry, well-approximated.  No surrounding erythema or warmth    MSK:      No apparent deformity.  Exquisitely tender to palpation over right dorsal foot, with some edema and bruising present.  No deformity or crepitus.  No pain with range of motion.  Neurologic exam:  Awake, alert, oriented x 3. Some mild cognitive delay and memory issues--improving No dysarthria, language intact Cranial nerves II through XII grossly intact Strength: Moving all 4 extremities antigravity against resistance; 4 out of 5 throughout, slightly weaker in right upper and lower extremity except right lower extremity dorsiflexion 2 out of 5, plantarflexion 4 out of 5 Light touch: Intact in all 4 extremities      Assessment/Plan: 1. Functional deficits which require 3+ hours per day of interdisciplinary therapy in a comprehensive inpatient rehab setting. Physiatrist is providing close team supervision and 24 hour management of active medical problems listed below. Physiatrist and rehab team continue to assess barriers to discharge/monitor patient progress toward functional and medical goals  Care Tool:  Bathing    Body parts bathed by patient: Right arm, Left arm, Chest, Abdomen, Front perineal area, Buttocks, Right upper leg, Left upper leg, Right lower leg, Left lower leg, Face   Body parts bathed by helper: Right lower leg, Left lower leg     Bathing assist Assist Level: Contact Guard/Touching assist     Upper  Body Dressing/Undressing Upper body dressing   What is the patient wearing?: Pull over shirt    Upper body assist Assist Level: Set up assist    Lower Body Dressing/Undressing Lower body dressing      What is the patient wearing?: Underwear/pull up, Pants     Lower body assist Assist for lower body dressing: Minimal Assistance - Patient > 75%     Toileting Toileting    Toileting assist Assist for toileting: Minimal Assistance - Patient > 75% (in standing)     Transfers Chair/bed transfer  Transfers assist     Chair/bed transfer assist  level: Moderate Assistance - Patient 50 - 74%     Locomotion Ambulation   Ambulation assist   Ambulation activity did not occur: Safety/medical concerns          Walk 10 feet activity   Assist  Walk 10 feet activity did not occur: Safety/medical concerns        Walk 50 feet activity   Assist Walk 50 feet with 2 turns activity did not occur: Safety/medical concerns         Walk 150 feet activity   Assist Walk 150 feet activity did not occur: Safety/medical concerns         Walk 10 feet on uneven surface  activity   Assist Walk 10 feet on uneven surfaces activity did not occur: Safety/medical concerns         Wheelchair     Assist Is the patient using a wheelchair?: Yes Type of Wheelchair: Manual    Wheelchair assist level: Dependent - Patient 0%      Wheelchair 50 feet with 2 turns activity    Assist        Assist Level: Dependent - Patient 0%   Wheelchair 150 feet activity     Assist      Assist Level: Dependent - Patient 0%   Blood pressure (!) 143/63, pulse 72, temperature 98.1 F (36.7 C), resp. rate 18, height 5\' 8"  (1.727 m), weight 83.4 kg, SpO2 98%.  Medical Problem List and Plan: 1. 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  Sternal precautions             -patient may  shower             -ELOS/Goals: 18-21 days, goals supervision to min assist with PT and OT and mod I to supervision with SLP - 4/10 DC date  4/1: Mod-Max LB ADLs, limited by weakness. Needs BSC and tub transfer bench. Fatigue improving; Mod A bed mobnility and transfers, walks 60 ft RW with CGA/Min A. May meet SPV goals. Has some decreased insight and c/b sternal precautions requiring frequent cues. Targetting complex tasks for SLP.  - Continue CIR  2.  Antithrombotics: -DVT/anticoagulation:  Pharmaceutical: Eliquis             -antiplatelet therapy: Aspirin 81 mg  daily  -3-30: Bilateral lower extremity duplex ordered for right lower extremity increased edema, pain.  Negative for DVT right lower extremity  4-5-6 wife reports right lower extremity edema is a little better, continue to monitor   3. Pain Management: Oxycodone as needed  4-7: New pain on right dorsal foot, add Voltaren gel 4 times daily.  4. Mood/Behavior/Sleep: Provide emotional support             -antipsychotic agents: N/A             -pt with  persistent fatigue             -record sleep chart             -melatonin prn insomnia  3-29: Patient refused as needed melatonin overnight; encouraged use as needed  3-30: Slept much better with melatonin  3-31 increase melatonin to 5 mg--with benefit per patient  4-2: Schedule melatonin 5 mg nightly.  Add as needed Benadryl 25 mg nightly.--much improved  4/6 insomnia remains improved continue current regimen for now  5. Neuropsych/cognition: This patient is capable of making decisions on his own behalf. 6. Skin/Wound Care: Routine skin checks  -4/4: Sensation of itching on back, without obvious rash or excoriations.  May be related to bedding in the hospital.  Responsive to as needed Benadryl, added as needed hydrocortisone 0.5% cream twice daily for local symptoms  7. Fluids/Electrolytes/Nutrition: Routine in and outs with follow-up chemistries on Monday             -encourage PO   - BMP Qmon, thurs  8.  Postoperative atrial fibrillation.  Amiodarone 400 mg twice daily as well as Lopressor 12.5 mg twice daily.  Will discuss plan to decrease dosage  -Regular rhythm.  9.  Acute blood loss anemia.  Latest hemoglobin 9.8.  Follow-up CBC  -3-4 9: Admission hemoglobin 9.5; stable  4-7: CBC pending.  10.  Hypertension/HFrEF.  Norvasc 10 mg daily. Lopressor 12.5 m BID.  Monitor with increased mobility  -01-12-29: Moderately hypertensive; some permission given recent CVA.  Monitor for now.  3-30: Remains hypertensive, increasing peripheral  edema, discharge meds include Lasix 40 mg daily--held on rehab admission.  Will start daily weights and resume Lasix at 20 mg daily. Last BNP per chart review 570; will add to AM labs.  -3/31 weights overall stable, intermittently hypertensive. Continue lasix, BNP up to 1263  1/4: Blood pressure is improving, weight downtrending since resumption of Lasix--increase back to home dose of 40 mg daily.  4/2-4: Blood pressure improved, weight downtrending.  Monitor for orthostasis.  4/5-6 BP stable continue to monitor, weight a little lower last couple days   4-7: Blood pressure slightly elevated overnight, peripheral edema increased, resume Lasix 20 mg daily    01/22/2024    4:45 AM 01/22/2024    4:10 AM 01/21/2024    7:51 PM  Vitals with BMI  Weight 183 lbs 14 oz    BMI 27.96    Systolic  143 159  Diastolic  63 61  Pulse  72 73   Filed Weights   01/20/24 0500 01/21/24 0442 01/22/24 0445  Weight: 82.9 kg 83.7 kg 83.4 kg    11.  Hyperlipidemia.  Zetia 12.  Hypothyroidism.  Synthroid 13.  CKD stage III.  Creatinine baseline 1.98-2.41             -f/u labs Monday   3-29: Admission labs stable, creatinine down to 2.08  3/31 creatinine a little higher at 2.22  4/3: Cr 2.37, within his normal, repeat in 2 days and consider decrease in Lasix if needed  4-4: Repeat BMP in a.m.  4-5 creatinine a little higher at 2.51, BUN a little lower at 28.  Will decrease Lasix to 20 mg daily  4/6 weight and edema appear to be doing a little better, will stop Lasix for tomorrow, he should have labs complete tomorrow a.m. and this can help guide plan going forward  -4/7: BUN, creatinine stable.  Weight appears to have been stable over the last 3  days on checks; resume Lasix 20 mg daily for maintenance.  14.  GERD.  Protonix  15.  Leukocytosis.  Mild, 12.2 today from 10.5 prior.  Monitor for systemic signs of illness and repeat labs Monday.  -3-30: No fevers, no concerning signs of infection.  Will check duplex in  case of right lower extremity DVT due to increasing edema in this leg--likely just from vein harvesting  3/31 WBC down to 9.2  4/1: labs in AM--slightly increased at 10.8, staying in range, monitor for signs of infection  Recheck tomorrow --pending  16.  Allergic rhinitis. -Claritin 10 mg daily -Flonase 1 spray into each nare daily -As needed Benadryl 4-4: Symptoms improved.  15.  Macular degeneration.  PreserVision capsules 1 cap twice daily resumed.  16.  Right dorsal foot pain.  Bruising, no trauma per patient.  Voltaren gel as above.  Consider x-ray if no improvement.  LOS: 10 days A FACE TO FACE EVALUATION WAS PERFORMED  Angelina Sheriff 01/22/2024, 7:48 AM

## 2024-01-22 NOTE — Progress Notes (Signed)
 Patient ID: Tyler David, male   DOB: [birth year], 78 y.o.   MRN: [id number]  Per medical team, pt requests to leave earlier.   1419- SW left message for pt wife to discuss above. Waiting on follow-up.  *SW received return phone call from pt wife to discuss above. She reports pt is sleeping so her phone was on silent. She is ok with discharge on Wednesday. Reports she has DME- RW and shower seat with back. Outpatient referral tor [facility] for PT/SLP. SW reviewed discharge process.   Cecile Sheerer, MSW, LCSW Office: [phone] Cell: [phone] Fax: [phone]

## 2024-01-22 NOTE — Progress Notes (Signed)
 Occupational Therapy Session Note  Patient Details  Name: Tyler David MRN: [id number] Date of Birth: 1946/07/20  Today's Date: 01/22/2024 OT Individual Time: 1100-1200 OT Individual Time Calculation (min): 60 min  and Today's Date: 01/22/2024 OT Missed Time: 15 Minutes Missed Time Reason: Patient fatigue   Short Term Goals: Week 1:  OT Short Term Goal 1 (Week 1): Pt will perform toilet transfers with consistent Min A + LRAD. OT Short Term Goal 1 - Progress (Week 1): Met OT Short Term Goal 2 (Week 1): Pt will thread LB garments with supervision + LRAD. OT Short Term Goal 2 - Progress (Week 1): Progressing toward goal OT Short Term Goal 3 (Week 1): Pt will tolerate therapuetic activities >2 mins with decreased SOB. OT Short Term Goal 3 - Progress (Week 1): Met Week 2:  OT Short Term Goal 1 (Week 2): STGs=LTGs due to patient's estimated length of stay.      Skilled Therapeutic Interventions/Progress Updates:    Pt received in w/c ready for therapy. This therapist was 15 min late due to being late coming from another session and had planned to treat pt until 12:15.  At noon, pt stated he was exhausted from lack of sleep and anxious to get back to the room as his wife was arriving.  Pt participated extremely well and put in excellent effort in the session. Pt transported to ADL apt with his RW to practice shower stall transfers. Initially he was having some struggle with coming to stand with his legs only to follow his precautions.  Pt recalled to scoot forward and lean forward, but just needed cue to bring foot further back under his knee for improved base of support when rising to stand.  This allowed him to rise in 1 smooth manner with supervision.  Told him the sit to stand should feel effortless and if it is not to reassess his foot placement. Pt able to follow through on these cues with numerous other sit to stands with supervision.   Set up shower stall block to have pt practice  stepping backwards over shower threshold  using RW for support. He was able to do this with supervision and practiced sitting on shower seat. Talked about possible grab bar placement. His shower has a curtain so this will make the ease of getting into the shower easier.  He did well with control of RLE stepping in and out.    Pt was then taken to main gym to work on a circuit exercise routine for RLE strength and endurance. Pt ambulated a 50 ft loop with S and RW, worked on sit to stands (No arms due to sternal prec), and resisted band (red) exercises for RLE.    Pt repeated circuit 2x and then stated he was too exhausted to continue.  He returned to room with wife present. She ambulated him to the bathroom.  Pt and wife asking if they can change dc day from Thursday to Wed as they will finish fam ed Tuesday and pt has not been sleeping. Wife states she if very comfortable assisting him at home.  Told them I would pass along their request to the primary therapists.    Pt in room with wife.   Therapy Documentation Precautions:  Precautions Precautions: Fall, Sternal Precaution/Restrictions Comments: R hemi Restrictions Weight Bearing Restrictions Per Provider Order: No RUE Weight Bearing Per Provider Order: Non weight bearing LUE Weight Bearing Per Provider Order: Non weight bearing Other Position/Activity Restrictions: sternal General:  Vital Signs:   Pain:   ADL: ADL Eating: Supervision/safety Where Assessed-Eating: Bed level, Wheelchair Grooming: Setup, Supervision/safety Where Assessed-Grooming: Wheelchair Upper Body Bathing: Supervision/safety, Setup Where Assessed-Upper Body Bathing: Shower Lower Body Bathing: Moderate assistance Where Assessed-Lower Body Bathing: Shower Upper Body Dressing: Supervision/safety, Setup Where Assessed-Upper Body Dressing: Wheelchair Lower Body Dressing: Maximal assistance Where Assessed-Lower Body Dressing: Wheelchair Toileting: Moderate  assistance, Maximal assistance Where Assessed-Toileting: Toilet, Psychiatrist Transfer: Moderate assistance, Minimal assistance Toilet Transfer Method: Stand pivot Toilet Transfer Equipment: Bedside commode, Grab bars Tub/Shower Transfer: Unable to assess Film/video editor: Moderate assistance, Minimal assistance Film/video editor Method: Warden/ranger: Transfer tub bench   Therapy/Group: Individual Therapy  Reagen Goates 01/22/2024, 9:06 AM

## 2024-01-22 NOTE — Progress Notes (Signed)
 Speech Language Pathology Daily Session Note  Patient Details  Name: Tyler David MRN: [id number] Date of Birth: January 02, 1946  Today's Date: 01/22/2024 SLP Individual Time: 0800-0900 SLP Individual Time Calculation (min): 60 min  Short Term Goals: Week 2: SLP Short Term Goal 1 (Week 2): STGs=LTGs d/t ELOS  Skilled Therapeutic Interventions:   Pt greeted in his room; awake/alert finishing breakfast upon SLP arrival. SLP facilitated a variety of tx tasks targeting cognition. He initially completed a money management task via menu and required only cue x1 to review task and ID mistake. In conversation, he demonstrated adequate reasoning re current cognitive deficits and difference in performance between functional cognitive tasks and structured complex tasks. He also demonstrated adequate carryover of information from prev tx sessions and verbalized understanding of additional education provided re executive functioning and cognitive endurance. Final tx task targeted time calculation, organization, and information processing. With additional processing time, he was able to complete the task @ modI. Of note, frequent throat clear noted w/ and w/o PO intake. However, pt reported increased post nasal drip as of late; anticipate re to seasonal allergies. At the end of tx tasks, he was left in his chair with the alarm set and call light within reach. Recommend cont ST per POC.   Pain  2/3 out of 10 L side pain  Therapy/Group: Individual Therapy  Pati Gallo 01/22/2024, 8:16 AM

## 2024-01-22 NOTE — Progress Notes (Signed)
 Occupational Therapy Session Note  Patient Details  Name: Tyler David MRN: [id number] Date of Birth: Apr 30, 1946  {CHL IP REHAB OT TIME CALCULATIONS:304400400}  {CHL IP REHAB OT TIME CALCULATIONS:304400400}  Short Term Goals: Week 2:  OT Short Term Goal 1 (Week 2): STGs=LTGs due to patient's estimated length of stay.  Skilled Therapeutic Interventions/Progress Updates:   Session 1:   Session 2:   Therapy Documentation Precautions:  Precautions Precautions: Fall, Sternal Precaution/Restrictions Comments: R hemi Restrictions Weight Bearing Restrictions Per Provider Order: No RUE Weight Bearing Per Provider Order: Non weight bearing LUE Weight Bearing Per Provider Order: Non weight bearing Other Position/Activity Restrictions: sternal   Therapy/Group: Individual Therapy  Lou Cal, OTR/L, MSOT  01/22/2024, 8:57 PM

## 2024-01-22 NOTE — Progress Notes (Signed)
 Physical Therapy Session Note  Patient Details  Name: Tyler David MRN: [id number] Date of Birth: 07/07/1946  Today's Date: 01/22/2024 PT Individual Time: 0920-1015 PT Individual Time Calculation (min): 55 min   Short Term Goals: Week 2:  PT Short Term Goal 1 (Week 2): STG = LTG due to ELOS  Skilled Therapeutic Interventions/Progress Updates:    Pt presents in room in [facility], agreeable to PT. Pt reporting slight pain in dorsum of R foot with pt demonstrating red spot at reported tender spot, tender to palpation. Session focused on gait training and therapeutic exercise for BLE strengthening needed for functional transfers and ambulation.  Therapist dons TED hose total assist, improve swelling noted in RLE however continues to demonstrate swelling in R foot. Pt demonstrating improved plantar flexor strength and digit extension however continues to demonstrate 0/5 DF volitionally. Pt completes transfer to RW with min assist, progress to CGA throughout session with cues for anterior wt shift and foot positioning.  Pt ambulates forward/backward 3x10' with RW CGA with pt demonstrating significant improvement in RLE hip extensor strength with pt demonstrating step through gait pattern with backwards ambulation with 100% foot clearance with task compared to previous sessions.  Pt ambulates without device ~70' with pt demonstrating antalgic gait, reporting pain with weightbearing on RLE requires CGA/min assist for postural stability. Pt ambulates to nustep to transfer.  Pt completes interval training on nustep BLEs only at level 2, 5 minutes working, 45 second rest break, 3 minutes working. Pt with mod verbal/tactile cues for neutral hip has pt demonstrating excessive RLE hip ER with fatigue. Pt reports no pain during activity.  Pt ambulates without device back to mat, demonstrating antalgic gait with RLE stance phase. Pt completes therex to promote RLE strengtheningn and muscle fiber recruitment needed  for functional transfers and ambulation including: - seated hip adduction isometric squeezing ball x20 - sit<>stands squeezing ball x10 - hip flexion RLE x15 - heel slide RLE x15 - hamstring curl RLE x15 BUE support on RW - squats x10 (cues for increasing depth) - standing hip flexion/abduction BLE against yellow band x10 (BUE support on RW) - seated clamshell yellow band x20 - standing heel raise BLE x15 BUE support on RW  Pt completes ambulates 175' with RW CGA demonstrating significant improvement in proximity to RW and RLE foot clearance although with occasional decrease in step height. Pt reporting improved pain in R foot with ambulation.  Pt returns to room ambulating with RW with CGA and remains seated in [facility] with all needs within reach, cal light in place and chair alarm activated at end of session.   Therapy Documentation Precautions:  Precautions Precautions: Fall, Sternal Precaution/Restrictions Comments: R hemi Restrictions Weight Bearing Restrictions Per Provider Order: No RUE Weight Bearing Per Provider Order: Non weight bearing LUE Weight Bearing Per Provider Order: Non weight bearing Other Position/Activity Restrictions: sternal   Therapy/Group: Individual Therapy  Edwin Cap PT, DPT 01/22/2024, 10:20 AM

## 2024-01-22 NOTE — Plan of Care (Signed)
  Problem: Consults Goal: RH STROKE PATIENT EDUCATION Description: See Patient Education module for education specifics  Outcome: Progressing   Problem: RH BOWEL ELIMINATION Goal: RH STG MANAGE BOWEL WITH ASSISTANCE Description: STG Manage Bowel with minimal  Assistance. Outcome: Progressing   Problem: RH BLADDER ELIMINATION Goal: RH STG MANAGE BLADDER WITH ASSISTANCE Description:  Manage Bladder with minimal   Assistance Outcome: Progressing

## 2024-01-23 ENCOUNTER — Other Ambulatory Visit (HOSPITAL_COMMUNITY): Payer: Self-pay

## 2024-01-23 LAB — CBC WITH DIFFERENTIAL/PLATELET
Abs Immature Granulocytes: 0.04 10*3/uL (ref 0.00–0.07)
Basophils Absolute: 0.2 10*3/uL — ABNORMAL HIGH (ref 0.0–0.1)
Basophils Relative: 2 %
Eosinophils Absolute: 0.9 10*3/uL — ABNORMAL HIGH (ref 0.0–0.5)
Eosinophils Relative: 9 %
HCT: 34.1 % — ABNORMAL LOW (ref 39.0–52.0)
Hemoglobin: 11.3 g/dL — ABNORMAL LOW (ref 13.0–17.0)
Immature Granulocytes: 0 %
Lymphocytes Relative: 9 %
Lymphs Abs: 0.9 10*3/uL (ref 0.7–4.0)
MCH: 29.9 pg (ref 26.0–34.0)
MCHC: 33.1 g/dL (ref 30.0–36.0)
MCV: 90.2 fL (ref 80.0–100.0)
Monocytes Absolute: 1 10*3/uL (ref 0.1–1.0)
Monocytes Relative: 10 %
Neutro Abs: 7.4 10*3/uL (ref 1.7–7.7)
Neutrophils Relative %: 70 %
Platelets: 480 10*3/uL — ABNORMAL HIGH (ref 150–400)
RBC: 3.78 MIL/uL — ABNORMAL LOW (ref 4.22–5.81)
RDW: 13.9 % (ref 11.5–15.5)
WBC: 10.6 10*3/uL — ABNORMAL HIGH (ref 4.0–10.5)
nRBC: 0 % (ref 0.0–0.2)

## 2024-01-23 MED ORDER — EZETIMIBE 10 MG PO TABS
10.0000 mg | ORAL_TABLET | Freq: Every day | ORAL | 0 refills | Status: DC
Start: 2024-01-23 — End: 2024-02-05
  Filled 2024-01-23: qty 30, 30d supply, fill #0

## 2024-01-23 MED ORDER — LORATADINE 10 MG PO TABS
10.0000 mg | ORAL_TABLET | Freq: Every day | ORAL | 0 refills | Status: DC
  Filled 2024-01-23: qty 30, 30d supply, fill #0

## 2024-01-23 MED ORDER — AMIODARONE HCL 200 MG PO TABS
200.0000 mg | ORAL_TABLET | Freq: Every day | ORAL | 0 refills | Status: DC
  Filled 2024-01-23: qty 30, 30d supply, fill #0

## 2024-01-23 MED ORDER — DICLOFENAC SODIUM 1 % EX GEL
2.0000 g | Freq: Four times a day (QID) | CUTANEOUS | 0 refills | Status: DC
  Filled 2024-01-23: qty 100, 13d supply, fill #0

## 2024-01-23 MED ORDER — APIXABAN 5 MG PO TABS
5.0000 mg | ORAL_TABLET | Freq: Two times a day (BID) | ORAL | 0 refills | Status: DC
  Filled 2024-01-23: qty 60, 30d supply, fill #0

## 2024-01-23 MED ORDER — AMLODIPINE BESYLATE 10 MG PO TABS
10.0000 mg | ORAL_TABLET | Freq: Every day | ORAL | 0 refills | Status: DC
  Filled 2024-01-23: qty 30, 30d supply, fill #0

## 2024-01-23 MED ORDER — NITROGLYCERIN 0.4 MG SL SUBL
0.4000 mg | SUBLINGUAL_TABLET | SUBLINGUAL | 0 refills | Status: AC | PRN
  Filled 2024-01-23: qty 25, 8d supply, fill #0

## 2024-01-23 MED ORDER — LEVOTHYROXINE SODIUM 50 MCG PO TABS
50.0000 ug | ORAL_TABLET | Freq: Every day | ORAL | 0 refills | Status: DC
Start: 2024-01-23 — End: 2024-06-19
  Filled 2024-01-23: qty 30, 30d supply, fill #0

## 2024-01-23 MED ORDER — MELATONIN 5 MG PO TABS
5.0000 mg | ORAL_TABLET | Freq: Every day | ORAL | 0 refills | Status: DC
  Filled 2024-01-23: qty 30, 30d supply, fill #0

## 2024-01-23 MED ORDER — VITAMIN D3 25 MCG (1000 UNIT) PO TABS
1000.0000 [IU] | ORAL_TABLET | Freq: Every day | ORAL | 0 refills | Status: AC
  Filled 2024-01-23: qty 30, 30d supply, fill #0

## 2024-01-23 MED ORDER — FUROSEMIDE 40 MG PO TABS
40.0000 mg | ORAL_TABLET | Freq: Every day | ORAL | 0 refills | Status: DC
  Filled 2024-01-23: qty 30, 30d supply, fill #0

## 2024-01-23 MED ORDER — OXYCODONE HCL 5 MG PO TABS
5.0000 mg | ORAL_TABLET | ORAL | 0 refills | Status: DC | PRN
Start: 2024-01-23 — End: 2024-02-05
  Filled 2024-01-23: qty 30, 3d supply, fill #0

## 2024-01-23 MED ORDER — FAMOTIDINE 20 MG PO TABS
20.0000 mg | ORAL_TABLET | Freq: Every evening | ORAL | 0 refills | Status: DC
  Filled 2024-01-23: qty 30, 30d supply, fill #0

## 2024-01-23 MED ORDER — METOPROLOL TARTRATE 25 MG PO TABS
12.5000 mg | ORAL_TABLET | Freq: Two times a day (BID) | ORAL | 0 refills | Status: DC
  Filled 2024-01-23: qty 60, 60d supply, fill #0

## 2024-01-23 NOTE — Progress Notes (Signed)
 Patient ID: Tyler David, male   DOB: January 08, 1946, 78 y.o.   MRN: [id number]  SW met with pt and pt wife in room to provide updates from team conference, and d/c date 4/10. Pt wife reports there is access to a shower seat. Will confirm DME- BSC and TTB.  Fam edu on Tuesday 9am-12pmCecile Sheerer, MSW, LCSW Office: [phone] Cell: [phone] Fax: [phone]

## 2024-01-23 NOTE — Progress Notes (Signed)
 Occupational Therapy Discharge Summary  Patient Details  Name: Tyler David MRN: [id number] Date of Birth: 25-Jul-1946  Date of Discharge from OT service:January 23, 2024   Patient has met 7 of 7 long term goals due to improved activity tolerance, improved balance, postural control, ability to compensate for deficits, functional use of  RIGHT upper, RIGHT lower, and LEFT upper extremity, improved attention, improved awareness, and improved coordination.  Patient to discharge at overall Supervision level.  Patient's care partner is independent to provide the necessary physical and cognitive assistance at discharge.    Reasons goals not met: Pt continues to require intermediate Min A for LB care, education provided on AE and modified techniques to address deficits. Spouse  willing to assist with care as needed.   Recommendation:  No OT-follow needed at this time.   Equipment: Gaffer  Reasons for discharge: discharge from hospital  Patient/family agrees with progress made and goals achieved: Yes  OT Discharge Precautions/Restrictions  Precautions Precautions: Fall;Sternal Recall of Precautions/Restrictions: Impaired Precaution/Restrictions Comments: R hemi Restrictions Weight Bearing Restrictions Per Provider Order: No ADL ADL Eating: Independent Where Assessed-Eating: Chair Grooming: Modified independent Where Assessed-Grooming: Sitting at sink Upper Body Bathing: Modified independent Where Assessed-Upper Body Bathing: Shower Lower Body Bathing: Minimal assistance Where Assessed-Lower Body Bathing: Shower Upper Body Dressing: Modified independent (Device) Where Assessed-Upper Body Dressing: Wheelchair Lower Body Dressing: Minimal assistance Where Assessed-Lower Body Dressing: Wheelchair Toileting: Supervision/safety Where Assessed-Toileting: Teacher, adult education: Close supervision Toilet Transfer Method: Proofreader: Other  (comment) (RW) Tub/Shower Transfer: Unable to Engineer, technical sales: Close supervision Film/video editor Method: Designer, industrial/product: Information systems manager with back Vision Baseline Vision/History: 1 Wears glasses Patient Visual Report: No change from baseline Vision Assessment?: No apparent visual deficits Perception  Perception: Within Functional Limits Praxis Praxis: WFL Cognition Cognition Overall Cognitive Status: Within Functional Limits for tasks assessed Arousal/Alertness: Awake/alert Memory: Appears intact Memory Impairment: Storage deficit Attention: Sustained Sustained Attention: Appears intact Sustained Attention Impairment: Functional complex Awareness: Appears intact Problem Solving: Impaired Problem Solving Impairment: Verbal complex;Functional complex Safety/Judgment: Appears intact Brief Interview for Mental Status (BIMS) Repetition of Three Words (First Attempt): 3 Temporal Orientation: Year: Correct Temporal Orientation: Month: Accurate within 5 days Temporal Orientation: Day: Correct Recall: "Sock": Yes, no cue required Recall: "Blue": Yes, no cue required Recall: "Bed": Yes, no cue required BIMS Summary Score: 15 Sensation Sensation Light Touch: Appears Intact Peripheral sensation comments: Reports of residual paresthesia in R hand since carpal tunnel surgery. Light Touch Impaired Details: Impaired RUE Hot/Cold: Appears Intact Proprioception: Impaired by gross assessment (RLE impairement improving) Stereognosis: Appears Intact Coordination Gross Motor Movements are Fluid and Coordinated: No Fine Motor Movements are Fluid and Coordinated: No Coordination and Movement Description: R-hemi Motor  Motor Motor: Hemiplegia Motor - Discharge Observations: RLE weakness, significantly improved from eval Mobility  Bed Mobility Bed Mobility: Supine to Sit;Sit to Supine Supine to Sit: Supervision/Verbal cueing Sit to Supine:  Supervision/Verbal cueing Transfers Sit to Stand: Supervision/Verbal cueing Stand to Sit: Supervision/Verbal cueing  Trunk/Postural Assessment  Cervical Assessment Cervical Assessment: Exceptions to [facility] (Forward head) Thoracic Assessment Thoracic Assessment: Exceptions to [facility] (rounded shoulders) Lumbar Assessment Lumbar Assessment: Exceptions to [facility] (posterior pelvic tilt) Postural Control Postural Control: Deficits on evaluation Righting Reactions: Delayed Protective Responses: Delayed Postural Limitations: decreased  Balance Balance Balance Assessed: Yes Static Sitting Balance Static Sitting - Balance Support: Feet supported Static Sitting - Level of Assistance: 7: Independent Dynamic Sitting Balance Dynamic Sitting - Balance  Support: Feet supported;During functional activity Dynamic Sitting - Level of Assistance: 5: Stand by assistance Static Standing Balance Static Standing - Balance Support: During functional activity;No upper extremity supported Static Standing - Level of Assistance: 5: Stand by assistance Dynamic Standing Balance Dynamic Standing - Balance Support: During functional activity;Bilateral upper extremity supported Dynamic Standing - Level of Assistance: 5: Stand by assistance Extremity/Trunk Assessment RUE Assessment RUE Assessment: Exceptions to [facility] General Strength Comments: 3-/5 RUE Body System: Neuro Brunstrum levels for arm and hand: Arm;Hand Brunstrum level for arm: Stage V Relative Independence from Synergy Brunstrum level for hand: Stage VI Isolated joint movements LUE Assessment LUE Assessment: Exceptions to [facility] General Strength Comments: 3-/5; Mild dysmetria   Lou Cal, OTR/L, MSOT  01/23/2024, 1:00 PM

## 2024-01-23 NOTE — Plan of Care (Signed)
  Problem: RH Balance Goal: LTG Patient will maintain dynamic sitting balance (PT) Description: LTG:  Patient will maintain dynamic sitting balance with assistance during mobility activities (PT) Outcome: Completed/Met Goal: LTG Patient will maintain dynamic standing balance (PT) Description: LTG:  Patient will maintain dynamic standing balance with assistance during mobility activities (PT) Outcome: Completed/Met   Problem: Sit to Stand Goal: LTG:  Patient will perform sit to stand with assistance level (PT) Description: LTG:  Patient will perform sit to stand with assistance level (PT) Outcome: Completed/Met   Problem: RH Bed Mobility Goal: LTG Patient will perform bed mobility with assist (PT) Description: LTG: Patient will perform bed mobility with assistance, with/without cues (PT). Outcome: Completed/Met   Problem: RH Bed to Chair Transfers Goal: LTG Patient will perform bed/chair transfers w/assist (PT) Description: LTG: Patient will perform bed to chair transfers with assistance (PT). Outcome: Completed/Met   Problem: RH Car Transfers Goal: LTG Patient will perform car transfers with assist (PT) Description: LTG: Patient will perform car transfers with assistance (PT). Outcome: Completed/Met   Problem: RH Furniture Transfers Goal: LTG Patient will perform furniture transfers w/assist (OT/PT) Description: LTG: Patient will perform furniture transfers  with assistance (OT/PT). Outcome: Completed/Met   Problem: RH Ambulation Goal: LTG Patient will ambulate in controlled environment (PT) Description: LTG: Patient will ambulate in a controlled environment, # of feet with assistance (PT). Outcome: Completed/Met Goal: LTG Patient will ambulate in home environment (PT) Description: LTG: Patient will ambulate in home environment, # of feet with assistance (PT). Outcome: Completed/Met

## 2024-01-23 NOTE — Plan of Care (Signed)
  Problem: RH Balance Goal: LTG Patient will maintain dynamic standing with ADLs (OT) Description: LTG:  Patient will maintain dynamic standing balance with assist during activities of daily living (OT)  Outcome: Completed/Met   Problem: Sit to Stand Goal: LTG:  Patient will perform sit to stand in prep for activites of daily living with assistance level (OT) Description: LTG:  Patient will perform sit to stand in prep for activites of daily living with assistance level (OT) Outcome: Completed/Met   Problem: RH Bathing Goal: LTG Patient will bathe all body parts with assist levels (OT) Description: LTG: Patient will bathe all body parts with assist levels (OT) Outcome: Adequate for Discharge   Problem: RH Dressing Goal: LTG Patient will perform upper body dressing (OT) Description: LTG Patient will perform upper body dressing with assist, with/without cues (OT). Outcome: Completed/Met Goal: LTG Patient will perform lower body dressing w/assist (OT) Description: LTG: Patient will perform lower body dressing with assist, with/without cues in positioning using equipment (OT) Outcome: Adequate for Discharge   Problem: RH Toileting Goal: LTG Patient will perform toileting task (3/3 steps) with assistance level (OT) Description: LTG: Patient will perform toileting task (3/3 steps) with assistance level (OT)  Outcome: Completed/Met   Problem: RH Functional Use of Upper Extremity Goal: LTG Patient will use RT/LT upper extremity as a (OT) Description: LTG: Patient will use right/left upper extremity as a stabilizer/gross assist/diminished/nondominant/dominant level with assist, with/without cues during functional activity (OT) Outcome: Completed/Met   Problem: RH Toilet Transfers Goal: LTG Patient will perform toilet transfers w/assist (OT) Description: LTG: Patient will perform toilet transfers with assist, with/without cues using equipment (OT) Outcome: Completed/Met   Problem: RH  Tub/Shower Transfers Goal: LTG Patient will perform tub/shower transfers w/assist (OT) Description: LTG: Patient will perform tub/shower transfers with assist, with/without cues using equipment (OT) Outcome: Completed/Met

## 2024-01-23 NOTE — Progress Notes (Signed)
 Inpatient Rehabilitation Discharge Medication Review by a Pharmacist  A complete drug regimen review was completed for this patient to identify any potential clinically significant medication issues.  High Risk Drug Classes Is patient taking? Indication by Medication  Antipsychotic No   Anticoagulant Yes Apixaban - secondary prevention of stroke  Antibiotic No   Opioid Yes Oxycodone IR - prn acute pain  Antiplatelet Yes Aspirin - secondary prevention of stroke  Hypoglycemics/insulin No   Vasoactive Medication Yes Amlodipine - HTN Lasix - HTN Metoprolol - HTN/Afib Nitroglycerin SL - prn chest pain Amiodarone - Afib  Chemotherapy No   Other Yes Ezetimibe - HLD Levothyroxine - Hypothyroidism Famotidine - GERD Melatonin - prn insomnia     Type of Medication Issue Identified Description of Issue Recommendation(s)  Drug Interaction(s) (clinically significant)     Duplicate Therapy     Allergy     No Medication Administration End Date     Incorrect Dose     Additional Drug Therapy Needed     Significant med changes from prior encounter (inform family/care partners about these prior to discharge).    Other       Clinically significant medication issues were identified that warrant physician communication and completion of prescribed/recommended actions by midnight of the next day:  No  Name of provider notified for urgent issues identified:   Provider Method of Notification:     Pharmacist comments:   Time spent performing this drug regimen review (minutes):  30   Lilly Cove 01/23/2024 9:10 AM

## 2024-01-23 NOTE — Progress Notes (Signed)
 PROGRESS NOTE   Subjective/Complaints: BP looking a bit better, no orthostasis, no concerns today. Wishes to DC early if possible. Getting some more dorsiflexion out of right foot  ROS: Denies fevers, N/V, abdominal pain, constipation, diarrhea, SOB, cough, chest pain, new rash, new weakness or paraesthesias.   + Insomnia--improved + Allergic rhinitis--improved + Right dorsal foot pain--improved  Objective:   No results found.  No results for input(s): "WBC", "HGB", "HCT", "PLT" in the last 72 hours.  Recent Labs    01/22/24 0537  NA 141  K 4.2  CL 107  CO2 26  GLUCOSE 99  BUN 29*  CREATININE 2.43*  CALCIUM 8.3*    Intake/Output Summary (Last 24 hours) at 01/23/2024 1048 Last data filed at 01/23/2024 0751 Gross per 24 hour  Intake 536 ml  Output --  Net 536 ml        Physical Exam: Vital Signs Blood pressure (!) 142/51, pulse 71, temperature 98.2 F (36.8 C), resp. rate 18, height 5\' 8"  (1.727 m), weight 82.4 kg, SpO2 99%.  Constitutional: No apparent distress.  Sitting in therapy gym.  HENT: No JVD. Neck Supple. Trachea midline. Atraumatic, normocephalic.  Eyes: PERRLA. EOMI. Visual fields grossly intact.  Cardiovascular: RRR, bilateral lower extremity edema 1+ right lower extremity, none in left lower extremity.  Respiratory: CTAB. No rales, rhonchi, or wheezing. On RA.  Abdomen: + bowel sounds, normoactive. No distention or tenderness.   Skin: Chest scar well-approximated/stable.  Vein harvesting site from right lower extremity appears clean, dry, well-approximated.  No surrounding erythema or warmth   MSK:      No apparent deformity. Mildly tender to palpation over right dorsal foot, with some edema and bruising present.  No deformity or crepitus.  No pain with range of motion.  Neurologic exam:  Awake, alert, oriented x 3. Some mild cognitive delay and memory issues--improving No dysarthria,  language intact Cranial nerves II through XII grossly intact Strength: Moving all 4 extremities antigravity against resistance; 4 out of 5 throughout, slightly weaker in right upper and lower extremity except right lower extremity dorsiflexion 1-2 out of 5, plantarflexion 4 out of 5--unchanged Light touch: Intact in all 4 extremities    Assessment/Plan: 1. Functional deficits which require 3+ hours per day of interdisciplinary therapy in a comprehensive inpatient rehab setting. Physiatrist is providing close team supervision and 24 hour management of active medical problems listed below. Physiatrist and rehab team continue to assess barriers to discharge/monitor patient progress toward functional and medical goals  Care Tool:  Bathing    Body parts bathed by patient: Right arm, Left arm, Chest, Abdomen, Front perineal area, Buttocks, Right upper leg, Left upper leg, Right lower leg, Left lower leg, Face   Body parts bathed by helper: Right lower leg, Left lower leg     Bathing assist Assist Level: Contact Guard/Touching assist     Upper Body Dressing/Undressing Upper body dressing   What is the patient wearing?: Pull over shirt    Upper body assist Assist Level: Set up assist    Lower Body Dressing/Undressing Lower body dressing      What is the patient wearing?: Underwear/pull up, Pants  Lower body assist Assist for lower body dressing: Minimal Assistance - Patient > 75%     Toileting Toileting    Toileting assist Assist for toileting: Minimal Assistance - Patient > 75% (in standing)     Transfers Chair/bed transfer  Transfers assist     Chair/bed transfer assist level: Moderate Assistance - Patient 50 - 74%     Locomotion Ambulation   Ambulation assist   Ambulation activity did not occur: Safety/medical concerns          Walk 10 feet activity   Assist  Walk 10 feet activity did not occur: Safety/medical concerns        Walk 50 feet  activity   Assist Walk 50 feet with 2 turns activity did not occur: Safety/medical concerns         Walk 150 feet activity   Assist Walk 150 feet activity did not occur: Safety/medical concerns         Walk 10 feet on uneven surface  activity   Assist Walk 10 feet on uneven surfaces activity did not occur: Safety/medical concerns         Wheelchair     Assist Is the patient using a wheelchair?: Yes Type of Wheelchair: Manual    Wheelchair assist level: Dependent - Patient 0%      Wheelchair 50 feet with 2 turns activity    Assist        Assist Level: Dependent - Patient 0%   Wheelchair 150 feet activity     Assist      Assist Level: Dependent - Patient 0%   Blood pressure (!) 142/51, pulse 71, temperature 98.2 F (36.8 C), resp. rate 18, height 5\' 8"  (1.727 m), weight 82.4 kg, SpO2 99%.  Medical Problem List and Plan: 1. 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  Sternal precautions             -patient may  shower             -ELOS/Goals: 18-21 days, goals supervision to min assist with PT and OT and mod I to supervision with SLP -  DC today after therapies complete  4/1: Mod-Max LB ADLs, limited by weakness. Needs BSC and tub transfer bench. Fatigue improving; Mod A bed mobnility and transfers, walks 60 ft RW with CGA/Min A. May meet SPV goals. Has some decreased insight and c/b sternal precautions requiring frequent cues. Targetting complex tasks for SLP.  The patient is medically ready for discharge to home and will need follow-up with [facility] PM[facility]. In addition, they will need to follow up with their PCP, Neurology and cardiology.    2.  Antithrombotics: -DVT/anticoagulation:  Pharmaceutical: Eliquis             -antiplatelet therapy: Aspirin 81 mg daily  -3-30: Bilateral lower extremity duplex ordered for right lower extremity increased edema, pain.  Negative for DVT  right lower extremity  3. Pain Management: Oxycodone as needed  4-7: New pain on right dorsal foot, add Voltaren gel 4 times daily--improved  4. Mood/Behavior/Sleep: Provide emotional support             -antipsychotic agents: N/A             -pt with persistent fatigue             -record sleep chart             -melatonin prn insomnia  3-29: Patient refused  as needed melatonin overnight; encouraged use as needed  3-30: Slept much better with melatonin  3-31 increase melatonin to 5 mg--with benefit per patient  4-2: Schedule melatonin 5 mg nightly.  Add as needed Benadryl 25 mg nightly.--much improved  4/6 insomnia remains improved continue current regimen for now  5. Neuropsych/cognition: This patient is capable of making decisions on his own behalf. 6. Skin/Wound Care: Routine skin checks  -4/4: Sensation of itching on back, without obvious rash or excoriations.  May be related to bedding in the hospital.  Responsive to as needed Benadryl, added as needed hydrocortisone 0.5% cream twice daily for local symptoms  7. Fluids/Electrolytes/Nutrition: Routine in and outs with follow-up chemistries on Monday             -encourage PO   - BMP Qmon, thurs--stable  8.  Postoperative atrial fibrillation.  Amiodarone 400 mg twice daily as well as Lopressor 12.5 mg twice daily.  Will discuss plan to decrease dosage  -Regular rhythm.  9.  Acute blood loss anemia.  Latest hemoglobin 9.8.  Follow-up CBC  -3-4 9: Admission hemoglobin 9.5; stable  4-7: CBC pending--reordered 4/8--HgB improved 11.3  10.  Hypertension/HFrEF.  Norvasc 10 mg daily. Lopressor 12.5 m BID.  Monitor with increased mobility  -01-12-29: Moderately hypertensive; some permission given recent CVA.  Monitor for now.  3-30: Remains hypertensive, increasing peripheral edema, discharge meds include Lasix 40 mg daily--held on rehab admission.  Will start daily weights and resume Lasix at 20 mg daily. Last BNP per chart review 570;  will add to AM labs.  -3/31 weights overall stable, intermittently hypertensive. Continue lasix, BNP up to 1263  1/4: Blood pressure is improving, weight downtrending since resumption of Lasix--increase back to home dose of 40 mg daily.  4/2-4: Blood pressure improved, weight downtrending.  Monitor for orthostasis.  4/5-6 BP stable continue to monitor, weight a little lower last couple days   4-7: Blood pressure slightly elevated overnight, peripheral edema increased, resume Lasix 20 mg daily  4/8: BP stable    01/23/2024    9:00 AM 01/23/2024    5:32 AM 01/23/2024    5:00 AM  Vitals with BMI  Weight   181 lbs 11 oz  BMI   27.63  Systolic 142 135   Diastolic 51 69   Pulse 71 65    Filed Weights   01/21/24 0442 01/22/24 0445 01/23/24 0500  Weight: 83.7 kg 83.4 kg 82.4 kg    11.  Hyperlipidemia.  Zetia 12.  Hypothyroidism.  Synthroid 13.  CKD stage III.  Creatinine baseline 1.98-2.41             -f/u labs Monday   3-29: Admission labs stable, creatinine down to 2.08  3/31 creatinine a little higher at 2.22  4/3: Cr 2.37, within his normal, repeat in 2 days and consider decrease in Lasix if needed  4-4: Repeat BMP in a.m.  4-5 creatinine a little higher at 2.51, BUN a little lower at 28.  Will decrease Lasix to 20 mg daily  4/6 weight and edema appear to be doing a little better, will stop Lasix for tomorrow, he should have labs complete tomorrow a.m. and this can help guide plan going forward  -4/7: BUN, creatinine stable.  Weight appears to have been stable over the last 3 days on checks; resume Lasix 20 mg daily for maintenance.  14.  GERD.  Protonix  15.  Leukocytosis.  Mild, 12.2 today from 10.5 prior.  Monitor  for systemic signs of illness and repeat labs Monday.  -3-30: No fevers, no concerning signs of infection.  Will check duplex in case of right lower extremity DVT due to increasing edema in this leg--likely just from vein harvesting  3/31 WBC down to 9.2  4/1: labs in  AM--slightly increased at 10.8, staying in range, monitor for signs of infection  Recheck tomorrow --pending--reordered for 4/8--stable 10.6  16.  Allergic rhinitis. -Claritin 10 mg daily -Flonase 1 spray into each nare daily -As needed Benadryl 4-4: Symptoms improved.  15.  Macular degeneration.  PreserVision capsules 1 cap twice daily resumed.  16.  Right dorsal foot pain.  Bruising, no trauma per patient.  Voltaren gel as above.  Consider x-ray if no improvement.   - improved with edema management and voltaren  17. Thrombocytosis. Likely reactive. Follow up labs as OP.     LOS: 11 days A FACE TO FACE EVALUATION WAS PERFORMED  Angelina Sheriff 01/23/2024, 10:48 AM

## 2024-01-23 NOTE — Progress Notes (Signed)
 Physical Therapy Discharge Summary  Patient Details  Name: Tyler David MRN: [id number] Date of Birth: [birth year]  Date of Discharge from PT service:January 23, 2024  Today's Date: 01/23/2024 PT Individual Time: 1102-1201 PT Individual Time Calculation (min): 59 min    Patient has met 9 of 9 long term goals due to improved activity tolerance, improved balance, improved postural control, increased strength, increased range of motion, functional use of  right lower extremity, improved attention, improved awareness, and improved coordination.  Patient to discharge at an ambulatory level Supervision.   Patient's care partner is independent to provide the necessary physical and cognitive assistance at discharge.  Reasons goals not met: N/A  Recommendation:  Patient will benefit from ongoing skilled PT services in outpatient setting to continue to advance safe functional mobility, address ongoing impairments in RLE NMR, gait mechanics, dynamic standing balance, and minimize fall risk.  Equipment: No equipment provided  Reasons for discharge: treatment goals met and discharge from hospital  Patient/family agrees with progress made and goals achieved: Yes  PT Discharge Skilled Treatment Interventions: Pt presents in room in recliner, agreeable to PT. Pt wife present for family education throughout session. Session focused on DC planning and education, gait training, and therapeutic exercise to complete as HEP at DC.  Pt completes sit to stand/stand pivot transfers throughout session supervision with RW with cues for foot placement and hand placement. Pt completes transfer and ambulates with RW with supervision, cues for proximity to RW and speed management as well as RLE foot clearance, ambulates >300' to ortho gym. Pt completes car transfer with supervision with cues for positioning and speed management. Pt ambulates up/down ramp with RW CGA, cues for safety with RW management. Pt completes  up/down 5" curb step with RW CGA. Pt completes bed mobility without hospital bed features with supervision.  Pt then completes one set of the following exercises to complete as HEP at DC with pt wife present, educated on safety and cues for exercises:  Access Code: [email or web address] URL: [email or web address] Date: 01/23/2024 Prepared by: Edwin Cap  Exercises - Sit to Stand Without Arm Support  - 1 x daily - 7 x weekly - 3 sets - 10 reps - Standing March with Counter Support  - 1 x daily - 7 x weekly - 3 sets - 20 reps - Heel Raises with Counter Support  - 1 x daily - 7 x weekly - 3 sets - 10 reps - Standing Hamstring Curl with RW  - 1 x daily - 7 x weekly - 3 sets - 10 reps - Backward Walking with Counter Support  - 1 x daily - 7 x weekly - 3 sets - 10 reps  Pt returns to room ambulating with RW with supervision, and remains seated in [facility] with all needs within reach, cal light in place and wife present at bedside at end of session.   Precautions/Restrictions Precautions Precautions: Fall;Sternal Recall of Precautions/Restrictions: Impaired Precaution/Restrictions Comments: R hemi Restrictions Weight Bearing Restrictions Per Provider Order: No Pain Interference Pain Interference Pain Effect on Sleep: 1. Rarely or not at all Pain Interference with Therapy Activities: 1. Rarely or not at all Pain Interference with Day-to-Day Activities: 1. Rarely or not at all Cognition Overall Cognitive Status: Within Functional Limits for tasks assessed Arousal/Alertness: Awake/alert Orientation Level: Oriented X4 Sensation Sensation Light Touch: Appears Intact Hot/Cold: Appears Intact Proprioception: Appears Intact Stereognosis: Appears Intact Motor  Motor Motor: Hemiplegia Motor - Discharge Observations: RLE weakness, significantly improved from eval  Mobility  Bed Mobility Bed Mobility: Supine to Sit;Sit to Supine Supine to Sit: Supervision/Verbal cueing Sit to Supine:  Supervision/Verbal cueing Transfers Transfers: Stand to Sit;Sit to Stand;Stand Pivot Transfers Sit to Stand: Supervision/Verbal cueing Stand to Sit: Supervision/Verbal cueing Stand Pivot Transfers: Supervision/Verbal cueing Stand Pivot Transfer Details: Verbal cues for technique;Verbal cues for precautions/safety;Verbal cues for sequencing;Tactile cues for placement Transfer (Assistive device): Rolling walker Locomotion  Gait Ambulation: Yes Gait Assistance: Supervision/Verbal cueing Gait Distance (Feet): 300 Feet Assistive device: Rolling walker Gait Assistance Details: Verbal cues for technique;Verbal cues for safe use of DME/AE;Verbal cues for gait pattern;Verbal cues for precautions/safety Gait Gait: No Gait velocity: decr Stairs / Additional Locomotion Stairs: No Curb: Contact Guard/Touching assist Wheelchair Mobility Wheelchair Mobility: No  Trunk/Postural Assessment  Cervical Assessment Cervical Assessment: Exceptions to [facility] (Forward head) Thoracic Assessment Thoracic Assessment: Exceptions to [facility] (rounded shoulders) Lumbar Assessment Lumbar Assessment: Exceptions to [facility] (posterior pelvic tilt) Postural Control Postural Control: Deficits on evaluation Righting Reactions: Delayed Protective Responses: Delayed Postural Limitations: decreased  Balance Balance Balance Assessed: Yes Static Sitting Balance Static Sitting - Balance Support: Feet supported Static Sitting - Level of Assistance: 7: Independent Dynamic Sitting Balance Dynamic Sitting - Balance Support: Feet supported;During functional activity Dynamic Sitting - Level of Assistance: 5: Stand by assistance Static Standing Balance Static Standing - Balance Support: During functional activity;No upper extremity supported Static Standing - Level of Assistance: 5: Stand by assistance Dynamic Standing Balance Dynamic Standing - Balance Support: During functional activity;Bilateral upper extremity  supported Dynamic Standing - Level of Assistance: 5: Stand by assistance Extremity Assessment  RLE Assessment RLE Assessment: Exceptions to [facility] General Strength Comments: tested in sitting RLE Strength Right Hip Flexion: 3+/5 Right Hip Extension: 3+/5 Right Knee Flexion: 3+/5 Right Knee Extension: 4-/5 Right Ankle Dorsiflexion: 1/5 Right Ankle Plantar Flexion: 2/5 LLE Assessment LLE Assessment: Within Functional Limits General Strength Comments: grossly 4+/5   Edwin Cap PT, DPT 01/23/2024, 12:32 PM

## 2024-01-23 NOTE — Progress Notes (Signed)
 Speech Language Pathology Discharge Summary  Patient Details  Name: Tyler David MRN: [id number] Date of Birth: Jun 01, 1946  Date of Discharge from SLP service:January 23, 2024  Today's Date: 01/23/2024 SLP Individual Time: 1006-1030 SLP Individual Time Calculation (min): 24 min  Skilled Therapeutic Interventions:  SLP conducted skilled therapy session targeting cognitive retraining goals and family education. Patient reports getting a good amount of sleep last night and indicates that this has increased his thinking and processing speed. SLP discussed patient's lingering deficits, influencing factors, and importance of easing back in to more complex tasks at home. Patient and wife verbalized understanding. Facilitated moderately complex money management task. When utilizing write it down strategy to improve working memory, completed task with modI. Patient is appropriate for discharge. See full summary below.   Patient has met 2 of 2 long term goals.  Patient to discharge at overall Modified Independent level.  Reasons goals not met: n/a   Clinical Impression/Discharge Summary: Patient has made excellent progress across therapy admission and has met 2/2 long term goals set for duration. Patient is currently at modI level for tolerance of regular/thin liquid diet and for mildly complex problem solving tasks. No further ST services indicated at next venue. Patient and family education complete. SLP will sign off.    Care Partner:  Caregiver Able to Provide Assistance: Yes  Type of Caregiver Assistance: Cognitive  Recommendation:  None     Equipment: n/a   Reasons for discharge: Discharged from hospital   Patient/Family Agrees with Progress Made and Goals Achieved: Yes   Jeannie Done, M.A., CCC-SLP  [name] 01/23/2024, 12:53 PM

## 2024-01-23 NOTE — Plan of Care (Signed)
  Problem: Consults Goal: RH STROKE PATIENT EDUCATION Description: See Patient Education module for education specifics  Outcome: Progressing   Problem: RH BOWEL ELIMINATION Goal: RH STG MANAGE BOWEL WITH ASSISTANCE Description: STG Manage Bowel with minimal  Assistance. Outcome: Progressing   Problem: RH BLADDER ELIMINATION Goal: RH STG MANAGE BLADDER WITH ASSISTANCE Description:  Manage Bladder with minimal   Assistance Outcome: Progressing   Problem: RH SKIN INTEGRITY Goal: RH STG SKIN FREE OF INFECTION/BREAKDOWN Description: Manage skin free of infection with minimal assistance Outcome: Progressing   Problem: RH SAFETY Goal: RH STG ADHERE TO SAFETY PRECAUTIONS W/ASSISTANCE/DEVICE Description: STG Adhere to Safety Precautions With minimal Assistance/Device. Outcome: Progressing   Problem: RH COGNITION-NURSING Goal: RH STG USES MEMORY AIDS/STRATEGIES W/ASSIST TO PROBLEM SOLVE Description: STG Uses Memory Aids/Strategies With minimal Assistance to Problem Solve. Outcome: Progressing   Problem: RH PAIN MANAGEMENT Goal: RH STG PAIN MANAGED AT OR BELOW PT'S PAIN GOAL Description: <4 w/  prns Outcome: Progressing   Problem: RH KNOWLEDGE DEFICIT Goal: RH STG INCREASE KNOWLEDGE OF DIABETES Description:  Manage increase  knowledge of diabetes with minimal assistance using educational materials provided  Outcome: Progressing Goal: RH STG INCREASE KNOWLEDGE OF HYPERTENSION Description: Manage increase  knowledge of hypertension with minimal assistance using educational materials provided  Outcome: Progressing Goal: RH STG INCREASE KNOWLEGDE OF HYPERLIPIDEMIA Description: Manage increase  knowledge of hyperlipidemia  with minimal assistance using educational materials provided  Outcome: Progressing Goal: RH STG INCREASE KNOWLEDGE OF STROKE PROPHYLAXIS Description: Manage increase  knowledge of stroke prophylaxis  with minimal assistance using educational materials provided   Outcome: Progressing

## 2024-01-23 NOTE — Patient Care Conference (Signed)
 Inpatient RehabilitationTeam Conference and Plan of Care Update Date: 01/23/2024   Time: 1048 am    Patient Name: Tyler David      Medical Record Number: [id number]  Date of Birth: [birth year] Sex: Male         Room/Bed: 4W12C/4W12C-02 Payor Info: Payor: [employer] / Plan: [employer] PPO / Product Type: *No Product type* /    Admit Date/Time:  01/12/2024  2:17 PM  Primary Diagnosis:  Cardioembolic stroke [facility])  Hospital Problems: Principal Problem:   Cardioembolic stroke [facility])    Expected Discharge Date: Expected Discharge Date: 01/23/24  Team Members Present: Physician leading conference: Dr. Elijah Birk Social Worker Present: Cecile Sheerer, LCSWA Nurse Present: Konrad Dolores, RN PT Present: Darrold Span, PT OT Present: Lou Cal, OT SLP Present: Other (comment) Alvera Novel SLP) PPS Coordinator present : Fae Pippin, SLP     Current Status/Progress Goal Weekly Team Focus  Bowel/Bladder   pt is continent of b/b   remain continent   assist with toileting q4h/ prn    Swallow/Nutrition/ Hydration               ADL's   Setup/supervision for UB ADLs, intermdiate Min A for LB ADLs & toileting. Education provided on AE but spouse supporting as needed.   Adjusted goals to supervision due to fluctuating adherence to sternal precautions.   Caregiver education and discharge planning.    Mobility   supervision/CGA overall, ambulating with RW >200'   upgraded to supervision  ready for DC    Communication                Safety/Cognition/ Behavioral Observations  supervision - modI   modI   error awareness, self correction, IADLs, pt/family education    Pain   no c/o pain   keep pt pain free   assess q shift/ prn    Skin   sternal incision remains intact and open to air, no drainage noted   prevent dehiscence  assess q shift/prn and follow sternal precautions      Discharge Planning:  Pt will discharge ot home  with support from wife, and support from dtr and son; wife works PT from home 8am-5pm 3days per week. Does not appear to be an issues iwth providing care per her reports. Fam edu on Tuesday 9am-12pm. Outpatient PT/OT/SLP at [facility]. Has DME. SW will confirm there are no barriers to discharge.   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 Patient on sternal precaution. Patient limited by pain on right dorsal foot, fluctuating blood pressures: medication adjusted by MD.    Patient on target to meet rehab goals: yes,  Patient on target to meeting his goals set for supervision at discharge.   *See Care Plan and progress notes for long and short-term goals.   Revisions to Treatment Plan:  N/a   Teaching Needs: Safety, toileting, transfers, medications, etc.    Current Barriers to Discharge: Decreased caregiver support  Possible Resolutions to Barriers: Family education Outpatient follow up     Medical Summary Current Status: medically complicated by heart failure, insomnia, leukocytosis, hypertension, R foot pain/edema  Barriers to Discharge: Cardiac Complications;Medical stability;Self-care education;Uncontrolled Hypertension;Uncontrolled Pain;Volume Overload   Possible Resolutions to [employer]: Titration of diuretics to reduce volume overload, minimal tolerated pain medications for right lower extremity pain, melatonin and monitoring for insomnia, wound care monitoring   Continued Need for Acute Rehabilitation Level of Care: The patient  requires daily medical management by a physician with specialized training in physical medicine and rehabilitation for the following reasons: Direction of a multidisciplinary physical rehabilitation program to maximize functional independence : Yes Medical management of patient stability for increased activity during participation in an intensive rehabilitation  regime.: Yes Analysis of laboratory values and/or radiology reports with any subsequent need for medication adjustment and/or medical intervention. : Yes   I attest that I was present, lead the team conference, and concur with the assessment and plan of the team.   Konrad Dolores 01/23/2024, 1048 am

## 2024-01-23 NOTE — Plan of Care (Signed)
  Problem: RH Swallowing Goal: LTG Patient will consume least restrictive diet using compensatory strategies with assistance (SLP) Description: LTG:  Patient will consume least restrictive diet using compensatory strategies with assistance (SLP) Outcome: Completed/Met   Problem: RH Problem Solving Goal: LTG Patient will demonstrate problem solving for (SLP) Description: LTG:  Patient will demonstrate problem solving for basic/complex daily situations with cues  (SLP) Outcome: Completed/Met

## 2024-01-24 NOTE — Progress Notes (Signed)
 Inpatient Rehabilitation Care Coordinator Discharge Note   Patient Details  Name: Tyler David MRN: [id number] Date of Birth: 05/04/1946   Discharge location: D/c to home with wife  Length of Stay: 11 days  Discharge activity level: Supervision  Home/community participation: Limited  Patient response NF:AOZHYQ Literacy - How often do you need to have someone help you when you read instructions, pamphlets, or other written material from your doctor or pharmacy?: Rarely  Patient response MV:HQIONG Isolation - How often do you feel lonely or isolated from those around you?: Never  Services provided included: MD, RD, SLP, Pharmacy, Neuropsych, SW, TR, CM, RN, OT, PT  Financial Services:  Financial Services Utilized: Private Insurance [employer]  Choices offered to/list presented to: patient and pt wife  Follow-up services arranged:  Outpatient    Outpatient Servicies: [facility] for PT      Patient response to transportation need: Is the patient able to respond to transportation needs?: Yes In the past 12 months, has lack of transportation kept you from medical appointments or from getting medications?: No In the past 12 months, has lack of transportation kept you from meetings, work, or from getting things needed for daily living?: No   Patient/Family verbalized understanding of follow-up arrangements:  Yes  Individual responsible for coordination of the follow-up plan: contact pt wife  Confirmed correct DME delivered: Gretchen Short 01/24/2024    Comments (or additional information):fam edu completed  Summary of Stay    Date/Time Discharge Planning CSW  01/22/24 1329 Pt will discharge ot home with support from wife, and support from dtr and son; wife works PT from home 8am-5pm 3days per week. Does not appear to be an issues iwth providing care per her reports. Fam edu on Tuesday 9am-12pm. Outpatient PT/OT/SLP at [facility]. Has DME. SW  will confirm there are no barriers to discharge. AAC  01/15/24 1007 Pt will discharge ot home with support from wife, and support from dtr and son; wife works PT from home 8am-5pm 3days per week. Does not appear to be an issues iwth providing care per her reports. SW will confirm there are no barriers to discharge. AAC       [employer] A Lula Olszewski

## 2024-01-30 ENCOUNTER — Ambulatory Visit: Attending: Physician Assistant | Admitting: Physical Therapy

## 2024-01-30 DIAGNOSIS — R269 Unspecified abnormalities of gait and mobility: Secondary | ICD-10-CM | POA: Diagnosis not present

## 2024-01-30 DIAGNOSIS — R2689 Other abnormalities of gait and mobility: Secondary | ICD-10-CM | POA: Insufficient documentation

## 2024-01-30 DIAGNOSIS — I639 Cerebral infarction, unspecified: Secondary | ICD-10-CM | POA: Insufficient documentation

## 2024-01-30 DIAGNOSIS — R2681 Unsteadiness on feet: Secondary | ICD-10-CM | POA: Diagnosis not present

## 2024-01-30 DIAGNOSIS — R262 Difficulty in walking, not elsewhere classified: Secondary | ICD-10-CM | POA: Diagnosis not present

## 2024-01-30 DIAGNOSIS — M6281 Muscle weakness (generalized): Secondary | ICD-10-CM | POA: Insufficient documentation

## 2024-01-30 NOTE — Progress Notes (Unsigned)
 Cardiology Office Note    Date:  01/31/2024   ID:  Tyler David, DOB Apr 02, 1946, MRN [id number]  PCP:  Noberto Retort, MD  Cardiologist:  Yvonne Kendall, MD  Electrophysiologist:  None   Chief Complaint: Hospital follow-up  History of Present Illness:   Tyler David is a 78 y.o. male with history of CAD s/p three-vessel CABG on 01/04/2024 with LIMA to LAD, SVG to OM, and SVG to PDA complicated by postoperative multi distribution CVAs, postoperative A-fib, recently diagnosed cardiomyopathy, HTN, HLD, hypothyroidism, prostate cancer, CKD stage III, LBBB, mild left-sided carotid artery stenosis estimated at 1 to 39%, mild PAD involving the left lower extremity, GERD, and migraine headaches who presents for follow up of recent CABG.   He was initially evaluated by our office in 12/2019 for palpitations and newly diagnosed LBBB.  At that time, he was asymptomatic other than sporadic palpitations.  Echo in 01/2020 showed an EF of 55%, no regional wall motion normalities, mild LVH, grade 1 diastolic dysfunction, normal RV systolic function and ventricular cavity size, and moderately elevated RVSP estimated at 41.4 mmHg.  Lexiscan MPI in 01/2020 was low risk with an EF of 55 to 65%.  LBBB noted.  He was last seen in the office in 03/2020 noting a 20 pound weight loss with dietary modification with associated improved blood pressure.  He reported minimal palpitations.   He was evaluated by his PCP's office in 09/2023 for URI and chest discomfort.  He was started on pantoprazole.  Workup at that time included a normal D-dimer, BNP 570, chest x-ray with possible pulmonary edema, and a CT of the chest showed pulmonary residual edema, coronary artery calcification, and cholelithiasis.  Chronic left rib fractures were also noted.  He was started on furosemide with some improvement in symptoms, though it ran out at time of his follow-up appointment with PCP in December.  At his with PCP in 10/2023, he was  restarted on furosemide 20 mg daily.  Echo in 10/2023 showed an EF of 35 to 40%, mild global hypokinesis with severe inferior wall hypokinesis, grade 1 diastolic dysfunction, normal RV systolic function and ventricular cavity size, mild to moderate mitral regurgitation, and an estimated right atrial pressure of 3 mmHg.  He was seen in our office in 11/2023 and reported a 2-3 history of exertional chest discomfort and burning that was exertional and progressive.  He underwent R/LHC in 11/2023 that showed severe three-vessel CAD with recommendation for CT surgery consultation for consideration of CABG.  RHC showed normal left and right filling pressures and normal cardiac output/index.  He was evaluated by CVTS and underwent three-vessel CABG on 01/04/2024.  Postoperative course was notable for weakness in the right lower extremity with subsequent CT scan revealing a left frontal CVAs, A-fib with RVR with some pauses of uncertain duration (scanned in telemetry strips do not reveal A-fib or pauses).  MRI of the brain revealed acute/subacute nonhemorrhagic infarction throughout both cerebral hemispheres and left greater than right cerebellum, as well as acute/subacute infarct in the left ACA distribution and additionally there were posterior medial parietal lobe infarctions bilaterally left greater than right as well as acute nonhemorrhagic infarct in the ACA/MCA watershed territory bilaterally.  He was evaluated by neurology with recommendation to be placed on apixaban and aspirin.  He was placed on a heparin drip and subsequently transitioned to apixaban.  Remaining postoperative course was also notable for volume overload complicated by acute on CKD, expected acute blood loss  anemia, and HTN.  He was discharged to inpatient rehab.  He comes in accompanied by his wife and is without symptoms of angina or cardiac decompensation.  He does note some ongoing fatigue following his hospitalization.  No dizziness, presyncope,  or syncope.  Advancing activity as tolerated.  No further lower extremity swelling.  Has been taking amlodipine 5 mg daily since hospital discharge.  Adherent and tolerating apixaban with aspirin without falls or symptoms concerning for bleeding.  Has follow-up with cardiothoracic surgery scheduled for next month.  Weakness in the right lower extremity continues to improve.  No new focal deficits.   Labs independently reviewed: 01/2024 - Hgb 11.3, PLT 480, potassium 4.2, BUN 29, serum creatinine 2.43 12/2023 - BNP 1263, albumin 2.5, AST/ALT normal, magnesium 3.2, TC 170, TG 159, HDL 26, LDL 112, A1c 5.4  Past Medical History:  Diagnosis Date   Arthritis    Cancer (HCC)    prostate cancer    Cardiomyopathy (HCC)    Chronic kidney disease, stage 3 (HCC)    Coronary artery disease    ED (erectile dysfunction)    GERD (gastroesophageal reflux disease)    Headache    hx of migraine-2015    Hyperlipidemia    Hypertension    Hypothyroidism    LBBB (left bundle branch block)    Migraine    Nocturia    Overactive bladder    Pneumonia    hx of at age 9     Past Surgical History:  Procedure Laterality Date   CARPAL TUNNEL RELEASE Bilateral    CORONARY ARTERY BYPASS GRAFT N/A 01/04/2024   Procedure: CORONARY ARTERY BYPASS GRAFTING TIMES THREE USING LEFT INTERNAL MAMMARY ARTERY AND RIGHT GREAT SAPHENOUS VEIN HARVESTED ENDOSCOPICALLY;  Surgeon: Corliss Skains, MD;  Location: [facility] OR;  Service: Open Heart Surgery;  Laterality: N/A;   INTRAOPERATIVE TRANSESOPHAGEAL ECHOCARDIOGRAM N/A 01/04/2024   Procedure: ECHOCARDIOGRAM, TRANSESOPHAGEAL, INTRAOPERATIVE;  Surgeon: Corliss Skains, MD;  Location: [facility] OR;  Service: Open Heart Surgery;  Laterality: N/A;   PROSTATE SURGERY     RIGHT/LEFT HEART CATH AND CORONARY ANGIOGRAPHY Bilateral 12/15/2023   Procedure: RIGHT/LEFT HEART CATH AND CORONARY ANGIOGRAPHY;  Surgeon: Yvonne Kendall, MD;  Location: [facility] INVASIVE CV LAB;  Service: Cardiovascular;   Laterality: Bilateral;   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   Procedure: LEFT TOTAL HIP ARTHROPLASTY ANTERIOR APPROACH;  Surgeon: Durene Romans, MD;  Location: [facility] ORS;  Service: Orthopedics;  Laterality: Left;    Current Medications: Current Meds  Medication Sig   amiodarone (PACERONE) 200 MG tablet Take 1 tablet (200 mg total) by mouth daily.   apixaban (ELIQUIS) 5 MG TABS tablet Take 1 tablet (5 mg total) by mouth 2 (two) times daily.   aspirin EC 81 MG tablet Take 1 tablet (81 mg total) by mouth daily. Swallow whole.   bisacodyl (DULCOLAX) 5 MG EC tablet Take 2 tablets (10 mg total) by mouth daily.   cholecalciferol (VITAMIN D3) 25 MCG (1000 UNIT) tablet Take 1 tablet (1,000 Units total) by mouth daily.   diclofenac Sodium (VOLTAREN) 1 % GEL Apply 2 g topically 4 (four) times daily.   docusate sodium (COLACE) 100 MG capsule Take 2 capsules (200 mg total) by mouth daily.   ezetimibe (ZETIA) 10 MG tablet Take 1 tablet (10 mg total) by mouth daily.   famotidine (PEPCID) 20 MG tablet Take 1 tablet (20 mg total) by mouth every evening.   furosemide (LASIX) 40 MG tablet Take 1 tablet (40 mg total)  by mouth daily.   levothyroxine (SYNTHROID) 50 MCG tablet Take 1 tablet (50 mcg total) by mouth daily before breakfast.   loratadine (CLARITIN) 10 MG tablet Take 1 tablet (10 mg total) by mouth daily.   MAGNESIUM PO Take 1 tablet by mouth daily.   melatonin 5 MG TABS Take 1 tablet (5 mg total) by mouth at bedtime.   metoprolol tartrate (LOPRESSOR) 25 MG tablet Take 0.5 tablets (12.5 mg total) by mouth 2 (two) times daily.   Multiple Vitamins-Minerals (ICAPS AREDS 2 PO) Take 1 capsule by mouth 2 (two) times daily.   nitroGLYCERIN (NITROSTAT) 0.4 MG SL tablet Place 1 tablet (0.4 mg total) under the tongue every 5 (five) minutes as needed for chest pain.   Omega-3 Fatty Acids (FISH OIL) 1000 MG CAPS Take 1,000 mg by mouth daily.   Turmeric 500 MG CAPS Take 500 mg by mouth 2 (two) times daily.    [DISCONTINUED] amLODipine (NORVASC) 10 MG tablet Take 1 tablet (10 mg total) by mouth daily.    Allergies:   Lipitor [atorvastatin], Pravachol [pravastatin], Statins, Zocor [simvastatin], and Penicillins   Social History   Socioeconomic History   Marital status: Married    Spouse name: Not on file   Number of children: 2   Years of education: Not on file   Highest education level: Not on file  Occupational History   Not on file  Tobacco Use   Smoking status: Never   Smokeless tobacco: Never  Vaping Use   Vaping status: Never Used  Substance and Sexual Activity   Alcohol use: Not Currently   Drug use: No   Sexual activity: Not on file  Other Topics Concern   Not on file  Social History Narrative   Lives with Dolores, wife, and indoor pet-dog.    Social Drivers of Corporate investment banker Strain: Not on file  Food Insecurity: No Food Insecurity (01/04/2024)   Hunger Vital Sign    Worried About Running Out of Food in the Last Year: Never true    Ran Out of Food in the Last Year: Never true  Transportation Needs: No Transportation Needs (01/04/2024)   PRAPARE - Administrator, Civil Service (Medical): No    Lack of Transportation (Non-Medical): No  Physical Activity: Not on file  Stress: Not on file  Social Connections: Socially Integrated (01/04/2024)   Social Connection and Isolation Panel [NHANES]    Frequency of Communication with Friends and Family: More than three times a week    Frequency of Social Gatherings with Friends and Family: Three times a week    Attends Religious Services: More than 4 times per year    Active Member of Clubs or Organizations: Yes    Attends Engineer, structural: More than 4 times per year    Marital Status: Married     Family History:  The patient's family history includes Atrial fibrillation in his brother; COPD in his father; Heart failure in his father; Ovarian cancer in his mother; Prostate cancer in his  brother.  ROS:   12-point review of systems is negative unless otherwise noted in the HPI.   EKGs/Labs/Other Studies Reviewed:    Studies reviewed were summarized above. The additional studies were reviewed today:  Intraoperative TEE 01/04/2024: POST-OP IMPRESSIONS  _ Left Ventricle: The left ventriclar function is mildly reduced on  inotropic  support. EF is approximately 40%. Hypokinesis of inferior and inferoseptal  walls  unchanged from prebypass evaluation.  _ Right Ventricle: The right ventriclar function is normal on inotropic  support.  _ Aorta: The aorta appears unchanged from pre-bypass. There is no  dissection.  _ Aortic Valve: The aortic valve appears unchanged from pre-bypass. Mild  AI.  _ Mitral Valve: The mitral valve appears unchanged from pre-bypass. Mild  MR.  _ Tricuspid Valve: The tricuspid valve appears unchanged from pre-bypass.  Trace  TR.  _ Pulmonic Valve: The pulmonic valve appears unchanged from pre-bypass.  Mild PI  __________  Pre-CABG vascular ultrasound 01/02/2024: Summary:  Right Carotid: Velocities in the right ICA are consistent with a 1-39%  stenosis.   Left Carotid: There is no evidence of stenosis in the left ICA.  Vertebrals:  Bilateral vertebral arteries demonstrate antegrade flow.  Subclavians: Normal flow hemodynamics were seen in bilateral subclavian               arteries.   Right ABI: Resting right ankle-brachial index is within normal range. The  right toe-brachial index is mildly decreased.  Left ABI: Resting left ankle-brachial index indicates mild left lower  extremity arterial disease. The left toe-brachial index is abnormal.  Bilateral Extremity: Doppler waveforms remain within normal limits with  compression bilaterally for the radial arteries. Doppler waveforms remain  within normal limits with compression bilaterally for the ulnar arteries.  __________  [facility] [address] 12/15/2023: Conclusions: Severe three-vessel coronary  artery disease, as detailed below. Normal left and right heart filling pressures. Normal Fick cardiac output/index.   Recommendations: Outpatient cardiac surgery consultation for CABG. Change furosemide to as needed for weight gain/edema. Continue escalation of GDMT as tolerated. Consider adding PCSK9 inhibitor at follow-up, given intolerance to multiple statins. __________  2D echo 11/09/2023: 1. Left ventricular ejection fraction, by estimation, is 35 to 40%. Left  ventricular ejection fraction by PLAX is 35 %. The left ventricle has  moderately decreased function. The left ventricle demonstrates mild global  hypokinesis with severe inferior  wall hypokinesis. Left ventricular diastolic parameters are consistent  with Grade I diastolic dysfunction (impaired relaxation).   2. Right ventricular systolic function is normal. The right ventricular  size is normal.   3. The mitral valve is normal in structure. Mild to moderate mitral valve  regurgitation. No evidence of mitral stenosis.   4. The aortic valve is normal in structure. Aortic valve regurgitation is  not visualized. No aortic stenosis is present.   5. The inferior vena cava is normal in size with greater than 50%  respiratory variability, suggesting right atrial pressure of 3 mmHg.  __________   Eugenie Birks MPI 02/10/2020: The study is normal. This is a low risk study. The left ventricular ejection fraction is normal (55-65%). LBBB __________   2D echo 01/24/2020: 1. Left ventricular ejection fraction, by estimation, is 55%. The left  ventricle has normal function. The left ventricle has no regional wall  motion abnormalities. There is mild left ventricular hypertrophy. Left  ventricular diastolic parameters are  consistent with Grade I diastolic dysfunction (impaired relaxation).   2. Right ventricular systolic function is normal. The right ventricular  size is normal. There is moderately elevated pulmonary artery systolic   pressure.    EKG:  EKG is ordered today.  The EKG ordered today demonstrates NSR, 81 bpm, LBBB, occasional PVCs  Recent Labs: 01/08/2024: Magnesium 3.2 01/15/2024: ALT 17; B Natriuretic Peptide 1,263.2 01/22/2024: BUN 29; Creatinine, Ser 2.43; Potassium 4.2; Sodium 141 01/23/2024: Hemoglobin 11.3; Platelets 480  Recent Lipid Panel    Component Value Date/Time  CHOL 170 01/07/2024 0819   TRIG 159 (H) 01/07/2024 0819   HDL 26 (L) 01/07/2024 0819   CHOLHDL 6.5 01/07/2024 0819   VLDL 32 01/07/2024 0819   LDLCALC 112 (H) 01/07/2024 0819    PHYSICAL EXAM:    VS:  BP 118/70 (BP Location: Left Arm, Patient Position: Sitting, Cuff Size: Normal)   Pulse 80   Ht 5\' 8"  (1.727 m)   Wt 182 lb (82.6 kg)   SpO2 97%   BMI 27.67 kg/m   BMI: Body mass index is 27.67 kg/m.  Physical Exam Vitals reviewed.  Constitutional:      Appearance: He is well-developed.  HENT:     Head: Normocephalic and atraumatic.  Eyes:     General:        Right eye: No discharge.        Left eye: No discharge.  Neck:     Vascular: No JVD.  Cardiovascular:     Rate and Rhythm: Normal rate and regular rhythm.     Pulses:          Posterior tibial pulses are 2+ on the right side and 2+ on the left side.     Heart sounds: Normal heart sounds, S1 normal and S2 normal. Heart sounds not distant. No midsystolic click and no opening snap. No murmur heard.    No friction rub.     Comments: Well-healing anterior midline surgical incision site without evidence of bleeding or dehiscence. Pulmonary:     Effort: Pulmonary effort is normal. No respiratory distress.     Breath sounds: Normal breath sounds. No decreased breath sounds, wheezing, rhonchi or rales.  Chest:     Chest wall: No tenderness.  Abdominal:     General: There is no distension.     Palpations: Abdomen is soft.     Tenderness: There is no abdominal tenderness.  Musculoskeletal:     Cervical back: Normal range of motion.     Right lower leg: No  edema.     Left lower leg: No edema.  Skin:    General: Skin is warm and dry.     Nails: There is no clubbing.  Neurological:     Mental Status: He is alert and oriented to person, place, and time.  Psychiatric:        Speech: Speech normal.        Behavior: Behavior normal.        Thought Content: Thought content normal.        Judgment: Judgment normal.     Wt Readings from Last 3 Encounters:  01/31/24 182 lb (82.6 kg)  01/23/24 181 lb 10.5 oz (82.4 kg)  01/12/24 192 lb 7.4 oz (87.3 kg)     ASSESSMENT & PLAN:   CAD status post three-vessel CABG: He is doing well and without symptoms concerning for angina or cardiac decompensation following surgical revascularization.  Continue aggressive risk factor modification and secondary prevention including aspirin 81 mg, amlodipine 5 mg, Lopressor 12.5 mg twice daily, and ezetimibe 10 mg.  Follow-up with CVTS as directed.  Cardiac rehab when cleared by cardiothoracic surgery.  Revisit lipid-lowering strategy at follow-up visit.  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  LBBB predates cardiomyopathy.  Currently on Lopressor 12.5 mg twice daily.  Renal dysfunction precludes addition of ACE inhibitor, ARB, ARNI, or MRA at this time.  If labs are stable would look to escalate GDMT with addition of SGLT2 inhibitor.  Escalate GDMT as able moving forward.  Pending ZIO results as outlined below, anticipate transitioning Lopressor to Toprol-XL or carvedilol to further optimize evidence-based pharmacotherapy.  Update renal function to assess need for loop diuretic.  Anticipate follow-up echo in several months time following surgical revascularization and optimization of GDMT to evaluate cardiomyopathy.  PAF: Maintaining sinus rhythm.  Initially noted postoperatively following CABG complicated by multi distribution CVAs postoperatively.  Remains on Lopressor 12.5 mg twice daily.  Placed Zio patch to evaluate for breakthrough A-fib as well as to evaluate  for potential pauses as noted during hospital admission.  If there is not significant A-fib burden noted on outpatient cardiac monitoring, anticipate discontinuing amiodarone after 3 months.  CHA2DS2-VASc at least 7 (CHF, HTN, age x 2, CVA x 2, vascular disease).  Remains on apixaban 5 mg twice daily.  Check BMP and CBC.  Multi-distribution CVAs: Occurring postoperatively versus new onset A-fib.  Now on apixaban and aspirin.  Currently on ezetimibe.  Anticipate discussion of PCSK9 inhibitor for ongoing lipid management in follow-up.  Follow-up with neurology as directed.  No evidence of left atrial or left atrial appendage thrombus on intraoperative TEE.  Mitral regurgitation: Mild by TEE intraoperatively on 01/04/2024.  Monitor.  Carotid artery stenosis: Pre-CABG carotid artery ultrasound showed 1 to 39% right ICA stenosis with no evidence of stenosis in the left ICA.  Aspirin and ezetimibe.  HTN: Blood pressure is well-controlled in the office today.  He remains on amlodipine 5 mg and Lopressor 12.5 mg twice daily.  HLD with statin intolerance: LDL 112 in 12/2023 with target LDL less than 55.  Currently on ezetimibe 10 mg.  Anticipate PCSK9 inhibitor discussion at next visit.  Time constraints regarding significant comorbidities precluded this at today's visit.  CKD stage IIIb: Obtain BMP.  PAD: Pre-CABG vascular imaging showed mild left lower extremity arterial disease.  No symptoms of lifestyle limiting claudication or nonhealing wounds at this time.  Aspirin and lipid therapy as outlined above.  Acute blood loss anemia: Improving on most recent check.  Check CBC.  Denies symptoms concerning for bleeding.    Disposition: F/u with Dr. Nolan Battle or an APP in 1 month.   Medication Adjustments/Labs and Tests Ordered: Current medicines are reviewed at length with the patient today.  Concerns regarding medicines are outlined above. Medication changes, Labs and Tests ordered today are summarized above  and listed in the Patient Instructions accessible in Encounters.   Signed, Varney Gentleman, PA-C 01/31/2024 12:42 PM     [facility] [place] [address] [place], [place] [address] [phone]

## 2024-01-30 NOTE — Therapy (Signed)
 OUTPATIENT PHYSICAL THERAPY NEURO EVALUATION   Patient Name: Tyler David MRN: [id number] DOB:December 01, 1945, 78 y.o., male Today's Date: 01/30/2024   PCP:   Tyler Congo, MD   REFERRING PROVIDER:    Sterling Eisenmenger, PA-C    END OF SESSION:  PT End of Session - 01/30/24 1505     Visit Number 1    Number of Visits 24    Date for PT Re-Evaluation 04/23/24    Progress Note Due on Visit 10    PT Start Time 1505    PT Stop Time 1550    PT Time Calculation (min) 45 min             Past Medical History:  Diagnosis Date   Arthritis    Cancer (HCC)    prostate cancer    Cardiomyopathy (HCC)    Chronic kidney disease, stage 3 (HCC)    Coronary artery disease    ED (erectile dysfunction)    GERD (gastroesophageal reflux disease)    Headache    hx of migraine-2015    Hyperlipidemia    Hypertension    Hypothyroidism    LBBB (left bundle branch block)    Migraine    Nocturia    Overactive bladder    Pneumonia    hx of at age 66    Past Surgical History:  Procedure Laterality Date   CARPAL TUNNEL RELEASE Bilateral    CORONARY ARTERY BYPASS GRAFT N/A 01/04/2024   Procedure: CORONARY ARTERY BYPASS GRAFTING TIMES THREE USING LEFT INTERNAL MAMMARY ARTERY AND RIGHT GREAT SAPHENOUS VEIN HARVESTED ENDOSCOPICALLY;  Surgeon: Tyler Lovely, MD;  Location: [facility] OR;  Service: Open Heart Surgery;  Laterality: N/A;   INTRAOPERATIVE TRANSESOPHAGEAL ECHOCARDIOGRAM N/A 01/04/2024   Procedure: ECHOCARDIOGRAM, TRANSESOPHAGEAL, INTRAOPERATIVE;  Surgeon: Tyler Lovely, MD;  Location: [facility] OR;  Service: Open Heart Surgery;  Laterality: N/A;   PROSTATE SURGERY     RIGHT/LEFT HEART CATH AND CORONARY ANGIOGRAPHY Bilateral 12/15/2023   Procedure: RIGHT/LEFT HEART CATH AND CORONARY ANGIOGRAPHY;  Surgeon: Tyler Crisp, MD;  Location: [facility] INVASIVE CV [facility];  Service: Cardiovascular;  Laterality: Bilateral;   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   Procedure: LEFT TOTAL HIP  ARTHROPLASTY ANTERIOR APPROACH;  Surgeon: Claiborne Crew, MD;  Location: [facility];  Service: Orthopedics;  Laterality: Left;   Patient Active Problem List   Diagnosis Date Noted   Cardioembolic stroke (HCC) 01/12/2024   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   Angina pectoris (HCC) 12/15/2023   HFrEF (heart failure with reduced ejection fraction) (HCC) 12/15/2023   Palpitations 12/16/2019   PVC's (premature ventricular contractions) 12/16/2019   Left bundle branch block 12/16/2019   Essential hypertension 12/16/2019   Osteoarthritis of knee 06/07/2018   S/P left THA, 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    ONSET DATE: 01/12/24  REFERRING DIAG: I63.9 (ICD-10-CM) - Cardioembolic stroke (HCC)   THERAPY DIAG:  Abnormality of gait and mobility  Difficulty in walking, not elsewhere classified  Muscle weakness (generalized)  Other abnormalities of gait and mobility  Unsteadiness on feet  Rationale for Evaluation and Treatment: Rehabilitation  SUBJECTIVE:  SUBJECTIVE STATEMENT: [name] Pt finds standing regularly a challenge, has difficulty with bladder control, especially now that he is on lasix.    Pt accompanied by: significant other Tyler David   PERTINENT HISTORY: Tyler David is a 78 y.o. 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   PAIN:  Are you having pain? No  PRECAUTIONS: Fall  RED FLAGS: None   WEIGHT BEARING RESTRICTIONS: No  FALLS: Has patient fallen in last 6 months? No  LIVING  ENVIRONMENT: Lives with: lives with their family and lives with their spouse Lives in: House/apartment Stairs:  threshold step  Has following equipment at home: Single point cane and Walker - 2 wheeled  PLOF: Independent  PATIENT GOALS: Get energy, strength, and balance back to pre morbid function   OBJECTIVE:  Note: Objective measures were completed at Evaluation unless otherwise noted.  DIAGNOSTIC FINDINGS: MRI findings 3/22: IMPRESSION: 1. Multiple foci of acute/subacute nonhemorrhagic infarction is present throughout both cerebral hemispheres and left greater than right cerebellum. 2. Acute/subacute infarcts in the left ACA distribution. 3. Additional posteromedial parietal lobe infarcts bilaterally, left greater than right. 4. Diffuse punctate foci of acute nonhemorrhagic infarction across the ACA/MCA watershed territories bilaterally. 5. Bilateral cerebellar infarcts are more prominent on the left. 6. The constellation of findings suggest a central embolic source. 7. Mild periventricular T2 hyperintensities bilaterally likely reflect the sequela of chronic microvascular ischemia.    COGNITION: Overall cognitive status: Impaired and Pt reports his memory and sharpness are lessened sinnce the stroke.  Patient's wife/caregiver has to correct on a few occasions with some subjective information.    LOWER EXTREMITY ROM:     Within normal limits for tasks assessed  LOWER EXTREMITY MMT:    MMT Right Eval Left Eval  Hip flexion 4 4+  Hip extension    Hip abduction 4 4+  Hip adduction 4 4+  Hip internal rotation    Hip external rotation    Knee flexion 4 4+  Knee extension 4 4+  Ankle dorsiflexion 4 4+  Ankle plantarflexion    Ankle inversion    Ankle eversion    (Blank rows = not tested)  BED MOBILITY:  Not tested  TRANSFERS: Sit to stand: Complete Independence  Assistive device utilized: Single point cane     Stand to sit: Complete Independence  Assistive  device utilized: None     Chair to chair: Modified independence  Assistive device utilized: None       RAMP:  Not tested  CURB:  Not tested  STAIRS: Assess in future visit  GAIT: Has slight Trendelenburg gait and slow gait speed with decreased stance time on his right lower extremity.  This is more exacerbated with use of cane and even more exacerbated with without assistive device with comparison to utilizing walker.  FUNCTIONAL TESTS:  5 times sit to stand: 23 sec Timed up and go (TUG): 20.55 sec 10 meter walk test: 14.35 sec (.69 m/s) 2WW, 17.43 sec ( .56 m/s) [facility] Berg Balance Scale: 43  [facility] PT Assessment - 01/30/24 0001       Standardized Balance Assessment   Standardized Balance Assessment Berg Balance Test      Berg Balance Test   Sit  to Stand Able to stand without using hands and stabilize independently    Standing Unsupported Able to stand safely 2 minutes    Sitting with Back Unsupported but Feet Supported on Floor or Stool Able to sit safely and securely 2 minutes    Stand to Sit Sits safely with minimal use of hands    Transfers Able to transfer safely, minor use of hands    Standing Unsupported with Eyes Closed Able to stand 10 seconds safely    Standing Unsupported with Feet Together Able to place feet together independently and stand 1 minute safely    From Standing, Reach Forward with Outstretched Arm Can reach confidently >25 cm (10")    From Standing Position, Pick up Object from Floor Able to pick up shoe, needs supervision    From Standing Position, Turn to Look Behind Over each Shoulder Turn sideways only but maintains balance    Turn 360 Degrees Needs close supervision or verbal cueing    Standing Unsupported, Alternately Place Feet on Step/Stool Able to complete >2 steps/needs minimal assist    Standing Unsupported, One Foot in Front Able to plae foot ahead of the other independently and hold 30 seconds    Standing on One Leg Tries to lift leg/unable to  hold 3 seconds but remains standing independently    Total Score 43              PATIENT SURVEYS:  Stroke Impact Scale 16 46/80                                                                                                                              TREATMENT DATE:01/30/24   SELF CARE Patient instructed in plan of care, findings for evaluation, and ways of physical therapy may improve their function and quality of life.     PATIENT EDUCATION: Education details: POC Person educated: Patient Education method: Explanation Education comprehension: verbalized understanding   HOME EXERCISE PROGRAM: Establish visit 2    GOALS: Goals reviewed with patient? Yes  SHORT TERM GOALS: Target date: 02/27/2024       Patient will be independent in home exercise program to improve strength/mobility for better functional independence with ADLs. Baseline: No HEP currently  Goal status: INITIAL   LONG TERM GOALS: Target date: 04/23/2024    1.  Patient (> 57 years old) will complete five times sit to stand test in < 15 seconds indicating an increased LE strength and improved balance. Baseline: 23 sec Goal status: INITIAL  2.  Patient will improve SIS-16 score to 60   to demonstrate statistically significant improvement in mobility and quality of life as it relates to their mobility and QOL as a result of their CVA.  Baseline: 46 Goal status: INITIAL   3.  Patient will increase Berg Balance score by > 6 points to demonstrate decreased fall risk during functional activities. Baseline: 43 Goal status: INITIAL   4.   Patient  will reduce timed up and go to <11 seconds  to reduce fall risk and demonstrate improved transfer/gait ability. Baseline: 20.55 sec no AD Goal status: INITIAL  5.   Patient will increase 10 meter walk test to >1.17m/s as to improve gait speed for better community ambulation and to reduce fall risk. Baseline: 14.35 sec (.69 m/s) 2WW, 17.43 sec ( .56 m/s)  SPC Goal status: INITIAL  6.   Patient will increase six minute walk test distance to >1000 for progression to community ambulator and improve gait ability Baseline: test visit 2, monitor VS as appropriate Goal status: INITIAL    ASSESSMENT:  CLINICAL IMPRESSION: Patient is a 78 year old male who present to physical therapy following a cerebrovascular accident as well as a procedure for heart revascularization that happened 2 days prior to his CVA.  Patient presents with deficits in lower extremity strength and balance evidenced by balance test and measures listed above.  Based on patient's current level of function he is at increased risk of falls as well below his premorbid level of function.  Patient still needs to be tested for 6-minute walk test but expected to be below community ambulator level when this is tested based on other functional tests and gait speed measures performed today.  Instructed patient that at his current level of function he is okay to walk around his house with his cane but it is recommended he utilize his walker in all environments outside of his home including his yard and surrounding areas.  Patient will benefit from skilled physical therapy to address the above impairments, improve patient's mobility, improve patient's balance and safety, improve patient's quality of life to that of his premorbid function.  OBJECTIVE IMPAIRMENTS: Abnormal gait, decreased activity tolerance, decreased balance, decreased endurance, decreased mobility, difficulty walking, decreased strength, and decreased safety awareness.   ACTIVITY LIMITATIONS: carrying, standing, squatting, and stairs  PARTICIPATION LIMITATIONS: meal prep, driving, community activity, and yard work  PERSONAL FACTORS: Age and 3+ comorbidities: hypertension migraine headaches hyperlipidemia, CKD stage III, prostate cancer, left total hip arthroplasty 09/29/2015, CAD/cardiomyopathy left bundle branch block  are also  affecting patient's functional outcome.   REHAB POTENTIAL: Good  CLINICAL DECISION MAKING: Evolving/moderate complexity  EVALUATION COMPLEXITY: Moderate  PLAN:  PT FREQUENCY: 2x/week  PT DURATION: 12 weeks  PLANNED INTERVENTIONS: 97110-Therapeutic exercises, 97530- Therapeutic activity, [id number]- Neuromuscular re-education, 97535- Self Care, 40981- Manual therapy, [phone]- Gait training, Patient/Family education, Balance training, and Stair training  PLAN FOR NEXT SESSION:   Edwina Gram, PT 01/30/2024, 3:43 PM

## 2024-01-31 ENCOUNTER — Ambulatory Visit: Attending: Physician Assistant | Admitting: Physician Assistant

## 2024-01-31 ENCOUNTER — Encounter: Payer: Self-pay | Admitting: Physician Assistant

## 2024-01-31 ENCOUNTER — Ambulatory Visit

## 2024-01-31 VITALS — BP 118/70 | HR 80 | Ht 68.0 in | Wt 182.0 lb

## 2024-01-31 DIAGNOSIS — Z951 Presence of aortocoronary bypass graft: Secondary | ICD-10-CM

## 2024-01-31 DIAGNOSIS — Z79899 Other long term (current) drug therapy: Secondary | ICD-10-CM | POA: Diagnosis not present

## 2024-01-31 DIAGNOSIS — I2510 Atherosclerotic heart disease of native coronary artery without angina pectoris: Secondary | ICD-10-CM | POA: Diagnosis not present

## 2024-01-31 DIAGNOSIS — E785 Hyperlipidemia, unspecified: Secondary | ICD-10-CM

## 2024-01-31 DIAGNOSIS — I447 Left bundle-branch block, unspecified: Secondary | ICD-10-CM

## 2024-01-31 DIAGNOSIS — I739 Peripheral vascular disease, unspecified: Secondary | ICD-10-CM

## 2024-01-31 DIAGNOSIS — I639 Cerebral infarction, unspecified: Secondary | ICD-10-CM

## 2024-01-31 DIAGNOSIS — I480 Paroxysmal atrial fibrillation: Secondary | ICD-10-CM

## 2024-01-31 DIAGNOSIS — N183 Chronic kidney disease, stage 3 unspecified: Secondary | ICD-10-CM

## 2024-01-31 DIAGNOSIS — I10 Essential (primary) hypertension: Secondary | ICD-10-CM | POA: Diagnosis not present

## 2024-01-31 DIAGNOSIS — I5020 Unspecified systolic (congestive) heart failure: Secondary | ICD-10-CM | POA: Diagnosis not present

## 2024-01-31 DIAGNOSIS — I255 Ischemic cardiomyopathy: Secondary | ICD-10-CM

## 2024-01-31 DIAGNOSIS — I340 Nonrheumatic mitral (valve) insufficiency: Secondary | ICD-10-CM

## 2024-01-31 DIAGNOSIS — Z789 Other specified health status: Secondary | ICD-10-CM

## 2024-01-31 DIAGNOSIS — D62 Acute posthemorrhagic anemia: Secondary | ICD-10-CM

## 2024-01-31 DIAGNOSIS — I6521 Occlusion and stenosis of right carotid artery: Secondary | ICD-10-CM

## 2024-01-31 MED ORDER — AMLODIPINE BESYLATE 5 MG PO TABS: 5.0000 mg | ORAL_TABLET | Freq: Every day | ORAL | 3 refills | Status: DC

## 2024-01-31 NOTE — Patient Instructions (Signed)
 Medication Instructions:  Your physician recommends the following medication changes.  DECREASE: Amlodipine 5 mg daily  *If you need a refill on your cardiac medications before your next appointment, please call your pharmacy*  Lab Work: Your provider would like for you to have following labs drawn today CBC and BMeT.   If you have labs (blood work) drawn today and your tests are completely normal, you will receive your results only by: MyChart Message (if you have MyChart) OR A paper copy in the mail If you have any lab test that is abnormal or we need to change your treatment, we will call you to review the results.  Testing/Procedures: Christena Deem- Long Term Monitor Instructions  Your physician has requested you wear a ZIO patch monitor for 14 days.  This is a single patch monitor. [employer] supplies one patch monitor per enrollment. Additional stickers are not available. Please do not apply patch if you will be having a Nuclear Stress Test, Echocardiogram, Cardiac CT, MRI, or Chest Xray during the period you would be wearing the monitor. The patch cannot be worn during these tests. You cannot remove and re-apply the ZIO XT patch monitor.  Your ZIO patch monitor will be mailed 3 day USPS to your address on file. It may take 3-5 days to receive your monitor after you have been enrolled.  Once you have received your monitor, please review the enclosed instructions. Your monitor has already been registered assigning a specific monitor serial # to you.  Billing and Patient Assistance Program Information  We have supplied [employer] with any of your insurance information on file for billing purposes. [employer] offers a sliding scale Patient Assistance Program for patients that do not have insurance, or whose insurance does not completely cover the cost of the ZIO monitor. You must apply for the Patient Assistance Program to qualify for this discounted rate. To apply, please call [employer] at [phone],  select option 4, select option 2, ask to apply for Patient Assistance Program. Meredeth Ide will ask your household income, and how many people are in your household. They will quote your out-of-pocket cost based on that information. [employer] will also be able to set up a 31-month, interest-free payment plan if needed.  Applying the monitor  Shave hair from upper left chest.  Hold abrader disc by orange tab. Rub abrader in 40 strokes over the upper left chest as indicated in your monitor instructions.  Clean area with 4 enclosed alcohol pads. Let dry.  Apply patch as indicated in monitor instructions. Patch will be placed under collarbone on left side of chest with arrow pointing upward.  Rub patch adhesive wings for 2 minutes. Remove white label marked "1". Remove the white label marked "2". Rub patch adhesive wings for 2 additional minutes.  While looking in a mirror, press and release button in center of patch. A small green light will flash 3-4 times. This will be your only indicator that the monitor has been turned on.  Do not shower for the first 24 hours. You may shower after the first 24 hours.  Press the button if you feel a symptom. You will hear a small click. Record Date, Time and Symptom in the Patient Logbook.  When you are ready to remove the patch, follow instructions on the last 2 pages of Patient Logbook. Stick patch monitor onto the last page of Patient Logbook.  Place Patient Logbook in the blue and white box. Use locking tab on box and tape box closed  securely. The blue and white box has prepaid postage on it. Please place it in the mailbox as soon as possible. Your physician should have your test results approximately 7 days after the monitor has been mailed back to [facility].  Call [facility] Customer Care at [phone] if you have questions regarding your ZIO XT patch monitor. Call them immediately if you see an orange light blinking on your monitor.  If your monitor  falls off in less than 4 days, contact our Monitor department at [phone].  If your monitor becomes loose or falls off after 4 days call [employer] at [phone] for suggestions on securing your monitor  Follow-Up: At [facility], you and your health needs are our priority.  As part of our continuing mission to provide you with exceptional heart care, our providers are all part of one team.  This team includes your primary Cardiologist (physician) and Advanced Practice Providers or APPs (Physician Assistants and Nurse Practitioners) who all work together to provide you with the care you need, when you need it.  Your next appointment:   1 month(s)  Provider:   You may see Sammy Crisp, MD or Varney Gentleman, PA-C

## 2024-02-01 ENCOUNTER — Ambulatory Visit

## 2024-02-01 ENCOUNTER — Other Ambulatory Visit: Payer: Self-pay | Admitting: Emergency Medicine

## 2024-02-01 DIAGNOSIS — I639 Cerebral infarction, unspecified: Secondary | ICD-10-CM

## 2024-02-01 DIAGNOSIS — R2681 Unsteadiness on feet: Secondary | ICD-10-CM

## 2024-02-01 DIAGNOSIS — R269 Unspecified abnormalities of gait and mobility: Secondary | ICD-10-CM | POA: Diagnosis not present

## 2024-02-01 DIAGNOSIS — R262 Difficulty in walking, not elsewhere classified: Secondary | ICD-10-CM

## 2024-02-01 DIAGNOSIS — R2689 Other abnormalities of gait and mobility: Secondary | ICD-10-CM

## 2024-02-01 DIAGNOSIS — M6281 Muscle weakness (generalized): Secondary | ICD-10-CM

## 2024-02-01 LAB — BASIC METABOLIC PANEL WITH GFR
BUN/Creatinine Ratio: 14 (ref 10–24)
BUN: 30 mg/dL — ABNORMAL HIGH (ref 8–27)
CO2: 23 mmol/L (ref 20–29)
Calcium: 9 mg/dL (ref 8.6–10.2)
Chloride: 104 mmol/L (ref 96–106)
Creatinine, Ser: 2.13 mg/dL — ABNORMAL HIGH (ref 0.76–1.27)
Glucose: 159 mg/dL — ABNORMAL HIGH (ref 70–99)
Potassium: 3.9 mmol/L (ref 3.5–5.2)
Sodium: 142 mmol/L (ref 134–144)
eGFR: 31 mL/min/{1.73_m2} — ABNORMAL LOW (ref >59)

## 2024-02-01 LAB — CBC
Hematocrit: 35.5 % — ABNORMAL LOW (ref 37.5–51.0)
Hemoglobin: 11.2 g/dL — ABNORMAL LOW (ref 13.0–17.7)
MCH: 28.7 pg (ref 26.6–33.0)
MCHC: 31.5 g/dL (ref 31.5–35.7)
MCV: 91 fL (ref 79–97)
Platelets: 323 10*3/uL (ref 150–450)
RBC: 3.9 x10E6/uL — ABNORMAL LOW (ref 4.14–5.80)
RDW: 12.8 % (ref 11.6–15.4)
WBC: 10.6 10*3/uL (ref 3.4–10.8)

## 2024-02-01 MED ORDER — FUROSEMIDE 40 MG PO TABS: 40.0000 mg | ORAL_TABLET | Freq: Every day | ORAL | 6 refills | Status: DC | PRN

## 2024-02-01 NOTE — Therapy (Signed)
 OUTPATIENT PHYSICAL THERAPY NEURO TREATMENT   Patient Name: Tyler David MRN: [id number] DOB:03/17/1946, 78 y.o., male Today's Date: 02/01/2024   PCP:   Tyler Retort, MD   REFERRING PROVIDER:    Charlton Amor, PA-C    END OF SESSION:  PT End of Session - 02/01/24 1437     Visit Number 2    Number of Visits 24    Date for PT Re-Evaluation 04/23/24    Progress Note Due on Visit 10    PT Start Time 1444    PT Stop Time 1528    PT Time Calculation (min) 44 min    Equipment Utilized During Treatment Gait belt    Activity Tolerance Patient tolerated treatment well    Behavior During Therapy WFL for tasks assessed/performed             Past Medical History:  Diagnosis Date   Arthritis    Cancer (HCC)    prostate cancer    Cardiomyopathy (HCC)    Chronic kidney disease, stage 3 (HCC)    Coronary artery disease    ED (erectile dysfunction)    GERD (gastroesophageal reflux disease)    Headache    hx of migraine-2015    Hyperlipidemia    Hypertension    Hypothyroidism    LBBB (left bundle branch block)    Migraine    Nocturia    Overactive bladder    Pneumonia    hx of at age 46    Past Surgical History:  Procedure Laterality Date   CARPAL TUNNEL RELEASE Bilateral    CORONARY ARTERY BYPASS GRAFT N/A 01/04/2024   Procedure: CORONARY ARTERY BYPASS GRAFTING TIMES THREE USING LEFT INTERNAL MAMMARY ARTERY AND RIGHT GREAT SAPHENOUS VEIN HARVESTED ENDOSCOPICALLY;  Surgeon: Tyler Skains, MD;  Location: [facility];  Service: Open Heart Surgery;  Laterality: N/A;   INTRAOPERATIVE TRANSESOPHAGEAL ECHOCARDIOGRAM N/A 01/04/2024   Procedure: ECHOCARDIOGRAM, TRANSESOPHAGEAL, INTRAOPERATIVE;  Surgeon: Tyler Skains, MD;  Location: [facility];  Service: Open Heart Surgery;  Laterality: N/A;   PROSTATE SURGERY     RIGHT/LEFT HEART CATH AND CORONARY ANGIOGRAPHY Bilateral 12/15/2023   Procedure: RIGHT/LEFT HEART CATH AND CORONARY ANGIOGRAPHY;  Surgeon: Tyler Kendall, MD;  Location: [facility] INVASIVE CV LAB;  Service: Cardiovascular;  Laterality: Bilateral;   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   Procedure: LEFT TOTAL HIP ARTHROPLASTY ANTERIOR APPROACH;  Surgeon: Tyler Romans, MD;  Location: [facility];  Service: Orthopedics;  Laterality: Left;   Patient Active Problem List   Diagnosis Date Noted   Cardioembolic stroke (HCC) 01/12/2024   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   Angina pectoris (HCC) 12/15/2023   HFrEF (heart failure with reduced ejection fraction) (HCC) 12/15/2023   Palpitations 12/16/2019   PVC's (premature ventricular contractions) 12/16/2019   Left bundle branch block 12/16/2019   Essential hypertension 12/16/2019   Osteoarthritis of knee 06/07/2018   S/P left THA, 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    ONSET DATE: 01/12/24  REFERRING DIAG: I63.9 (ICD-10-CM) - Cardioembolic stroke (HCC)   THERAPY DIAG:  Abnormality of gait and mobility  Difficulty in walking, not elsewhere classified  Muscle weakness (generalized)  Other abnormalities of gait and mobility  Unsteadiness on feet  Cardioembolic stroke (HCC)  Rationale for Evaluation and Treatment: Rehabilitation  SUBJECTIVE:  SUBJECTIVE STATEMENT: Pt denies falls. Feels well overall.  Pt accompanied by: significant other Tyler David   PERTINENT HISTORY: Tyler David is a 78 y.o. 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   PAIN:  Are you having pain? No  PRECAUTIONS: Fall  RED FLAGS: None   WEIGHT BEARING RESTRICTIONS: No  FALLS: Has patient fallen in last 6 months? No  LIVING ENVIRONMENT: Lives with: lives with their family and lives  with their spouse Lives in: House/apartment Stairs:  threshold step  Has following equipment at home: Single point cane and Walker - 2 wheeled  PLOF: Independent  PATIENT GOALS: Get energy, strength, and balance back to pre morbid function   OBJECTIVE:  Note: Objective measures were completed at Evaluation unless otherwise noted.  DIAGNOSTIC FINDINGS: MRI findings 3/22: IMPRESSION: 1. Multiple foci of acute/subacute nonhemorrhagic infarction is present throughout both cerebral hemispheres and left greater than right cerebellum. 2. Acute/subacute infarcts in the left ACA distribution. 3. Additional posteromedial parietal lobe infarcts bilaterally, left greater than right. 4. Diffuse punctate foci of acute nonhemorrhagic infarction across the ACA/MCA watershed territories bilaterally. 5. Bilateral cerebellar infarcts are more prominent on the left. 6. The constellation of findings suggest a central embolic source. 7. Mild periventricular T2 hyperintensities bilaterally likely reflect the sequela of chronic microvascular ischemia.    COGNITION: Overall cognitive status: Impaired and Pt reports his memory and sharpness are lessened since the stroke. Patient's wife/caregiver has to correct on a few occasions with some subjective information.    LOWER EXTREMITY ROM:     Within normal limits for tasks assessed  LOWER EXTREMITY MMT:    MMT Right Eval Left Eval  Hip flexion 4 4+  Hip extension    Hip abduction 4 4+  Hip adduction 4 4+  Hip internal rotation    Hip external rotation    Knee flexion 4 4+  Knee extension 4 4+  Ankle dorsiflexion 4 4+  Ankle plantarflexion    Ankle inversion    Ankle eversion    (Blank rows = not tested)  BED MOBILITY:  Not tested  TRANSFERS: Sit to stand: Complete Independence  Assistive device utilized: Single point cane     Stand to sit: Complete Independence  Assistive device utilized: None     Chair to chair: Modified independence   Assistive device utilized: None       RAMP:  Not tested  CURB:  Not tested  STAIRS: Assess in future visit  GAIT: Has slight Trendelenburg gait and slow gait speed with decreased stance time on his right lower extremity.  This is more exacerbated with use of cane and even more exacerbated with without assistive device with comparison to utilizing walker.  FUNCTIONAL TESTS:  5 times sit to stand: 23 sec Timed up and go (TUG): 20.55 sec 10 meter walk test: 14.35 sec (.69 m/s) 2WW, 17.43 sec ( .56 m/s) SPC Berg Balance Scale: 43 6 Minute Walk Test:    PATIENT SURVEYS:  Stroke Impact Scale 16 46/80  TREATMENT DATE:02/01/24   Physical Performance: Vitals:   BP: 138/66 mm Hg, HR: 73 BPM, SPO2:  6 MWT: 801'.  Discussed results and how they relate to current impairments/falls risk. Discussed norms and indications of significant improvements to understand functional mobility progression, reduced falls risk, etc.     Vitals post ambulation: BP: 151/78 mm Hg, HR: 79 BPM. 2 minutes seated rest, 134/72 mm Hg    Discussed results and how they relate to current impairments/falls risk. Discussed norms and indications of significant improvements to understand functional mobility progression, reduced falls risk, etc.   There.Ex:  Established HEP  STS: 2x8 no UE support Hip flexion: x12 at RW/LE   Hip abduction: 2x8/LE. Definite weakness in RLE abducting compared to LLE. 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 Just encouraged pt to continue those exercises and added hip abduction for home in addition.  Gait Training: Education on [employer] versus hurrycane. Reviewed correct SPC placement for RLE weakness (use of SPC in LUE). Discussed 3 point gait pattern and progression to 2 point gait pattern. Pt able to complete 150' of gait CGA progressing to supervision with excellent  completion of 3 point gait with upright posture and sequencing slowly progressing naturally to 2 point reciprocal. Occasional R veering with VC's to correct with success and x1 episode losing form/technique needing VC's to correct with good carryover. Encouraged [facility] practice at home in familiar environment with supervision if pt and spouse are comfortable performing.   PATIENT EDUCATION: Education details: POC Person educated: Patient Education method: Explanation Education comprehension: verbalized understanding   HOME EXERCISE PROGRAM: Establish visit 2    GOALS: Goals reviewed with patient? Yes  SHORT TERM GOALS: Target date: 02/27/2024       Patient will be independent in home exercise program to improve strength/mobility for better functional independence with ADLs. Baseline: No HEP currently  Goal status: INITIAL   LONG TERM GOALS: Target date: 04/23/2024    1.  Patient (> 14 years old) will complete five times sit to stand test in < 15 seconds indicating an increased LE strength and improved balance. Baseline: 23 sec Goal status: INITIAL  2.  Patient will improve SIS-16 score to 60   to demonstrate statistically significant improvement in mobility and quality of life as it relates to their mobility and QOL as a result of their CVA.  Baseline: 46 Goal status: INITIAL   3.  Patient will increase Berg Balance score by > 6 points to demonstrate decreased fall risk during functional activities. Baseline: 43 Goal status: INITIAL   4.   Patient will reduce timed up and go to <11 seconds  to reduce fall risk and demonstrate improved transfer/gait ability. Baseline: 20.55 sec no AD Goal status: INITIAL  5.   Patient will increase 10 meter walk test to >1.37m/s as to improve gait speed for better community ambulation and to reduce fall risk. Baseline: 14.35 sec (.69 m/s) 2WW, 17.43 sec ( .56 m/s) SPC Goal status: INITIAL  6.   Patient will increase six minute walk test  distance to >1000 for progression to community ambulator and improve gait ability Baseline: test visit 2, monitor VS as appropriate Goal status: INITIAL    ASSESSMENT:  CLINICAL IMPRESSION: Pt returning for first treatment session. Completion of 6 MWT with indications for falls with community based distances. Remainder of session on gait training/education and development of HEP. 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 Provided additional hip abduction to program  here for notable, proximal hip weakness with hip abductors. Pt and spouse understanding. Encouraged to practice safe SPC use in household to work on Animator. Pt has goal to be able to ambulate [place] the beach with SPC or no device in ~4 weeks. Pt remains a falls risk and need for AD for independence. Will continue to address gait and RLE weakness. Patient will benefit from skilled physical therapy to address the above impairments, improve patient's mobility, improve patient's balance and safety, improve patient's quality of life to that of his premorbid function.  OBJECTIVE IMPAIRMENTS: Abnormal gait, decreased activity tolerance, decreased balance, decreased endurance, decreased mobility, difficulty walking, decreased strength, and decreased safety awareness.   ACTIVITY LIMITATIONS: carrying, standing, squatting, and stairs  PARTICIPATION LIMITATIONS: meal prep, driving, community activity, and yard work  PERSONAL FACTORS: Age and 3+ comorbidities: hypertension migraine headaches hyperlipidemia, CKD stage III, prostate cancer, left total hip arthroplasty 09/29/2015, CAD/cardiomyopathy left bundle branch block  are also affecting patient's functional outcome.   REHAB POTENTIAL: Good  CLINICAL DECISION MAKING: Evolving/moderate complexity  EVALUATION COMPLEXITY: Moderate  PLAN:  PT FREQUENCY: 2x/week  PT DURATION: 12 weeks  PLANNED INTERVENTIONS: 97110-Therapeutic exercises, 97530- Therapeutic  activity, [id number]- Neuromuscular re-education, 97535- Self Care, [id number]- Manual therapy, [phone]- Gait training, Patient/Family education, Balance training, and Stair training  PLAN FOR NEXT SESSION: [facility] training. Unstable surfaces with can and foot clearance. Proximal R hip strength.    Marc Senior. Fairly IV, PT, DPT Physical Therapist- [place]  [facility]  02/01/2024, 4:30 PM

## 2024-02-05 ENCOUNTER — Encounter: Payer: Self-pay | Admitting: Registered Nurse

## 2024-02-05 ENCOUNTER — Ambulatory Visit

## 2024-02-05 ENCOUNTER — Encounter: Attending: Registered Nurse | Admitting: Registered Nurse

## 2024-02-05 VITALS — BP 131/78 | HR 69 | Ht 68.0 in | Wt 188.6 lb

## 2024-02-05 DIAGNOSIS — I639 Cerebral infarction, unspecified: Secondary | ICD-10-CM

## 2024-02-05 DIAGNOSIS — I10 Essential (primary) hypertension: Secondary | ICD-10-CM | POA: Diagnosis not present

## 2024-02-05 DIAGNOSIS — E7849 Other hyperlipidemia: Secondary | ICD-10-CM | POA: Diagnosis not present

## 2024-02-05 DIAGNOSIS — Z951 Presence of aortocoronary bypass graft: Secondary | ICD-10-CM | POA: Diagnosis not present

## 2024-02-05 DIAGNOSIS — R269 Unspecified abnormalities of gait and mobility: Secondary | ICD-10-CM | POA: Diagnosis not present

## 2024-02-05 MED ORDER — EZETIMIBE 10 MG PO TABS: 10.0000 mg | ORAL_TABLET | Freq: Every day | ORAL | 0 refills | Status: DC

## 2024-02-05 MED ORDER — APIXABAN 5 MG PO TABS: 5.0000 mg | ORAL_TABLET | Freq: Two times a day (BID) | ORAL | 0 refills | Status: DC

## 2024-02-05 NOTE — Progress Notes (Signed)
 Subjective:    Patient ID: Tyler David, male    DOB: 12-28-1945, 78 y.o.   MRN: [id number]  HPI: Tyler David is a 78 y.o. male who is here for HFU appointment or follow up of his Cardioembolic stroke, Essential Hypertension and Hyperlipidemia. He presented to [facility] ED on 01/04/2024, with SOB, he underwent CABG x 3 by Dr Deloise Ferries. On 01/06/2024 he developed right lower extremity weakness, Neurology consulted.   CT Head: WO Contrast:  IMPRESSION: 1. Age indeterminate likely acute/subacute infarct in the inferior left cerebellum. 2. Multiple foci of cortical infarction in the medial left frontal lobe, ACA distribution, also likely acute. 3. No acute hemorrhage.  MR Brain: WO Contrast:  IMPRESSION: 1. Multiple foci of acute/subacute nonhemorrhagic infarction is present throughout both cerebral hemispheres and left greater than right cerebellum. 2. Acute/subacute infarcts in the left ACA distribution. 3. Additional posteromedial parietal lobe infarcts bilaterally, left greater than right. 4. Diffuse punctate foci of acute nonhemorrhagic infarction across the ACA/MCA watershed territories bilaterally. 5. Bilateral cerebellar infarcts are more prominent on the left. 6. The constellation of findings suggest a central embolic source. 7. Mild periventricular T2 hyperintensities bilaterally likely reflect the sequela of chronic microvascular ischemia.   Mr. Myhand was admitted to inpatient rehabilitation on 01/12/2024, he was discharged on 01/23/2024. He is receiving outpatient therapy at [facility]. He denies any pain . He rates his pain 0.  He arises from Table with walker.   Wife in room.     Pain Inventory Average Pain 0 Pain Right Now 0  BOWEL Number of stools per week: 7 Oral laxative use Yes  Type of laxative Dulcolax  BLADDER Normal  Mobility use a cane how many minutes can you walk? 10-15 ability to climb steps?  yes do you drive?   no  Function retired  Neuro/Psych weakness  Prior Studies Any changes since last visit?  no  He is going to [facility] rehab @ [facility] and has seen his cardiologist  Physicians involved in your care Any changes since last visit?  no   Family History  Problem Relation Age of Onset   Ovarian cancer Mother    Heart failure Father    COPD Father    Atrial fibrillation Brother    Prostate cancer Brother    Social History   Socioeconomic History   Marital status: Married    Spouse name: Not on file   Number of children: 2   Years of education: Not on file   Highest education level: Not on file  Occupational History   Not on file  Tobacco Use   Smoking status: Never   Smokeless tobacco: Never  Vaping Use   Vaping status: Never Used  Substance and Sexual Activity   Alcohol use: Not Currently   Drug use: No   Sexual activity: Not on file  Other Topics Concern   Not on file  Social History Narrative   Lives with [place], wife, and indoor pet-dog.    Social Drivers of Corporate investment banker Strain: Not on file  Food Insecurity: No Food Insecurity (01/04/2024)   Hunger Vital Sign    Worried About Running Out of Food in the Last Year: Never true    Ran Out of Food in the Last Year: Never true  Transportation Needs: No Transportation Needs (01/04/2024)   PRAPARE - Administrator, Civil Service (Medical): No    Lack of Transportation (Non-Medical): No  Physical Activity:  Not on file  Stress: Not on file  Social Connections: Socially Integrated (01/04/2024)   Social Connection and Isolation Panel [NHANES]    Frequency of Communication with Friends and Family: More than three times a week    Frequency of Social Gatherings with Friends and Family: Three times a week    Attends Religious Services: More than 4 times per year    Active Member of Clubs or Organizations: Yes    Attends Engineer, structural: More than 4 times per year    Marital  Status: Married   Past Surgical History:  Procedure Laterality Date   CARPAL TUNNEL RELEASE Bilateral    CORONARY ARTERY BYPASS GRAFT N/A 01/04/2024   Procedure: CORONARY ARTERY BYPASS GRAFTING TIMES THREE USING LEFT INTERNAL MAMMARY ARTERY AND RIGHT GREAT SAPHENOUS VEIN HARVESTED ENDOSCOPICALLY;  Surgeon: Hilarie Lovely, MD;  Location: [facility] OR;  Service: Open Heart Surgery;  Laterality: N/A;   INTRAOPERATIVE TRANSESOPHAGEAL ECHOCARDIOGRAM N/A 01/04/2024   Procedure: ECHOCARDIOGRAM, TRANSESOPHAGEAL, INTRAOPERATIVE;  Surgeon: Hilarie Lovely, MD;  Location: [facility];  Service: Open Heart Surgery;  Laterality: N/A;   PROSTATE SURGERY     RIGHT/LEFT HEART CATH AND CORONARY ANGIOGRAPHY Bilateral 12/15/2023   Procedure: RIGHT/LEFT HEART CATH AND CORONARY ANGIOGRAPHY;  Surgeon: Sammy Crisp, MD;  Location: [facility] INVASIVE CV LAB;  Service: Cardiovascular;  Laterality: Bilateral;   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   Procedure: LEFT TOTAL HIP ARTHROPLASTY ANTERIOR APPROACH;  Surgeon: Claiborne Crew, MD;  Location: [facility];  Service: Orthopedics;  Laterality: Left;   Past Medical History:  Diagnosis Date   Arthritis    Cancer (HCC)    prostate cancer    Cardiomyopathy (HCC)    Chronic kidney disease, stage 3 (HCC)    Coronary artery disease    ED (erectile dysfunction)    GERD (gastroesophageal reflux disease)    Headache    hx of migraine-2015    Hyperlipidemia    Hypertension    Hypothyroidism    LBBB (left bundle branch block)    Migraine    Nocturia    Overactive bladder    Pneumonia    hx of at age 14    BP (!) 158/7   Pulse 69   Ht 5\' 8"  (1.727 m)   Wt 188 lb 9.6 oz (85.5 kg)   SpO2 98%   BMI 28.68 kg/m   Opioid Risk Score:   Fall Risk Score:  `1  Depression screen PHQ 2/9     02/05/2024    2:10 PM 06/07/2018   11:56 AM  Depression screen PHQ 2/9  Decreased Interest 0 0  Down, Depressed, Hopeless 0 0  PHQ - 2 Score 0 0  Altered sleeping 0   Tired,  decreased energy 3   Change in appetite 0   Feeling bad or failure about yourself  0   Trouble concentrating 0   Moving slowly or fidgety/restless 0   Suicidal thoughts 0   PHQ-9 Score 3   Difficult doing work/chores Somewhat difficult      Review of Systems  Musculoskeletal:  Positive for gait problem.  Neurological:  Positive for weakness.  All other systems reviewed and are negative.      Objective:   Physical Exam Vitals and nursing note reviewed.  Constitutional:      Appearance: Normal appearance.  Cardiovascular:     Rate and Rhythm: Normal rate and regular rhythm.     Pulses: Normal pulses.     Heart sounds: Normal  heart sounds.  Pulmonary:     Effort: Pulmonary effort is normal.     Breath sounds: Normal breath sounds.  Musculoskeletal:     Comments: Normal Muscle Bulk and Muscle Testing Reveals:  Upper Extremities: Full ROM and Muscle Strength  Right 5/5. Left: 4/5  Lower Extremities: Full ROM and Muscle Strength 5/5 Arises from Table slowly using walker or support Narrow Based  Gait     Skin:    General: Skin is warm and dry.  Neurological:     Mental Status: He is alert and oriented to person, place, and time.  Psychiatric:        Mood and Affect: Mood normal.        Behavior: Behavior normal.         Assessment & Plan:  Cardioembolic Stroke: Continue Outpatient Therapy at [facility]. Continue current medication regimen. Continue to Monitor. Has a scheduled appointment with Neurology.  2. CAD: S/P CABG X3: Dr Deloise Ferries:  CORONARY ARTERY BYPASS GRAFTING TIMES THREE USING LEFT INTERNAL MAMMARY ARTERY AND RIGHT GREAT SAPHENOUS VEIN HARVESTED ENDOSCOPICALLY N/A General  ECHOCARDIOGRAM, TRANSESOPHAGEAL, INTRAOPERATIVE 3. Essential Hypertension: Continue current medication regimen. PCP following.  4. Hyperlipidemia: Continue current medication regimen. PCP following N/A General   F/U with Dr Dorn Gaskins in 4- 6 weeks

## 2024-02-06 ENCOUNTER — Ambulatory Visit: Admitting: Physical Therapy

## 2024-02-06 DIAGNOSIS — M6281 Muscle weakness (generalized): Secondary | ICD-10-CM

## 2024-02-06 DIAGNOSIS — R269 Unspecified abnormalities of gait and mobility: Secondary | ICD-10-CM | POA: Diagnosis not present

## 2024-02-06 DIAGNOSIS — R2689 Other abnormalities of gait and mobility: Secondary | ICD-10-CM

## 2024-02-06 DIAGNOSIS — R262 Difficulty in walking, not elsewhere classified: Secondary | ICD-10-CM

## 2024-02-06 DIAGNOSIS — R2681 Unsteadiness on feet: Secondary | ICD-10-CM

## 2024-02-06 NOTE — Therapy (Signed)
 OUTPATIENT PHYSICAL THERAPY NEURO TREATMENT   Patient Name: Tyler David MRN: [id number] DOB:Jan 04, 1946, 78 y.o., male Today's Date: 02/06/2024   PCP:   Tyler Congo, MD   REFERRING PROVIDER:    Sterling Eisenmenger, PA-C    END OF SESSION:   PT End of Session - 02/06/24 1406     Visit Number 3    Number of Visits 24    Date for PT Re-Evaluation 04/23/24    Progress Note Due on Visit 10    PT Start Time 1406    PT Stop Time 1445    PT Time Calculation (min) 39 min    Equipment Utilized During Treatment Gait belt    Activity Tolerance Patient tolerated treatment well    Behavior During Therapy WFL for tasks assessed/performed              Past Medical History:  Diagnosis Date   Arthritis    Cancer (HCC)    prostate cancer    Cardiomyopathy (HCC)    Chronic kidney disease, stage 3 (HCC)    Coronary artery disease    ED (erectile dysfunction)    GERD (gastroesophageal reflux disease)    Headache    hx of migraine-2015    Hyperlipidemia    Hypertension    Hypothyroidism    LBBB (left bundle branch block)    Migraine    Nocturia    Overactive bladder    Pneumonia    hx of at age 19    Past Surgical History:  Procedure Laterality Date   CARPAL TUNNEL RELEASE Bilateral    CORONARY ARTERY BYPASS GRAFT N/A 01/04/2024   Procedure: CORONARY ARTERY BYPASS GRAFTING TIMES THREE USING LEFT INTERNAL MAMMARY ARTERY AND RIGHT GREAT SAPHENOUS VEIN HARVESTED ENDOSCOPICALLY;  Surgeon: Tyler Lovely, MD;  Location: [facility] OR;  Service: Open Heart Surgery;  Laterality: N/A;   INTRAOPERATIVE TRANSESOPHAGEAL ECHOCARDIOGRAM N/A 01/04/2024   Procedure: ECHOCARDIOGRAM, TRANSESOPHAGEAL, INTRAOPERATIVE;  Surgeon: Tyler Lovely, MD;  Location: [facility] OR;  Service: Open Heart Surgery;  Laterality: N/A;   PROSTATE SURGERY     RIGHT/LEFT HEART CATH AND CORONARY ANGIOGRAPHY Bilateral 12/15/2023   Procedure: RIGHT/LEFT HEART CATH AND CORONARY ANGIOGRAPHY;  Surgeon: Tyler Crisp, MD;  Location: [facility];  Service: Cardiovascular;  Laterality: Bilateral;   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   Procedure: LEFT TOTAL HIP ARTHROPLASTY ANTERIOR APPROACH;  Surgeon: Tyler Crew, MD;  Location: [facility];  Service: Orthopedics;  Laterality: Left;   Patient Active Problem List   Diagnosis Date Noted   Cardioembolic stroke (HCC) 01/12/2024   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   Angina pectoris (HCC) 12/15/2023   HFrEF (heart failure with reduced ejection fraction) (HCC) 12/15/2023   Palpitations 12/16/2019   PVC's (premature ventricular contractions) 12/16/2019   Left bundle branch block 12/16/2019   Essential hypertension 12/16/2019   Osteoarthritis of knee 06/07/2018   S/P left THA, 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    ONSET DATE: 01/12/24  REFERRING DIAG: I63.9 (ICD-10-CM) - Cardioembolic stroke (HCC)   THERAPY DIAG:  Abnormality of gait and mobility  Difficulty in walking, not elsewhere classified  Muscle weakness (generalized)  Other abnormalities of gait and mobility  Unsteadiness on feet  Rationale for Evaluation and Treatment: Rehabilitation  SUBJECTIVE:  SUBJECTIVE STATEMENT:  Pt reports he has been doing OK, reporting he is "slowly, but surely been getting better." Denies stumbles/falls since last visit. Denies pain.  Pt reports using SPC primarily, but occasional use of RW when he gets tired. Reports he went shopping at [employer] today and used the shopping cart for support throughout the garden center area and did well.  Pt reports overall his greatest problem is he has "no energy." Pt states he also is struggling with allergies.  Reports planning on going to beach on May 14th, needing to navigate the sand. They have a camper and will need to navigate the steps  to enter with rail only on L side to enter.  Pt reports he has noticed when he wakes up in the morning having bilateral hip pain, asking if it could be related to medications - educated pt to discuss this with his MD.  Follow-up appts with MD: May 5th with cardiac surgeon and May 27th with cardiology PA   From Initial Eval: [name] Pt finds standing regularly a challenge, has difficulty with bladder control, especially now that he is on lasix .   Pt accompanied by: significant other Tyler David   PERTINENT HISTORY: Tyler David is a 78 y.o. 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   PAIN:  Are you having pain? No  PRECAUTIONS: Fall, wearing Zio patch heart monitor  RED FLAGS: None   WEIGHT BEARING RESTRICTIONS: No  FALLS: Has patient fallen in last 6 months? No  LIVING ENVIRONMENT: Lives with: lives with their family and lives with their spouse Lives in: House/apartment Stairs:  threshold step  Has following equipment at home: Single point cane and Walker - 2 wheeled  PLOF: Independent  PATIENT GOALS: Get energy, strength, and balance back to pre morbid function   OBJECTIVE:  Note: Objective measures were completed at Evaluation unless otherwise noted.  DIAGNOSTIC FINDINGS: MRI findings 3/22: IMPRESSION: 1. Multiple foci of acute/subacute nonhemorrhagic infarction is present throughout both cerebral hemispheres and left greater than right cerebellum. 2. Acute/subacute infarcts in the left ACA distribution. 3. Additional posteromedial parietal lobe infarcts bilaterally, left greater than right. 4. Diffuse punctate foci of acute nonhemorrhagic  infarction across the ACA/MCA watershed territories bilaterally. 5. Bilateral cerebellar infarcts are more prominent on the left. 6. The constellation of findings suggest a central embolic source. 7. Mild periventricular T2 hyperintensities bilaterally likely reflect the sequela of chronic microvascular ischemia.    COGNITION: Overall cognitive status: Impaired and Pt reports his memory and sharpness are lessened since the stroke. Patient's wife/caregiver has to correct on a few occasions with some subjective information.    LOWER EXTREMITY ROM:     Within normal limits for tasks assessed  LOWER EXTREMITY MMT:    MMT Right Eval Left Eval  Hip flexion 4 4+  Hip extension    Hip abduction 4 4+  Hip adduction 4 4+  Hip internal rotation    Hip external rotation    Knee flexion 4 4+  Knee extension 4 4+  Ankle dorsiflexion 4 4+  Ankle plantarflexion    Ankle inversion    Ankle eversion    (Blank rows = not tested)  BED MOBILITY:  Not tested  TRANSFERS: Sit to stand: Complete Independence  Assistive device utilized: Single point cane     Stand to sit: Complete Independence  Assistive device utilized: None     Chair to chair: Modified independence  Assistive device utilized: None       RAMP:  Not tested  CURB:  Not tested  STAIRS: Assess in future visit  GAIT: Has slight Trendelenburg gait and slow gait speed with decreased stance time on his right lower extremity.  This is more exacerbated with use of cane and even more exacerbated with without assistive device with comparison to utilizing walker.  FUNCTIONAL TESTS:  5 times sit to stand: 23 sec Timed up and go (TUG): 20.55 sec 10 meter walk test: 14.35 sec (.69 m/s) 2WW, 17.43 sec ( .56 m/s) SPC Berg Balance Scale: 43 6 Minute Walk Test:    PATIENT SURVEYS:  Stroke Impact Scale 16 46/80                                                                                                                               TREATMENT DATE: 02/06/24   Monitored vitals throughout session given pt's extensive cardiac history  Vitals assessed, in sitting, throughout session: Sitting to start: BP 146/63 (MAP 89), HR 68bpm  After 1st gait trial: BP 165/67 (MAP 96), HR 73bpm, SpO2 100% After 3 minute seated rest break: BP 139/64 (MAP 87), HR 68bpm  After backwards gait and stairs: BP 158/67 (MAP 91), HR 70bpm  *Pt denies any symptoms *Of note: pt wearing zio heart monitor  *Educated pt on taking BP at least 1x daily after medication administration, per prescription, and keep a diary of it to take to his cardiology follow-up appointments   Gait training 425ft, no AD nor UE support, with light min assist for steadying - noticed slight R hip trendelenburg during stance due to weakness - cuing throughout to increase gait speed for increased intensity and to build up endurance. Achieves reciprocal stepping pattern with adequate gait speed and no overt LOB or instability noted.  Wife brought in pt's HEP from [facility] - copied below and to be advanced at next visit  Dynamic gait training including forward/backwards ~16ft each x3 reps - no UE support - CGA/light min A for steadying - cuing to achieve more symmetrical reciprocal steps backwards as pt tends to take shorter L step lengths with decreased R stance time  Stair navigation training ascending/descending 4 steps x3 reps without break (6" height) using only L HR to simulate their camper stairs with light min assist for balance - cuing to perform reciprocal stepping pattern with good power up during ascent, but difficulty with eccentric control on descent when stepping down leading with L LE due to R LE weakness.  Provided seated rest breaks throughout session during vital sign assessment.  Dynamic balance task of alternating foot taps to 4" step, no UE support, x12 reps with CGA for safety/steadying due to minor  R lean/LOB - pt with slight difficulty with this task,  but definitely could progress to a more appropriate challenge level at next visit.      PATIENT EDUCATION: Education details: POC Person educated: Patient Education method: Explanation Education comprehension: verbalized understanding   HOME EXERCISE PROGRAM:  Access Code: [email or web address] URL: [email or web address] Date: 02/06/2024 Prepared by: Carlen Chasten  Exercises - Sit to Stand Without Arm Support  - 1 x daily - 7 x weekly - 3 sets - 10 reps - Standing March with Counter Support  - 1 x daily - 7 x weekly - 3 sets - 20 reps - Heel Raises with Counter Support  - 1 x daily - 7 x weekly - 3 sets - 10 reps - Standing Hamstring Curl with RW  - 1 x daily - 7 x weekly - 3 sets - 10 reps - Backward Walking with Counter Support  - 1 x daily - 7 x weekly - 3 sets - 10 reps   GOALS: Goals reviewed with patient? Yes  SHORT TERM GOALS: Target date: 02/27/2024       Patient will be independent in home exercise program to improve strength/mobility for better functional independence with ADLs. Baseline: No HEP currently  Goal status: INITIAL   LONG TERM GOALS: Target date: 04/23/2024    1.  Patient (> 68 years old) will complete five times sit to stand test in < 15 seconds indicating an increased LE strength and improved balance. Baseline: 23 sec Goal status: INITIAL  2.  Patient will improve SIS-16 score to 60   to demonstrate statistically significant improvement in mobility and quality of life as it relates to their mobility and QOL as a result of their CVA.  Baseline: 46 Goal status: INITIAL   3.  Patient will increase Berg Balance score by > 6 points to demonstrate decreased fall risk during functional activities. Baseline: 43 Goal status: INITIAL   4.   Patient will reduce timed up and go to <11 seconds  to reduce fall risk and demonstrate improved transfer/gait ability. Baseline: 20.55 sec no AD Goal status: INITIAL  5.   Patient will increase 10 meter  walk test to >1.82m/s as to improve gait speed for better community ambulation and to reduce fall risk. Baseline: 14.35 sec (.69 m/s) 2WW, 17.43 sec ( .56 m/s) SPC Goal status: INITIAL  6.   Patient will increase six minute walk test distance to >1000 for progression to community ambulator and improve gait ability Baseline: 02/01/2024: 821ft unsure of AD usage Goal status: INITIAL    ASSESSMENT:  CLINICAL IMPRESSION:  Patient is a 78 year old male who presents to physical therapy following a cerebrovascular accident as well as a procedure for heart revascularization that happened 2 days prior to his CVA. Patient demonstrates continuous improvement in R LE strength as well as cardiovascular endurance. Therapist continued to monitor vitals throughout session with noticeable increase in BP after activity; therefore, seated rest breaks provided to allow BP recovery prior to continuation of next intervention. Therapist educated pt on importance of monitoring his BP daily and keeping a BP "diary" to show his cardiology team at follow-up visits. Patient participated in gait training without AD today and had improvement in R LE gait mechanics with no overt LOB. Patient will benefit from continued progression to higher level dynamic balance and gait training interventions with specific interventions to target activities he will need to be able to perform for upcoming beach vacation trip. Mr. Gervacio will benefit from  continued skilled physical therapy to address the above impairments, improve patient's mobility, improve patient's balance and safety, improve patient's quality of life to that of his premorbid function.  OBJECTIVE IMPAIRMENTS: Abnormal gait, decreased activity tolerance, decreased balance, decreased endurance, decreased mobility, difficulty walking, decreased strength, and decreased safety awareness.   ACTIVITY LIMITATIONS: carrying, standing, squatting, and stairs  PARTICIPATION LIMITATIONS:  meal prep, driving, community activity, and yard work  PERSONAL FACTORS: Age and 3+ comorbidities: hypertension migraine headaches hyperlipidemia, CKD stage III, prostate cancer, left total hip arthroplasty 09/29/2015, CAD/cardiomyopathy left bundle branch block  are also affecting patient's functional outcome.   REHAB POTENTIAL: Good  CLINICAL DECISION MAKING: Evolving/moderate complexity  EVALUATION COMPLEXITY: Moderate  PLAN:  PT FREQUENCY: 2x/week  PT DURATION: 12 weeks  PLANNED INTERVENTIONS: 97110-Therapeutic exercises, 97530- Therapeutic activity, 97112- Neuromuscular re-education, 97535- Self Care, 16109- Manual therapy, [phone]- Gait training, Patient/Family education, Balance training, and Stair training  PLAN FOR NEXT SESSION:  - update HEP (the one from [facility] is copied above in HEP section)  - higher level dynamic gait and balance challenges  - gait training on unstable surfaces to prepare for walking on sand at beach  - stair navigation training using only L HR support to prepare for camper entrance  - [facility] training to determine when safe to use that out in community  - Unstable surfaces with cane and foot clearance - Proximal R hip strengthening    Alisah Grandberry, PT, DPT, NCS, CSRS Physical Therapist - [facility]  [facility]  2:52 PM 02/06/24

## 2024-02-08 ENCOUNTER — Encounter: Payer: Self-pay | Admitting: Emergency Medicine

## 2024-02-08 ENCOUNTER — Telehealth: Payer: Self-pay | Admitting: Physician Assistant

## 2024-02-08 ENCOUNTER — Ambulatory Visit

## 2024-02-08 DIAGNOSIS — R269 Unspecified abnormalities of gait and mobility: Secondary | ICD-10-CM | POA: Diagnosis not present

## 2024-02-08 DIAGNOSIS — R2689 Other abnormalities of gait and mobility: Secondary | ICD-10-CM

## 2024-02-08 DIAGNOSIS — M6281 Muscle weakness (generalized): Secondary | ICD-10-CM

## 2024-02-08 DIAGNOSIS — R262 Difficulty in walking, not elsewhere classified: Secondary | ICD-10-CM

## 2024-02-08 DIAGNOSIS — R2681 Unsteadiness on feet: Secondary | ICD-10-CM

## 2024-02-08 MED ORDER — METOPROLOL TARTRATE 25 MG PO TABS: 12.5000 mg | ORAL_TABLET | Freq: Two times a day (BID) | ORAL | 0 refills | Status: DC

## 2024-02-08 MED ORDER — AMIODARONE HCL 200 MG PO TABS
200.0000 mg | ORAL_TABLET | Freq: Every day | ORAL | 0 refills | Status: DC
Start: 2024-02-08 — End: 2024-03-12

## 2024-02-08 NOTE — Therapy (Signed)
 OUTPATIENT PHYSICAL THERAPY NEURO TREATMENT   Patient Name: Tyler David MRN: [id number] DOB:[birth year], 78 y.o., male Today's Date: 02/08/2024   PCP:   Tyler Congo, MD   REFERRING PROVIDER:    Sterling Eisenmenger, PA-C    END OF SESSION:   PT End of Session - 02/08/24 1410     Visit Number 4    Number of Visits 24    Date for PT Re-Evaluation 04/23/24    Progress Note Due on Visit 10    PT Start Time 1402    PT Stop Time 1443    PT Time Calculation (min) 41 min    Equipment Utilized During Treatment Gait belt    Activity Tolerance Patient tolerated treatment well    Behavior During Therapy WFL for tasks assessed/performed               Past Medical History:  Diagnosis Date   Arthritis    Cancer (HCC)    prostate cancer    Cardiomyopathy (HCC)    Chronic kidney disease, stage 3 (HCC)    Coronary artery disease    ED (erectile dysfunction)    GERD (gastroesophageal reflux disease)    Headache    hx of migraine-2015    Hyperlipidemia    Hypertension    Hypothyroidism    LBBB (left bundle branch block)    Migraine    Nocturia    Overactive bladder    Pneumonia    hx of at age 54    Past Surgical History:  Procedure Laterality Date   CARPAL TUNNEL RELEASE Bilateral    CORONARY ARTERY BYPASS GRAFT N/A 01/04/2024   Procedure: CORONARY ARTERY BYPASS GRAFTING TIMES THREE USING LEFT INTERNAL MAMMARY ARTERY AND RIGHT GREAT SAPHENOUS VEIN HARVESTED ENDOSCOPICALLY;  Surgeon: Tyler Lovely, MD;  Location: [facility] OR;  Service: Open Heart Surgery;  Laterality: N/A;   INTRAOPERATIVE TRANSESOPHAGEAL ECHOCARDIOGRAM N/A 01/04/2024   Procedure: ECHOCARDIOGRAM, TRANSESOPHAGEAL, INTRAOPERATIVE;  Surgeon: Tyler Lovely, MD;  Location: [facility] OR;  Service: Open Heart Surgery;  Laterality: N/A;   PROSTATE SURGERY     RIGHT/LEFT HEART CATH AND CORONARY ANGIOGRAPHY Bilateral 12/15/2023   Procedure: RIGHT/LEFT HEART CATH AND CORONARY ANGIOGRAPHY;  Surgeon: Tyler Crisp, MD;  Location: [facility];  Service: Cardiovascular;  Laterality: Bilateral;   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   Procedure: LEFT TOTAL HIP ARTHROPLASTY ANTERIOR APPROACH;  Surgeon: Tyler Crew, MD;  Location: [facility];  Service: Orthopedics;  Laterality: Left;   Patient Active Problem List   Diagnosis Date Noted   Cardioembolic stroke (HCC) 01/12/2024   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   Angina pectoris (HCC) 12/15/2023   HFrEF (heart failure with reduced ejection fraction) (HCC) 12/15/2023   Palpitations 12/16/2019   PVC's (premature ventricular contractions) 12/16/2019   Left bundle branch block 12/16/2019   Essential hypertension 12/16/2019   Osteoarthritis of knee 06/07/2018   S/P left THA, 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    ONSET DATE: 01/12/24  REFERRING DIAG: I63.9 (ICD-10-CM) - Cardioembolic stroke (HCC)   THERAPY DIAG:  Abnormality of gait and mobility  Difficulty in walking, not elsewhere classified  Muscle weakness (generalized)  Other abnormalities of gait and mobility  Unsteadiness on feet  Rationale for Evaluation and Treatment: Rehabilitation  SUBJECTIVE:  SUBJECTIVE STATEMENT:  Patient reports still dealing with some hip soreness/pain - mostly in the morning when I first way wake up.  Pt reports overall his greatest problem weaknes Pt states he also is struggling with allergies.  Reports planning on going to beach on May 14th, needing to navigate the sand. They have a camper and will need to navigate the steps to enter with rail only on L side to enter.  Pt reports he has noticed when he wakes up in the morning having bilateral hip pain, asking if it could be related to medications - educated pt to discuss this with his MD.  Follow-up appts with MD: May 5th with cardiac  surgeon and May 27th with cardiology PA   From Initial Eval: [name] Pt finds standing regularly a challenge, has difficulty with bladder control, especially now that he is on lasix .   Pt accompanied by: significant other Tyler David   PERTINENT HISTORY: Tyler David is a 78 y.o. 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   PAIN:  Are you having pain? No  PRECAUTIONS: Fall, wearing Zio patch heart monitor  RED FLAGS: None   WEIGHT BEARING RESTRICTIONS: No  FALLS: Has patient fallen in last 6 months? No  LIVING ENVIRONMENT: Lives with: lives with their family and lives with their spouse Lives in: House/apartment Stairs:  threshold step  Has following equipment at home: Single point cane and Walker - 2 wheeled  PLOF: Independent  PATIENT GOALS: Get energy, strength, and balance back to pre morbid function   OBJECTIVE:  Note: Objective measures were completed at Evaluation unless otherwise noted.  DIAGNOSTIC FINDINGS: MRI findings 3/22: IMPRESSION: 1. Multiple foci of acute/subacute nonhemorrhagic infarction is present throughout both cerebral hemispheres and left greater than right cerebellum. 2. Acute/subacute infarcts in the left ACA distribution. 3. Additional posteromedial parietal lobe infarcts bilaterally, left greater than right. 4. Diffuse punctate foci of acute nonhemorrhagic infarction across the ACA/MCA watershed territories bilaterally. 5. Bilateral cerebellar infarcts are more prominent on the left. 6. The constellation of findings suggest a central embolic source. 7. Mild periventricular T2 hyperintensities bilaterally  likely reflect the sequela of chronic microvascular ischemia.    COGNITION: Overall cognitive status: Impaired and Pt reports his memory and sharpness are lessened since the stroke. Patient's wife/caregiver has to correct on a few occasions with some subjective information.    LOWER EXTREMITY ROM:     Within normal limits for tasks assessed  LOWER EXTREMITY MMT:    MMT Right Eval Left Eval  Hip flexion 4 4+  Hip extension    Hip abduction 4 4+  Hip adduction 4 4+  Hip internal rotation    Hip external rotation    Knee flexion 4 4+  Knee extension 4 4+  Ankle dorsiflexion 4 4+  Ankle plantarflexion    Ankle inversion    Ankle eversion    (Blank rows = not tested)  BED MOBILITY:  Not tested  TRANSFERS: Sit to stand: Complete Independence  Assistive device utilized: Single point cane     Stand to sit: Complete Independence  Assistive device utilized: None     Chair to chair: Modified independence  Assistive device utilized: None  RAMP:  Not tested  CURB:  Not tested  STAIRS: Assess in future visit  GAIT: Has slight Trendelenburg gait and slow gait speed with decreased stance time on his right lower extremity.  This is more exacerbated with use of cane and even more exacerbated with without assistive device with comparison to utilizing walker.  FUNCTIONAL TESTS:  5 times sit to stand: 23 sec Timed up and go (TUG): 20.55 sec 10 meter walk test: 14.35 sec (.69 m/s) 2WW, 17.43 sec ( .56 m/s) SPC Berg Balance Scale: 43 6 Minute Walk Test:    PATIENT SURVEYS:  Stroke Impact Scale 16 46/80                                                                                                                              TREATMENT DATE: 02/08/24   Monitored vitals throughout session given pt's extensive cardiac history  Vitals assessed, in sitting, throughout session: Sitting to start: BP 146/8mmHg, HR 68bpm   Therapeutic Activities: dynamic therapeutic  activities  designed to achieve improved functional performance   2 min step tap onto 12" block with BUE Support - Patient reported as "Tiring"   Gait training ~430ft, no AD, CGA and use of gait belt with Verbal cues for more reciprocal stepping pattern with adequate gait speed exhibiting no LOB or significant unsteadiness today- only fatigue noted.  Sit to stand - (VC for specific technique) without UE support x 10 reps- Improved ability once form improved.  Dynamic side step up/over half foam without UE support x 15 each direction.  Forward/backward up/over half foam without UE support x 15 reps (difficulty with retro steps only)   Side Step in // bars with RTB around ankles down and back x 3 (focusing on wider steps as possible)    Provided seated rest breaks throughout session during vital sign assessment.        PATIENT EDUCATION: Education details: POC Person educated: Patient Education method: Explanation Education comprehension: verbalized understanding   HOME EXERCISE PROGRAM:  Access Code: [email or web address] URL: [email or web address] Date: 02/06/2024 Prepared by: Carlen Chasten  Exercises - Sit to Stand Without Arm Support  - 1 x daily - 7 x weekly - 3 sets - 10 reps - Standing March with Counter Support  - 1 x daily - 7 x weekly - 3 sets - 20 reps - Heel Raises with Counter Support  - 1 x daily - 7 x weekly - 3 sets - 10 reps - Standing Hamstring Curl with RW  - 1 x daily - 7 x weekly - 3 sets - 10 reps - Backward Walking with Counter Support  - 1 x daily - 7 x weekly - 3 sets - 10 reps   GOALS: Goals reviewed with patient? Yes  SHORT TERM GOALS: Target date: 02/27/2024       Patient will be independent in home exercise program to improve strength/mobility for better functional independence with ADLs. Baseline: No  HEP currently  Goal status: INITIAL   LONG TERM GOALS: Target date: 04/23/2024    1.  Patient (> 32 years old) will complete five  times sit to stand test in < 15 seconds indicating an increased LE strength and improved balance. Baseline: 23 sec Goal status: INITIAL  2.  Patient will improve SIS-16 score to 60   to demonstrate statistically significant improvement in mobility and quality of life as it relates to their mobility and QOL as a result of their CVA.  Baseline: 46 Goal status: INITIAL   3.  Patient will increase Berg Balance score by > 6 points to demonstrate decreased fall risk during functional activities. Baseline: 43 Goal status: INITIAL   4.   Patient will reduce timed up and go to <11 seconds  to reduce fall risk and demonstrate improved transfer/gait ability. Baseline: 20.55 sec no AD Goal status: INITIAL  5.   Patient will increase 10 meter walk test to >1.70m/s as to improve gait speed for better community ambulation and to reduce fall risk. Baseline: 14.35 sec (.69 m/s) 2WW, 17.43 sec ( .56 m/s) SPC Goal status: INITIAL  6.   Patient will increase six minute walk test distance to >1000 for progression to community ambulator and improve gait ability Baseline: 02/01/2024: 84ft unsure of AD usage Goal status: INITIAL    ASSESSMENT:  CLINICAL IMPRESSION:  Patient is a 78 year old male who presents to physical therapy following a cerebrovascular accident as well as a procedure for heart revascularization that happened 2 days prior to his CVA. Patient demonstrates continuous improvement gait with less unsteadiness today and improving functional endurance. Able to incorporate more dynamic activities with fatigue as most limiting factor. Mr. Stockman will benefit from continued skilled physical therapy to address the above impairments, improve patient's mobility, improve patient's balance and safety, improve patient's quality of life to that of his premorbid function.  OBJECTIVE IMPAIRMENTS: Abnormal gait, decreased activity tolerance, decreased balance, decreased endurance, decreased mobility,  difficulty walking, decreased strength, and decreased safety awareness.   ACTIVITY LIMITATIONS: carrying, standing, squatting, and stairs  PARTICIPATION LIMITATIONS: meal prep, driving, community activity, and yard work  PERSONAL FACTORS: Age and 3+ comorbidities: hypertension migraine headaches hyperlipidemia, CKD stage III, prostate cancer, left total hip arthroplasty 09/29/2015, CAD/cardiomyopathy left bundle branch block  are also affecting patient's functional outcome.   REHAB POTENTIAL: Good  CLINICAL DECISION MAKING: Evolving/moderate complexity  EVALUATION COMPLEXITY: Moderate  PLAN:  PT FREQUENCY: 2x/week  PT DURATION: 12 weeks  PLANNED INTERVENTIONS: 97110-Therapeutic exercises, 97530- Therapeutic activity, 97112- Neuromuscular re-education, 97535- Self Care, 16109- Manual therapy, [phone]- Gait training, Patient/Family education, Balance training, and Stair training  PLAN FOR NEXT SESSION:  -  Continue update HEP (the one from [facility] is copied above in HEP section)  - higher level dynamic gait and balance challenges  - gait training on unstable surfaces to prepare for walking on sand at beach  - stair navigation training using only L HR support to prepare for camper entrance  - [facility] training to determine when safe to use that out in community  - Unstable surfaces with cane and foot clearance - Proximal R hip strengthening    Ossie Blend, PT Physical Therapist - [facility]  4:13 PM 02/08/24

## 2024-02-08 NOTE — Telephone Encounter (Signed)
*  STAT* If patient is at the pharmacy, call can be transferred to refill team.   1. Which medications need to be refilled? (please list name of each medication and dose if known) amiodarone  (PACERONE ) 200 MG tablet   metoprolol  tartrate (LOPRESSOR ) 25 MG tablet   2. Which pharmacy/location (including street and city if local pharmacy) is medication to be sent to? [facility] [address] [place], [place] - [address] Phone: [phone]  Fax: [phone]     3. Do they need a 30 day or 90 day supply? 90

## 2024-02-08 NOTE — Telephone Encounter (Signed)
 This is a Educational psychologist pt

## 2024-02-13 ENCOUNTER — Ambulatory Visit

## 2024-02-13 ENCOUNTER — Ambulatory Visit: Admitting: Physician Assistant

## 2024-02-13 DIAGNOSIS — R2681 Unsteadiness on feet: Secondary | ICD-10-CM

## 2024-02-13 DIAGNOSIS — R269 Unspecified abnormalities of gait and mobility: Secondary | ICD-10-CM | POA: Diagnosis not present

## 2024-02-13 DIAGNOSIS — R262 Difficulty in walking, not elsewhere classified: Secondary | ICD-10-CM

## 2024-02-13 DIAGNOSIS — I639 Cerebral infarction, unspecified: Secondary | ICD-10-CM

## 2024-02-13 DIAGNOSIS — R2689 Other abnormalities of gait and mobility: Secondary | ICD-10-CM

## 2024-02-13 DIAGNOSIS — M6281 Muscle weakness (generalized): Secondary | ICD-10-CM

## 2024-02-13 NOTE — Therapy (Signed)
 OUTPATIENT PHYSICAL THERAPY NEURO TREATMENT   Patient Name: Tyler David MRN: [id number] DOB:03-02-1946, 78 y.o., male Today's Date: 02/13/2024   PCP:   Tyler Congo, MD   REFERRING PROVIDER:    Sterling Eisenmenger, PA-C    END OF SESSION:   PT End of Session - 02/13/24 1103     Visit Number 5    Number of Visits 24    Date for PT Re-Evaluation 04/23/24    Progress Note Due on Visit 10    PT Start Time 1103    PT Stop Time 1145    PT Time Calculation (min) 42 min    Equipment Utilized During Treatment Gait belt    Activity Tolerance Patient tolerated treatment well    Behavior During Therapy WFL for tasks assessed/performed               Past Medical History:  Diagnosis Date   Arthritis    Cancer (HCC)    prostate cancer    Cardiomyopathy (HCC)    Chronic kidney disease, stage 3 (HCC)    Coronary artery disease    ED (erectile dysfunction)    GERD (gastroesophageal reflux disease)    Headache    hx of migraine-2015    Hyperlipidemia    Hypertension    Hypothyroidism    LBBB (left bundle branch block)    Migraine    Nocturia    Overactive bladder    Pneumonia    hx of at age 41    Past Surgical History:  Procedure Laterality Date   CARPAL TUNNEL RELEASE Bilateral    CORONARY ARTERY BYPASS GRAFT N/A 01/04/2024   Procedure: CORONARY ARTERY BYPASS GRAFTING TIMES THREE USING LEFT INTERNAL MAMMARY ARTERY AND RIGHT GREAT SAPHENOUS VEIN HARVESTED ENDOSCOPICALLY;  Surgeon: Tyler Lovely, MD;  Location: [facility] OR;  Service: Open Heart Surgery;  Laterality: N/A;   INTRAOPERATIVE TRANSESOPHAGEAL ECHOCARDIOGRAM N/A 01/04/2024   Procedure: ECHOCARDIOGRAM, TRANSESOPHAGEAL, INTRAOPERATIVE;  Surgeon: Tyler Lovely, MD;  Location: [facility] OR;  Service: Open Heart Surgery;  Laterality: N/A;   PROSTATE SURGERY     RIGHT/LEFT HEART CATH AND CORONARY ANGIOGRAPHY Bilateral 12/15/2023   Procedure: RIGHT/LEFT HEART CATH AND CORONARY ANGIOGRAPHY;  Surgeon: Tyler Crisp, MD;  Location: [facility]IVE [facility];  Service: Cardiovascular;  Laterality: Bilateral;   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   Procedure: LEFT TOTAL HIP ARTHROPLASTY ANTERIOR APPROACH;  Surgeon: Tyler Crew, MD;  Location: [facility];  Service: Orthopedics;  Laterality: Left;   Patient Active Problem List   Diagnosis Date Noted   Cardioembolic stroke (HCC) 01/12/2024   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   Angina pectoris (HCC) 12/15/2023   HFrEF (heart failure with reduced ejection fraction) (HCC) 12/15/2023   Palpitations 12/16/2019   PVC's (premature ventricular contractions) 12/16/2019   Left bundle branch block 12/16/2019   Essential hypertension 12/16/2019   Osteoarthritis of knee 06/07/2018   S/P left THA, 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    ONSET DATE: 01/12/24  REFERRING DIAG: I63.9 (ICD-10-CM) - Cardioembolic stroke (HCC)   THERAPY DIAG:  Abnormality of gait and mobility  Difficulty in walking, not elsewhere classified  Muscle weakness (generalized)  Other abnormalities of gait and mobility  Unsteadiness on feet  Cardioembolic stroke (HCC)  Rationale for Evaluation and Treatment: Rehabilitation  SUBJECTIVE:  SUBJECTIVE STATEMENT:  Patient reports using cane some and went back to church for 1st time in while. States some walking without cane.   Reports planning on going to beach on May 14th, needing to navigate the sand. They have a camper and will need to navigate the steps to enter with rail only on L side to enter.  Pt reports he has noticed when he wakes up in the morning having bilateral hip pain, asking if it could be related to medications - educated pt to discuss this with his MD.  Follow-up appts with MD: May 5th with cardiac surgeon and May 27th with cardiology PA   From Initial  Eval: [name] Pt finds standing regularly a challenge, has difficulty with bladder control, especially now that he is on lasix .   Pt accompanied by: significant other Tyler David   PERTINENT HISTORY: Tyler David is a 78 y.o. 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   PAIN:  Are you having pain? No  PRECAUTIONS: Fall, wearing Zio patch heart monitor  RED FLAGS: None   WEIGHT BEARING RESTRICTIONS: No  FALLS: Has patient fallen in last 6 months? No  LIVING ENVIRONMENT: Lives with: lives with their family and lives with their spouse Lives in: House/apartment Stairs:  threshold step  Has following equipment at home: Single point cane and Walker - 2 wheeled  PLOF: Independent  PATIENT GOALS: Get energy, strength, and balance back to pre morbid function   OBJECTIVE:  Note: Objective measures were completed at Evaluation unless otherwise noted.  DIAGNOSTIC FINDINGS: MRI findings 3/22: IMPRESSION: 1. Multiple foci of acute/subacute nonhemorrhagic infarction is present throughout both cerebral hemispheres and left greater than right cerebellum. 2. Acute/subacute infarcts in the left ACA distribution. 3. Additional posteromedial parietal lobe infarcts bilaterally, left greater than right. 4. Diffuse punctate foci of acute nonhemorrhagic infarction across the ACA/MCA watershed territories bilaterally. 5. Bilateral cerebellar infarcts are more prominent on the left. 6. The constellation of findings suggest a central embolic source. 7. Mild periventricular T2 hyperintensities bilaterally likely reflect the sequela of chronic microvascular ischemia.     COGNITION: Overall cognitive status: Impaired and Pt reports his memory and sharpness are lessened since the stroke. Patient's wife/caregiver has to correct on a few occasions with some subjective information.    LOWER EXTREMITY ROM:     Within normal limits for tasks assessed  LOWER EXTREMITY MMT:    MMT Right Eval Left Eval  Hip flexion 4 4+  Hip extension    Hip abduction 4 4+  Hip adduction 4 4+  Hip internal rotation    Hip external rotation    Knee flexion 4 4+  Knee extension 4 4+  Ankle dorsiflexion 4 4+  Ankle plantarflexion    Ankle inversion    Ankle eversion    (Blank rows = not tested)  BED MOBILITY:  Not tested  TRANSFERS: Sit to stand: Complete Independence  Assistive device utilized: Single point cane     Stand to sit: Complete Independence  Assistive device utilized: None     Chair to chair: Modified independence  Assistive device utilized: None       RAMP:  Not tested  CURB:  Not tested  STAIRS: Assess  in future visit  GAIT: Has slight Trendelenburg gait and slow gait speed with decreased stance time on his right lower extremity.  This is more exacerbated with use of cane and even more exacerbated with without assistive device with comparison to utilizing walker.  FUNCTIONAL TESTS:  5 times sit to stand: 23 sec Timed up and go (TUG): 20.55 sec 10 meter walk test: 14.35 sec (.69 m/s) 2WW, 17.43 sec ( .56 m/s) SPC Berg Balance Scale: 43 6 Minute Walk Test:    PATIENT SURVEYS:  Stroke Impact Scale 16 46/80                                                                                                                              TREATMENT DATE: 02/13/24   Monitored vitals throughout session given pt's extensive cardiac history  Vitals assessed, in sitting, throughout session: BP after seated therex: LUE- 143/64 mmHg BP after standing activities and walking= 157/69 mmHg  Therapeutic Exercise: therapeutic exercises to develop   strength and endurance, range of motion and flexibility   Seated Seated hip march 5 # Seated LAQ 5#  2x 12 reps each.  Therapeutic Activities: dynamic therapeutic activities  designed to achieve improved functional performance    step tap onto 1st step with BUE Support  5# x 12 reps; 2nd step onto 2nd step x 10 reps    Gait training ~165ft, no AD, CGA using 5# AW and use of gait belt with Verbal cues for more reciprocal stepping pattern with adequate gait speed exhibiting no LOB or significant unsteadiness today- only fatigue noted. Then ambulated ~180 feet on 2nd trial.   Standing calf raises from edge of step- 5# AW  12 reps  -Seated ham curl with BTB 12 reps ea LE.  -Seated dynamic side step up/over half foam without UE support x 15 each direction.       Provided seated rest breaks throughout session during vital sign assessment.        PATIENT EDUCATION: Education details: POC Person educated: Patient Education method: Explanation Education comprehension: verbalized understanding   HOME EXERCISE PROGRAM:  Access Code: [email or web address] URL: [email or web address] Date: 02/06/2024 Prepared by: Carlen Chasten  Exercises - Sit to Stand Without Arm Support  - 1 x daily - 7 x weekly - 3 sets - 10 reps - Standing March with Counter Support  - 1 x daily - 7 x weekly - 3 sets - 20 reps - Heel Raises with Counter Support  - 1 x daily - 7 x weekly - 3 sets - 10 reps - Standing Hamstring Curl with RW  - 1 x daily - 7 x weekly - 3 sets - 10 reps - Backward Walking with Counter Support  - 1 x daily - 7 x weekly - 3 sets - 10 reps   GOALS: Goals reviewed with patient? Yes  SHORT TERM GOALS: Target date: 02/27/2024       Patient will be independent  in home exercise program to improve strength/mobility for better functional independence with ADLs. Baseline: No HEP currently  Goal status: INITIAL   LONG TERM GOALS: Target date: 04/23/2024    1.  Patient (>  57 years old) will complete five times sit to stand test in < 15 seconds indicating an increased LE strength and improved balance. Baseline: 23 sec Goal status: INITIAL  2.  Patient will improve SIS-16 score to 60   to demonstrate statistically significant improvement in mobility and quality of life as it relates to their mobility and QOL as a result of their CVA.  Baseline: 46 Goal status: INITIAL   3.  Patient will increase Berg Balance score by > 6 points to demonstrate decreased fall risk during functional activities. Baseline: 43 Goal status: INITIAL   4.   Patient will reduce timed up and go to <11 seconds  to reduce fall risk and demonstrate improved transfer/gait ability. Baseline: 20.55 sec no AD Goal status: INITIAL  5.   Patient will increase 10 meter walk test to >1.40m/s as to improve gait speed for better community ambulation and to reduce fall risk. Baseline: 14.35 sec (.69 m/s) 2WW, 17.43 sec ( .56 m/s) SPC Goal status: INITIAL  6.   Patient will increase six minute walk test distance to >1000 for progression to community ambulator and improve gait ability Baseline: 02/01/2024: 812ft unsure of AD usage Goal status: INITIAL    ASSESSMENT:  CLINICAL IMPRESSION:  Patient is a 78 year old male who presents to physical therapy following a cerebrovascular accident as well as a procedure for heart revascularization that happened 2 days prior to his CVA. Patient able to improve overall- able to accept resistive activities and even resistive gait. He was again most limited by fatigue yet progressing well either with endurance or resistance to date.  Mr. Boshears will benefit from continued skilled physical therapy to address the above impairments, improve patient's mobility, improve patient's balance and safety, improve patient's quality of life to that of his premorbid function.  OBJECTIVE IMPAIRMENTS: Abnormal gait, decreased activity tolerance, decreased balance, decreased  endurance, decreased mobility, difficulty walking, decreased strength, and decreased safety awareness.   ACTIVITY LIMITATIONS: carrying, standing, squatting, and stairs  PARTICIPATION LIMITATIONS: meal prep, driving, community activity, and yard work  PERSONAL FACTORS: Age and 3+ comorbidities: hypertension migraine headaches hyperlipidemia, CKD stage III, prostate cancer, left total hip arthroplasty 09/29/2015, CAD/cardiomyopathy left bundle branch block  are also affecting patient's functional outcome.   REHAB POTENTIAL: Good  CLINICAL DECISION MAKING: Evolving/moderate complexity  EVALUATION COMPLEXITY: Moderate  PLAN:  PT FREQUENCY: 2x/week  PT DURATION: 12 weeks  PLANNED INTERVENTIONS: 97110-Therapeutic exercises, 97530- Therapeutic activity, 97112- Neuromuscular re-education, 97535- Self Care, 65784- Manual therapy, [phone]- Gait training, Patient/Family education, Balance training, and Stair training  PLAN FOR NEXT SESSION:  - higher level dynamic gait and balance challenges  - gait training on unstable surfaces to prepare for walking on sand at beach  - stair navigation training using only L HR support to prepare for camper entrance  - [facility] training to determine when safe to use that out in community  - Unstable surfaces with cane and foot clearance - Proximal R hip strengthening    Ossie Blend, PT Physical Therapist - [facility]  11:53 AM 02/13/24

## 2024-02-15 ENCOUNTER — Ambulatory Visit: Attending: Physician Assistant

## 2024-02-15 DIAGNOSIS — I639 Cerebral infarction, unspecified: Secondary | ICD-10-CM | POA: Diagnosis not present

## 2024-02-15 DIAGNOSIS — R269 Unspecified abnormalities of gait and mobility: Secondary | ICD-10-CM | POA: Diagnosis not present

## 2024-02-15 DIAGNOSIS — M6281 Muscle weakness (generalized): Secondary | ICD-10-CM | POA: Diagnosis not present

## 2024-02-15 DIAGNOSIS — R2681 Unsteadiness on feet: Secondary | ICD-10-CM | POA: Diagnosis not present

## 2024-02-15 DIAGNOSIS — R2689 Other abnormalities of gait and mobility: Secondary | ICD-10-CM | POA: Insufficient documentation

## 2024-02-15 DIAGNOSIS — R262 Difficulty in walking, not elsewhere classified: Secondary | ICD-10-CM | POA: Insufficient documentation

## 2024-02-15 NOTE — Progress Notes (Unsigned)
 HPI: Mr. Tyler David. Squire Dyers is a 78 year old gentleman with a past history of hypertension, dyslipidemia, systolic heart failure with ejection fraction of 35%, who was referred to us  earlier this year for management of multivessel coronary artery disease.  He initially presented to the cardiologist with complaints of angina and was found to have a new left bundle branch block.  He went on to have full cardiac workup demonstrating severe multivessel coronary disease.  He underwent CABG x 3 on 01/04/2024.  Postoperative course was complicated by left frontal cardioembolic CVA 8 manifested by right lower extremity weakness.  He was eventually discharged to the inpatient rehab center on postop day 8.  He is right lower extremity weakness gradually improved.  Since discharge home, he is continue to have outpatient physical therapy and has seen ongoing improvement in his right lower extremity weakness.  He has been seen in follow-up by cardiology and also had a virtual telephone visit with Dr. Deloise Ferries since his discharge home.  He presents today for scheduled follow-up with chest x-ray.   Current Outpatient Medications  Medication Sig Dispense Refill   amiodarone  (PACERONE ) 200 MG tablet Take 1 tablet (200 mg total) by mouth daily. 90 tablet 0   amLODipine  (NORVASC ) 5 MG tablet Take 1 tablet (5 mg total) by mouth daily. 90 tablet 3   apixaban  (ELIQUIS ) 5 MG TABS tablet Take 1 tablet (5 mg total) by mouth 2 (two) times daily. 60 tablet 0   aspirin  EC 81 MG tablet Take 1 tablet (81 mg total) by mouth daily. Swallow whole.     bisacodyl  (DULCOLAX) 5 MG EC tablet Take 2 tablets (10 mg total) by mouth daily.     cholecalciferol  (VITAMIN D3) 25 MCG (1000 UNIT) tablet Take 1 tablet (1,000 Units total) by mouth daily. 30 tablet 0   diclofenac  Sodium (VOLTAREN ) 1 % GEL Apply 2 g topically 4 (four) times daily. 100 g 0   docusate sodium  (COLACE) 100 MG capsule Take 2 capsules (200 mg total) by mouth daily.      ezetimibe  (ZETIA ) 10 MG tablet Take 1 tablet (10 mg total) by mouth daily. 30 tablet 0   famotidine  (PEPCID ) 20 MG tablet Take 1 tablet (20 mg total) by mouth every evening. 30 tablet 0   furosemide  (LASIX ) 40 MG tablet Take 1 tablet (40 mg total) by mouth daily as needed (for increased shortness of breath, lower extremity swelling, or weight gain greater than 3 pounds overnight or 5 pounds in a 7-day time span). 30 tablet 6   levothyroxine  (SYNTHROID ) 50 MCG tablet Take 1 tablet (50 mcg total) by mouth daily before breakfast. 30 tablet 0   loratadine  (CLARITIN ) 10 MG tablet Take 1 tablet (10 mg total) by mouth daily. 30 tablet 0   MAGNESIUM  PO Take 1 tablet by mouth daily.     melatonin 5 MG TABS Take 1 tablet (5 mg total) by mouth at bedtime. 30 tablet 0   metoprolol  tartrate (LOPRESSOR ) 25 MG tablet Take 0.5 tablets (12.5 mg total) by mouth 2 (two) times daily. 90 tablet 0   Multiple Vitamins-Minerals (ICAPS AREDS 2 PO) Take 1 capsule by mouth 2 (two) times daily.     nitroGLYCERIN  (NITROSTAT ) 0.4 MG SL tablet Place 1 tablet (0.4 mg total) under the tongue every 5 (five) minutes as needed for chest pain. 25 tablet 0   Omega-3 Fatty Acids (FISH OIL) 1000 MG CAPS Take 1,000 mg by mouth daily.     Turmeric 500 MG CAPS  Take 500 mg by mouth 2 (two) times daily.     No current facility-administered medications for this visit.    Physical Exam: ***  Diagnostic Tests: ***  Impression: ***  Plan: ***   Leata Providence, PA-C [facility] [phone]

## 2024-02-15 NOTE — Therapy (Signed)
 OUTPATIENT PHYSICAL THERAPY NEURO TREATMENT   Patient Name: Tyler David MRN: [id number] DOB:[birth year], 78 y.o., male Today's Date: 02/15/2024   PCP:   Roselind Congo, MD   REFERRING PROVIDER:    Sterling Eisenmenger, PA-C    END OF SESSION:   PT End of Session - 02/15/24 1106     Visit Number 6    Number of Visits 24    Date for PT Re-Evaluation 04/23/24    Progress Note Due on Visit 10    PT Start Time 1103    PT Stop Time 1144    PT Time Calculation (min) 41 min    Equipment Utilized During Treatment Gait belt    Activity Tolerance Patient tolerated treatment well    Behavior During Therapy WFL for tasks assessed/performed                Past Medical History:  Diagnosis Date   Arthritis    Cancer (HCC)    prostate cancer    Cardiomyopathy (HCC)    Chronic kidney disease, stage 3 (HCC)    Coronary artery disease    ED (erectile dysfunction)    GERD (gastroesophageal reflux disease)    Headache    hx of migraine-2015    Hyperlipidemia    Hypertension    Hypothyroidism    LBBB (left bundle branch block)    Migraine    Nocturia    Overactive bladder    Pneumonia    hx of at age 37    Past Surgical History:  Procedure Laterality Date   CARPAL TUNNEL RELEASE Bilateral    CORONARY ARTERY BYPASS GRAFT N/A 01/04/2024   Procedure: CORONARY ARTERY BYPASS GRAFTING TIMES THREE USING LEFT INTERNAL MAMMARY ARTERY AND RIGHT GREAT SAPHENOUS VEIN HARVESTED ENDOSCOPICALLY;  Surgeon: Hilarie Lovely, MD;  Location: [facility] OR;  Service: Open Heart Surgery;  Laterality: N/A;   INTRAOPERATIVE TRANSESOPHAGEAL ECHOCARDIOGRAM N/A 01/04/2024   Procedure: ECHOCARDIOGRAM, TRANSESOPHAGEAL, INTRAOPERATIVE;  Surgeon: Hilarie Lovely, MD;  Location: [facility] OR;  Service: Open Heart Surgery;  Laterality: N/A;   PROSTATE SURGERY     RIGHT/LEFT HEART CATH AND CORONARY ANGIOGRAPHY Bilateral 12/15/2023   Procedure: RIGHT/LEFT HEART CATH AND CORONARY ANGIOGRAPHY;  Surgeon: Sammy Crisp, MD;  Location: [facility] INVASIVE CV [facility];  Service: Cardiovascular;  Laterality: Bilateral;   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   Procedure: LEFT TOTAL HIP ARTHROPLASTY ANTERIOR APPROACH;  Surgeon: Claiborne Crew, MD;  Location: [facility];  Service: Orthopedics;  Laterality: Left;   Patient Active Problem List   Diagnosis Date Noted   Cardioembolic stroke (HCC) 01/12/2024   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   Angina pectoris (HCC) 12/15/2023   HFrEF (heart failure with reduced ejection fraction) (HCC) 12/15/2023   Palpitations 12/16/2019   PVC's (premature ventricular contractions) 12/16/2019   Left bundle branch block 12/16/2019   Essential hypertension 12/16/2019   Osteoarthritis of knee 06/07/2018   S/P left THA, 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    ONSET DATE: 01/12/24  REFERRING DIAG: I63.9 (ICD-10-CM) - Cardioembolic stroke (HCC)   THERAPY DIAG:  Abnormality of gait and mobility  Difficulty in walking, not elsewhere classified  Muscle weakness (generalized)  Other abnormalities of gait and mobility  Unsteadiness on feet  Rationale for Evaluation and Treatment: Rehabilitation  SUBJECTIVE:  SUBJECTIVE STATEMENT:  Patient reports doing okay with any new major issues or concerns.  Reports planning on going to beach on May 14th, needing to navigate the sand. They have a camper and will need to navigate the steps to enter with rail only on L side to enter.  Pt reports he has noticed when he wakes up in the morning having bilateral hip pain, asking if it could be related to medications - educated pt to discuss this with his MD.  Follow-up appts with MD: May 5th with cardiac surgeon and May 27th with cardiology PA   From Initial Eval: Pt had CVA as well as cardiomyopathy and left bundle block 2 days after  revascularization procedure pt had a series of strokes resulting in R LE weakness and L UE weakness. Pt finds standing regularly a challenge, has difficulty with bladder control, especially now that he is on lasix .   Pt accompanied by: significant other [name]   PERTINENT HISTORY: Tyler David is a 78 y.o. 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   PAIN:  Are you having pain? No  PRECAUTIONS: Fall, wearing Zio patch heart monitor  RED FLAGS: None   WEIGHT BEARING RESTRICTIONS: No  FALLS: Has patient fallen in last 6 months? No  LIVING ENVIRONMENT: Lives with: lives with their family and lives with their spouse Lives in: House/apartment Stairs:  threshold step  Has following equipment at home: Single point cane and Walker - 2 wheeled  PLOF: Independent  PATIENT GOALS: Get energy, strength, and balance back to pre morbid function   OBJECTIVE:  Note: Objective measures were completed at Evaluation unless otherwise noted.  DIAGNOSTIC FINDINGS: MRI findings 3/22: IMPRESSION: 1. Multiple foci of acute/subacute nonhemorrhagic infarction is present throughout both cerebral hemispheres and left greater than right cerebellum. 2. Acute/subacute infarcts in the left ACA distribution. 3. Additional posteromedial parietal lobe infarcts bilaterally, left greater than right. 4. Diffuse punctate foci of acute nonhemorrhagic infarction across the ACA/MCA watershed territories bilaterally. 5. Bilateral cerebellar infarcts are more prominent on the left. 6. The constellation of findings suggest a central embolic source. 7. Mild periventricular T2 hyperintensities bilaterally likely reflect the sequela of chronic microvascular ischemia.    COGNITION: Overall cognitive status: Impaired and Pt reports his memory and  sharpness are lessened since the stroke. Patient's wife/caregiver has to correct on a few occasions with some subjective information.    LOWER EXTREMITY ROM:     Within normal limits for tasks assessed  LOWER EXTREMITY MMT:    MMT Right Eval Left Eval  Hip flexion 4 4+  Hip extension    Hip abduction 4 4+  Hip adduction 4 4+  Hip internal rotation    Hip external rotation    Knee flexion 4 4+  Knee extension 4 4+  Ankle dorsiflexion 4 4+  Ankle plantarflexion    Ankle inversion    Ankle eversion    (Blank rows = not tested)  BED MOBILITY:  Not tested  TRANSFERS: Sit to stand: Complete Independence  Assistive device utilized: Single point cane     Stand to sit: Complete Independence  Assistive device utilized: None     Chair to chair: Modified independence  Assistive device utilized: None       RAMP:  Not tested  CURB:  Not tested  STAIRS: Assess in future visit  GAIT: Has slight Trendelenburg gait and  slow gait speed with decreased stance time on his right lower extremity.  This is more exacerbated with use of cane and even more exacerbated with without assistive device with comparison to utilizing walker.  FUNCTIONAL TESTS:  5 times sit to stand: 23 sec Timed up and go (TUG): 20.55 sec 10 meter walk test: 14.35 sec (.69 m/s) 2WW, 17.43 sec ( .56 m/s) SPC Berg Balance Scale: 43 6 Minute Walk Test:    PATIENT SURVEYS:  Stroke Impact Scale 16 46/80                                                                                                                              TREATMENT DATE: 02/15/24    NMR:   Agility ladder drill- Forward stepping (1 foot per square) - down and back - length of ladder x 8 reps.  Side step in agility ladder   Forward step over obstacles in // bars: 2 (1/2 foam rolls and 2 orange hurdles) down and back x 5  Unlevel walking on red mat (with  spike balls) - forward/backward/side step x multiple rounds  Therapeutic  Activities: dynamic therapeutic activities  designed to achieve improved functional performance   Sit to stand x 10 reps  without UE SUpport Walking lunge squat in // bars down and back x 2 Walking on heels- down and back x 3  Side step +squat down and back x 1       Provided seated rest breaks throughout session during vital sign assessment.        PATIENT EDUCATION: Education details: POC Person educated: Patient Education method: Explanation Education comprehension: verbalized understanding   HOME EXERCISE PROGRAM:  Access Code: [email or web address] URL: [email or web address] Date: 02/06/2024 Prepared by: Carlen Chasten  Exercises - Sit to Stand Without Arm Support  - 1 x daily - 7 x weekly - 3 sets - 10 reps - Standing March with Counter Support  - 1 x daily - 7 x weekly - 3 sets - 20 reps - Heel Raises with Counter Support  - 1 x daily - 7 x weekly - 3 sets - 10 reps - Standing Hamstring Curl with RW  - 1 x daily - 7 x weekly - 3 sets - 10 reps - Backward Walking with Counter Support  - 1 x daily - 7 x weekly - 3 sets - 10 reps   GOALS: Goals reviewed with patient? Yes  SHORT TERM GOALS: Target date: 02/27/2024       Patient will be independent in home exercise program to improve strength/mobility for better functional independence with ADLs. Baseline: No HEP currently  Goal status: INITIAL   LONG TERM GOALS: Target date: 04/23/2024    1.  Patient (> 58 years old) will complete five times sit to stand test in < 15 seconds indicating an increased LE strength and improved balance. Baseline: 23 sec Goal status: INITIAL  2.  Patient will improve SIS-16 score to  60   to demonstrate statistically significant improvement in mobility and quality of life as it relates to their mobility and QOL as a result of their CVA.  Baseline: 46 Goal status: INITIAL   3.  Patient will increase Berg Balance score by > 6 points to demonstrate decreased fall risk during  functional activities. Baseline: 43 Goal status: INITIAL   4.   Patient will reduce timed up and go to <11 seconds  to reduce fall risk and demonstrate improved transfer/gait ability. Baseline: 20.55 sec no AD Goal status: INITIAL  5.   Patient will increase 10 meter walk test to >1.2m/s as to improve gait speed for better community ambulation and to reduce fall risk. Baseline: 14.35 sec (.69 m/s) 2WW, 17.43 sec ( .56 m/s) SPC Goal status: INITIAL  6.   Patient will increase six minute walk test distance to >1000 for progression to community ambulator and improve gait ability Baseline: 02/01/2024: 846ft unsure of AD usage Goal status: INITIAL    ASSESSMENT:  CLINICAL IMPRESSION:  Patient is a 78 year old male who presents to physical therapy following a cerebrovascular accident as well as a procedure for heart revascularization that happened 2 days prior to his CVA. Treatment continues to focus on improving overall LE functional strength, mobility and balance. He was challenged with balance interventions including walking over unlevel mat and stepping over objects. He did make some in session progress- adapting to tasks but still unsteady at times.   Mr. Gane will benefit from continued skilled physical therapy to address the above impairments, improve patient's mobility, improve patient's balance and safety, improve patient's quality of life to that of his premorbid function.  OBJECTIVE IMPAIRMENTS: Abnormal gait, decreased activity tolerance, decreased balance, decreased endurance, decreased mobility, difficulty walking, decreased strength, and decreased safety awareness.   ACTIVITY LIMITATIONS: carrying, standing, squatting, and stairs  PARTICIPATION LIMITATIONS: meal prep, driving, community activity, and yard work  PERSONAL FACTORS: Age and 3+ comorbidities: hypertension migraine headaches hyperlipidemia, CKD stage III, prostate cancer, left total hip arthroplasty 09/29/2015,  CAD/cardiomyopathy left bundle branch block  are also affecting patient's functional outcome.   REHAB POTENTIAL: Good  CLINICAL DECISION MAKING: Evolving/moderate complexity  EVALUATION COMPLEXITY: Moderate  PLAN:  PT FREQUENCY: 2x/week  PT DURATION: 12 weeks  PLANNED INTERVENTIONS: 97110-Therapeutic exercises, 97530- Therapeutic activity, 97112- Neuromuscular re-education, 97535- Self Care, 16109- Manual therapy, [phone]- Gait training, Patient/Family education, Balance training, and Stair training  PLAN FOR NEXT SESSION:  - higher level dynamic gait and balance challenges  - gait training on unstable surfaces to prepare for walking on sand at beach  - stair navigation training using only L HR support to prepare for camper entrance  - [facility] training to determine when safe to use that out in community  - Unstable surfaces with cane and foot clearance - Proximal R hip strengthening    Ossie Blend, PT Physical Therapist - [facility]  5:14 PM 02/15/24

## 2024-02-16 ENCOUNTER — Other Ambulatory Visit: Payer: Self-pay | Admitting: Thoracic Surgery (Cardiothoracic Vascular Surgery)

## 2024-02-16 DIAGNOSIS — I2510 Atherosclerotic heart disease of native coronary artery without angina pectoris: Secondary | ICD-10-CM

## 2024-02-19 ENCOUNTER — Ambulatory Visit: Payer: Self-pay | Attending: Thoracic Surgery (Cardiothoracic Vascular Surgery) | Admitting: Physician Assistant

## 2024-02-19 ENCOUNTER — Ambulatory Visit (HOSPITAL_COMMUNITY)
Admission: RE | Admit: 2024-02-19 | Discharge: 2024-02-19 | Disposition: A | Discharge status: Home / Self Care | Source: Ambulatory Visit | Attending: Cardiovascular Disease | Admitting: Cardiovascular Disease

## 2024-02-19 ENCOUNTER — Encounter: Payer: Self-pay | Admitting: Physician Assistant

## 2024-02-19 VITALS — BP 148/82 | HR 71 | Resp 18 | Ht 68.0 in | Wt 192.0 lb

## 2024-02-19 DIAGNOSIS — Z951 Presence of aortocoronary bypass graft: Secondary | ICD-10-CM

## 2024-02-19 DIAGNOSIS — I2510 Atherosclerotic heart disease of native coronary artery without angina pectoris: Secondary | ICD-10-CM | POA: Diagnosis not present

## 2024-02-19 NOTE — Patient Instructions (Signed)
 Continue to observe sternal precautions.  He may increase the weight limitation to 15 pounds at this point.  After another 6 weeks, he may gradually increase activity without restriction.  He may resume driving from CT surgery standpoint but you are advised to get clearance from the neurologist before you began.  No change in medications from CT surgery standpoint.  Follow-up with CT surgery as needed.

## 2024-02-20 ENCOUNTER — Ambulatory Visit

## 2024-02-20 ENCOUNTER — Other Ambulatory Visit: Payer: Self-pay | Admitting: *Deleted

## 2024-02-20 DIAGNOSIS — R269 Unspecified abnormalities of gait and mobility: Secondary | ICD-10-CM | POA: Diagnosis not present

## 2024-02-20 DIAGNOSIS — R2689 Other abnormalities of gait and mobility: Secondary | ICD-10-CM

## 2024-02-20 DIAGNOSIS — R2681 Unsteadiness on feet: Secondary | ICD-10-CM

## 2024-02-20 DIAGNOSIS — M6281 Muscle weakness (generalized): Secondary | ICD-10-CM

## 2024-02-20 DIAGNOSIS — R262 Difficulty in walking, not elsewhere classified: Secondary | ICD-10-CM

## 2024-02-20 NOTE — Progress Notes (Signed)
 Per Pat Bonier, PA, referral to [facility] cardiac rehab placed.

## 2024-02-20 NOTE — Therapy (Signed)
 OUTPATIENT PHYSICAL THERAPY NEURO TREATMENT   Patient Name: Tyler David MRN: [id number] DOB:Jul 28, 1946, 78 y.o., male Today's Date: 02/20/2024   PCP:   Roselind Congo, MD   REFERRING PROVIDER:    Sterling Eisenmenger, PA-C    END OF SESSION:   PT End of Session - 02/20/24 1102     Visit Number 7    Number of Visits 24    Date for PT Re-Evaluation 04/23/24    Progress Note Due on Visit 10    PT Start Time 1103    PT Stop Time 1134    PT Time Calculation (min) 31 min    Equipment Utilized During Treatment Gait belt    Activity Tolerance Patient tolerated treatment well    Behavior During Therapy WFL for tasks assessed/performed                Past Medical History:  Diagnosis Date   Arthritis    Cancer (HCC)    prostate cancer    Cardiomyopathy (HCC)    Chronic kidney disease, stage 3 (HCC)    Coronary artery disease    ED (erectile dysfunction)    GERD (gastroesophageal reflux disease)    Headache    hx of migraine-2015    Hyperlipidemia    Hypertension    Hypothyroidism    LBBB (left bundle branch block)    Migraine    Nocturia    Overactive bladder    Pneumonia    hx of at age 59    Past Surgical History:  Procedure Laterality Date   CARPAL TUNNEL RELEASE Bilateral    CORONARY ARTERY BYPASS GRAFT N/A 01/04/2024   Procedure: CORONARY ARTERY BYPASS GRAFTING TIMES THREE USING LEFT INTERNAL MAMMARY ARTERY AND RIGHT GREAT SAPHENOUS VEIN HARVESTED ENDOSCOPICALLY;  Surgeon: Hilarie Lovely, MD;  Location: [facility];  Service: Open Heart Surgery;  Laterality: N/A;   INTRAOPERATIVE TRANSESOPHAGEAL ECHOCARDIOGRAM N/A 01/04/2024   Procedure: ECHOCARDIOGRAM, TRANSESOPHAGEAL, INTRAOPERATIVE;  Surgeon: Hilarie Lovely, MD;  Location: [facility];  Service: Open Heart Surgery;  Laterality: N/A;   PROSTATE SURGERY     RIGHT/LEFT HEART CATH AND CORONARY ANGIOGRAPHY Bilateral 12/15/2023   Procedure: RIGHT/LEFT HEART CATH AND CORONARY ANGIOGRAPHY;  Surgeon: Sammy Crisp, MD;  Location: [facility]IVE CV [facility];  Service: Cardiovascular;  Laterality: Bilateral;   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   Procedure: LEFT TOTAL HIP ARTHROPLASTY ANTERIOR APPROACH;  Surgeon: Claiborne Crew, MD;  Location: [facility];  Service: Orthopedics;  Laterality: Left;   Patient Active Problem List   Diagnosis Date Noted   Cardioembolic stroke (HCC) 01/12/2024   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   Angina pectoris (HCC) 12/15/2023   HFrEF (heart failure with reduced ejection fraction) (HCC) 12/15/2023   Palpitations 12/16/2019   PVC's (premature ventricular contractions) 12/16/2019   Left bundle branch block 12/16/2019   Essential hypertension 12/16/2019   Osteoarthritis of knee 06/07/2018   S/P left THA, 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    ONSET DATE: 01/12/24  REFERRING DIAG: I63.9 (ICD-10-CM) - Cardioembolic stroke (HCC)   THERAPY DIAG:  Abnormality of gait and mobility  Difficulty in walking, not elsewhere classified  Muscle weakness (generalized)  Other abnormalities of gait and mobility  Unsteadiness on feet  Rationale for Evaluation and Treatment: Rehabilitation  SUBJECTIVE:  SUBJECTIVE STATEMENT:  Patient reports doing okay- been attempting some light yardwork and wife reports he is walking better.   Reports planning on going to beach on May 14th, needing to navigate the sand.   NEXT MD VISIT: Patient reports Cardiologist released him after his visit yesterday on 02/19/2024. Next appt:  May 27th with cardiology PA   From Initial Eval: [name] Pt finds standing regularly a challenge, has difficulty with bladder control, especially  now that he is on lasix .   Pt accompanied by: significant other [name]   PERTINENT HISTORY: Tyler David is a 78 y.o. 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   PAIN:  Are you having pain? No  PRECAUTIONS: Fall, wearing Zio patch heart monitor  RED FLAGS: None   WEIGHT BEARING RESTRICTIONS: No  FALLS: Has patient fallen in last 6 months? No  LIVING ENVIRONMENT: Lives with: lives with their family and lives with their spouse Lives in: House/apartment Stairs:  threshold step  Has following equipment at home: Single point cane and Walker - 2 wheeled  PLOF: Independent  PATIENT GOALS: Get energy, strength, and balance back to pre morbid function   OBJECTIVE:  Note: Objective measures were completed at Evaluation unless otherwise noted.  DIAGNOSTIC FINDINGS: MRI findings 3/22: IMPRESSION: 1. Multiple foci of acute/subacute nonhemorrhagic infarction is present throughout both cerebral hemispheres and left greater than right cerebellum. 2. Acute/subacute infarcts in the left ACA distribution. 3. Additional posteromedial parietal lobe infarcts bilaterally, left greater than right. 4. Diffuse punctate foci of acute nonhemorrhagic infarction across the ACA/MCA watershed territories bilaterally. 5. Bilateral cerebellar infarcts are more prominent on the left. 6. The constellation of findings suggest a central embolic source. 7. Mild periventricular T2 hyperintensities bilaterally likely reflect the sequela of chronic microvascular ischemia.    COGNITION: Overall cognitive status: Impaired and Pt reports his memory and sharpness are lessened since the stroke. Patient's wife/caregiver has to correct on a few occasions with some subjective information.    LOWER EXTREMITY ROM:     Within normal limits for  tasks assessed  LOWER EXTREMITY MMT:    MMT Right Eval Left Eval  Hip flexion 4 4+  Hip extension    Hip abduction 4 4+  Hip adduction 4 4+  Hip internal rotation    Hip external rotation    Knee flexion 4 4+  Knee extension 4 4+  Ankle dorsiflexion 4 4+  Ankle plantarflexion    Ankle inversion    Ankle eversion    (Blank rows = not tested)  BED MOBILITY:  Not tested  TRANSFERS: Sit to stand: Complete Independence  Assistive device utilized: Single point cane     Stand to sit: Complete Independence  Assistive device utilized: None     Chair to chair: Modified independence  Assistive device utilized: None       RAMP:  Not tested  CURB:  Not tested  STAIRS: Assess in future visit  GAIT: Has slight Trendelenburg gait and slow gait speed with decreased stance time on his right lower extremity.  This is more exacerbated with use of cane and even more exacerbated with without assistive device with comparison to utilizing walker.  FUNCTIONAL TESTS:  5 times sit  to stand: 23 sec Timed up and go (TUG): 20.55 sec 10 meter walk test: 14.35 sec (.69 m/s) 2WW, 17.43 sec ( .56 m/s) SPC Berg Balance Scale: 43 6 Minute Walk Test:    PATIENT SURVEYS:  Stroke Impact Scale 16 46/80                                                                                                                              TREATMENT DATE: 02/20/24      Therapeutic Activities: dynamic therapeutic activities  designed to achieve improved functional performance   Patient ambulated indoors and outdoors today-  across stable and unstable surfaces. Negotiating changing surfaces from grass to sidewalk, pine needles, concrete, with turns and obstacles in pathway without LOB. He also performed incline ramp and several curbs without any UE support- all with SBA today- total time walking approx 30  min with a couple min rest break. Patient reported increased overall fatigue and treatment ended a few min  early.       Provided seated rest breaks throughout session during vital sign assessment.        PATIENT EDUCATION: Education details: POC Person educated: Patient Education method: Explanation Education comprehension: verbalized understanding   HOME EXERCISE PROGRAM:  Access Code: [email or web address] URL: [email or web address] Date: 02/06/2024 Prepared by: Carlen Chasten  Exercises - Sit to Stand Without Arm Support  - 1 x daily - 7 x weekly - 3 sets - 10 reps - Standing March with Counter Support  - 1 x daily - 7 x weekly - 3 sets - 20 reps - Heel Raises with Counter Support  - 1 x daily - 7 x weekly - 3 sets - 10 reps - Standing Hamstring Curl with RW  - 1 x daily - 7 x weekly - 3 sets - 10 reps - Backward Walking with Counter Support  - 1 x daily - 7 x weekly - 3 sets - 10 reps   GOALS: Goals reviewed with patient? Yes  SHORT TERM GOALS: Target date: 02/27/2024       Patient will be independent in home exercise program to improve strength/mobility for better functional independence with ADLs. Baseline: No HEP currently  Goal status: INITIAL   LONG TERM GOALS: Target date: 04/23/2024    1.  Patient (> 4 years old) will complete five times sit to stand test in < 15 seconds indicating an increased LE strength and improved balance. Baseline: 23 sec Goal status: INITIAL  2.  Patient will improve SIS-16 score to 60   to demonstrate statistically significant improvement in mobility and quality of life as it relates to their mobility and QOL as a result of their CVA.  Baseline: 46 Goal status: INITIAL   3.  Patient will increase Berg Balance score by > 6 points to demonstrate decreased fall risk during functional activities. Baseline: 43 Goal status: INITIAL   4.   Patient will reduce timed up and go to <  11 seconds  to reduce fall risk and demonstrate improved transfer/gait ability. Baseline: 20.55 sec no AD Goal status: INITIAL  5.   Patient will  increase 10 meter walk test to >1.32m/s as to improve gait speed for better community ambulation and to reduce fall risk. Baseline: 14.35 sec (.69 m/s) 2WW, 17.43 sec ( .56 m/s) SPC Goal status: INITIAL  6.   Patient will increase six minute walk test distance to >1000 for progression to community ambulator and improve gait ability Baseline: 02/01/2024: 812ft unsure of AD usage Goal status: INITIAL    ASSESSMENT:  CLINICAL IMPRESSION:  Patient is a 78 year old male who presents to physical therapy following a cerebrovascular accident as well as a procedure for heart revascularization that happened 2 days prior to his CVA. Treatment continued per plan - focusing on balance with walking outdoors and mostly working on functional endurance. He encountered many obstacles to negotiate outdoors and he performed well- only unstable with curbs. He was most limited by fatigue today and will benefit from continued focus as he prepares for future transition to cardiac rehab.   Mr. Preiser will benefit from continued skilled physical therapy to address the above impairments, improve patient's mobility, improve patient's balance and safety, improve patient's quality of life to that of his premorbid function.  OBJECTIVE IMPAIRMENTS: Abnormal gait, decreased activity tolerance, decreased balance, decreased endurance, decreased mobility, difficulty walking, decreased strength, and decreased safety awareness.   ACTIVITY LIMITATIONS: carrying, standing, squatting, and stairs  PARTICIPATION LIMITATIONS: meal prep, driving, community activity, and yard work  PERSONAL FACTORS: Age and 3+ comorbidities: hypertension migraine headaches hyperlipidemia, CKD stage III, prostate cancer, left total hip arthroplasty 09/29/2015, CAD/cardiomyopathy left bundle branch block  are also affecting patient's functional outcome.   REHAB POTENTIAL: Good  CLINICAL DECISION MAKING: Evolving/moderate complexity  EVALUATION  COMPLEXITY: Moderate  PLAN:  PT FREQUENCY: 2x/week  PT DURATION: 12 weeks  PLANNED INTERVENTIONS: 97110-Therapeutic exercises, 97530- Therapeutic activity, 97112- Neuromuscular re-education, 97535- Self Care, 47829- Manual therapy, [phone]- Gait training, Patient/Family education, Balance training, and Stair training  PLAN FOR NEXT SESSION:  - higher level dynamic gait and balance challenges  - stair navigation training using only L HR support to prepare for camper entrance   - Proximal R hip and overall LE strengthening    Ossie Blend, PT Physical Therapist - [facility]  12:32 PM 02/20/24

## 2024-02-21 ENCOUNTER — Ambulatory Visit: Payer: PPO | Admitting: Cardiology

## 2024-02-22 ENCOUNTER — Ambulatory Visit

## 2024-02-22 DIAGNOSIS — I639 Cerebral infarction, unspecified: Secondary | ICD-10-CM

## 2024-02-22 DIAGNOSIS — R269 Unspecified abnormalities of gait and mobility: Secondary | ICD-10-CM | POA: Diagnosis not present

## 2024-02-22 DIAGNOSIS — R2681 Unsteadiness on feet: Secondary | ICD-10-CM

## 2024-02-22 DIAGNOSIS — M6281 Muscle weakness (generalized): Secondary | ICD-10-CM

## 2024-02-22 DIAGNOSIS — R262 Difficulty in walking, not elsewhere classified: Secondary | ICD-10-CM

## 2024-02-22 DIAGNOSIS — R2689 Other abnormalities of gait and mobility: Secondary | ICD-10-CM

## 2024-02-22 NOTE — Therapy (Signed)
 OUTPATIENT PHYSICAL THERAPY NEURO TREATMENT   Patient Name: Tyler David MRN: [id number] DOB:June 23, 1946, 78 y.o., male Today's Date: 02/23/2024   PCP:   Tyler Congo, MD   REFERRING PROVIDER:    Sterling Eisenmenger, PA-C    END OF SESSION:   PT End of Session - 02/22/24 0948     Visit Number 8    Number of Visits 24    Date for PT Re-Evaluation 04/23/24    Progress Note Due on Visit 10    PT Start Time 1530    PT Stop Time 1615    PT Time Calculation (min) 45 min    Equipment Utilized During Treatment Gait belt    Activity Tolerance Patient tolerated treatment well    Behavior During Therapy WFL for tasks assessed/performed                 Past Medical History:  Diagnosis Date   Arthritis    Cancer (HCC)    prostate cancer    Cardiomyopathy (HCC)    Chronic kidney disease, stage 3 (HCC)    Coronary artery disease    ED (erectile dysfunction)    GERD (gastroesophageal reflux disease)    Headache    hx of migraine-2015    Hyperlipidemia    Hypertension    Hypothyroidism    LBBB (left bundle branch block)    Migraine    Nocturia    Overactive bladder    Pneumonia    hx of at age 27    Past Surgical History:  Procedure Laterality Date   CARPAL TUNNEL RELEASE Bilateral    CORONARY ARTERY BYPASS GRAFT N/A 01/04/2024   Procedure: CORONARY ARTERY BYPASS GRAFTING TIMES THREE USING LEFT INTERNAL MAMMARY ARTERY AND RIGHT GREAT SAPHENOUS VEIN HARVESTED ENDOSCOPICALLY;  Surgeon: Tyler Lovely, MD;  Location: [facility];  Service: Open Heart Surgery;  Laterality: N/A;   INTRAOPERATIVE TRANSESOPHAGEAL ECHOCARDIOGRAM N/A 01/04/2024   Procedure: ECHOCARDIOGRAM, TRANSESOPHAGEAL, INTRAOPERATIVE;  Surgeon: Tyler Lovely, MD;  Location: [facility];  Service: Open Heart Surgery;  Laterality: N/A;   PROSTATE SURGERY     RIGHT/LEFT HEART CATH AND CORONARY ANGIOGRAPHY Bilateral 12/15/2023   Procedure: RIGHT/LEFT HEART CATH AND CORONARY ANGIOGRAPHY;  Surgeon: Tyler Crisp, MD;  Location: [facility];  Service: Cardiovascular;  Laterality: Bilateral;   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   Procedure: LEFT TOTAL HIP ARTHROPLASTY ANTERIOR APPROACH;  Surgeon: Tyler Crew, MD;  Location: [facility];  Service: Orthopedics;  Laterality: Left;   Patient Active Problem List   Diagnosis Date Noted   Cardioembolic stroke (HCC) 01/12/2024   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   Angina pectoris (HCC) 12/15/2023   HFrEF (heart failure with reduced ejection fraction) (HCC) 12/15/2023   Palpitations 12/16/2019   PVC's (premature ventricular contractions) 12/16/2019   Left bundle branch block 12/16/2019   Essential hypertension 12/16/2019   Osteoarthritis of knee 06/07/2018   S/P left THA, 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    ONSET DATE: 01/12/24  REFERRING DIAG: I63.9 (ICD-10-CM) - Cardioembolic stroke (HCC)   THERAPY DIAG:  Abnormality of gait and mobility  Difficulty in walking, not elsewhere classified  Muscle weakness (generalized)  Other abnormalities of gait and mobility  Unsteadiness on feet  Cardioembolic stroke (HCC)  Rationale for Evaluation and Treatment: Rehabilitation  SUBJECTIVE:  SUBJECTIVE STATEMENT:  Patient reports continuing to build his strength.   Reports planning on going to beach on May 14th, needing to navigate the sand.   NEXT MD VISIT: Patient reports Cardiologist released him after his visit yesterday on 02/19/2024. Next appt:  May 27th with cardiology PA   From Initial Eval: [name] Pt finds standing regularly a challenge, has difficulty with bladder control, especially now that he is on  lasix .   Pt accompanied by: significant other Tyler David   PERTINENT HISTORY: Tyler David is a 78 y.o. 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   PAIN:  Are you having pain? No  PRECAUTIONS: Fall, wearing Zio patch heart monitor  RED FLAGS: None   WEIGHT BEARING RESTRICTIONS: No  FALLS: Has patient fallen in last 6 months? No  LIVING ENVIRONMENT: Lives with: lives with their family and lives with their spouse Lives in: House/apartment Stairs: threshold step  Has following equipment at home: Single point cane and Walker - 2 wheeled  PLOF: Independent  PATIENT GOALS: Get energy, strength, and balance back to pre morbid function   OBJECTIVE:  Note: Objective measures were completed at Evaluation unless otherwise noted.  DIAGNOSTIC FINDINGS: MRI findings 3/22: IMPRESSION: 1. Multiple foci of acute/subacute nonhemorrhagic infarction is present throughout both cerebral hemispheres and left greater than right cerebellum. 2. Acute/subacute infarcts in the left ACA distribution. 3. Additional posteromedial parietal lobe infarcts bilaterally, left greater than right. 4. Diffuse punctate foci of acute nonhemorrhagic infarction across the ACA/MCA watershed territories bilaterally. 5. Bilateral cerebellar infarcts are more prominent on the left. 6. The constellation of findings suggest a central embolic source. 7. Mild periventricular T2 hyperintensities bilaterally likely reflect the sequela of chronic microvascular ischemia.    COGNITION: Overall cognitive status: Impaired and Pt reports his memory and sharpness are lessened since the stroke. Patient's wife/caregiver has to correct on a few occasions with some subjective information.    LOWER EXTREMITY ROM:     Within normal limits for tasks  assessed  LOWER EXTREMITY MMT:    MMT Right Eval Left Eval  Hip flexion 4 4+  Hip extension    Hip abduction 4 4+  Hip adduction 4 4+  Hip internal rotation    Hip external rotation    Knee flexion 4 4+  Knee extension 4 4+  Ankle dorsiflexion 4 4+  Ankle plantarflexion    Ankle inversion    Ankle eversion    (Blank rows = not tested)  BED MOBILITY:  Not tested  TRANSFERS: Sit to stand: Complete Independence  Assistive device utilized: Single point cane     Stand to sit: Complete Independence  Assistive device utilized: None     Chair to chair: Modified independence  Assistive device utilized: None       RAMP:  Not tested  CURB:  Not tested  STAIRS: Assess in future visit  GAIT: Has slight Trendelenburg gait and slow gait speed with decreased stance time on his right lower extremity.  This is more exacerbated with use of cane and even more exacerbated with without assistive device with comparison to utilizing walker.  FUNCTIONAL TESTS:  5 times sit to stand: 23 sec Timed up and go (TUG): 20.55  sec 10 meter walk test: 14.35 sec (.69 m/s) 2WW, 17.43 sec ( .56 m/s) SPC Berg Balance Scale: 43 6 Minute Walk Test:    PATIENT SURVEYS:  Stroke Impact Scale 16 46/80                                                                                                                              TREATMENT DATE: 02/23/24       Therapeutic Activities: dynamic therapeutic activities  designed to achieve improved functional performance    Activity Description: step tap onto 4 floor placed pods and 2 overhead on dry erase Activity Setting:  Random Number of Pods:  6 Cycles/Sets:  10 Duration (Time or Hit Count):  60 sec  Patient Stats  Reaction Time:  3.5 m/s avg OR Hits:   ranged from initially 14 up to 22   2 min step test= 46 steps (counted every Right LE step) - Patient reported as very fatiguing.   Nustep L1-L4 - 6 min HR- 82 bpm - LE only - continuous  monitoring.   Sit to stand x 15 reps without UE suppport  Self care/home mangement:  -Discussed cardiac rehab and walked patient down to center- to observe facility and meet director.     Provided seated rest breaks throughout session during vital sign assessment.        PATIENT EDUCATION: Education details: POC Person educated: Patient Education method: Explanation Education comprehension: verbalized understanding   HOME EXERCISE PROGRAM:  Access Code: [email or web address] URL: [email or web address] Date: 02/06/2024 Prepared by: Carlen Chasten  Exercises - Sit to Stand Without Arm Support  - 1 x daily - 7 x weekly - 3 sets - 10 reps - Standing March with Counter Support  - 1 x daily - 7 x weekly - 3 sets - 20 reps - Heel Raises with Counter Support  - 1 x daily - 7 x weekly - 3 sets - 10 reps - Standing Hamstring Curl with RW  - 1 x daily - 7 x weekly - 3 sets - 10 reps - Backward Walking with Counter Support  - 1 x daily - 7 x weekly - 3 sets - 10 reps   GOALS: Goals reviewed with patient? Yes  SHORT TERM GOALS: Target date: 02/27/2024       Patient will be independent in home exercise program to improve strength/mobility for better functional independence with ADLs. Baseline: No HEP currently  Goal status: INITIAL   LONG TERM GOALS: Target date: 04/23/2024    1.  Patient (> 60 years old) will complete five times sit to stand test in < 15 seconds indicating an increased LE strength and improved balance. Baseline: 23 sec Goal status: INITIAL  2.  Patient will improve SIS-16 score to 60   to demonstrate statistically significant improvement in mobility and quality of life as it relates to their mobility and QOL as a result of their CVA.  Baseline: 46 Goal status:  INITIAL   3.  Patient will increase Berg Balance score by > 6 points to demonstrate decreased fall risk during functional activities. Baseline: 43 Goal status: INITIAL   4.   Patient will  reduce timed up and go to <11 seconds  to reduce fall risk and demonstrate improved transfer/gait ability. Baseline: 20.55 sec no AD Goal status: INITIAL  5.   Patient will increase 10 meter walk test to >1.23m/s as to improve gait speed for better community ambulation and to reduce fall risk. Baseline: 14.35 sec (.69 m/s) 2WW, 17.43 sec ( .56 m/s) SPC Goal status: INITIAL  6.   Patient will increase six minute walk test distance to >1000 for progression to community ambulator and improve gait ability Baseline: 02/01/2024: 875ft unsure of AD usage Goal status: INITIAL    ASSESSMENT:  CLINICAL IMPRESSION:  Patient is a 78 year old male who presents to physical therapy following a cerebrovascular accident as well as a procedure for heart revascularization that happened 2 days prior to his CVA. Treatment focused on improving overall strength and endurance. He performed well on Nustep and very fatigued with sit to stand and 2 min step test requiring brief rest breaks. He was instructed to only use LE for Nustep to avoid any chest discomfort.   Mr. Haniff will benefit from continued skilled physical therapy to address the above impairments, improve patient's mobility, improve patient's balance and safety, improve patient's quality of life to that of his premorbid function.  OBJECTIVE IMPAIRMENTS: Abnormal gait, decreased activity tolerance, decreased balance, decreased endurance, decreased mobility, difficulty walking, decreased strength, and decreased safety awareness.   ACTIVITY LIMITATIONS: carrying, standing, squatting, and stairs  PARTICIPATION LIMITATIONS: meal prep, driving, community activity, and yard work  PERSONAL FACTORS: Age and 3+ comorbidities: hypertension migraine headaches hyperlipidemia, CKD stage III, prostate cancer, left total hip arthroplasty 09/29/2015, CAD/cardiomyopathy left bundle branch block are also affecting patient's functional outcome.   REHAB POTENTIAL:  Good  CLINICAL DECISION MAKING: Evolving/moderate complexity  EVALUATION COMPLEXITY: Moderate  PLAN:  PT FREQUENCY: 2x/week  PT DURATION: 12 weeks  PLANNED INTERVENTIONS: 97110-Therapeutic exercises, 97530- Therapeutic activity, 97112- Neuromuscular re-education, 97535- Self Care, 16109- Manual therapy, [phone]- Gait training, Patient/Family education, Balance training, and Stair training  PLAN FOR NEXT SESSION:  - higher level dynamic gait and balance challenges  - stair navigation training using only L HR support to prepare for camper entrance   - Proximal R hip and overall LE strengthening    Ossie Blend, PT Physical Therapist - [facility]  [facility]  9:55 AM 02/23/24

## 2024-02-23 ENCOUNTER — Other Ambulatory Visit: Payer: Self-pay | Admitting: *Deleted

## 2024-02-24 DIAGNOSIS — I480 Paroxysmal atrial fibrillation: Secondary | ICD-10-CM | POA: Diagnosis not present

## 2024-02-26 DIAGNOSIS — H353132 Nonexudative age-related macular degeneration, bilateral, intermediate dry stage: Secondary | ICD-10-CM | POA: Diagnosis not present

## 2024-02-26 DIAGNOSIS — H0288B Meibomian gland dysfunction left eye, upper and lower eyelids: Secondary | ICD-10-CM | POA: Diagnosis not present

## 2024-02-26 DIAGNOSIS — H2513 Age-related nuclear cataract, bilateral: Secondary | ICD-10-CM | POA: Diagnosis not present

## 2024-02-26 DIAGNOSIS — H524 Presbyopia: Secondary | ICD-10-CM | POA: Diagnosis not present

## 2024-02-26 DIAGNOSIS — H52223 Regular astigmatism, bilateral: Secondary | ICD-10-CM | POA: Diagnosis not present

## 2024-02-26 DIAGNOSIS — H5203 Hypermetropia, bilateral: Secondary | ICD-10-CM | POA: Diagnosis not present

## 2024-02-27 ENCOUNTER — Ambulatory Visit: Admitting: Physical Therapy

## 2024-02-27 DIAGNOSIS — R2689 Other abnormalities of gait and mobility: Secondary | ICD-10-CM

## 2024-02-27 DIAGNOSIS — M6281 Muscle weakness (generalized): Secondary | ICD-10-CM

## 2024-02-27 DIAGNOSIS — R262 Difficulty in walking, not elsewhere classified: Secondary | ICD-10-CM

## 2024-02-27 DIAGNOSIS — R2681 Unsteadiness on feet: Secondary | ICD-10-CM

## 2024-02-27 DIAGNOSIS — R269 Unspecified abnormalities of gait and mobility: Secondary | ICD-10-CM

## 2024-02-27 NOTE — Therapy (Signed)
 OUTPATIENT PHYSICAL THERAPY NEURO TREATMENT   Patient Name: Tyler David MRN: [id number] DOB:Sep 15, 1946, 78 y.o., male Today's Date: 02/27/2024   PCP:   Roselind Congo, MD   REFERRING PROVIDER:    Sterling Eisenmenger, PA-C    END OF SESSION:   PT End of Session - 02/27/24 1100     Visit Number 9    Number of Visits 24    Date for PT Re-Evaluation 04/23/24    Progress Note Due on Visit 10    PT Start Time 1058    PT Stop Time 1138    PT Time Calculation (min) 40 min    Equipment Utilized During Treatment Gait belt    Activity Tolerance Patient tolerated treatment well    Behavior During Therapy WFL for tasks assessed/performed                  Past Medical History:  Diagnosis Date   Arthritis    Cancer (HCC)    prostate cancer    Cardiomyopathy (HCC)    Chronic kidney disease, stage 3 (HCC)    Coronary artery disease    ED (erectile dysfunction)    GERD (gastroesophageal reflux disease)    Headache    hx of migraine-2015    Hyperlipidemia    Hypertension    Hypothyroidism    LBBB (left bundle branch block)    Migraine    Nocturia    Overactive bladder    Pneumonia    hx of at age 7    Past Surgical History:  Procedure Laterality Date   CARPAL TUNNEL RELEASE Bilateral    CORONARY ARTERY BYPASS GRAFT N/A 01/04/2024   Procedure: CORONARY ARTERY BYPASS GRAFTING TIMES THREE USING LEFT INTERNAL MAMMARY ARTERY AND RIGHT GREAT SAPHENOUS VEIN HARVESTED ENDOSCOPICALLY;  Surgeon: Hilarie Lovely, MD;  Location: [facility] OR;  Service: Open Heart Surgery;  Laterality: N/A;   INTRAOPERATIVE TRANSESOPHAGEAL ECHOCARDIOGRAM N/A 01/04/2024   Procedure: ECHOCARDIOGRAM, TRANSESOPHAGEAL, INTRAOPERATIVE;  Surgeon: Hilarie Lovely, MD;  Location: [facility] OR;  Service: Open Heart Surgery;  Laterality: N/A;   PROSTATE SURGERY     RIGHT/LEFT HEART CATH AND CORONARY ANGIOGRAPHY Bilateral 12/15/2023   Procedure: RIGHT/LEFT HEART CATH AND CORONARY ANGIOGRAPHY;  Surgeon:  Sammy Crisp, MD;  Location: [facility] INVASIVE CV LAB;  Service: Cardiovascular;  Laterality: Bilateral;   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   Procedure: LEFT TOTAL HIP ARTHROPLASTY ANTERIOR APPROACH;  Surgeon: Claiborne Crew, MD;  Location: [facility];  Service: Orthopedics;  Laterality: Left;   Patient Active Problem List   Diagnosis Date Noted   Cardioembolic stroke (HCC) 01/12/2024   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   Angina pectoris (HCC) 12/15/2023   HFrEF (heart failure with reduced ejection fraction) (HCC) 12/15/2023   Palpitations 12/16/2019   PVC's (premature ventricular contractions) 12/16/2019   Left bundle branch block 12/16/2019   Essential hypertension 12/16/2019   Osteoarthritis of knee 06/07/2018   S/P left THA, 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    ONSET DATE: 01/12/24  REFERRING DIAG: I63.9 (ICD-10-CM) - Cardioembolic stroke (HCC)   THERAPY DIAG:  Abnormality of gait and mobility  Difficulty in walking, not elsewhere classified  Muscle weakness (generalized)  Other abnormalities of gait and mobility  Unsteadiness on feet  Rationale for Evaluation and Treatment: Rehabilitation  SUBJECTIVE:  SUBJECTIVE STATEMENT:  Pt reports a fall last night when trying to get out of bed. Pt hit his head on floor and has bruise on R side of post occiput. No LOC and pt has had no symptoms other than some brief disorientation in the min after the fall. Pt and wife stayed up to monitor symptoms last night. Instructed pt and wife to monitor for dizziness, headache, memory loss over next few days.   Reports planning on going to beach on May 14th, needing to navigate the sand.   NEXT MD VISIT: Patient reports Cardiologist released him after his visit yesterday on 02/19/2024. Next appt:  May 27th with cardiology PA   From  Initial Eval: [name] Pt finds standing regularly a challenge, has difficulty with bladder control, especially now that he is on lasix .   Pt accompanied by: significant other Tyler David   PERTINENT HISTORY: Tyler David is a 78 y.o. 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   PAIN:  Are you having pain? No  PRECAUTIONS: Fall, wearing Zio patch heart monitor  RED FLAGS: None   WEIGHT BEARING RESTRICTIONS: No  FALLS: Has patient fallen in last 6 months? No  LIVING ENVIRONMENT: Lives with: lives with their family and lives with their spouse Lives in: House/apartment Stairs: threshold step  Has following equipment at home: Single point cane and Walker - 2 wheeled  PLOF: Independent  PATIENT GOALS: Get energy, strength, and balance back to pre morbid function   OBJECTIVE:  Note: Objective measures were completed at Evaluation unless otherwise noted.  DIAGNOSTIC FINDINGS: MRI findings 3/22: IMPRESSION: 1. Multiple foci of acute/subacute nonhemorrhagic infarction is present throughout both cerebral hemispheres and left greater than right cerebellum. 2. Acute/subacute infarcts in the left ACA distribution. 3. Additional posteromedial parietal lobe infarcts bilaterally, left greater than right. 4. Diffuse punctate foci of acute nonhemorrhagic infarction across the ACA/MCA watershed territories bilaterally. 5. Bilateral cerebellar infarcts are more prominent on the left. 6. The constellation of findings suggest a central embolic source. 7. Mild periventricular T2 hyperintensities bilaterally likely reflect the sequela of chronic microvascular ischemia.     COGNITION: Overall cognitive status: Impaired and Pt reports his memory and sharpness are lessened since the stroke. Patient's wife/caregiver has to correct on a few occasions with some subjective information.    LOWER EXTREMITY ROM:     Within normal limits for tasks assessed  LOWER EXTREMITY MMT:    MMT Right Eval Left Eval  Hip flexion 4 4+  Hip extension    Hip abduction 4 4+  Hip adduction 4 4+  Hip internal rotation    Hip external rotation    Knee flexion 4 4+  Knee extension 4 4+  Ankle dorsiflexion 4 4+  Ankle plantarflexion    Ankle inversion    Ankle eversion    (Blank rows = not tested)  BED MOBILITY:  Not tested  TRANSFERS: Sit to stand: Complete Independence  Assistive device utilized: Single point cane     Stand to sit: Complete Independence  Assistive device utilized: None     Chair to chair: Modified independence  Assistive device utilized: None       RAMP:  Not tested  CURB:  Not  tested  STAIRS: Assess in future visit  GAIT: Has slight Trendelenburg gait and slow gait speed with decreased stance time on his right lower extremity.  This is more exacerbated with use of cane and even more exacerbated with without assistive device with comparison to utilizing walker.  FUNCTIONAL TESTS:  5 times sit to stand: 23 sec Timed up and go (TUG): 20.55 sec 10 meter walk test: 14.35 sec (.69 m/s) 2WW, 17.43 sec ( .56 m/s) SPC Berg Balance Scale: 43 6 Minute Walk Test:    PATIENT SURVEYS:  Stroke Impact Scale 16 46/80                                                                                                                              TREATMENT DATE: 02/27/24   TE- To improve strength, endurance, mobility, and function of specific targeted muscle groups or improve joint range of motion or improve muscle flexibility  Octane level 1 x 6 min LE only   TA- To improve functional movements patterns for everyday tasks   [facility] training:  Airex  ant step on / off 2 x 10 ea LE  Red mat walking with AW underneath for simulaiton of unlevel surface x 10 times over it  -added step at end for simulation on / off of beach access stairs x another 10 reps   Sit to stand x 15 reps without UE suppport, some knee discomfort on rep 13-15  Resisted gait  lateral stepping using matrix cable machine x 5 laps both directions with 7.5# - rest b/w sets   Provided seated rest breaks throughout session during vital sign assessment.        PATIENT EDUCATION: Education details: POC Person educated: Patient Education method: Explanation Education comprehension: verbalized understanding   HOME EXERCISE PROGRAM:  Access Code: [email or web address] URL: [email or web address] Date: 02/06/2024 Prepared by: Carlen Chasten  Exercises - Sit to Stand Without Arm Support  - 1 x daily - 7 x weekly - 3 sets - 10 reps - Standing March with Counter Support  - 1 x daily - 7 x weekly - 3 sets - 20 reps - Heel Raises with Counter Support  - 1 x daily - 7 x weekly - 3 sets - 10 reps - Standing Hamstring Curl with RW  - 1 x daily - 7 x weekly - 3 sets - 10 reps - Backward Walking with Counter Support  - 1 x daily - 7 x weekly - 3 sets - 10 reps   GOALS: Goals reviewed with patient? Yes  SHORT TERM GOALS: Target date: 02/27/2024   Patient will be independent in home exercise program to improve strength/mobility for better functional independence with ADLs. Baseline: No HEP currently  Goal status: INITIAL   LONG TERM GOALS: Target date: 04/23/2024    1.  Patient (> 88 years old) will complete five times sit to stand test in < 15 seconds indicating an increased LE strength and  improved balance. Baseline: 23 sec Goal status: INITIAL  2.  Patient will improve SIS-16 score to 60   to demonstrate statistically significant improvement in mobility and quality of life as it relates to their mobility and QOL as a result of their CVA.  Baseline: 46 Goal  status: INITIAL   3.  Patient will increase Berg Balance score by > 6 points to demonstrate decreased fall risk during functional activities. Baseline: 43 Goal status: INITIAL   4.   Patient will reduce timed up and go to <11 seconds  to reduce fall risk and demonstrate improved transfer/gait ability. Baseline: 20.55 sec no AD Goal status: INITIAL  5.   Patient will increase 10 meter walk test to >1.10m/s as to improve gait speed for better community ambulation and to reduce fall risk. Baseline: 14.35 sec (.69 m/s) 2WW, 17.43 sec ( .56 m/s) SPC Goal status: INITIAL  6.   Patient will increase six minute walk test distance to >1000 for progression to community ambulator and improve gait ability Baseline: 02/01/2024: 858ft unsure of AD usage Goal status: INITIAL    ASSESSMENT:  CLINICAL IMPRESSION:  Patient is a 78 year old male who presents to physical therapy following a cerebrovascular accident as well as a procedure for heart revascularization that happened 2 days prior to his CVA. Treatment focussed on balance, endurance, and functional activities in preparation for a beach trip he is leaving for tomorrow. Pt still waiting to hear from cardiologist regarding referral to cardiac rehab. Pt will continue to benefit from skilled physical therapy intervention to address impairments, improve QOL, and attain therapy goals.    OBJECTIVE IMPAIRMENTS: Abnormal gait, decreased activity tolerance, decreased balance, decreased endurance, decreased mobility, difficulty walking, decreased strength, and decreased safety awareness.   ACTIVITY LIMITATIONS: carrying, standing, squatting, and stairs  PARTICIPATION LIMITATIONS: meal prep, driving, community activity, and yard work  PERSONAL FACTORS: Age and 3+ comorbidities: hypertension migraine headaches hyperlipidemia, CKD stage III, prostate cancer, left total hip arthroplasty 09/29/2015, CAD/cardiomyopathy left bundle branch block are also  affecting patient's functional outcome.   REHAB POTENTIAL: Good  CLINICAL DECISION MAKING: Evolving/moderate complexity  EVALUATION COMPLEXITY: Moderate  PLAN:  PT FREQUENCY: 2x/week  PT DURATION: 12 weeks  PLANNED INTERVENTIONS: 97110-Therapeutic exercises, 97530- Therapeutic activity, 97112- Neuromuscular re-education, 97535- Self Care, 78469- Manual therapy, [id number]- Gait training, Patient/Family education, Balance training, and Stair training  PLAN FOR NEXT SESSION:  - higher level dynamic gait and balance challenges  - stair navigation training using only L HR support to prepare for camper entrance  - Proximal R hip and overall LE strengthening    Edwina Gram PT ,DPT Physical Therapist- [facility]  [facility]   11:01 AM 02/27/24

## 2024-02-28 ENCOUNTER — Telehealth: Payer: Self-pay | Admitting: Internal Medicine

## 2024-02-28 DIAGNOSIS — M542 Cervicalgia: Secondary | ICD-10-CM | POA: Diagnosis not present

## 2024-02-28 DIAGNOSIS — S060X0A Concussion without loss of consciousness, initial encounter: Secondary | ICD-10-CM | POA: Diagnosis not present

## 2024-02-28 DIAGNOSIS — S0990XA Unspecified injury of head, initial encounter: Secondary | ICD-10-CM | POA: Diagnosis not present

## 2024-02-28 DIAGNOSIS — W1830XA Fall on same level, unspecified, initial encounter: Secondary | ICD-10-CM | POA: Diagnosis not present

## 2024-02-28 DIAGNOSIS — R410 Disorientation, unspecified: Secondary | ICD-10-CM | POA: Diagnosis not present

## 2024-02-28 DIAGNOSIS — R519 Headache, unspecified: Secondary | ICD-10-CM | POA: Diagnosis not present

## 2024-02-28 NOTE — Telephone Encounter (Signed)
 Pt c/o being dizzy and "little" HA  for two days since falling with head strike  PT reports tripping on his shoes on Monday night, fell backwards and the back of his head struck the floor, wife told him he was "answering questions all crazy" for about 5 mins afterwards, pt reports bruising in the area of the strike, pt denies loss of consciousness  Pt is en route to vacation at [employer] to go camping, pt is with wife and daughter, pt directed to go to ED and have CT and neck CT, pt verbalized importance of scan - especially with anticoags and ASA therapy, pt reports that he will go to ED

## 2024-02-28 NOTE — Telephone Encounter (Signed)
 Pt c/o medication issue:  1. Name of Medication: Blood thinner  2. How are you currently taking this medication (dosage and times per day)?   3. Are you having a reaction (difficulty breathing--STAT)?   4. What is your medication issue?    Patient stated he had a bad fall and had a mild concussion.  Patient wants to know if he should stop his blood thinner.

## 2024-02-29 ENCOUNTER — Ambulatory Visit

## 2024-03-01 NOTE — Telephone Encounter (Signed)
 According to Care Everywhere, the patient went to [facility] on 02/28/24 for evaluation.

## 2024-03-01 NOTE — Telephone Encounter (Signed)
 Agree with recommendation for patient to be evaluated in the ED.  Please contact patient and family to ensure this was followed through with.

## 2024-03-04 ENCOUNTER — Ambulatory Visit: Payer: Self-pay | Admitting: Physician Assistant

## 2024-03-04 DIAGNOSIS — I480 Paroxysmal atrial fibrillation: Secondary | ICD-10-CM | POA: Diagnosis not present

## 2024-03-05 ENCOUNTER — Ambulatory Visit: Admitting: Physical Therapy

## 2024-03-08 NOTE — Progress Notes (Unsigned)
 Cardiology Office Note    Date:  03/12/2024   ID:  Tyler David, DOB 1946/05/20, MRN [id number]  PCP:  Roselind Congo, MD  Cardiologist:  Sammy Crisp, MD  Electrophysiologist:  None   Chief Complaint: Follow up  History of Present Illness:   Tyler David is a 78 y.o. male with history of CAD s/p three-vessel CABG on 01/04/2024 with LIMA to LAD, SVG to OM, and SVG to PDA complicated by postoperative multi distribution CVAs, postoperative A-fib, recently diagnosed cardiomyopathy, HTN, HLD, hypothyroidism, prostate cancer, CKD stage III, LBBB, mild left-sided carotid artery stenosis estimated at 1 to 39%, mild PAD involving the left lower extremity, GERD, and migraine headaches who presents for follow up of CAD and cardiomyopathy.   He was initially evaluated by our office in 12/2019 for palpitations and newly diagnosed LBBB.  At that time, he was asymptomatic other than sporadic palpitations.  Echo in 01/2020 showed an EF of 55%, no regional wall motion normalities, mild LVH, grade 1 diastolic dysfunction, normal RV systolic function and ventricular cavity size, and moderately elevated RVSP estimated at 41.4 mmHg.  Lexiscan  MPI in 01/2020 was low risk with an EF of 55 to 65%.  LBBB noted.     He was evaluated by his PCP's office in 09/2023 for URI and chest discomfort.  He was started on pantoprazole .  Workup at that time included a normal D-dimer, BNP 570, chest x-ray with possible pulmonary edema, and a CT of the chest showed pulmonary residual edema, coronary artery calcification, and cholelithiasis.  Chronic left rib fractures were also noted.  He was started on furosemide  with some improvement in symptoms, though it ran out at time of his follow-up appointment with PCP in December.  At his visit with PCP in 10/2023, he was restarted on furosemide  20 mg daily.  Echo in 10/2023 showed an EF of 35 to 40%, mild global hypokinesis with severe inferior wall hypokinesis, grade 1 diastolic  dysfunction, normal RV systolic function and ventricular cavity size, mild to moderate mitral regurgitation, and an estimated right atrial pressure of 3 mmHg.  He was seen in our office in 11/2023 and reported a 2-3 history of exertional chest discomfort and burning that was exertional and progressive.  He underwent R/LHC in 11/2023 that showed severe three-vessel CAD with recommendation for CT surgery consultation for consideration of CABG.  RHC showed normal left and right filling pressures and normal cardiac output/index.  He was evaluated by CVTS and underwent three-vessel CABG on 01/04/2024.  Postoperative course was notable for weakness in the right lower extremity with subsequent CT scan revealing a left frontal CVAs, A-fib with RVR with some pauses of uncertain duration (scanned in telemetry strips do not reveal A-fib or pauses).  MRI of the brain revealed acute/subacute nonhemorrhagic infarction throughout both cerebral hemispheres and left greater than right cerebellum, as well as acute/subacute infarct in the left ACA distribution and additionally there were posterior medial parietal lobe infarctions bilaterally left greater than right as well as acute nonhemorrhagic infarct in the ACA/MCA watershed territory bilaterally.  He was evaluated by neurology with recommendation to be placed on apixaban  and aspirin .  Remaining postoperative course was also notable for volume overload complicated by acute on CKD, expected acute blood loss anemia, and HTN.  He was discharged to inpatient rehab.  He followed up in our office on 01/31/2024 noting ongoing fatigue following his complex hospitalization.  Subsequent Zio patch showed a predominant rhythm of sinus with rare  PACs and PVCs and no evidence of A-fib or other significant arrhythmia.  He suffered a mechanical fall earlier this month while attempting to put on his shoes.  He did hit the back of his head.  He was evaluated at [facility] in [facility],  [place]  with CT head showing no acute intracranial process.  Treated for a concussion.  He comes in accompanied by his wife today and is without symptoms of angina or cardiac decompensation.  He does continue to note fatigue.  He is participating with physical therapy.  Eager to start cardiac rehab.  No further falls.  Headaches and dizziness have improved following mechanical fall.  No known hematochezia or melena.  Nuisance bruising along the upper extremities on apixaban .  No presyncope or syncope.  Has taken as needed furosemide  once since he was last seen.  No significant lower extremity swelling or progressive orthopnea.  Has been released to drive by cardiothoracic surgery, though they have recommended the patient be cleared by neurology given strokes.  Prefers to avoid PCSK9 inhibitor given need to inject medication.   Labs independently reviewed: 01/2024 - Hgb 11.2, PLT 323, BUN 30, serum creatinine 2.13, potassium 3.9 12/2023 - BNP 1263, albumin  2.5, AST/ALT normal, magnesium  3.2, TC 170, TG 159, HDL 26, LDL 112, A1c 5.4  Past Medical History:  Diagnosis Date   Arthritis    Cancer (HCC)    prostate cancer    Cardiomyopathy (HCC)    Chronic kidney disease, stage 3 (HCC)    Coronary artery disease    ED (erectile dysfunction)    GERD (gastroesophageal reflux disease)    Headache    hx of migraine-2015    Hyperlipidemia    Hypertension    Hypothyroidism    LBBB (left bundle branch block)    Migraine    Nocturia    Overactive bladder    Pneumonia    hx of at age 79     Past Surgical History:  Procedure Laterality Date   CARPAL TUNNEL RELEASE Bilateral    CORONARY ARTERY BYPASS GRAFT N/A 01/04/2024   Procedure: CORONARY ARTERY BYPASS GRAFTING TIMES THREE USING LEFT INTERNAL MAMMARY ARTERY AND RIGHT GREAT SAPHENOUS VEIN HARVESTED ENDOSCOPICALLY;  Surgeon: Hilarie Lovely, MD;  Location: [facility] OR;  Service: Open Heart Surgery;  Laterality: N/A;   INTRAOPERATIVE  TRANSESOPHAGEAL ECHOCARDIOGRAM N/A 01/04/2024   Procedure: ECHOCARDIOGRAM, TRANSESOPHAGEAL, INTRAOPERATIVE;  Surgeon: Hilarie Lovely, MD;  Location: [facility] OR;  Service: Open Heart Surgery;  Laterality: N/A;   PROSTATE SURGERY     RIGHT/LEFT HEART CATH AND CORONARY ANGIOGRAPHY Bilateral 12/15/2023   Procedure: RIGHT/LEFT HEART CATH AND CORONARY ANGIOGRAPHY;  Surgeon: Sammy Crisp, MD;  Location: [facility] INVASIVE CV LAB;  Service: Cardiovascular;  Laterality: Bilateral;   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   Procedure: LEFT TOTAL HIP ARTHROPLASTY ANTERIOR APPROACH;  Surgeon: Claiborne Crew, MD;  Location: [facility] ORS;  Service: Orthopedics;  Laterality: Left;    Current Medications: Current Meds  Medication Sig   amLODipine  (NORVASC ) 5 MG tablet Take 1 tablet (5 mg total) by mouth daily.   aspirin  EC 81 MG tablet Take 1 tablet (81 mg total) by mouth daily. Swallow whole.   Bempedoic Acid  (NEXLETOL ) 180 MG TABS Take 1 tablet (180 mg total) by mouth daily.   cholecalciferol  (VITAMIN D3) 25 MCG (1000 UNIT) tablet Take 1 tablet (1,000 Units total) by mouth daily.   docusate sodium  (COLACE) 100 MG capsule Take 2 capsules (200 mg total) by mouth  daily.   famotidine  (PEPCID ) 20 MG tablet Take 1 tablet (20 mg total) by mouth every evening.   fluticasone  (FLONASE ) 50 MCG/ACT nasal spray Place into both nostrils daily.   furosemide  (LASIX ) 40 MG tablet Take 1 tablet (40 mg total) by mouth daily as needed (for increased shortness of breath, lower extremity swelling, or weight gain greater than 3 pounds overnight or 5 pounds in a 7-day time span).   levothyroxine  (SYNTHROID ) 50 MCG tablet Take 1 tablet (50 mcg total) by mouth daily before breakfast.   MAGNESIUM  PO Take 1 tablet by mouth daily.   melatonin 5 MG TABS Take 1 tablet (5 mg total) by mouth at bedtime.   metoprolol  succinate (TOPROL -XL) 25 MG 24 hr tablet Take 1 tablet (25 mg total) by mouth daily. Take with or immediately following a meal.    Multiple Vitamins-Minerals (ICAPS AREDS 2 PO) Take 1 capsule by mouth 2 (two) times daily.   nitroGLYCERIN  (NITROSTAT ) 0.4 MG SL tablet Place 1 tablet (0.4 mg total) under the tongue every 5 (five) minutes as needed for chest pain.   [DISCONTINUED] amiodarone  (PACERONE ) 200 MG tablet Take 1 tablet (200 mg total) by mouth daily.   [DISCONTINUED] apixaban  (ELIQUIS ) 5 MG TABS tablet Take 1 tablet (5 mg total) by mouth 2 (two) times daily.   [DISCONTINUED] ezetimibe  (ZETIA ) 10 MG tablet Take 1 tablet (10 mg total) by mouth daily.   [DISCONTINUED] metoprolol  tartrate (LOPRESSOR ) 25 MG tablet Take 0.5 tablets (12.5 mg total) by mouth 2 (two) times daily.    Allergies:   Lipitor [atorvastatin], Pravachol [pravastatin], Statins, Zocor [simvastatin], and Penicillins   Social History   Socioeconomic History   Marital status: Married    Spouse name: Not on file   Number of children: 2   Years of education: Not on file   Highest education level: Not on file  Occupational History   Not on file  Tobacco Use   Smoking status: Never   Smokeless tobacco: Never  Vaping Use   Vaping status: Never Used  Substance and Sexual Activity   Alcohol use: Not Currently   Drug use: No   Sexual activity: Not on file  Other Topics Concern   Not on file  Social History Narrative   Lives with [place], wife, and indoor pet-dog.    Social Drivers of Corporate investment banker Strain: Not on file  Food Insecurity: No Food Insecurity (01/04/2024)   Hunger Vital Sign    Worried About Running Out of Food in the Last Year: Never true    Ran Out of Food in the Last Year: Never true  Transportation Needs: No Transportation Needs (01/04/2024)   PRAPARE - Administrator, Civil Service (Medical): No    Lack of Transportation (Non-Medical): No  Physical Activity: Not on file  Stress: Not on file  Social Connections: Socially Integrated (01/04/2024)   Social Connection and Isolation Panel [NHANES]     Frequency of Communication with Friends and Family: More than three times a week    Frequency of Social Gatherings with Friends and Family: Three times a week    Attends Religious Services: More than 4 times per year    Active Member of Clubs or Organizations: Yes    Attends Engineer, structural: More than 4 times per year    Marital Status: Married     Family History:  The patient's family history includes Atrial fibrillation in his brother; COPD in his father; Heart  failure in his father; Ovarian cancer in his mother; Prostate cancer in his brother.  ROS:   12-point review of systems is negative unless otherwise noted in the HPI.   EKGs/Labs/Other Studies Reviewed:    Studies reviewed were summarized above. The additional studies were reviewed today:  Zio patch 01/2024:   The patient was monitored for 14 days.   The predominant rhythm was sinus with an average rate of 69 bpm (range 57-106).   There were rare PACs and PVCs.   No sustained arrhythmia or prolonged pause was observed.   There were no patient triggered events.   Predominantly sinus rhythm with rare PACs and PVCs.  No evidence of atrial fibrillation or other significant arrhythmia. __________  Intraoperative TEE 01/04/2024: POST-OP IMPRESSIONS  _ Left Ventricle: The left ventriclar function is mildly reduced on  inotropic  support. EF is approximately 40%. Hypokinesis of inferior and inferoseptal  walls  unchanged from prebypass evaluation.  _ Right Ventricle: The right ventriclar function is normal on inotropic  support.  _ Aorta: The aorta appears unchanged from pre-bypass. There is no  dissection.  _ Aortic Valve: The aortic valve appears unchanged from pre-bypass. Mild  AI.  _ Mitral Valve: The mitral valve appears unchanged from pre-bypass. Mild  MR.  _ Tricuspid Valve: The tricuspid valve appears unchanged from pre-bypass.  Trace  TR.  _ Pulmonic Valve: The pulmonic valve appears unchanged from  pre-bypass.  Mild PI  __________   Pre-CABG vascular ultrasound 01/02/2024: Summary:  Right Carotid: Velocities in the right ICA are consistent with a 1-39%  stenosis.   Left Carotid: There is no evidence of stenosis in the left ICA.  Vertebrals:  Bilateral vertebral arteries demonstrate antegrade flow.  Subclavians: Normal flow hemodynamics were seen in bilateral subclavian               arteries.   Right ABI: Resting right ankle-brachial index is within normal range. The  right toe-brachial index is mildly decreased.  Left ABI: Resting left ankle-brachial index indicates mild left lower  extremity arterial disease. The left toe-brachial index is abnormal.  Bilateral Extremity: Doppler waveforms remain within normal limits with  compression bilaterally for the radial arteries. Doppler waveforms remain  within normal limits with compression bilaterally for the ulnar arteries.  __________   [facility] 12/15/2023: Conclusions: Severe three-vessel coronary artery disease, as detailed below. Normal left and right heart filling pressures. Normal Fick cardiac output/index.   Recommendations: Outpatient cardiac surgery consultation for CABG. Change furosemide  to as needed for weight gain/edema. Continue escalation of GDMT as tolerated. Consider adding PCSK9 inhibitor at follow-up, given intolerance to multiple statins. __________   2D echo 11/09/2023: 1. Left ventricular ejection fraction, by estimation, is 35 to 40%. Left  ventricular ejection fraction by PLAX is 35 %. The left ventricle has  moderately decreased function. The left ventricle demonstrates mild global  hypokinesis with severe inferior  wall hypokinesis. Left ventricular diastolic parameters are consistent  with Grade I diastolic dysfunction (impaired relaxation).   2. Right ventricular systolic function is normal. The right ventricular  size is normal.   3. The mitral valve is normal in structure. Mild to moderate mitral  valve  regurgitation. No evidence of mitral stenosis.   4. The aortic valve is normal in structure. Aortic valve regurgitation is  not visualized. No aortic stenosis is present.   5. The inferior vena cava is normal in size with greater than 50%  respiratory variability, suggesting right atrial pressure of  3 mmHg.  __________   Lexiscan  MPI 02/10/2020: The study is normal. This is a low risk study. The left ventricular ejection fraction is normal (55-65%). LBBB __________   2D echo 01/24/2020: 1. Left ventricular ejection fraction, by estimation, is 55%. The left  ventricle has normal function. The left ventricle has no regional wall  motion abnormalities. There is mild left ventricular hypertrophy. Left  ventricular diastolic parameters are  consistent with Grade I diastolic dysfunction (impaired relaxation).   2. Right ventricular systolic function is normal. The right ventricular  size is normal. There is moderately elevated pulmonary artery systolic  pressure.    EKG:  EKG is ordered today.  The EKG ordered today demonstrates NSR, 68 bpm, LBBB (stable and known)  Recent Labs: 01/08/2024: Magnesium  3.2 01/15/2024: ALT 17; B Natriuretic Peptide 1,263.2 01/31/2024: BUN 30; Creatinine, Ser 2.13; Hemoglobin 11.2; Platelets 323; Potassium 3.9; Sodium 142  Recent Lipid Panel    Component Value Date/Time   CHOL 170 01/07/2024 0819   TRIG 159 (H) 01/07/2024 0819   HDL 26 (L) 01/07/2024 0819   CHOLHDL 6.5 01/07/2024 0819   VLDL 32 01/07/2024 0819   LDLCALC 112 (H) 01/07/2024 0819    PHYSICAL EXAM:    VS:  BP (!) 150/60   Pulse 68   Ht 5\' 8"  (1.727 m)   Wt 198 lb 3.2 oz (89.9 kg)   SpO2 97%   BMI 30.14 kg/m   BMI: Body mass index is 30.14 kg/m.  Physical Exam Vitals reviewed.  Constitutional:      Appearance: He is well-developed.  HENT:     Head: Normocephalic and atraumatic.  Eyes:     General:        Right eye: No discharge.        Left eye: No discharge.   Cardiovascular:     Rate and Rhythm: Normal rate and regular rhythm.     Heart sounds: Normal heart sounds, S1 normal and S2 normal. Heart sounds not distant. No midsystolic click and no opening snap. No murmur heard.    No friction rub.  Pulmonary:     Effort: Pulmonary effort is normal. No respiratory distress.     Breath sounds: Normal breath sounds. No decreased breath sounds, wheezing, rhonchi or rales.  Chest:     Chest wall: No tenderness.  Musculoskeletal:     Cervical back: Normal range of motion.     Right lower leg: No edema.     Left lower leg: No edema.  Skin:    General: Skin is warm and dry.     Nails: There is no clubbing.  Neurological:     Mental Status: He is alert and oriented to person, place, and time.  Psychiatric:        Speech: Speech normal.        Behavior: Behavior normal.        Thought Content: Thought content normal.        Judgment: Judgment normal.     Wt Readings from Last 3 Encounters:  03/12/24 198 lb 3.2 oz (89.9 kg)  02/19/24 192 lb (87.1 kg)  02/05/24 188 lb 9.6 oz (85.5 kg)     ASSESSMENT & PLAN:   CAD status post three-vessel CABG: He is doing well and without symptoms concerning for angina or cardiac decompensation following surgical revascularization.  He does continue to note some ongoing fatigue, participation with cardiac rehab will help with this.  Continue aggressive risk factor modification and secondary prevention  including aspirin  81 mg, amlodipine  5 mg, Toprol -XL 25 mg daily in place of Lopressor ,, and ezetimibe  10 mg.  Okay to participate with cardiac rehab.  Intolerant to statins with recommendation to start bempedoic acid as outlined below.  Prefers to avoid injectable medications.  No indication for further ischemic testing at this time.  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  NYHA class difficult to assess at this time secondary to more sedentary lifestyle in the postoperative state with  recommendation to increase activity.  LBBB predates cardiomyopathy.  To further escalate GDMT stop Lopressor  and start Toprol -XL 25 mg daily.  Renal dysfunction precludes addition of ACE inhibitor, ARB, ARNI, or MRA at this time.  He also remains on furosemide  40 mg as needed.  Repeat echo in 2 months time following surgical revascularization.  Should his cardiomyopathy persist at that time would like to add SGLT2 inhibitor and further optimize pharmacotherapy as able based on labs and vital signs.  PAF: Maintaining sinus rhythm.  Subsequent Zio patch showed no evidence of recurrent A-fib.  Discontinue amiodarone .  Transition Lopressor  to Toprol -XL as outlined above.  CHA2DS2-VASc at least 7 (CHF, HTN, age x 2, CVA x 2, vascular disease).  Remains on apixaban  5 mg twice daily with no symptoms concerning for bleeding.  Recent labs stable.  Multidistribution CVAs: Occurring postoperatively versus new onset A-fib.  Now on apixaban  and aspirin  per recommendation from neurology.  Also on ezetimibe  with initiation of bempedoic acid as outlined below.  Follow-up with neurology as directed.  No evidence of left atrial or left atrial appendage thrombus on intraoperative TEE.  Given he will be maintained on apixaban  indefinitely no indication for EP referral for loop as it will not change management.  Mitral regurgitation: Mild by TEE intraoperatively in 12/2023.  Monitor.  Carotid artery stenosis: Pre-CABG carotid artery ultrasound showed 1 to 39% right ICA stenosis with no evidence of stenosis in the left ICA.  Currently on aspirin  and ezetimibe  with plans to initiate bempedoic acid.  HTN: Blood pressure is mildly elevated in the office today.  Remains on amlodipine  5 mg and on Toprol -XL milligram daily.  Escalate as indicated.  HLD with statin intolerance: LDL 112 in 12/2023 with target LDL less than 55.  Intolerant to low, medium, and high intensity statins secondary to myalgias.  Remains on ezetimibe  10 mg  daily, though LDL not at goal.  Prefers to avoid injectable medications.  Start bempedoic acid 180 mg daily.  PAD: Pre-CABG vascular imaging showed mild left lower extremity arterial disease.  No symptoms of lifestyle limiting claudication or nonhealing wounds.  Aspirin  and lipid therapy as outlined above.  CKD stage IIIb: Stable on most recent check.  Ongoing management per PCP.      Disposition: F/u with Dr. Nolan Battle or an APP in 3 months.   Medication Adjustments/Labs and Tests Ordered: Current medicines are reviewed at length with the patient today.  Concerns regarding medicines are outlined above. Medication changes, Labs and Tests ordered today are summarized above and listed in the Patient Instructions accessible in Encounters.   Signed, Varney Gentleman, PA-C 03/12/2024 11:46 AM     [facility] [place] [address] [place], [place] [address] [phone]

## 2024-03-12 ENCOUNTER — Telehealth: Payer: Self-pay

## 2024-03-12 ENCOUNTER — Ambulatory Visit: Admitting: Physical Therapy

## 2024-03-12 ENCOUNTER — Ambulatory Visit: Attending: Physician Assistant | Admitting: Physician Assistant

## 2024-03-12 ENCOUNTER — Encounter: Payer: Self-pay | Admitting: Physician Assistant

## 2024-03-12 VITALS — BP 150/60 | HR 68 | Ht 68.0 in | Wt 198.2 lb

## 2024-03-12 DIAGNOSIS — I739 Peripheral vascular disease, unspecified: Secondary | ICD-10-CM | POA: Diagnosis not present

## 2024-03-12 DIAGNOSIS — R262 Difficulty in walking, not elsewhere classified: Secondary | ICD-10-CM

## 2024-03-12 DIAGNOSIS — I5020 Unspecified systolic (congestive) heart failure: Secondary | ICD-10-CM

## 2024-03-12 DIAGNOSIS — I6521 Occlusion and stenosis of right carotid artery: Secondary | ICD-10-CM | POA: Diagnosis not present

## 2024-03-12 DIAGNOSIS — I340 Nonrheumatic mitral (valve) insufficiency: Secondary | ICD-10-CM

## 2024-03-12 DIAGNOSIS — I639 Cerebral infarction, unspecified: Secondary | ICD-10-CM | POA: Diagnosis not present

## 2024-03-12 DIAGNOSIS — I10 Essential (primary) hypertension: Secondary | ICD-10-CM | POA: Diagnosis not present

## 2024-03-12 DIAGNOSIS — I255 Ischemic cardiomyopathy: Secondary | ICD-10-CM | POA: Diagnosis not present

## 2024-03-12 DIAGNOSIS — Z789 Other specified health status: Secondary | ICD-10-CM | POA: Diagnosis not present

## 2024-03-12 DIAGNOSIS — N183 Chronic kidney disease, stage 3 unspecified: Secondary | ICD-10-CM

## 2024-03-12 DIAGNOSIS — I480 Paroxysmal atrial fibrillation: Secondary | ICD-10-CM | POA: Diagnosis not present

## 2024-03-12 DIAGNOSIS — I2510 Atherosclerotic heart disease of native coronary artery without angina pectoris: Secondary | ICD-10-CM | POA: Diagnosis not present

## 2024-03-12 DIAGNOSIS — I447 Left bundle-branch block, unspecified: Secondary | ICD-10-CM

## 2024-03-12 DIAGNOSIS — R269 Unspecified abnormalities of gait and mobility: Secondary | ICD-10-CM

## 2024-03-12 DIAGNOSIS — R2689 Other abnormalities of gait and mobility: Secondary | ICD-10-CM

## 2024-03-12 DIAGNOSIS — E785 Hyperlipidemia, unspecified: Secondary | ICD-10-CM

## 2024-03-12 DIAGNOSIS — R2681 Unsteadiness on feet: Secondary | ICD-10-CM

## 2024-03-12 DIAGNOSIS — M6281 Muscle weakness (generalized): Secondary | ICD-10-CM

## 2024-03-12 DIAGNOSIS — Z951 Presence of aortocoronary bypass graft: Secondary | ICD-10-CM

## 2024-03-12 MED ORDER — EZETIMIBE 10 MG PO TABS: 10.0000 mg | ORAL_TABLET | Freq: Every day | ORAL | 3 refills | Status: AC

## 2024-03-12 MED ORDER — APIXABAN 5 MG PO TABS: 5.0000 mg | ORAL_TABLET | Freq: Two times a day (BID) | ORAL | 3 refills | Status: AC

## 2024-03-12 MED ORDER — NEXLETOL 180 MG PO TABS: 180.0000 mg | ORAL_TABLET | Freq: Every day | ORAL | 3 refills | Status: DC

## 2024-03-12 MED ORDER — METOPROLOL SUCCINATE ER 25 MG PO TB24: 25.0000 mg | ORAL_TABLET | Freq: Every day | ORAL | 3 refills | Status: DC

## 2024-03-12 NOTE — Therapy (Signed)
 OUTPATIENT PHYSICAL THERAPY NEURO TREATMENT   Patient Name: Tyler David MRN: [id number] DOB:October 15, 1946, 78 y.o., male Today's Date: 03/12/2024   PCP:   Roselind Congo, MD   REFERRING PROVIDER:    Sterling Eisenmenger, PA-C    END OF SESSION:   PT End of Session - 03/12/24 1538     Visit Number 10    Number of Visits 24    Date for PT Re-Evaluation 04/23/24    Progress Note Due on Visit 10    PT Start Time 1535    PT Stop Time 1615    PT Time Calculation (min) 40 min    Equipment Utilized During Treatment Gait belt    Activity Tolerance Patient tolerated treatment well    Behavior During Therapy WFL for tasks assessed/performed                  Past Medical History:  Diagnosis Date   Arthritis    Cancer (HCC)    prostate cancer    Cardiomyopathy (HCC)    Chronic kidney disease, stage 3 (HCC)    Coronary artery disease    ED (erectile dysfunction)    GERD (gastroesophageal reflux disease)    Headache    hx of migraine-2015    Hyperlipidemia    Hypertension    Hypothyroidism    LBBB (left bundle branch block)    Migraine    Nocturia    Overactive bladder    Pneumonia    hx of at age 8    Past Surgical History:  Procedure Laterality Date   CARPAL TUNNEL RELEASE Bilateral    CORONARY ARTERY BYPASS GRAFT N/A 01/04/2024   Procedure: CORONARY ARTERY BYPASS GRAFTING TIMES THREE USING LEFT INTERNAL MAMMARY ARTERY AND RIGHT GREAT SAPHENOUS VEIN HARVESTED ENDOSCOPICALLY;  Surgeon: Hilarie Lovely, MD;  Location: [facility] OR;  Service: Open Heart Surgery;  Laterality: N/A;   INTRAOPERATIVE TRANSESOPHAGEAL ECHOCARDIOGRAM N/A 01/04/2024   Procedure: ECHOCARDIOGRAM, TRANSESOPHAGEAL, INTRAOPERATIVE;  Surgeon: Hilarie Lovely, MD;  Location: [facility] OR;  Service: Open Heart Surgery;  Laterality: N/A;   PROSTATE SURGERY     RIGHT/LEFT HEART CATH AND CORONARY ANGIOGRAPHY Bilateral 12/15/2023   Procedure: RIGHT/LEFT HEART CATH AND CORONARY ANGIOGRAPHY;  Surgeon:  Sammy Crisp, MD;  Location: [facility] INVASIVE CV LAB;  Service: Cardiovascular;  Laterality: Bilateral;   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   Procedure: LEFT TOTAL HIP ARTHROPLASTY ANTERIOR APPROACH;  Surgeon: Claiborne Crew, MD;  Location: [facility] ORS;  Service: Orthopedics;  Laterality: Left;   Patient Active Problem List   Diagnosis Date Noted   Cardioembolic stroke (HCC) 01/12/2024   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  Rationale for Evaluation and Treatment: Rehabilitation  SUBJECTIVE:  SUBJECTIVE STATEMENT:  Pt reports no falls since last visit. Reports still having difficulty with endurance but balance continues to show improvements.   Reports planning on going to beach on May 14th, needing to navigate the sand.   NEXT MD VISIT: Patient reports Cardiologist released him after his visit yesterday on 02/19/2024. Next appt:  May 27th with cardiology PA   From Initial Eval: [name] Pt finds standing regularly a challenge, has difficulty with bladder control, especially now that he is on lasix .   Pt accompanied by: significant other [name]   PERTINENT HISTORY: [name] is a 78 y.o.  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   PAIN:  Are you having pain? No  PRECAUTIONS: Fall, wearing Zio patch heart monitor  RED FLAGS: None   WEIGHT BEARING RESTRICTIONS: No  FALLS: Has patient fallen in last 6 months? No  LIVING ENVIRONMENT: Lives with: lives with their family and lives with their spouse Lives in: House/apartment Stairs: threshold step  Has following equipment at home: Single point cane and Walker - 2 wheeled  PLOF: Independent  PATIENT GOALS: Get energy, strength, and balance back to pre morbid function   OBJECTIVE:  Note: Objective measures were completed at Evaluation unless otherwise noted.  DIAGNOSTIC FINDINGS: MRI findings 3/22: IMPRESSION: 1. Multiple foci of acute/subacute nonhemorrhagic infarction is present throughout both cerebral hemispheres and left greater than right cerebellum. 2. Acute/subacute infarcts in the left ACA distribution. 3. Additional posteromedial parietal lobe infarcts bilaterally, left greater than right. 4. Diffuse punctate foci of acute nonhemorrhagic infarction across the ACA/MCA watershed territories bilaterally. 5. Bilateral cerebellar infarcts are more prominent on the left. 6. The constellation of findings suggest a central embolic source. 7. Mild periventricular T2 hyperintensities bilaterally likely reflect the sequela of chronic microvascular ischemia.    COGNITION: Overall cognitive status: Impaired and Pt reports his memory and sharpness are lessened since the stroke. Patient's wife/caregiver has to correct on a few occasions with some subjective information.    LOWER EXTREMITY ROM:     Within normal limits for tasks assessed  LOWER EXTREMITY MMT:    MMT Right Eval Left Eval  Hip flexion 4 4+  Hip extension    Hip abduction 4 4+   Hip adduction 4 4+  Hip internal rotation    Hip external rotation    Knee flexion 4 4+  Knee extension 4 4+  Ankle dorsiflexion 4 4+  Ankle plantarflexion    Ankle inversion    Ankle eversion    (Blank rows = not tested)  BED MOBILITY:  Not tested  TRANSFERS: Sit to stand: Complete Independence  Assistive device utilized: Single point cane     Stand to sit: Complete Independence  Assistive device utilized: None     Chair to chair: Modified independence  Assistive device utilized: None       RAMP:  Not tested  CURB:  Not tested  STAIRS: Assess in future visit  GAIT: Has slight Trendelenburg gait and slow gait speed with decreased stance time on his right lower extremity.  This is more exacerbated with use of cane and even more exacerbated with without assistive device with comparison to utilizing walker.  FUNCTIONAL TESTS:  5  times sit to stand: 23 sec Timed up and go (TUG): 20.55 sec 10 meter walk test: 14.35 sec (.69 m/s) 2WW, 17.43 sec ( .56 m/s) SPC Berg Balance Scale: 43 6 Minute Walk Test:    PATIENT SURVEYS:  Stroke Impact Scale 16 46/80                                                                                                                              TREATMENT DATE: 03/12/24   PHYSICAL PERFORMANCE   PT instructed pt in TUG: 11.73 sec ( >13.5 sec indicates increased fall risk)  Five times Sit to Stand Test (FTSS)  TIME: 16.82 sec  Cut off scores indicative of increased fall risk: >12 sec CVA, >16 sec PD, >13 sec vestibular (ANPTA Core Set of Outcome Measures for Adults with Neurologic Conditions, 2018)  6 Min Walk Test:  Instructed patient to ambulate as quickly and as safely as possible for 6 minutes using LRAD. Patient was allowed to take standing rest breaks without stopping the test, but if the patient required a sitting rest break the clock would be stopped and the test would be over.  Results: 467 ft  stops in 3:10 sec, SPO2 remains  98% but pt RR clearly increased significantl. Results indicate that the patient has reduced endurance with ambulation compared to age matched norms.  Age Matched Norms (in meters): 70-69 yo M: 19 F: 60, 55-79 yo M: 82 F: 471, 2-89 yo M: 417 F: 392 MDC: 58.21 meters (190.98 feet) or 50 meters (ANPTA Core Set of Outcome Measures for Adults with Neurologic Conditions, 2018)  10 Meter Walk Test: Patient instructed to walk 10 meters (32.8 ft) as quickly and as safely as possible at their normal speed Results: .93 m/s   Cut off scores:   Household Ambulator  < 0.4 m/s  Limited Community Ambulator  0.4 - 0.8 m/s  [employer]  > 0.8 m/s  Increased fall risk  < 1.84m/s  Crossing a Street  >1.71m/s  MCID 0.05 m/s (small), 0.13 m/s (moderate), 0.06 m/s (significant)  (ANPTA Core Set of Outcome Measures for Adults with Neurologic Conditions, 2018)     Patient demonstrates increased fall risk as noted by score of  54 /56 on Berg Balance Scale.  (<36= high risk for falls, close to 100%; 37-45 significant >80%; 46-51 moderate >50%; 52-55 lower >25%)   [facility] PT Assessment - 03/12/24 0001       Berg Balance Test   Sit to Stand Able to stand without using hands and stabilize independently    Standing Unsupported Able to stand safely 2 minutes    Sitting with Back Unsupported but Feet Supported on Floor or Stool Able to sit safely and securely 2 minutes    Stand to Sit Sits safely with minimal use of hands    Transfers Able to transfer safely, minor use of hands    Standing Unsupported with Eyes Closed Able  to stand 10 seconds safely    Standing Unsupported with Feet Together Able to place feet together independently and stand 1 minute safely    From Standing, Reach Forward with Outstretched Arm Can reach confidently >25 cm (10")    From Standing Position, Pick up Object from Floor Able to pick up shoe safely and easily    From Standing Position, Turn to Look Behind Over each Shoulder  Looks behind from both sides and weight shifts well    Turn 360 Degrees Able to turn 360 degrees safely in 4 seconds or less    Standing Unsupported, Alternately Place Feet on Step/Stool Able to stand independently and safely and complete 8 steps in 20 seconds    Standing Unsupported, One Foot in Front Able to place foot tandem independently and hold 30 seconds    Standing on One Leg Able to lift leg independently and hold equal to or more than 3 seconds    Total Score 54                   PATIENT EDUCATION: Education details: POC Person educated: Patient Education method: Explanation Education comprehension: verbalized understanding   HOME EXERCISE PROGRAM:  Access Code: [email or web address] URL: [email or web address] Date: 02/06/2024 Prepared by: Carlen Chasten  Exercises - Sit to Stand Without Arm Support  - 1 x daily - 7 x weekly - 3 sets - 10 reps - Standing March with Counter Support  - 1 x daily - 7 x weekly - 3 sets - 20 reps - Heel Raises with Counter Support  - 1 x daily - 7 x weekly - 3 sets - 10 reps - Standing Hamstring Curl with RW  - 1 x daily - 7 x weekly - 3 sets - 10 reps - Backward Walking with Counter Support  - 1 x daily - 7 x weekly - 3 sets - 10 reps   GOALS: Goals reviewed with patient? Yes  SHORT TERM GOALS: Target date: 02/27/2024   Patient will be independent in home exercise program to improve strength/mobility for better functional independence with ADLs. Baseline: No HEP currently 5/27: doing them regularly  Goal status: MET   LONG TERM GOALS: Target date: 04/23/2024    1.  Patient (> 59 years old) will complete five times sit to stand test in < 15 seconds indicating an increased LE strength and improved balance. Baseline: 23 sec 5/27:16.82 sec  Goal status: ONGOING  2.  Patient will improve SIS-16 score to 60   to demonstrate statistically significant improvement in mobility and quality of life as it relates to their mobility and  QOL as a result of their CVA.  Baseline: 46  5/27: 67 Goal status: MET   3.  Patient will increase Berg Balance score by > 6 points to demonstrate decreased fall risk during functional activities. Baseline: 43 5/27: 54 Goal status: MET   4.   Patient will reduce timed up and go to <11 seconds  to reduce fall risk and demonstrate improved transfer/gait ability. Baseline: 20.55 sec no AD 5/27:11.73 sec Goal status: NEARLY MET  5.   Patient will increase 10 meter walk test to >1.81m/s as to improve gait speed for better community ambulation and to reduce fall risk. Baseline: 14.35 sec (.69 m/s) 2WW, 17.43 sec ( .56 m/s) SPC 5/27: .93 m/s  Goal status: NEARLY MET  6.   Patient will increase six minute walk test distance to >1000 for progression to community ambulator and improve gait  ability Baseline: 02/01/2024: 886ft unsure of AD usage 5/27: 467 ft  stops in 3:10 sec, SPO2 remains 98% but pt RR clearly increased significantly  Goal status: NOT MET     ASSESSMENT:  CLINICAL IMPRESSION:  Patient presents to physical therapy for progress note to this date.  Patient shows progress towards nearly all long-term goals showing significantly decreased risk factors for falls as evidenced by Randye Buttner balance scale, 10 m walk test, 5 times sit to stand, and timed up and go.  Patient's main limitation is now endurance as evidenced by significant limitations with 6-minute walk test.  Patient has received referral for cardiac rehab and is instructed this is the best place for him to continue his journey of improving his functional mobility.  Patient and caregiver verbalized understanding importance for continuing with updated HEP to continue to work toward improving his strength and quality of life.  Patient will be discharged from formal physical therapy this date with intention of starting cardiac rehab at earliest convenience.   OBJECTIVE IMPAIRMENTS: Abnormal gait, decreased activity tolerance,  decreased balance, decreased endurance, decreased mobility, difficulty walking, decreased strength, and decreased safety awareness.   ACTIVITY LIMITATIONS: carrying, standing, squatting, and stairs  PARTICIPATION LIMITATIONS: meal prep, driving, community activity, and yard work  PERSONAL FACTORS: Age and 3+ comorbidities: hypertension migraine headaches hyperlipidemia, CKD stage III, prostate cancer, left total hip arthroplasty 09/29/2015, CAD/cardiomyopathy left bundle branch block are also affecting patient's functional outcome.   REHAB POTENTIAL: Good  CLINICAL DECISION MAKING: Evolving/moderate complexity  EVALUATION COMPLEXITY: Moderate  PLAN:  PT FREQUENCY: 2x/week  PT DURATION: 12 weeks  PLANNED INTERVENTIONS: 97110-Therapeutic exercises, 97530- Therapeutic activity, 97112- Neuromuscular re-education, 97535- Self Care, 16109- Manual therapy, [phone]- Gait training, Patient/Family education, Balance training, and Stair training  PLAN FOR NEXT SESSION:  D/C today     Edwina Gram PT ,DPT Physical Therapist- [place]  [facility]   3:39 PM 03/12/24

## 2024-03-12 NOTE — Telephone Encounter (Signed)
 Pt inquired as to whether he is cleared to drive. Per Verdie Gladden, pt is cleared from a cardiology perspective but would need clearance from neurologist. Pt verbalized understanding and stated he has an upcoming appointment with neurologist soon.

## 2024-03-12 NOTE — Patient Instructions (Signed)
 Medication Instructions:  Your physician recommends the following medication changes.  STOP TAKING: Amiodarone  Metoprolol  tartrate (Lopressor )  START TAKING: Metoprolol  Succinate (Toprol ) 25 mg by mouth daily Nexletol 180 mg by mouth daily  *If you need a refill on your cardiac medications before your next appointment, please call your pharmacy*  Lab Work: No labs ordered today   Testing/Procedures: Your physician has requested that you have an echocardiogram in 2 months. Echocardiography is a painless test that uses sound waves to create images of your heart. It provides your doctor with information about the size and shape of your heart and how well your heart's chambers and valves are working.   You may receive an ultrasound enhancing agent through an IV if needed to better visualize your heart during the echo. This procedure takes approximately one hour.  There are no restrictions for this procedure.  This will take place at [address] [facility]) [address], [place] [address]  Please note: We ask at that you not bring children with you during ultrasound (echo/ vascular) testing. Due to room size and safety concerns, children are not allowed in the ultrasound rooms during exams. Our front office staff cannot provide observation of children in our lobby area while testing is being conducted. An adult accompanying a patient to their appointment will only be allowed in the ultrasound room at the discretion of the ultrasound technician under special circumstances. We apologize for any inconvenience.   Follow-Up: At [facility], you and your health needs are our priority.  As part of our continuing mission to provide you with exceptional heart care, our providers are all part of one team.  This team includes your primary Cardiologist (physician) and Advanced Practice Providers or APPs (Physician Assistants and Nurse Practitioners) who all work together to provide  you with the care you need, when you need it.  Your next appointment:   3 month(s)  Provider:   Varney Gentleman, PA-C

## 2024-03-12 NOTE — Telephone Encounter (Signed)
 Spoke with Cardiac Rehab, who reported that the patient's referral is ready for scheduling; however, they are awaiting the patient's completion of physical therapy due to safety concerns. She noted that Cardiac Rehab requires the patient to have sufficient stability, as it is a more independent setting.  Will forward to [facility] for awareness

## 2024-03-12 NOTE — Telephone Encounter (Signed)
 Heidi Llamas for Cardiac Rehab made aware   Roark Chick, PA-C  [employer] now (1:43 PM)    Patient's last PT appointment is this afternoon.  Please notify cardiac rehab.

## 2024-03-14 ENCOUNTER — Encounter: Payer: Self-pay | Admitting: Neurology

## 2024-03-14 ENCOUNTER — Ambulatory Visit: Payer: Self-pay | Admitting: Neurology

## 2024-03-14 VITALS — BP 159/84 | HR 87 | Ht 68.0 in | Wt 195.5 lb

## 2024-03-14 DIAGNOSIS — R4789 Other speech disturbances: Secondary | ICD-10-CM | POA: Diagnosis not present

## 2024-03-14 DIAGNOSIS — I639 Cerebral infarction, unspecified: Secondary | ICD-10-CM | POA: Diagnosis not present

## 2024-03-14 NOTE — Progress Notes (Signed)
 Chief Complaint  Patient presents with   Hospitalization Follow-up    RM 14. Hospital Follow Up- Saw Dr. Janett Medin - Stroke: Pt reports doing fine since strokes. Reports lossing use of left arm and right leg for a coupe hours but quickly gained movement back. Reports movement in foot is about 95% back - feels his foot may be having some spasms. Reports falling due to spasm in his foot May 12 and getting concussion. Went to ER on 5/14.       ASSESSMENT AND PLAN  Tyler David is a 78 y.o. 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   MEDICAL HISTORY:  Tyler David, is a 78 year old male accompanied by his wife, seen in request by his primary care from Manuelita Sellar, Sammie Crigler to follow-up on his embolic stroke, initial evaluation was on Mar 14, 2024  History is obtained from the patient and review of electronic medical records. I personally reviewed pertinent available imaging films in PACS.   PMHx of  HLD HTN Hypothyrodism Prostate Cancer,  CKD CAD, s/p CABG on January 04 2024 Left hip replacement,   Patient had a CABG x 3 on January 04, 2024, postoperatively, he was found to have right leg weakness,  CT and later MRI of the brain confirmed multiple embolic stroke throughout by hemisphere, left greater than right cerebellum, MRA of the brain showed no large vessel disease, moderate to severe stenosis at the right supraclinoid ICA   Echocardiogram, ejection  fraction 40%, hypokinesia of inferior and inferior septal  Ultrasound of carotid artery: Right IC less than 39% stenosis, Left IC patent.  He was found to have transient atrial fibrillation, was discharged with aspirin  81+ Eliquis  5 mg twice daily, subsequent Zio patch showed predominant rhythm of sinus, with no evidence of atrial fibrillation  He suffered a mechanical fall middle of May 2025, was evaluated at [facility] , CT head showed no acute abnormality  He was seen by cardiologist on Mar 12, 2024, decided to continue on Eliquis  and aspirin  81 mg, amiodarone  was stopped  He recovered well from his focal deficit, almost back to baseline, tends to be sedentary, like to work on his [employer]  He has mild word finding difficulties, retired as a Medical illustrator for [employer]  [facility] examination today 26/30, missed 4 of 5 recalls  LDL 112, on Zetia  10 mg, A1c 5.4  PHYSICAL EXAM:   Vitals:   03/14/24 1305  BP: (!) 159/84  Pulse: 87  Weight: 195 lb 8 oz (88.7 kg)  Height: 5\' 8"  (1.727 m)   Body mass index is 29.73 kg/m.  PHYSICAL EXAMNIATION:  Gen: NAD, conversant, well nourised, well groomed                     Cardiovascular: Regular rate rhythm, no peripheral edema, warm, nontender. Eyes: Conjunctivae clear without exudates or hemorrhage Neck: Supple, no carotid bruits. Pulmonary: Clear to auscultation bilaterally   NEUROLOGICAL EXAM:  MENTAL  STATUS: Speech/cognition: Awake, alert, oriented to history taking and casual conversation    03/14/2024    1:00 PM  Montreal Cognitive Assessment   Visuospatial/ Executive (0/5) 5  Naming (0/3) 3  Attention: Read list of digits (0/2) 2  Attention: Read list of letters (0/1) 1  Attention: Serial 7 subtraction starting at 100 (0/3) 3  Language: Repeat phrase (0/2) 2  Language : Fluency (0/1) 1  Abstraction (0/2) 2  Delayed Recall (0/5) 1  Orientation (0/6) 6  Total 26    CRANIAL NERVES: CN II: Visual  fields are full to confrontation. Pupils are round equal and briskly reactive to light. CN III, IV, VI: extraocular movement are normal. No ptosis. CN V: Facial sensation is intact to light touch CN VII: Face is symmetric with normal eye closure  CN VIII: Hearing is normal to causal conversation. CN IX, X: Phonation is normal. CN XI: Head turning and shoulder shrug are intact  MOTOR: There is no pronator drift of out-stretched arms. Muscle bulk and tone are normal. Muscle strength is normal.  REFLEXES: Reflexes are 1 and symmetric at the biceps, triceps, knees, and ankles. Plantar responses are flexor.  SENSORY: Intact to light touch, pinprick and vibratory sensation are intact in fingers and toes.  COORDINATION: There is no trunk or limb dysmetria noted.  GAIT/STANCE: Posture is normal. Gait is steady   REVIEW OF SYSTEMS:  Full 14 system review of systems performed and notable only for as above All other review of systems were negative.   ALLERGIES: Allergies  Allergen Reactions   Lipitor [Atorvastatin] Other (See Comments)    Myalgias    Pravachol [Pravastatin] Other (See Comments)    Myalgias    Statins Other (See Comments)    Myalgias    Zocor [Simvastatin] Other (See Comments)    Myalgias    Penicillins Rash    Other Reaction(s): HIVES, Unknown    HOME MEDICATIONS: Current Outpatient Medications  Medication Sig Dispense Refill   amLODipine  (NORVASC ) 5 MG tablet Take 1 tablet (5 mg total) by mouth daily. 90 tablet 3   apixaban  (ELIQUIS ) 5 MG TABS tablet Take 1 tablet (5 mg total) by mouth 2 (two) times daily. 180 tablet 3   aspirin  EC 81 MG tablet Take 1 tablet (81 mg total) by mouth daily. Swallow whole.     Bempedoic Acid (NEXLETOL) 180 MG TABS Take 1 tablet (180 mg total) by mouth daily. 90 tablet 3   cholecalciferol  (VITAMIN D3) 25 MCG (1000 UNIT) tablet Take 1 tablet (1,000 Units total) by mouth daily. 30 tablet 0   docusate sodium  (COLACE) 100 MG capsule  Take 2 capsules (200 mg total) by mouth daily.     ezetimibe  (ZETIA ) 10 MG tablet Take 1 tablet (10 mg total) by mouth daily. 90 tablet 3   famotidine  (PEPCID ) 20 MG tablet Take 1 tablet (20 mg total) by mouth every evening. 30 tablet 0   fluticasone  (FLONASE ) 50 MCG/ACT nasal spray Place into both nostrils daily.     furosemide  (LASIX ) 40 MG tablet Take 1 tablet (40 mg total) by mouth daily as needed (for increased shortness of breath, lower extremity swelling, or weight gain greater than 3 pounds overnight or 5 pounds in a 7-day time span). 30 tablet 6   levothyroxine  (SYNTHROID ) 50 MCG tablet Take 1 tablet (50 mcg total) by mouth daily before breakfast. 30 tablet 0   MAGNESIUM  PO Take 1 tablet by mouth daily.     melatonin 5 MG  TABS Take 1 tablet (5 mg total) by mouth at bedtime. 30 tablet 0   metoprolol  succinate (TOPROL -XL) 25 MG 24 hr tablet Take 1 tablet (25 mg total) by mouth daily. Take with or immediately following a meal. 90 tablet 3   Multiple Vitamins-Minerals (ICAPS AREDS 2 PO) Take 1 capsule by mouth 2 (two) times daily.     nitroGLYCERIN  (NITROSTAT ) 0.4 MG SL tablet Place 1 tablet (0.4 mg total) under the tongue every 5 (five) minutes as needed for chest pain. 25 tablet 0   bisacodyl  (DULCOLAX) 5 MG EC tablet Take 2 tablets (10 mg total) by mouth daily. (Patient not taking: Reported on 03/14/2024)     diclofenac  Sodium (VOLTAREN ) 1 % GEL Apply 2 g topically 4 (four) times daily. (Patient not taking: Reported on 03/14/2024) 100 g 0   loratadine  (CLARITIN ) 10 MG tablet Take 1 tablet (10 mg total) by mouth daily. (Patient not taking: Reported on 03/14/2024) 30 tablet 0   Omega-3 Fatty Acids (FISH OIL) 1000 MG CAPS Take 1,000 mg by mouth daily. (Patient not taking: Reported on 02/19/2024)     Turmeric 500 MG CAPS Take 500 mg by mouth 2 (two) times daily. (Patient not taking: Reported on 02/19/2024)     No current facility-administered medications for this visit.    PAST MEDICAL  HISTORY: Past Medical History:  Diagnosis Date   Arthritis    Cancer (HCC)    prostate cancer    Cardiomyopathy (HCC)    Chronic kidney disease, stage 3 (HCC)    Coronary artery disease    ED (erectile dysfunction)    GERD (gastroesophageal reflux disease)    Headache    hx of migraine-2015    Hyperlipidemia    Hypertension    Hypothyroidism    LBBB (left bundle branch block)    Migraine    Nocturia    Overactive bladder    Pneumonia    hx of at age 36     PAST SURGICAL HISTORY: Past Surgical History:  Procedure Laterality Date   CARPAL TUNNEL RELEASE Bilateral    CORONARY ARTERY BYPASS GRAFT N/A 01/04/2024   Procedure: CORONARY ARTERY BYPASS GRAFTING TIMES THREE USING LEFT INTERNAL MAMMARY ARTERY AND RIGHT GREAT SAPHENOUS VEIN HARVESTED ENDOSCOPICALLY;  Surgeon: Hilarie Lovely, MD;  Location: [facility] OR;  Service: Open Heart Surgery;  Laterality: N/A;   INTRAOPERATIVE TRANSESOPHAGEAL ECHOCARDIOGRAM N/A 01/04/2024   Procedure: ECHOCARDIOGRAM, TRANSESOPHAGEAL, INTRAOPERATIVE;  Surgeon: Hilarie Lovely, MD;  Location: [facility] OR;  Service: Open Heart Surgery;  Laterality: N/A;   PROSTATE SURGERY     RIGHT/LEFT HEART CATH AND CORONARY ANGIOGRAPHY Bilateral 12/15/2023   Procedure: RIGHT/LEFT HEART CATH AND CORONARY ANGIOGRAPHY;  Surgeon: Sammy Crisp, MD;  Location: [facility] INVASIVE CV LAB;  Service: Cardiovascular;  Laterality: Bilateral;   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   Procedure: LEFT TOTAL HIP ARTHROPLASTY ANTERIOR APPROACH;  Surgeon: Claiborne Crew, MD;  Location: [facility] ORS;  Service: Orthopedics;  Laterality: Left;    FAMILY HISTORY: Family History  Problem Relation Age of Onset   Ovarian cancer Mother    Heart failure Father    COPD Father    Atrial fibrillation Brother    Prostate cancer Brother     SOCIAL HISTORY: Social History   Socioeconomic History   Marital status: Married    Spouse name: Not on file   Number of children: 2   Years of education:  Not on file   Highest education level: Not on file  Occupational History  Not on file  Tobacco Use   Smoking status: Never   Smokeless tobacco: Never  Vaping Use   Vaping status: Never Used  Substance and Sexual Activity   Alcohol use: Not Currently   Drug use: No   Sexual activity: Not on file  Other Topics Concern   Not on file  Social History Narrative   Lives with Maury, wife, and indoor pet-dog.    Social Drivers of Corporate investment banker Strain: Not on file  Food Insecurity: No Food Insecurity (01/04/2024)   Hunger Vital Sign    Worried About Running Out of Food in the Last Year: Never true    Ran Out of Food in the Last Year: Never true  Transportation Needs: No Transportation Needs (01/04/2024)   PRAPARE - Administrator, Civil Service (Medical): No    Lack of Transportation (Non-Medical): No  Physical Activity: Not on file  Stress: Not on file  Social Connections: Socially Integrated (01/04/2024)   Social Connection and Isolation Panel [NHANES]    Frequency of Communication with Friends and Family: More than three times a week    Frequency of Social Gatherings with Friends and Family: Three times a week    Attends Religious Services: More than 4 times per year    Active Member of Clubs or Organizations: Yes    Attends Banker Meetings: More than 4 times per year    Marital Status: Married  Catering manager Violence: Not At Risk (01/04/2024)   Humiliation, Afraid, Rape, and Kick questionnaire    Fear of Current or Ex-Partner: No    Emotionally Abused: No    Physically Abused: No    Sexually Abused: No      Phebe Brasil, M.D. Ph.D.  [facility] [address], [address] [place], [place] [address] Ph: [phone] Fax: [phone]  CC:  Sterling Eisenmenger, PA-C [address] [place],  [place] [address]  Roselind Congo, MD

## 2024-03-19 ENCOUNTER — Encounter: Payer: Self-pay | Admitting: *Deleted

## 2024-03-19 ENCOUNTER — Encounter: Attending: Internal Medicine | Admitting: *Deleted

## 2024-03-19 ENCOUNTER — Ambulatory Visit: Admitting: Physical Therapy

## 2024-03-19 DIAGNOSIS — Z48812 Encounter for surgical aftercare following surgery on the circulatory system: Secondary | ICD-10-CM | POA: Insufficient documentation

## 2024-03-19 DIAGNOSIS — Z951 Presence of aortocoronary bypass graft: Secondary | ICD-10-CM | POA: Insufficient documentation

## 2024-03-19 NOTE — Progress Notes (Signed)
 Virtual orientation call completed today. he has an appointment on Date: 03/20/2024 for EP eval and gym Orientation.  Documentation of diagnosis can be found in [facility] 01/04/2024 .

## 2024-03-20 ENCOUNTER — Encounter: Admitting: Physical Medicine and Rehabilitation

## 2024-03-20 ENCOUNTER — Encounter

## 2024-03-20 ENCOUNTER — Encounter: Payer: Self-pay | Admitting: *Deleted

## 2024-03-20 VITALS — Ht 67.64 in | Wt 194.6 lb

## 2024-03-20 DIAGNOSIS — Z951 Presence of aortocoronary bypass graft: Secondary | ICD-10-CM | POA: Diagnosis not present

## 2024-03-20 DIAGNOSIS — Z48812 Encounter for surgical aftercare following surgery on the circulatory system: Secondary | ICD-10-CM | POA: Diagnosis not present

## 2024-03-20 NOTE — Progress Notes (Signed)
 For EP eval today

## 2024-03-20 NOTE — Patient Instructions (Addendum)
 Patient Instructions  Patient Details  Name: Tyler David MRN: [id number] Date of Birth: 1945/10/27 Referring Provider:  Sammy Crisp, MD  Below are your personal goals for exercise, nutrition, and risk factors. Our goal is to help you stay on track towards obtaining and maintaining these goals. We will be discussing your progress on these goals with you throughout the program.  Initial Exercise Prescription:  Initial Exercise Prescription - 03/20/24 1000       Date of Initial Exercise RX and Referring Provider   Date 03/20/24    Referring Provider End, Veryl Gottron, MD      Oxygen   Maintain Oxygen Saturation 88% or higher      Recumbant Bike   Level 2    RPM 50    Watts 25    Minutes 15    METs 2.32      NuStep   Level 2    SPM 80    Minutes 15    METs 2.32      Biostep-RELP   Level 2    SPM 50    Minutes 15    METs 2.32      Track   Laps 20    Minutes 15    METs 2.09      Prescription Details   Frequency (times per week) 3    Duration Progress to 30 minutes of continuous aerobic without signs/symptoms of physical distress      Intensity   THRR 40-80% of Max Heartrate 95-126    Ratings of Perceived Exertion 11-13    Perceived Dyspnea 0-4      Progression   Progression Continue to progress workloads to maintain intensity without signs/symptoms of physical distress.      Resistance Training   Training Prescription Yes    Weight 5 lb    Reps 10-15             Exercise Goals: Frequency: Be able to perform aerobic exercise two to three times per week in program working toward 2-5 days per week of home exercise.  Intensity: Work with a perceived exertion of 11 (fairly light) - 15 (hard) while following your exercise prescription.  We will make changes to your prescription with you as you progress through the program.   Duration: Be able to do 30 to 45 minutes of continuous aerobic exercise in addition to a 5 minute warm-up and a 5 minute  cool-down routine.   Nutrition Goals: Your personal nutrition goals will be established when you do your nutrition analysis with the dietician.  The following are general nutrition guidelines to follow: Cholesterol < 200mg /day Sodium < 1500mg /day Fiber: Men over 50 yrs - 30 grams per day  Personal Goals:  Personal Goals and Risk Factors at Admission - 03/20/24 1047       Core Components/Risk Factors/Patient Goals on Admission    Weight Management Yes;Weight Loss    Intervention Weight Management: Develop a combined nutrition and exercise program designed to reach desired caloric intake, while maintaining appropriate intake of nutrient and fiber, sodium and fats, and appropriate energy expenditure required for the weight goal.;Weight Management: Provide education and appropriate resources to help participant work on and attain dietary goals.;Weight Management/Obesity: Establish reasonable short term and long term weight goals.    Admit Weight 194 lb 9.6 oz (88.3 kg)    Goal Weight: Short Term 189 lb (85.7 kg)    Goal Weight: Long Term 160 lb (72.6 kg)    Expected Outcomes Short  Term: Continue to assess and modify interventions until short term weight is achieved;Long Term: Adherence to nutrition and physical activity/exercise program aimed toward attainment of established weight goal;Weight Loss: Understanding of general recommendations for a balanced deficit meal plan, which promotes 1-2 lb weight loss per week and includes a negative energy balance of [phone] kcal/d;Understanding recommendations for meals to include 15-35% energy as protein, 25-35% energy from fat, 35-60% energy from carbohydrates, less than 200mg  of dietary cholesterol, 20-35 gm of total fiber daily;Understanding of distribution of calorie intake throughout the day with the consumption of 4-5 meals/snacks    Hypertension Yes    Intervention Provide education on lifestyle modifcations including regular physical  activity/exercise, weight management, moderate sodium restriction and increased consumption of fresh fruit, vegetables, and low fat dairy, alcohol moderation, and smoking cessation.;Monitor prescription use compliance.    Expected Outcomes Short Term: Continued assessment and intervention until BP is < 140/26mm HG in hypertensive participants. < 130/100mm HG in hypertensive participants with diabetes, heart failure or chronic kidney disease.;Long Term: Maintenance of blood pressure at goal levels.    Lipids Yes    Intervention Provide education and support for participant on nutrition & aerobic/resistive exercise along with prescribed medications to achieve LDL 70mg , HDL >40mg .    Expected Outcomes Short Term: Participant states understanding of desired cholesterol values and is compliant with medications prescribed. Participant is following exercise prescription and nutrition guidelines.;Long Term: Cholesterol controlled with medications as prescribed, with individualized exercise RX and with personalized nutrition plan. Value goals: LDL < 70mg , HDL > 40 mg.             Tobacco Use Initial Evaluation: Social History   Tobacco Use  Smoking Status Never  Smokeless Tobacco Never    Exercise Goals and Review:  Exercise Goals     Row Name 03/20/24 1037             Exercise Goals   Increase Physical Activity Yes       Intervention Provide advice, education, support and counseling about physical activity/exercise needs.;Develop an individualized exercise prescription for aerobic and resistive training based on initial evaluation findings, risk stratification, comorbidities and participant's personal goals.       Expected Outcomes Short Term: Attend rehab on a regular basis to increase amount of physical activity.;Long Term: Add in home exercise to make exercise part of routine and to increase amount of physical activity.;Long Term: Exercising regularly at least 3-5 days a week.        Increase Strength and Stamina Yes       Intervention Provide advice, education, support and counseling about physical activity/exercise needs.;Develop an individualized exercise prescription for aerobic and resistive training based on initial evaluation findings, risk stratification, comorbidities and participant's personal goals.       Expected Outcomes Short Term: Increase workloads from initial exercise prescription for resistance, speed, and METs.;Short Term: Perform resistance training exercises routinely during rehab and add in resistance training at home;Long Term: Improve cardiorespiratory fitness, muscular endurance and strength as measured by increased METs and functional capacity ( )       Able to understand and use rate of perceived exertion (RPE) scale Yes       Intervention Provide education and explanation on how to use RPE scale       Expected Outcomes Short Term: Able to use RPE daily in rehab to express subjective intensity level;Long Term:  Able to use RPE to guide intensity level when exercising independently  Able to understand and use Dyspnea scale Yes       Intervention Provide education and explanation on how to use Dyspnea scale       Expected Outcomes Short Term: Able to use Dyspnea scale daily in rehab to express subjective sense of shortness of breath during exertion;Long Term: Able to use Dyspnea scale to guide intensity level when exercising independently       Knowledge and understanding of Target Heart Rate Range (THRR) Yes       Intervention Provide education and explanation of THRR including how the numbers were predicted and where they are located for reference       Expected Outcomes Short Term: Able to state/look up THRR;Short Term: Able to use daily as guideline for intensity in rehab;Long Term: Able to use THRR to govern intensity when exercising independently       Able to check pulse independently Yes       Intervention Provide education and demonstration  on how to check pulse in carotid and radial arteries.;Review the importance of being able to check your own pulse for safety during independent exercise       Expected Outcomes Short Term: Able to explain why pulse checking is important during independent exercise;Long Term: Able to check pulse independently and accurately       Understanding of Exercise Prescription Yes       Intervention Provide education, explanation, and written materials on patient's individual exercise prescription       Expected Outcomes Short Term: Able to explain program exercise prescription;Long Term: Able to explain home exercise prescription to exercise independently

## 2024-03-20 NOTE — Progress Notes (Signed)
 Cardiac Individual Treatment Plan  Patient Details  Name: Tyler David MRN: [id number] Date of Birth: 1946/04/02 Referring Provider:   Flowsheet Row Cardiac Rehab from 03/20/2024 in [facility] Cardiac and Pulmonary Rehab  Referring Provider End, Veryl Gottron, MD       Initial Encounter Date:  Flowsheet Row Cardiac Rehab from 03/20/2024 in [facility] Cardiac and Pulmonary Rehab  Date 03/20/24       Visit Diagnosis: S/P CABG x 3  Patient's Home Medications on Admission:  Current Outpatient Medications:    amLODipine  (NORVASC ) 5 MG tablet, Take 1 tablet (5 mg total) by mouth daily., Disp: 90 tablet, Rfl: 3   apixaban  (ELIQUIS ) 5 MG TABS tablet, Take 1 tablet (5 mg total) by mouth 2 (two) times daily., Disp: 180 tablet, Rfl: 3   aspirin  EC 81 MG tablet, Take 1 tablet (81 mg total) by mouth daily. Swallow whole., Disp: , Rfl:    Bempedoic Acid  (NEXLETOL ) 180 MG TABS, Take 1 tablet (180 mg total) by mouth daily., Disp: 90 tablet, Rfl: 3   bisacodyl  (DULCOLAX) 5 MG EC tablet, Take 2 tablets (10 mg total) by mouth daily. (Patient not taking: Reported on 03/14/2024), Disp: , Rfl:    cholecalciferol  (VITAMIN D3) 25 MCG (1000 UNIT) tablet, Take 1 tablet (1,000 Units total) by mouth daily., Disp: 30 tablet, Rfl: 0   diclofenac  Sodium (VOLTAREN ) 1 % GEL, Apply 2 g topically 4 (four) times daily. (Patient not taking: Reported on 03/14/2024), Disp: 100 g, Rfl: 0   docusate sodium  (COLACE) 100 MG capsule, Take 2 capsules (200 mg total) by mouth daily., Disp: , Rfl:    ezetimibe  (ZETIA ) 10 MG tablet, Take 1 tablet (10 mg total) by mouth daily., Disp: 90 tablet, Rfl: 3   famotidine  (PEPCID ) 20 MG tablet, Take 1 tablet (20 mg total) by mouth every evening., Disp: 30 tablet, Rfl: 0   fluticasone  (FLONASE ) 50 MCG/ACT nasal spray, Place into both nostrils daily., Disp: , Rfl:    furosemide  (LASIX ) 40 MG tablet, Take 1 tablet (40 mg total) by mouth daily as needed (for increased shortness of breath, lower extremity  swelling, or weight gain greater than 3 pounds overnight or 5 pounds in a 7-day time span)., Disp: 30 tablet, Rfl: 6   levothyroxine  (SYNTHROID ) 50 MCG tablet, Take 1 tablet (50 mcg total) by mouth daily before breakfast., Disp: 30 tablet, Rfl: 0   loratadine  (CLARITIN ) 10 MG tablet, Take 1 tablet (10 mg total) by mouth daily. (Patient not taking: Reported on 03/14/2024), Disp: 30 tablet, Rfl: 0   MAGNESIUM  PO, Take 1 tablet by mouth daily., Disp: , Rfl:    melatonin 5 MG TABS, Take 1 tablet (5 mg total) by mouth at bedtime., Disp: 30 tablet, Rfl: 0   metoprolol  succinate (TOPROL -XL) 25 MG 24 hr tablet, Take 1 tablet (25 mg total) by mouth daily. Take with or immediately following a meal., Disp: 90 tablet, Rfl: 3   Multiple Vitamins-Minerals (ICAPS AREDS 2 PO), Take 1 capsule by mouth 2 (two) times daily., Disp: , Rfl:    nitroGLYCERIN  (NITROSTAT ) 0.4 MG SL tablet, Place 1 tablet (0.4 mg total) under the tongue every 5 (five) minutes as needed for chest pain., Disp: 25 tablet, Rfl: 0   Omega-3 Fatty Acids (FISH OIL) 1000 MG CAPS, Take 1,000 mg by mouth daily. (Patient not taking: Reported on 02/19/2024), Disp: , Rfl:    Turmeric 500 MG CAPS, Take 500 mg by mouth 2 (two) times daily. (Patient not taking: Reported on 02/19/2024),  Disp: , Rfl:   Past Medical History: Past Medical History:  Diagnosis Date   Arthritis    Cancer (HCC)    prostate cancer    Cardiomyopathy (HCC)    Chronic kidney disease, stage 3 (HCC)    Coronary artery disease    ED (erectile dysfunction)    GERD (gastroesophageal reflux disease)    Headache    hx of migraine-2015    Hyperlipidemia    Hypertension    Hypothyroidism    LBBB (left bundle branch block)    Migraine    Nocturia    Overactive bladder    Pneumonia    hx of at age 78     Tobacco Use: Social History   Tobacco Use  Smoking Status Never  Smokeless Tobacco Never    Labs: Review Flowsheet  More data exists      Latest Ref Rng & Units 12/15/2023  01/02/2024 01/04/2024 01/05/2024 01/07/2024  Labs for ITP Cardiac and Pulmonary Rehab  Cholestrol 0 - 200 mg/dL - - - - 782   LDL (calc) 0 - 99 mg/dL - - - - 956   HDL-C >21 mg/dL - - - - 26   Trlycerides <150 mg/dL - - - - 308   Hemoglobin A1c 4.8 - 5.6 % - 5.4  - - -  PH, Arterial 7.35 - 7.45 7.347  - 7.306  7.337  7.308  7.348  7.355  7.372  7.345  7.302  - -  PCO2 arterial 32 - 48 mmHg 41.8  - 34.8  41.4  33.2  38.3  36.2  39.3  38.8  44.6  - -  Bicarbonate 20.0 - 28.0 mmol/L 22.9  24.3  - 17.3  22.1  16.7  21.2  20.2  22.8  21.8  21.2  22.1  - -  TCO2 22 - 32 mmol/L 24  26  - 18  23  18  22  21  21  24  24  23  22  23  22  23   - -  Acid-base deficit 0.0 - 2.0 mmol/L 3.0  2.0  - 8.0  3.0  9.0  4.0  5.0  2.0  4.0  4.0  4.0  - -  O2 Saturation % 91  66  - 92  92  91  92  100  100  87  100  100  75.7  -    Details       Multiple values from one day are sorted in reverse-chronological order          Exercise Target Goals: Exercise Program Goal: Individual exercise prescription set using results from initial 6 min walk test and THRR while considering  patient's activity barriers and safety.   Exercise Prescription Goal: Initial exercise prescription builds to 30-45 minutes a day of aerobic activity, 2-3 days per week.  Home exercise guidelines will be given to patient during program as part of exercise prescription that the participant will acknowledge.   Education: Aerobic Exercise: - Group verbal and visual presentation on the components of exercise prescription. Introduces F.I.T.T principle from ACSM for exercise prescriptions.  Reviews F.I.T.T. principles of aerobic exercise including progression. Written material given at graduation.   Education: Resistance Exercise: - Group verbal and visual presentation on the components of exercise prescription. Introduces F.I.T.T principle from ACSM for exercise prescriptions  Reviews F.I.T.T. principles of resistance exercise including  progression. Written material given at graduation.    Education: Exercise & Equipment Safety: -  Individual verbal instruction and demonstration of equipment use and safety with use of the equipment. Flowsheet Row Cardiac Rehab from 03/20/2024 in [facility] Cardiac and Pulmonary Rehab  Date 03/20/24  Educator MB  Instruction Review Code 1- Verbalizes Understanding       Education: Exercise Physiology & General Exercise Guidelines: - Group verbal and written instruction with models to review the exercise physiology of the cardiovascular system and associated critical values. Provides general exercise guidelines with specific guidelines to those with heart or lung disease.    Education: Flexibility, Balance, Mind/Body Relaxation: - Group verbal and visual presentation with interactive activity on the components of exercise prescription. Introduces F.I.T.T principle from ACSM for exercise prescriptions. Reviews F.I.T.T. principles of flexibility and balance exercise training including progression. Also discusses the mind body connection.  Reviews various relaxation techniques to help reduce and manage stress (i.e. Deep breathing, progressive muscle relaxation, and visualization). Balance handout provided to take home. Written material given at graduation.   Activity Barriers & Risk Stratification:  Activity Barriers & Cardiac Risk Stratification - 03/20/24 1034       Activity Barriers & Cardiac Risk Stratification   Activity Barriers Joint Problems;Arthritis;Left Hip Replacement   L knee   Cardiac Risk Stratification High             6 Minute Walk:  6 Minute Walk     Row Name 03/20/24 1032         6 Minute Walk   Phase Initial     Distance 1035 feet     Walk Time 6 minutes     # of Rest Breaks 0     MPH 1.96     METS 2.32     RPE 13     Perceived Dyspnea  2     VO2 Peak 8.11     Symptoms Yes (comment)     Comments SOB     Resting HR 64 bpm     Resting BP 150/70     Resting  Oxygen Saturation  100 %     Exercise Oxygen Saturation  during 6 min walk 99 %     Max Ex. HR 104 bpm     Max Ex. BP 184/88     2 Minute Post BP 178/80              Oxygen Initial Assessment:   Oxygen Re-Evaluation:   Oxygen Discharge (Final Oxygen Re-Evaluation):   Initial Exercise Prescription:  Initial Exercise Prescription - 03/20/24 1000       Date of Initial Exercise RX and Referring Provider   Date 03/20/24    Referring Provider End, Veryl Gottron, MD      Oxygen   Maintain Oxygen Saturation 88% or higher      Recumbant Bike   Level 2    RPM 50    Watts 25    Minutes 15    METs 2.32      NuStep   Level 2    SPM 80    Minutes 15    METs 2.32      Biostep-RELP   Level 2    SPM 50    Minutes 15    METs 2.32      Track   Laps 20    Minutes 15    METs 2.09      Prescription Details   Frequency (times per week) 3    Duration Progress to 30 minutes of continuous aerobic without signs/symptoms of physical  distress      Intensity   THRR 40-80% of Max Heartrate 95-126    Ratings of Perceived Exertion 11-13    Perceived Dyspnea 0-4      Progression   Progression Continue to progress workloads to maintain intensity without signs/symptoms of physical distress.      Resistance Training   Training Prescription Yes    Weight 5 lb    Reps 10-15             Perform Capillary Blood Glucose checks as needed.  Exercise Prescription Changes:   Exercise Prescription Changes     Row Name 03/20/24 1000             Response to Exercise   Blood Pressure (Admit) 150/70       Blood Pressure (Exercise) 184/88       Blood Pressure (Exit) 138/70       Heart Rate (Admit) 64 bpm       Heart Rate (Exercise) 104 bpm       Heart Rate (Exit) 68 bpm       Oxygen Saturation (Admit) 100 %       Oxygen Saturation (Exercise) 99 %       Oxygen Saturation (Exit) 100 %       Rating of Perceived Exertion (Exercise) 13       Perceived Dyspnea (Exercise) 2        Symptoms SOB       Comments results       Intensity THRR New         Progression   Average METs 2.32                Exercise Comments:   Exercise Goals and Review:   Exercise Goals     Row Name 03/20/24 1037             Exercise Goals   Increase Physical Activity Yes       Intervention Provide advice, education, support and counseling about physical activity/exercise needs.;Develop an individualized exercise prescription for aerobic and resistive training based on initial evaluation findings, risk stratification, comorbidities and participant's personal goals.       Expected Outcomes Short Term: Attend rehab on a regular basis to increase amount of physical activity.;Long Term: Add in home exercise to make exercise part of routine and to increase amount of physical activity.;Long Term: Exercising regularly at least 3-5 days a week.       Increase Strength and Stamina Yes       Intervention Provide advice, education, support and counseling about physical activity/exercise needs.;Develop an individualized exercise prescription for aerobic and resistive training based on initial evaluation findings, risk stratification, comorbidities and participant's personal goals.       Expected Outcomes Short Term: Increase workloads from initial exercise prescription for resistance, speed, and METs.;Short Term: Perform resistance training exercises routinely during rehab and add in resistance training at home;Long Term: Improve cardiorespiratory fitness, muscular endurance and strength as measured by increased METs and functional capacity ( )       Able to understand and use rate of perceived exertion (RPE) scale Yes       Intervention Provide education and explanation on how to use RPE scale       Expected Outcomes Short Term: Able to use RPE daily in rehab to express subjective intensity level;Long Term:  Able to use RPE to guide intensity level when exercising independently  Able to understand and use Dyspnea scale Yes       Intervention Provide education and explanation on how to use Dyspnea scale       Expected Outcomes Short Term: Able to use Dyspnea scale daily in rehab to express subjective sense of shortness of breath during exertion;Long Term: Able to use Dyspnea scale to guide intensity level when exercising independently       Knowledge and understanding of Target Heart Rate Range (THRR) Yes       Intervention Provide education and explanation of THRR including how the numbers were predicted and where they are located for reference       Expected Outcomes Short Term: Able to state/look up THRR;Short Term: Able to use daily as guideline for intensity in rehab;Long Term: Able to use THRR to govern intensity when exercising independently       Able to check pulse independently Yes       Intervention Provide education and demonstration on how to check pulse in carotid and radial arteries.;Review the importance of being able to check your own pulse for safety during independent exercise       Expected Outcomes Short Term: Able to explain why pulse checking is important during independent exercise;Long Term: Able to check pulse independently and accurately       Understanding of Exercise Prescription Yes       Intervention Provide education, explanation, and written materials on patient's individual exercise prescription       Expected Outcomes Short Term: Able to explain program exercise prescription;Long Term: Able to explain home exercise prescription to exercise independently                Exercise Goals Re-Evaluation :   Discharge Exercise Prescription (Final Exercise Prescription Changes):  Exercise Prescription Changes - 03/20/24 1000       Response to Exercise   Blood Pressure (Admit) 150/70    Blood Pressure (Exercise) 184/88    Blood Pressure (Exit) 138/70    Heart Rate (Admit) 64 bpm    Heart Rate (Exercise) 104 bpm    Heart Rate (Exit) 68  bpm    Oxygen Saturation (Admit) 100 %    Oxygen Saturation (Exercise) 99 %    Oxygen Saturation (Exit) 100 %    Rating of Perceived Exertion (Exercise) 13    Perceived Dyspnea (Exercise) 2    Symptoms SOB    Comments results    Intensity THRR New      Progression   Average METs 2.32             Nutrition:  Target Goals: Understanding of nutrition guidelines, daily intake of sodium 1500mg , cholesterol 200mg , calories 30% from fat and 7% or less from saturated fats, daily to have 5 or more servings of fruits and vegetables.  Education: All About Nutrition: -Group instruction provided by verbal, written material, interactive activities, discussions, models, and posters to present general guidelines for heart healthy nutrition including fat, fiber, MyPlate, the role of sodium in heart healthy nutrition, utilization of the nutrition label, and utilization of this knowledge for meal planning. Follow up email sent as well. Written material given at graduation. Flowsheet Row Cardiac Rehab from 03/20/2024 in [facility] Cardiac and Pulmonary Rehab  Education need identified 03/20/24       Biometrics:  Pre Biometrics - 03/20/24 1038       Pre Biometrics   Height 5' 7.64" (1.718 m)    Weight 194 lb 9.6 oz (88.3 kg)  Waist Circumference 44.5 inches    Hip Circumference 41.8 inches    Waist to Hip Ratio 1.06 %    BMI (Calculated) 29.91    Single Leg Stand 6 seconds              Nutrition Therapy Plan and Nutrition Goals:  Nutrition Therapy & Goals - 03/20/24 1044       Personal Nutrition Goals   Nutrition Goal Will meet with RD on 6/30      Intervention Plan   Intervention Prescribe, educate and counsel regarding individualized specific dietary modifications aiming towards targeted core components such as weight, hypertension, lipid management, diabetes, heart failure and other comorbidities.;Nutrition handout(s) given to patient.    Expected Outcomes Short Term Goal:  Understand basic principles of dietary content, such as calories, fat, sodium, cholesterol and nutrients.;Short Term Goal: A plan has been developed with personal nutrition goals set during dietitian appointment.;Long Term Goal: Adherence to prescribed nutrition plan.             Nutrition Assessments:  MEDIFICTS Score Key: >=70 Need to make dietary changes  40-70 Heart Healthy Diet <= 40 Therapeutic Level Cholesterol Diet  Flowsheet Row Cardiac Rehab from 03/20/2024 in [facility] Cardiac and Pulmonary Rehab  Picture Your Plate Total Score on Admission 71      Picture Your Plate Scores: <08 Unhealthy dietary pattern with much room for improvement. 41-50 Dietary pattern unlikely to meet recommendations for good health and room for improvement. 51-60 More healthful dietary pattern, with some room for improvement.  >60 Healthy dietary pattern, although there may be some specific behaviors that could be improved.    Nutrition Goals Re-Evaluation:   Nutrition Goals Discharge (Final Nutrition Goals Re-Evaluation):   Psychosocial: Target Goals: Acknowledge presence or absence of significant depression and/or stress, maximize coping skills, provide positive support system. Participant is able to verbalize types and ability to use techniques and skills needed for reducing stress and depression.   Education: Stress, Anxiety, and Depression - Group verbal and visual presentation to define topics covered.  Reviews how body is impacted by stress, anxiety, and depression.  Also discusses healthy ways to reduce stress and to treat/manage anxiety and depression.  Written material given at graduation.   Education: Sleep Hygiene -Provides group verbal and written instruction about how sleep can affect your health.  Define sleep hygiene, discuss sleep cycles and impact of sleep habits. Review good sleep hygiene tips.    Initial Review & Psychosocial Screening:  Initial Psych Review & Screening -  03/19/24 1322       Initial Review   Current issues with None Identified      Family Dynamics   Good Support System? Yes   wife, daughter     Barriers   Psychosocial barriers to participate in program There are no identifiable barriers or psychosocial needs.      Screening Interventions   Interventions Encouraged to exercise;To provide support and resources with identified psychosocial needs;Provide feedback about the scores to participant    Expected Outcomes Short Term goal: Utilizing psychosocial counselor, staff and physician to assist with identification of specific Stressors or current issues interfering with healing process. Setting desired goal for each stressor or current issue identified.;Long Term Goal: Stressors or current issues are controlled or eliminated.;Short Term goal: Identification and review with participant of any Quality of Life or Depression concerns found by scoring the questionnaire.;Long Term goal: The participant improves quality of Life and PHQ9 Scores as seen by post scores  and/or verbalization of changes             Quality of Life Scores:   Quality of Life - 03/20/24 1044       Quality of Life   Select Quality of Life      Quality of Life Scores   Health/Function Pre 17.04 %    Socioeconomic Pre 23.75 %    Psych/Spiritual Pre 23.29 %    Family Pre 25.2 %    GLOBAL Pre 20.94 %            Scores of 19 and below usually indicate a poorer quality of life in these areas.  A difference of  2-3 points is a clinically meaningful difference.  A difference of 2-3 points in the total score of the Quality of Life Index has been associated with significant improvement in overall quality of life, self-image, physical symptoms, and general health in studies assessing change in quality of life.  PHQ-9: Review Flowsheet       03/20/2024 02/05/2024 06/07/2018  Depression screen PHQ 2/9  Decreased Interest 0 0 0  Down, Depressed, Hopeless 0 0 0  PHQ - 2  Score 0 0 0  Altered sleeping 1 0 -  Tired, decreased energy 1 3 -  Change in appetite 0 0 -  Feeling bad or failure about yourself  0 0 -  Trouble concentrating 0 0 -  Moving slowly or fidgety/restless 0 0 -  Suicidal thoughts 0 0 -  PHQ-9 Score 2 3 -  Difficult doing work/chores Not difficult at all Somewhat difficult -   Interpretation of Total Score  Total Score Depression Severity:  1-4 = Minimal depression, 5-9 = Mild depression, 10-14 = Moderate depression, 15-19 = Moderately severe depression, 20-27 = Severe depression   Psychosocial Evaluation and Intervention:  Psychosocial Evaluation - 03/19/24 1347       Psychosocial Evaluation & Interventions   Interventions Encouraged to exercise with the program and follow exercise prescription    Comments There are no barriers to attending the program. He lives with his wife. She and his daughter are his support. DAughter comes to stay several a week.    He wants to get stronger and get back to normal living. He is ready to start the program.    Expected Outcomes STG attends all scheduled sessions, works on exercise progression as tolerated.  LTG continues with exercise progession and utlizing resources, education to maintain healthy lifestyle    Continue Psychosocial Services  Follow up required by staff             Psychosocial Re-Evaluation:   Psychosocial Discharge (Final Psychosocial Re-Evaluation):   Vocational Rehabilitation: Provide vocational rehab assistance to qualifying candidates.   Vocational Rehab Evaluation & Intervention:   Education: Education Goals: Education classes will be provided on a variety of topics geared toward better understanding of heart health and risk factor modification. Participant will state understanding/return demonstration of topics presented as noted by education test scores.  Learning Barriers/Preferences:  Learning Barriers/Preferences - 03/19/24 1329       Learning  Barriers/Preferences   Learning Barriers Hearing   hearing aids            General Cardiac Education Topics:  AED/CPR: - Group verbal and written instruction with the use of models to demonstrate the basic use of the AED with the basic ABC's of resuscitation.   Anatomy and Cardiac Procedures: - Group verbal and visual presentation and models provide information about  basic cardiac anatomy and function. Reviews the testing methods done to diagnose heart disease and the outcomes of the test results. Describes the treatment choices: Medical Management, Angioplasty, or Coronary Bypass Surgery for treating various heart conditions including Myocardial Infarction, Angina, Valve Disease, and Cardiac Arrhythmias.  Written material given at graduation.   Medication Safety: - Group verbal and visual instruction to review commonly prescribed medications for heart and lung disease. Reviews the medication, class of the drug, and side effects. Includes the steps to properly store meds and maintain the prescription regimen.  Written material given at graduation.   Intimacy: - Group verbal instruction through game format to discuss how heart and lung disease can affect sexual intimacy. Written material given at graduation..   Know Your Numbers and Heart Failure: - Group verbal and visual instruction to discuss disease risk factors for cardiac and pulmonary disease and treatment options.  Reviews associated critical values for Overweight/Obesity, Hypertension, Cholesterol, and Diabetes.  Discusses basics of heart failure: signs/symptoms and treatments.  Introduces Heart Failure Zone chart for action plan for heart failure.  Written material given at graduation.   Infection Prevention: - Provides verbal and written material to individual with discussion of infection control including proper hand washing and proper equipment cleaning during exercise session. Flowsheet Row Cardiac Rehab from 03/20/2024 in  [facility] Cardiac and Pulmonary Rehab  Date 03/20/24  Educator MB  Instruction Review Code 1- Verbalizes Understanding       Falls Prevention: - Provides verbal and written material to individual with discussion of falls prevention and safety. Flowsheet Row Cardiac Rehab from 03/20/2024 in [facility] Cardiac and Pulmonary Rehab  Date 03/20/24  Educator MB  Instruction Review Code 1- Verbalizes Understanding       Other: -Provides group and verbal instruction on various topics (see comments)   Knowledge Questionnaire Score:  Knowledge Questionnaire Score - 03/20/24 1046       Knowledge Questionnaire Score   Pre Score 25/26             Core Components/Risk Factors/Patient Goals at Admission:  Personal Goals and Risk Factors at Admission - 03/20/24 1047       Core Components/Risk Factors/Patient Goals on Admission    Weight Management Yes;Weight Loss    Intervention Weight Management: Develop a combined nutrition and exercise program designed to reach desired caloric intake, while maintaining appropriate intake of nutrient and fiber, sodium and fats, and appropriate energy expenditure required for the weight goal.;Weight Management: Provide education and appropriate resources to help participant work on and attain dietary goals.;Weight Management/Obesity: Establish reasonable short term and long term weight goals.    Admit Weight 194 lb 9.6 oz (88.3 kg)    Goal Weight: Short Term 189 lb (85.7 kg)    Goal Weight: Long Term 160 lb (72.6 kg)    Expected Outcomes Short Term: Continue to assess and modify interventions until short term weight is achieved;Long Term: Adherence to nutrition and physical activity/exercise program aimed toward attainment of established weight goal;Weight Loss: Understanding of general recommendations for a balanced deficit meal plan, which promotes 1-2 lb weight loss per week and includes a negative energy balance of [phone] kcal/d;Understanding recommendations  for meals to include 15-35% energy as protein, 25-35% energy from fat, 35-60% energy from carbohydrates, less than 200mg  of dietary cholesterol, 20-35 gm of total fiber daily;Understanding of distribution of calorie intake throughout the day with the consumption of 4-5 meals/snacks    Hypertension Yes    Intervention Provide education on lifestyle  modifcations including regular physical activity/exercise, weight management, moderate sodium restriction and increased consumption of fresh fruit, vegetables, and low fat dairy, alcohol moderation, and smoking cessation.;Monitor prescription use compliance.    Expected Outcomes Short Term: Continued assessment and intervention until BP is < 140/60mm HG in hypertensive participants. < 130/42mm HG in hypertensive participants with diabetes, heart failure or chronic kidney disease.;Long Term: Maintenance of blood pressure at goal levels.    Lipids Yes    Intervention Provide education and support for participant on nutrition & aerobic/resistive exercise along with prescribed medications to achieve LDL 70mg , HDL >40mg .    Expected Outcomes Short Term: Participant states understanding of desired cholesterol values and is compliant with medications prescribed. Participant is following exercise prescription and nutrition guidelines.;Long Term: Cholesterol controlled with medications as prescribed, with individualized exercise RX and with personalized nutrition plan. Value goals: LDL < 70mg , HDL > 40 mg.             Education:Diabetes - Individual verbal and written instruction to review signs/symptoms of diabetes, desired ranges of glucose level fasting, after meals and with exercise. Acknowledge that pre and post exercise glucose checks will be done for 3 sessions at entry of program.   Core Components/Risk Factors/Patient Goals Review:    Core Components/Risk Factors/Patient Goals at Discharge (Final Review):    ITP Comments:  ITP Comments     Row  Name 03/19/24 1350 03/20/24 0955 03/20/24 1031       ITP Comments Virtual orientation call completed today. he has an appointment on Date: 03/20/2024  for EP eval and gym Orientation.  Documentation of diagnosis can be found in [facility] 01/04/2024 . 30 Day review completed. Medical Director ITP review done, changes made as directed, and signed approval by Medical Director.    new to program Completed and gym orientation for cardiac rehab. Initial ITP created and sent for review to Dr. Firman Hughes, Medical Director.              Comments: Initial ITP

## 2024-03-21 ENCOUNTER — Ambulatory Visit: Admitting: Physical Therapy

## 2024-03-25 ENCOUNTER — Encounter: Admitting: *Deleted

## 2024-03-25 DIAGNOSIS — Z48812 Encounter for surgical aftercare following surgery on the circulatory system: Secondary | ICD-10-CM | POA: Diagnosis not present

## 2024-03-25 DIAGNOSIS — Z951 Presence of aortocoronary bypass graft: Secondary | ICD-10-CM

## 2024-03-25 NOTE — Progress Notes (Signed)
 Daily Session Note  Patient Details  Name: Tyler David MRN: [id number] Date of Birth: [birth year] Referring Provider:   Flowsheet Row Cardiac Rehab from 03/20/2024 in [facility] Cardiac and Pulmonary Rehab  Referring Provider End, Veryl Gottron, MD       Encounter Date: 03/25/2024  Check In:  Session Check In - 03/25/24 1115       Check-In   Supervising physician immediately available to respond to emergencies See telemetry face sheet for immediately available ER MD    Location [facility]-Cardiac & Pulmonary Rehab    Staff Present Maud Sorenson, RN, BSN, CCRP;Maxon Conetta BS, Exercise Physiologist;Meredith Manson Seitz RN,BSN;Kelly Bollinger [facility]    Virtual Visit No    Medication changes reported     No    Fall or balance concerns reported    No    Warm-up and Cool-down Performed on first and last piece of equipment    Resistance Training Performed Yes    VAD Patient? No    PAD/SET Patient? No      Pain Assessment   Currently in Pain? No/denies                Social History   Tobacco Use  Smoking Status Never  Smokeless Tobacco Never    Goals Met:  Independence with exercise equipment Exercise tolerated well No report of concerns or symptoms today  Goals Unmet:  Not Applicable  Comments: Pt able to follow exercise prescription today without complaint.  Will continue to monitor for progression.  First full day of exercise!  Patient was oriented to gym and equipment including functions, settings, policies, and procedures.  Patient's individual exercise prescription and treatment plan were reviewed.  All starting workloads were established based on the results of the 6 minute walk test done at initial orientation visit.  The plan for exercise progression was also introduced and progression will be customized based on patient's performance and goals.   Dr. Firman Hughes is Medical Director for [facility] Cardiac Rehabilitation.  Dr. Fuad Aleskerov is Medical Director for  [facility] Pulmonary Rehabilitation.

## 2024-03-26 ENCOUNTER — Ambulatory Visit: Admitting: Physical Therapy

## 2024-03-27 ENCOUNTER — Encounter: Admitting: *Deleted

## 2024-03-27 DIAGNOSIS — Z48812 Encounter for surgical aftercare following surgery on the circulatory system: Secondary | ICD-10-CM | POA: Diagnosis not present

## 2024-03-27 DIAGNOSIS — Z951 Presence of aortocoronary bypass graft: Secondary | ICD-10-CM

## 2024-03-27 NOTE — Progress Notes (Signed)
 Daily Session Note  Patient Details  Name: Tyler David MRN: [id number] Date of Birth: June 14, 1946 Referring Provider:   Flowsheet Row Cardiac Rehab from 03/20/2024 in [facility] Cardiac and Pulmonary Rehab  Referring Provider End, Veryl Gottron, MD       Encounter Date: 03/27/2024  Check In:  Session Check In - 03/27/24 1111       Check-In   Supervising physician immediately available to respond to emergencies See telemetry face sheet for immediately available ER MD    Location [facility]-Cardiac & Pulmonary Rehab    Staff Present Maud Sorenson, RN, BSN, CCRP;Noah Tickle, BS, Exercise Physiologist;Kelly Bollinger RN,BSN,MPA;Jason Martina Sledge RDN,LDN    Virtual Visit No    Medication changes reported     No    Fall or balance concerns reported    No    Warm-up and Cool-down Performed on first and last piece of equipment    Resistance Training Performed Yes    VAD Patient? No    PAD/SET Patient? No      Pain Assessment   Currently in Pain? No/denies                Social History   Tobacco Use  Smoking Status Never  Smokeless Tobacco Never    Goals Met:  Independence with exercise equipment Exercise tolerated well No report of concerns or symptoms today  Goals Unmet:  Not Applicable  Comments: Pt able to follow exercise prescription today without complaint.  Will continue to monitor for progression.    Dr. Firman Hughes is Medical Director for [facility] Cardiac Rehabilitation.  Dr. Fuad Aleskerov is Medical Director for [facility] Pulmonary Rehabilitation.

## 2024-03-28 ENCOUNTER — Ambulatory Visit: Admitting: Physical Therapy

## 2024-03-29 ENCOUNTER — Encounter: Admitting: *Deleted

## 2024-03-29 DIAGNOSIS — Z48812 Encounter for surgical aftercare following surgery on the circulatory system: Secondary | ICD-10-CM | POA: Diagnosis not present

## 2024-03-29 DIAGNOSIS — Z951 Presence of aortocoronary bypass graft: Secondary | ICD-10-CM

## 2024-03-29 NOTE — Progress Notes (Signed)
 Daily Session Note  Patient Details  Name: Tyler David MRN: [id number] Date of Birth: [birth year] Referring Provider:   Flowsheet Row Cardiac Rehab from 03/20/2024 in [facility] Cardiac and Pulmonary Rehab  Referring Provider End, Veryl Gottron, MD    Encounter Date: 03/29/2024  Check In:  Session Check In - 03/29/24 1129       Check-In   Supervising physician immediately available to respond to emergencies See telemetry face sheet for immediately available ER MD    Location [facility]-Cardiac & Pulmonary Rehab    Staff Present Maud Sorenson, RN, BSN, CCRP;Meredith Manson Seitz RN,BSN;Maxon [employer], Exercise Physiologist;Noah Tickle, BS, Exercise Physiologist    Virtual Visit No    Medication changes reported     No    Fall or balance concerns reported    No    Warm-up and Cool-down Performed on first and last piece of equipment    Resistance Training Performed Yes    VAD Patient? No    PAD/SET Patient? No      Pain Assessment   Currently in Pain? No/denies             Social History   Tobacco Use  Smoking Status Never  Smokeless Tobacco Never    Goals Met:  Independence with exercise equipment Exercise tolerated well No report of concerns or symptoms today  Goals Unmet:  Not Applicable  Comments: Pt able to follow exercise prescription today without complaint.  Will continue to monitor for progression.    Dr. Firman Hughes is Medical Director for [facility] Cardiac Rehabilitation.  Dr. Fuad Aleskerov is Medical Director for [facility] Pulmonary Rehabilitation.

## 2024-04-01 ENCOUNTER — Encounter

## 2024-04-01 ENCOUNTER — Telehealth: Payer: Self-pay | Admitting: Internal Medicine

## 2024-04-01 NOTE — Telephone Encounter (Signed)
 Spoke with pt's wife Adell Hones.  They are concerned because he has been experiencing coughing and drainage and some SOB during coughing spells greater than 1 week.  They researched his medications, metoprolol  succinate (Toprol  XL) and ezetimibe  (Zetia ) and states they read both can cause upper respiratory tract infections.  Pt did take his Toprol  XL, but held Zetia  this morning.  Pt's wife states that he has been taking Musinex DM and Claritan and they have seen some slight improvement.  They want to hold the Zetia  until they get clarification.   I explained that the upper respiratory infection is not a common side effect for either drug, and encouraged them to not stop medications until clarification from their cardiologist.  Also suggested that they schedule an appointment with pt's PCP if respiratory systems worsen.  Pt's wife stated that they would continue the Toprol  XL for his blood pressure, but feels skipping a couple of doses until further clarification will be ok.  Reassured pt and his wife I would reach back out to them once I heard from their cardiologist.  Both thanked me for the phone call.

## 2024-04-01 NOTE — Telephone Encounter (Signed)
 Pt c/o medication issue:  1. Name of Medication: ezetimibe  (ZETIA ) 10 MG tablet  metoprolol  succinate (TOPROL -XL) 25 MG 24 hr tablet   2. How are you currently taking this medication (dosage and times per day)? Has take metoprolol  today, but not ezetimibe   3. Are you having a reaction (difficulty breathing--STAT)? Yes  4. What is your medication issue? Patient's spouse states the pt has had a cough with congestion for a week with no other symptoms. They believe it may be due to the ezetimibe , or the metoprolol . Patient has not taking ezetimibe  today due to this. Please advise.

## 2024-04-03 ENCOUNTER — Ambulatory Visit: Admitting: Pharmacist

## 2024-04-03 ENCOUNTER — Encounter: Admitting: *Deleted

## 2024-04-03 ENCOUNTER — Ambulatory Visit: Admitting: Physical Therapy

## 2024-04-03 DIAGNOSIS — Z951 Presence of aortocoronary bypass graft: Secondary | ICD-10-CM

## 2024-04-03 DIAGNOSIS — Z48812 Encounter for surgical aftercare following surgery on the circulatory system: Secondary | ICD-10-CM | POA: Diagnosis not present

## 2024-04-03 NOTE — Progress Notes (Signed)
 Daily Session Note  Patient Details  Name: Tyler David MRN: [id number] Date of Birth: 16-Apr-1946 Referring Provider:   Flowsheet Row Cardiac Rehab from 03/20/2024 in [facility] Cardiac and Pulmonary Rehab  Referring Provider End, Veryl Gottron, MD    Encounter Date: 04/03/2024  Check In:  Session Check In - 04/03/24 1110       Check-In   Supervising physician immediately available to respond to emergencies See telemetry face sheet for immediately available ER MD    Location [facility]-Cardiac & Pulmonary Rehab    Staff Present Maud Sorenson, RN, BSN, CCRP;Meredith Manson Seitz RN,BSN;Maxon [place] BS, Exercise Physiologist;Joseph Hood RCP,RRT,BSRT;Jason Martina Sledge RDN,LDN    Virtual Visit No    Medication changes reported     No    Fall or balance concerns reported    No    Warm-up and Cool-down Performed on first and last piece of equipment    Resistance Training Performed Yes    VAD Patient? No    PAD/SET Patient? No      Pain Assessment   Currently in Pain? No/denies             Social History   Tobacco Use  Smoking Status Never  Smokeless Tobacco Never    Goals Met:  Independence with exercise equipment Exercise tolerated well No report of concerns or symptoms today  Goals Unmet:  Not Applicable  Comments: Pt able to follow exercise prescription today without complaint.  Will continue to monitor for progression.    Dr. Firman Hughes is Medical Director for [facility] Cardiac Rehabilitation.  Dr. Fuad Aleskerov is Medical Director for [facility] Pulmonary Rehabilitation.

## 2024-04-05 ENCOUNTER — Encounter: Admitting: *Deleted

## 2024-04-05 DIAGNOSIS — Z951 Presence of aortocoronary bypass graft: Secondary | ICD-10-CM

## 2024-04-05 DIAGNOSIS — Z48812 Encounter for surgical aftercare following surgery on the circulatory system: Secondary | ICD-10-CM | POA: Diagnosis not present

## 2024-04-05 NOTE — Progress Notes (Signed)
 Daily Session Note  Patient Details  Name: Tyler David MRN: [id number] Date of Birth: [birth year] Referring Provider:   Flowsheet Row Cardiac Rehab from 03/20/2024 in [facility] Cardiac and Pulmonary Rehab  Referring Provider End, Veryl Gottron, MD    Encounter Date: 04/05/2024  Check In:  Session Check In - 04/05/24 1115       Check-In   Supervising physician immediately available to respond to emergencies See telemetry face sheet for immediately available ER MD    Location [facility]-Cardiac & Pulmonary Rehab    Staff Present Maud Sorenson, RN, BSN, CCRP;Joseph Hood RCP,RRT,BSRT;Maxon [place] BS, Exercise Physiologist;Noah Tickle, BS, Exercise Physiologist    Virtual Visit No    Medication changes reported     No    Fall or balance concerns reported    No    Warm-up and Cool-down Performed on first and last piece of equipment    Resistance Training Performed Yes    VAD Patient? No    PAD/SET Patient? No      Pain Assessment   Currently in Pain? No/denies             Social History   Tobacco Use  Smoking Status Never  Smokeless Tobacco Never    Goals Met:  Independence with exercise equipment Exercise tolerated well No report of concerns or symptoms today  Goals Unmet:  Not Applicable  Comments: Pt able to follow exercise prescription today without complaint.  Will continue to monitor for progression.    Dr. Firman Hughes is Medical Director for [facility] Cardiac Rehabilitation.  Dr. Fuad Aleskerov is Medical Director for [facility] Pulmonary Rehabilitation.

## 2024-04-08 ENCOUNTER — Ambulatory Visit: Admitting: Physical Therapy

## 2024-04-08 ENCOUNTER — Encounter: Admitting: *Deleted

## 2024-04-08 DIAGNOSIS — Z48812 Encounter for surgical aftercare following surgery on the circulatory system: Secondary | ICD-10-CM | POA: Diagnosis not present

## 2024-04-08 DIAGNOSIS — Z951 Presence of aortocoronary bypass graft: Secondary | ICD-10-CM

## 2024-04-08 NOTE — Progress Notes (Signed)
 Daily Session Note  Patient Details  Name: Tyler David MRN: [id number] Date of Birth: [birth year] Referring Provider:   Flowsheet Row Cardiac Rehab from 03/20/2024 in [facility] Cardiac and Pulmonary Rehab  Referring Provider End, Lonni, MD    Encounter Date: 04/08/2024  Check In:  Session Check In - 04/08/24 1115       Check-In   Supervising physician immediately available to respond to emergencies See telemetry face sheet for immediately available ER MD    Location [facility]-Cardiac & Pulmonary Rehab    Staff Present Othel Durand, RN, BSN, CCRP;Meredith Tressa RN,BSN;Kelly [place]field BS, ACSM CEP, Exercise Physiologist;Kelly Bollinger [facility]    Virtual Visit No    Medication changes reported     No    Fall or balance concerns reported    No    Warm-up and Cool-down Performed on first and last piece of equipment    Resistance Training Performed Yes    VAD Patient? No    PAD/SET Patient? No      Pain Assessment   Currently in Pain? No/denies             Social History   Tobacco Use  Smoking Status Never  Smokeless Tobacco Never    Goals Met:  Independence with exercise equipment Exercise tolerated well No report of concerns or symptoms today  Goals Unmet:  Not Applicable  Comments: Pt able to follow exercise prescription today without complaint.  Will continue to monitor for progression.    Dr. Oneil Pinal is Medical Director for [facility] Cardiac Rehabilitation.  Dr. Fuad Aleskerov is Medical Director for [facility] Pulmonary Rehabilitation.

## 2024-04-10 ENCOUNTER — Ambulatory Visit: Admitting: Physical Therapy

## 2024-04-10 ENCOUNTER — Encounter

## 2024-04-10 DIAGNOSIS — Z48812 Encounter for surgical aftercare following surgery on the circulatory system: Secondary | ICD-10-CM | POA: Diagnosis not present

## 2024-04-10 DIAGNOSIS — Z951 Presence of aortocoronary bypass graft: Secondary | ICD-10-CM

## 2024-04-10 NOTE — Progress Notes (Signed)
 Daily Session Note  Patient Details  Name: Tyler David MRN: [id number] Date of Birth: 1946/05/02 Referring Provider:   Flowsheet Row Cardiac Rehab from 03/20/2024 in [facility] Cardiac and Pulmonary Rehab  Referring Provider End, Lonni, MD    Encounter Date: 04/10/2024  Check In:  Session Check In - 04/10/24 1051       Check-In   Supervising physician immediately available to respond to emergencies See telemetry face sheet for immediately available ER MD    Location [facility]-Cardiac & Pulmonary Rehab    Staff Present Burnard Davenport RN,BSN,MPA;Joseph [facility] RCP,RRT,BSRT;Margaret Best, MS, Exercise Physiologist;Jason Elnor RDN,LDN    Virtual Visit No    Medication changes reported     No    Fall or balance concerns reported    No    Warm-up and Cool-down Performed on first and last piece of equipment    Resistance Training Performed Yes    VAD Patient? No    PAD/SET Patient? No      Pain Assessment   Currently in Pain? No/denies             Social History   Tobacco Use  Smoking Status Never  Smokeless Tobacco Never    Goals Met:  Independence with exercise equipment Exercise tolerated well No report of concerns or symptoms today Strength training completed today  Goals Unmet:  Not Applicable  Comments: Pt able to follow exercise prescription today without complaint.  Will continue to monitor for progression.    Dr. Oneil Pinal is Medical Director for [facility] Cardiac Rehabilitation.  Dr. Fuad Aleskerov is Medical Director for [facility] Pulmonary Rehabilitation.

## 2024-04-12 ENCOUNTER — Encounter

## 2024-04-12 DIAGNOSIS — Z951 Presence of aortocoronary bypass graft: Secondary | ICD-10-CM

## 2024-04-12 DIAGNOSIS — Z48812 Encounter for surgical aftercare following surgery on the circulatory system: Secondary | ICD-10-CM | POA: Diagnosis not present

## 2024-04-12 NOTE — Progress Notes (Signed)
 Daily Session Note  Patient Details  Name: Tyler David MRN: [id number] Date of Birth: [birth year] Referring Provider:   Flowsheet Row Cardiac Rehab from 03/20/2024 in [facility] Cardiac and Pulmonary Rehab  Referring Provider End, Lonni, MD    Encounter Date: 04/12/2024  Check In:  Session Check In - 04/12/24 1047       Check-In   Supervising physician immediately available to respond to emergencies See telemetry face sheet for immediately available ER MD    Location [facility]-Cardiac & Pulmonary Rehab    Staff Present Devaughn Jaeger, BS, Exercise Physiologist;Puja Caffey RN,BSN,MPA;Maxon Conetta BS, Exercise Physiologist;Joseph Rolinda RCP,RRT,BSRT    Virtual Visit No    Medication changes reported     No    Fall or balance concerns reported    No    Warm-up and Cool-down Performed on first and last piece of equipment    Resistance Training Performed Yes    VAD Patient? No    PAD/SET Patient? No      Pain Assessment   Currently in Pain? No/denies             Social History   Tobacco Use  Smoking Status Never  Smokeless Tobacco Never    Goals Met:  Independence with exercise equipment Exercise tolerated well No report of concerns or symptoms today Strength training completed today  Goals Unmet:  Not Applicable  Comments: Pt able to follow exercise prescription today without complaint.  Will continue to monitor for progression.    Dr. Oneil Pinal is Medical Director for [facility] Cardiac Rehabilitation.  Dr. Fuad Aleskerov is Medical Director for [facility] Pulmonary Rehabilitation.

## 2024-04-15 ENCOUNTER — Encounter

## 2024-04-15 DIAGNOSIS — Z48812 Encounter for surgical aftercare following surgery on the circulatory system: Secondary | ICD-10-CM | POA: Diagnosis not present

## 2024-04-15 DIAGNOSIS — Z951 Presence of aortocoronary bypass graft: Secondary | ICD-10-CM

## 2024-04-15 NOTE — Progress Notes (Signed)
 Daily Session Note  Patient Details  Name: KEHINDE BOWDISH MRN: [id number] Date of Birth: July 04, 1946 Referring Provider:   Flowsheet Row Cardiac Rehab from 03/20/2024 in [facility] Cardiac and Pulmonary Rehab  Referring Provider End, Lonni, MD    Encounter Date: 04/15/2024  Check In:  Session Check In - 04/15/24 1053       Check-In   Supervising physician immediately available to respond to emergencies See telemetry face sheet for immediately available ER MD    Location [facility]-Cardiac & Pulmonary Rehab    Staff Present Burnard Davenport RN,BSN,MPA;Joseph Rolinda RCP,RRT,BSRT;Meredith Tressa RN,BSN;Maxon Conetta BS, Exercise Physiologist    Virtual Visit No    Medication changes reported     No    Fall or balance concerns reported    No    Warm-up and Cool-down Performed on first and last piece of equipment    Resistance Training Performed Yes    VAD Patient? No    PAD/SET Patient? No      Pain Assessment   Currently in Pain? No/denies             Social History   Tobacco Use  Smoking Status Never  Smokeless Tobacco Never    Goals Met:  Independence with exercise equipment Exercise tolerated well No report of concerns or symptoms today Strength training completed today  Goals Unmet:  Not Applicable  Comments: Pt able to follow exercise prescription today without complaint.  Will continue to monitor for progression.    Dr. Oneil Pinal is Medical Director for [facility] Cardiac Rehabilitation.  Dr. Fuad Aleskerov is Medical Director for [facility] Pulmonary Rehabilitation.

## 2024-04-15 NOTE — Progress Notes (Signed)
 Assessment start time: 9:58 AM  Digestive issues/concerns: no known food allergies  24-hours Recall: B: oatmeal OR whole grain cereal OR eggs L: cold cuts sandwich OR leftovers D: chicken OR fish with veggies  Beverages water  (32-48oz)  Education r/t nutrition plan Patient drinking ~32-48oz water  daily. He typically eats 3 meals per day. Reports he likes fruits and veggies. Provided mediterranean diet handout. Educated on types of fats, sources, and how to read labels. He says he doesn't read labels often, instead prefers to eat whole foods and avoid processed foods. He does report a weakness for sugar. Brainstormed several meals and snacks with foods he likes at will eat, focusing on controlling sodium, saturated fat, and sugar  Goal 1: Eat 15-30gProtein and 30-60gCarbs at each meal. Goal 2: Reduce saturated fat, less than 12g per day. Replace bad fats for more heart healthy fats. Goal 3: Read labels and reduce sodium intake to below 2300mg . Ideally 1500mg  per day.   End time 10:37 AM

## 2024-04-17 ENCOUNTER — Encounter: Attending: Internal Medicine

## 2024-04-17 DIAGNOSIS — Z951 Presence of aortocoronary bypass graft: Secondary | ICD-10-CM

## 2024-04-17 NOTE — Progress Notes (Signed)
 Daily Session Note  Patient Details  Name: Tyler David MRN: [id number] Date of Birth: 1946-02-07 Referring Provider:   Flowsheet Row Cardiac Rehab from 03/20/2024 in [facility] Cardiac and Pulmonary Rehab  Referring Provider End, Lonni, MD    Encounter Date: 04/17/2024  Check In:  Session Check In - 04/17/24 1019       Check-In   Supervising physician immediately available to respond to emergencies See telemetry face sheet for immediately available ER MD    Location [facility]-Cardiac & Pulmonary Rehab    Staff Present Rollene Paterson, MS, Exercise Physiologist;Domino Holten RN,BSN,MPA;Joseph Rolinda RCP,RRT,BSRT;Jason Elnor RDN,LDN    Virtual Visit No    Medication changes reported     No    Fall or balance concerns reported    No    Warm-up and Cool-down Performed on first and last piece of equipment    Resistance Training Performed Yes    VAD Patient? No    PAD/SET Patient? No      Pain Assessment   Currently in Pain? No/denies             Social History   Tobacco Use  Smoking Status Never  Smokeless Tobacco Never    Goals Met:  Independence with exercise equipment Exercise tolerated well No report of concerns or symptoms today Strength training completed today  Goals Unmet:  Not Applicable  Comments: Pt able to follow exercise prescription today without complaint.  Will continue to monitor for progression.    Dr. Oneil Pinal is Medical Director for [facility] Cardiac Rehabilitation.  Dr. Fuad Aleskerov is Medical Director for [facility] Pulmonary Rehabilitation.

## 2024-04-17 NOTE — Progress Notes (Signed)
 Cardiac Individual Treatment Plan  Patient Details  Name: LANNIS LICHTENWALNER MRN: [id number] Date of Birth: 1946-02-16 Referring Provider:   Flowsheet Row Cardiac Rehab from 03/20/2024 in [facility] Cardiac and Pulmonary Rehab  Referring Provider End, Lonni, MD    Initial Encounter Date:  Flowsheet Row Cardiac Rehab from 03/20/2024 in [facility] Cardiac and Pulmonary Rehab  Date 03/20/24    Visit Diagnosis: S/P CABG x 3  Patient's Home Medications on Admission:  Current Outpatient Medications:    amLODipine  (NORVASC ) 5 MG tablet, Take 1 tablet (5 mg total) by mouth daily., Disp: 90 tablet, Rfl: 3   apixaban  (ELIQUIS ) 5 MG TABS tablet, Take 1 tablet (5 mg total) by mouth 2 (two) times daily., Disp: 180 tablet, Rfl: 3   aspirin  EC 81 MG tablet, Take 1 tablet (81 mg total) by mouth daily. Swallow whole., Disp: , Rfl:    Bempedoic Acid  (NEXLETOL ) 180 MG TABS, Take 1 tablet (180 mg total) by mouth daily., Disp: 90 tablet, Rfl: 3   bisacodyl  (DULCOLAX) 5 MG EC tablet, Take 2 tablets (10 mg total) by mouth daily. (Patient not taking: Reported on 03/14/2024), Disp: , Rfl:    cholecalciferol  (VITAMIN D3) 25 MCG (1000 UNIT) tablet, Take 1 tablet (1,000 Units total) by mouth daily., Disp: 30 tablet, Rfl: 0   diclofenac  Sodium (VOLTAREN ) 1 % GEL, Apply 2 g topically 4 (four) times daily. (Patient not taking: Reported on 03/14/2024), Disp: 100 g, Rfl: 0   docusate sodium  (COLACE) 100 MG capsule, Take 2 capsules (200 mg total) by mouth daily., Disp: , Rfl:    ezetimibe  (ZETIA ) 10 MG tablet, Take 1 tablet (10 mg total) by mouth daily., Disp: 90 tablet, Rfl: 3   famotidine  (PEPCID ) 20 MG tablet, Take 1 tablet (20 mg total) by mouth every evening., Disp: 30 tablet, Rfl: 0   fluticasone  (FLONASE ) 50 MCG/ACT nasal spray, Place into both nostrils daily., Disp: , Rfl:    furosemide  (LASIX ) 40 MG tablet, Take 1 tablet (40 mg total) by mouth daily as needed (for increased shortness of breath, lower extremity swelling, or  weight gain greater than 3 pounds overnight or 5 pounds in a 7-day time span)., Disp: 30 tablet, Rfl: 6   levothyroxine  (SYNTHROID ) 50 MCG tablet, Take 1 tablet (50 mcg total) by mouth daily before breakfast., Disp: 30 tablet, Rfl: 0   loratadine  (CLARITIN ) 10 MG tablet, Take 1 tablet (10 mg total) by mouth daily. (Patient not taking: Reported on 03/14/2024), Disp: 30 tablet, Rfl: 0   MAGNESIUM  PO, Take 1 tablet by mouth daily., Disp: , Rfl:    melatonin 5 MG TABS, Take 1 tablet (5 mg total) by mouth at bedtime., Disp: 30 tablet, Rfl: 0   metoprolol  succinate (TOPROL -XL) 25 MG 24 hr tablet, Take 1 tablet (25 mg total) by mouth daily. Take with or immediately following a meal., Disp: 90 tablet, Rfl: 3   Multiple Vitamins-Minerals (ICAPS AREDS 2 PO), Take 1 capsule by mouth 2 (two) times daily., Disp: , Rfl:    nitroGLYCERIN  (NITROSTAT ) 0.4 MG SL tablet, Place 1 tablet (0.4 mg total) under the tongue every 5 (five) minutes as needed for chest pain., Disp: 25 tablet, Rfl: 0   Omega-3 Fatty Acids (FISH OIL) 1000 MG CAPS, Take 1,000 mg by mouth daily. (Patient not taking: Reported on 02/19/2024), Disp: , Rfl:    Turmeric 500 MG CAPS, Take 500 mg by mouth 2 (two) times daily. (Patient not taking: Reported on 02/19/2024), Disp: , Rfl:   Past  Medical History: Past Medical History:  Diagnosis Date   Arthritis    Cancer (HCC)    prostate cancer    Cardiomyopathy (HCC)    Chronic kidney disease, stage 3 (HCC)    Coronary artery disease    ED (erectile dysfunction)    GERD (gastroesophageal reflux disease)    Headache    hx of migraine-2015    Hyperlipidemia    Hypertension    Hypothyroidism    LBBB (left bundle branch block)    Migraine    Nocturia    Overactive bladder    Pneumonia    hx of at age 78     Tobacco Use: Social History   Tobacco Use  Smoking Status Never  Smokeless Tobacco Never    Labs: Review Flowsheet  More data exists      Latest Ref Rng & Units 12/15/2023 01/02/2024  01/04/2024 01/05/2024 01/07/2024  Labs for ITP Cardiac and Pulmonary Rehab  Cholestrol 0 - 200 mg/dL - - - - 829   LDL (calc) 0 - 99 mg/dL - - - - 887   HDL-C >59 mg/dL - - - - 26   Trlycerides <150 mg/dL - - - - 840   Hemoglobin A1c 4.8 - 5.6 % - 5.4  - - -  PH, Arterial 7.35 - 7.45 7.347  - 7.306  7.337  7.308  7.348  7.355  7.372  7.345  7.302  - -  PCO2 arterial 32 - 48 mmHg 41.8  - 34.8  41.4  33.2  38.3  36.2  39.3  38.8  44.6  - -  Bicarbonate 20.0 - 28.0 mmol/L 22.9  24.3  - 17.3  22.1  16.7  21.2  20.2  22.8  21.8  21.2  22.1  - -  TCO2 22 - 32 mmol/L 24  26  - 18  23  18  22  21  21  24  24  23  22  23  22  23   - -  Acid-base deficit 0.0 - 2.0 mmol/L 3.0  2.0  - 8.0  3.0  9.0  4.0  5.0  2.0  4.0  4.0  4.0  - -  O2 Saturation % 91  66  - 92  92  91  92  100  100  87  100  100  75.7  -    Details       Multiple values from one day are sorted in reverse-chronological order          Exercise Target Goals: Exercise Program Goal: Individual exercise prescription set using results from initial 6 min walk test and THRR while considering  patient's activity barriers and safety.   Exercise Prescription Goal: Initial exercise prescription builds to 30-45 minutes a day of aerobic activity, 2-3 days per week.  Home exercise guidelines will be given to patient during program as part of exercise prescription that the participant will acknowledge.   Education: Aerobic Exercise: - Group verbal and visual presentation on the components of exercise prescription. Introduces F.I.T.T principle from ACSM for exercise prescriptions.  Reviews F.I.T.T. principles of aerobic exercise including progression. Written material given at graduation.   Education: Resistance Exercise: - Group verbal and visual presentation on the components of exercise prescription. Introduces F.I.T.T principle from ACSM for exercise prescriptions  Reviews F.I.T.T. principles of resistance exercise including progression.  Written material given at graduation.    Education: Exercise & Equipment Safety: - Individual verbal instruction and demonstration  of equipment use and safety with use of the equipment. Flowsheet Row Cardiac Rehab from 04/10/2024 in [facility] Cardiac and Pulmonary Rehab  Date 03/20/24  Educator MB  Instruction Review Code 1- Verbalizes Understanding    Education: Exercise Physiology & General Exercise Guidelines: - Group verbal and written instruction with models to review the exercise physiology of the cardiovascular system and associated critical values. Provides general exercise guidelines with specific guidelines to those with heart or lung disease.    Education: Flexibility, Balance, Mind/Body Relaxation: - Group verbal and visual presentation with interactive activity on the components of exercise prescription. Introduces F.I.T.T principle from ACSM for exercise prescriptions. Reviews F.I.T.T. principles of flexibility and balance exercise training including progression. Also discusses the mind body connection.  Reviews various relaxation techniques to help reduce and manage stress (i.e. Deep breathing, progressive muscle relaxation, and visualization). Balance handout provided to take home. Written material given at graduation.   Activity Barriers & Risk Stratification:  Activity Barriers & Cardiac Risk Stratification - 03/20/24 1034       Activity Barriers & Cardiac Risk Stratification   Activity Barriers Joint Problems;Arthritis;Left Hip Replacement   L knee   Cardiac Risk Stratification High          6 Minute Walk:  6 Minute Walk     Row Name 03/20/24 1032         6 Minute Walk   Phase Initial     Distance 1035 feet     Walk Time 6 minutes     # of Rest Breaks 0     MPH 1.96     METS 2.32     RPE 13     Perceived Dyspnea  2     VO2 Peak 8.11     Symptoms Yes (comment)     Comments SOB     Resting HR 64 bpm     Resting BP 150/70     Resting Oxygen Saturation   100 %     Exercise Oxygen Saturation  during 6 min walk 99 %     Max Ex. HR 104 bpm     Max Ex. BP 184/88     2 Minute Post BP 178/80        Oxygen Initial Assessment:   Oxygen Re-Evaluation:   Oxygen Discharge (Final Oxygen Re-Evaluation):   Initial Exercise Prescription:  Initial Exercise Prescription - 03/20/24 1000       Date of Initial Exercise RX and Referring Provider   Date 03/20/24    Referring Provider End, Lonni, MD      Oxygen   Maintain Oxygen Saturation 88% or higher      Recumbant Bike   Level 2    RPM 50    Watts 25    Minutes 15    METs 2.32      NuStep   Level 2    SPM 80    Minutes 15    METs 2.32      Biostep-RELP   Level 2    SPM 50    Minutes 15    METs 2.32      Track   Laps 20    Minutes 15    METs 2.09      Prescription Details   Frequency (times per week) 3    Duration Progress to 30 minutes of continuous aerobic without signs/symptoms of physical distress      Intensity   THRR 40-80% of Max Heartrate 95-126  Ratings of Perceived Exertion 11-13    Perceived Dyspnea 0-4      Progression   Progression Continue to progress workloads to maintain intensity without signs/symptoms of physical distress.      Resistance Training   Training Prescription Yes    Weight 5 lb    Reps 10-15          Perform Capillary Blood Glucose checks as needed.  Exercise Prescription Changes:   Exercise Prescription Changes     Row Name 03/20/24 1000 04/04/24 1100           Response to Exercise   Blood Pressure (Admit) 150/70 146/80      Blood Pressure (Exercise) 184/88 156/76      Blood Pressure (Exit) 138/70 112/68      Heart Rate (Admit) 64 bpm 99 bpm      Heart Rate (Exercise) 104 bpm 116 bpm      Heart Rate (Exit) 68 bpm 86 bpm      Oxygen Saturation (Admit) 100 % --      Oxygen Saturation (Exercise) 99 % --      Oxygen Saturation (Exit) 100 % --      Rating of Perceived Exertion (Exercise) 13 15      Perceived  Dyspnea (Exercise) 2 --      Symptoms SOB none      Comments results First two weeks of exercise      Duration -- Progress to 30 minutes of  aerobic without signs/symptoms of physical distress      Intensity THRR New THRR unchanged        Progression   Progression -- Continue to progress workloads to maintain intensity without signs/symptoms of physical distress.      Average METs 2.32 2.07        Resistance Training   Training Prescription -- Yes      Weight -- 3 lb      Reps -- 10-15        Interval Training   Interval Training -- No        NuStep   Level -- 2      Minutes -- 15      METs -- 2.6        Track   Laps -- 20      Minutes -- 15      METs -- 2.09        Oxygen   Maintain Oxygen Saturation -- 88% or higher         Exercise Comments:   Exercise Comments     Row Name 03/25/24 1116           Exercise Comments First full day of exercise!  Patient was oriented to gym and equipment including functions, settings, policies, and procedures.  Patient's individual exercise prescription and treatment plan were reviewed.  All starting workloads were established based on the results of the 6 minute walk test done at initial orientation visit.  The plan for exercise progression was also introduced and progression will be customized based on patient's performance and goals.          Exercise Goals and Review:   Exercise Goals     Row Name 03/20/24 1037             Exercise Goals   Increase Physical Activity Yes       Intervention Provide advice, education, support and counseling about physical activity/exercise needs.;Develop an individualized exercise prescription for aerobic and  resistive training based on initial evaluation findings, risk stratification, comorbidities and participant's personal goals.       Expected Outcomes Short Term: Attend rehab on a regular basis to increase amount of physical activity.;Long Term: Add in home exercise to make  exercise part of routine and to increase amount of physical activity.;Long Term: Exercising regularly at least 3-5 days a week.       Increase Strength and Stamina Yes       Intervention Provide advice, education, support and counseling about physical activity/exercise needs.;Develop an individualized exercise prescription for aerobic and resistive training based on initial evaluation findings, risk stratification, comorbidities and participant's personal goals.       Expected Outcomes Short Term: Increase workloads from initial exercise prescription for resistance, speed, and METs.;Short Term: Perform resistance training exercises routinely during rehab and add in resistance training at home;Long Term: Improve cardiorespiratory fitness, muscular endurance and strength as measured by increased METs and functional capacity ( )       Able to understand and use rate of perceived exertion (RPE) scale Yes       Intervention Provide education and explanation on how to use RPE scale       Expected Outcomes Short Term: Able to use RPE daily in rehab to express subjective intensity level;Long Term:  Able to use RPE to guide intensity level when exercising independently       Able to understand and use Dyspnea scale Yes       Intervention Provide education and explanation on how to use Dyspnea scale       Expected Outcomes Short Term: Able to use Dyspnea scale daily in rehab to express subjective sense of shortness of breath during exertion;Long Term: Able to use Dyspnea scale to guide intensity level when exercising independently       Knowledge and understanding of Target Heart Rate Range (THRR) Yes       Intervention Provide education and explanation of THRR including how the numbers were predicted and where they are located for reference       Expected Outcomes Short Term: Able to state/look up THRR;Short Term: Able to use daily as guideline for intensity in rehab;Long Term: Able to use THRR to govern  intensity when exercising independently       Able to check pulse independently Yes       Intervention Provide education and demonstration on how to check pulse in carotid and radial arteries.;Review the importance of being able to check your own pulse for safety during independent exercise       Expected Outcomes Short Term: Able to explain why pulse checking is important during independent exercise;Long Term: Able to check pulse independently and accurately       Understanding of Exercise Prescription Yes       Intervention Provide education, explanation, and written materials on patient's individual exercise prescription       Expected Outcomes Short Term: Able to explain program exercise prescription;Long Term: Able to explain home exercise prescription to exercise independently          Exercise Goals Re-Evaluation :  Exercise Goals Re-Evaluation     Row Name 03/25/24 1116 04/04/24 1135           Exercise Goal Re-Evaluation   Exercise Goals Review Able to understand and use rate of perceived exertion (RPE) scale;Able to understand and use Dyspnea scale;Knowledge and understanding of Target Heart Rate Range (THRR);Understanding of Exercise Prescription Increase Physical Activity;Increase Strength and Stamina;Understanding of  Exercise Prescription      Comments Reviewed RPE and dyspnea scale, THR and program prescription with pt today.  Pt voiced understanding and was given a copy of goals to take home. Charlena is off to a good start in the program. He did well walking on the track and reached up to 20 laps. He also did well at level 2 on the T4 nustep and used 3 lb handweights for resistance training. We will continue to monitor his progress in the program.      Expected Outcomes Short: Use RPE daily to regulate intensity. Long: Follow program prescription in THR. Short: Continue to follow current exercise prescription. Long: Continue exercise to improve strength and stamina.          Discharge Exercise Prescription (Final Exercise Prescription Changes):  Exercise Prescription Changes - 04/04/24 1100       Response to Exercise   Blood Pressure (Admit) 146/80    Blood Pressure (Exercise) 156/76    Blood Pressure (Exit) 112/68    Heart Rate (Admit) 99 bpm    Heart Rate (Exercise) 116 bpm    Heart Rate (Exit) 86 bpm    Rating of Perceived Exertion (Exercise) 15    Symptoms none    Comments First two weeks of exercise    Duration Progress to 30 minutes of  aerobic without signs/symptoms of physical distress    Intensity THRR unchanged      Progression   Progression Continue to progress workloads to maintain intensity without signs/symptoms of physical distress.    Average METs 2.07      Resistance Training   Training Prescription Yes    Weight 3 lb    Reps 10-15      Interval Training   Interval Training No      NuStep   Level 2    Minutes 15    METs 2.6      Track   Laps 20    Minutes 15    METs 2.09      Oxygen   Maintain Oxygen Saturation 88% or higher          Nutrition:  Target Goals: Understanding of nutrition guidelines, daily intake of sodium 1500mg , cholesterol 200mg , calories 30% from fat and 7% or less from saturated fats, daily to have 5 or more servings of fruits and vegetables.  Education: All About Nutrition: -Group instruction provided by verbal, written material, interactive activities, discussions, models, and posters to present general guidelines for heart healthy nutrition including fat, fiber, MyPlate, the role of sodium in heart healthy nutrition, utilization of the nutrition label, and utilization of this knowledge for meal planning. Follow up email sent as well. Written material given at graduation. Flowsheet Row Cardiac Rehab from 04/10/2024 in [facility] Cardiac and Pulmonary Rehab  Education need identified 03/20/24    Biometrics:  Pre Biometrics - 03/20/24 1038       Pre Biometrics   Height 5' 7.64 (1.718 m)     Weight 194 lb 9.6 oz (88.3 kg)    Waist Circumference 44.5 inches    Hip Circumference 41.8 inches    Waist to Hip Ratio 1.06 %    BMI (Calculated) 29.91    Single Leg Stand 6 seconds           Nutrition Therapy Plan and Nutrition Goals:  Nutrition Therapy & Goals - 04/15/24 1311       Nutrition Therapy   Diet Cardiac, Low Na    Protein (specify units)  90    Fiber 30 grams    Whole Grain Foods 3 servings    Saturated Fats 15 max. grams    Fruits and Vegetables 5 servings/day    Sodium 2 grams      Personal Nutrition Goals   Nutrition Goal Eat 15-30gProtein and 30-60gCarbs at each meal.    Personal Goal #2 Reduce saturated fat, less than 12g per day. Replace bad fats for more heart healthy fats.    Personal Goal #3 Read labels and reduce sodium intake to below 2300mg . Ideally 1500mg  per day.    Comments Patient drinking ~32-48oz water  daily. He typically eats 3 meals per day. Reports he likes fruits and veggies. Provided mediterranean diet handout. Educated on types of fats, sources, and how to read labels. He says he doesn't read labels often, instead prefers to eat whole foods and avoid processed foods. He does report a weakness for sugar. Brainstormed several meals and snacks with foods he likes at will eat, focusing on controlling sodium, saturated fat, and sugar      Intervention Plan   Intervention Nutrition handout(s) given to patient.;Prescribe, educate and counsel regarding individualized specific dietary modifications aiming towards targeted core components such as weight, hypertension, lipid management, diabetes, heart failure and other comorbidities.    Expected Outcomes Short Term Goal: Understand basic principles of dietary content, such as calories, fat, sodium, cholesterol and nutrients.;Short Term Goal: A plan has been developed with personal nutrition goals set during dietitian appointment.;Long Term Goal: Adherence to prescribed nutrition plan.           Nutrition Assessments:  MEDIFICTS Score Key: >=70 Need to make dietary changes  40-70 Heart Healthy Diet <= 40 Therapeutic Level Cholesterol Diet  Flowsheet Row Cardiac Rehab from 03/20/2024 in [facility] Cardiac and Pulmonary Rehab  Picture Your Plate Total Score on Admission 71   Picture Your Plate Scores: <59 Unhealthy dietary pattern with much room for improvement. 41-50 Dietary pattern unlikely to meet recommendations for good health and room for improvement. 51-60 More healthful dietary pattern, with some room for improvement.  >60 Healthy dietary pattern, although there may be some specific behaviors that could be improved.    Nutrition Goals Re-Evaluation:  Nutrition Goals Re-Evaluation     Row Name 04/05/24 1117             Goals   Nutrition Goal Has meeting with RD on 6/30       Expected Outcome Short: Meet with RD. Long: adhere to a diet that pertains to him.          Nutrition Goals Discharge (Final Nutrition Goals Re-Evaluation):  Nutrition Goals Re-Evaluation - 04/05/24 1117       Goals   Nutrition Goal Has meeting with RD on 6/30    Expected Outcome Short: Meet with RD. Long: adhere to a diet that pertains to him.          Psychosocial: Target Goals: Acknowledge presence or absence of significant depression and/or stress, maximize coping skills, provide positive support system. Participant is able to verbalize types and ability to use techniques and skills needed for reducing stress and depression.   Education: Stress, Anxiety, and Depression - Group verbal and visual presentation to define topics covered.  Reviews how body is impacted by stress, anxiety, and depression.  Also discusses healthy ways to reduce stress and to treat/manage anxiety and depression.  Written material given at graduation.   Education: Sleep Hygiene -Provides group verbal and written instruction about how  sleep can affect your health.  Define sleep hygiene, discuss sleep cycles  and impact of sleep habits. Review good sleep hygiene tips.    Initial Review & Psychosocial Screening:  Initial Psych Review & Screening - 03/19/24 1322       Initial Review   Current issues with None Identified      Family Dynamics   Good Support System? Yes   wife, daughter     Barriers   Psychosocial barriers to participate in program There are no identifiable barriers or psychosocial needs.      Screening Interventions   Interventions Encouraged to exercise;To provide support and resources with identified psychosocial needs;Provide feedback about the scores to participant    Expected Outcomes Short Term goal: Utilizing psychosocial counselor, staff and physician to assist with identification of specific Stressors or current issues interfering with healing process. Setting desired goal for each stressor or current issue identified.;Long Term Goal: Stressors or current issues are controlled or eliminated.;Short Term goal: Identification and review with participant of any Quality of Life or Depression concerns found by scoring the questionnaire.;Long Term goal: The participant improves quality of Life and PHQ9 Scores as seen by post scores and/or verbalization of changes          Quality of Life Scores:   Quality of Life - 03/20/24 1044       Quality of Life   Select Quality of Life      Quality of Life Scores   Health/Function Pre 17.04 %    Socioeconomic Pre 23.75 %    Psych/Spiritual Pre 23.29 %    Family Pre 25.2 %    GLOBAL Pre 20.94 %         Scores of 19 and below usually indicate a poorer quality of life in these areas.  A difference of  2-3 points is a clinically meaningful difference.  A difference of 2-3 points in the total score of the Quality of Life Index has been associated with significant improvement in overall quality of life, self-image, physical symptoms, and general health in studies assessing change in quality of life.  PHQ-9: Review Flowsheet        03/20/2024 02/05/2024 06/07/2018  Depression screen PHQ 2/9  Decreased Interest 0 0 0  Down, Depressed, Hopeless 0 0 0  PHQ - 2 Score 0 0 0  Altered sleeping 1 0 -  Tired, decreased energy 1 3 -  Change in appetite 0 0 -  Feeling bad or failure about yourself  0 0 -  Trouble concentrating 0 0 -  Moving slowly or fidgety/restless 0 0 -  Suicidal thoughts 0 0 -  PHQ-9 Score 2 3 -  Difficult doing work/chores Not difficult at all Somewhat difficult -   Interpretation of Total Score  Total Score Depression Severity:  1-4 = Minimal depression, 5-9 = Mild depression, 10-14 = Moderate depression, 15-19 = Moderately severe depression, 20-27 = Severe depression   Psychosocial Evaluation and Intervention:  Psychosocial Evaluation - 03/19/24 1347       Psychosocial Evaluation & Interventions   Interventions Encouraged to exercise with the program and follow exercise prescription    Comments There are no barriers to attending the program. He lives with his wife. She and his daughter are his support. DAughter comes to stay several a week.    He wants to get stronger and get back to normal living. He is ready to start the program.    Expected Outcomes STG attends all scheduled  sessions, works on exercise progression as tolerated.  LTG continues with exercise progession and utlizing resources, education to maintain healthy lifestyle    Continue Psychosocial Services  Follow up required by staff          Psychosocial Re-Evaluation:  Psychosocial Re-Evaluation     Row Name 04/05/24 1118             Psychosocial Re-Evaluation   Current issues with None Identified       Comments Patient reports no issues with their current mental states, sleep, stress, depression or anxiety. Will follow up with patient in a few weeks for any changes.       Expected Outcomes Short: Continue to exercise regularly to support mental health and notify staff of any changes. Long: maintain mental health and  well being through teaching of rehab or prescribed medications independently.       Interventions Encouraged to attend Cardiac Rehabilitation for the exercise       Continue Psychosocial Services  Follow up required by staff          Psychosocial Discharge (Final Psychosocial Re-Evaluation):  Psychosocial Re-Evaluation - 04/05/24 1118       Psychosocial Re-Evaluation   Current issues with None Identified    Comments Patient reports no issues with their current mental states, sleep, stress, depression or anxiety. Will follow up with patient in a few weeks for any changes.    Expected Outcomes Short: Continue to exercise regularly to support mental health and notify staff of any changes. Long: maintain mental health and well being through teaching of rehab or prescribed medications independently.    Interventions Encouraged to attend Cardiac Rehabilitation for the exercise    Continue Psychosocial Services  Follow up required by staff          Vocational Rehabilitation: Provide vocational rehab assistance to qualifying candidates.   Vocational Rehab Evaluation & Intervention:   Education: Education Goals: Education classes will be provided on a variety of topics geared toward better understanding of heart health and risk factor modification. Participant will state understanding/return demonstration of topics presented as noted by education test scores.  Learning Barriers/Preferences:  Learning Barriers/Preferences - 03/19/24 1329       Learning Barriers/Preferences   Learning Barriers Hearing   hearing aids         General Cardiac Education Topics:  AED/CPR: - Group verbal and written instruction with the use of models to demonstrate the basic use of the AED with the basic ABC's of resuscitation.   Anatomy and Cardiac Procedures: - Group verbal and visual presentation and models provide information about basic cardiac anatomy and function. Reviews the testing methods  done to diagnose heart disease and the outcomes of the test results. Describes the treatment choices: Medical Management, Angioplasty, or Coronary Bypass Surgery for treating various heart conditions including Myocardial Infarction, Angina, Valve Disease, and Cardiac Arrhythmias.  Written material given at graduation. Flowsheet Row Cardiac Rehab from 04/10/2024 in [facility] Cardiac and Pulmonary Rehab  Date 04/03/24  Educator [facility]  Instruction Review Code 1- Verbalizes Understanding    Medication Safety: - Group verbal and visual instruction to review commonly prescribed medications for heart and lung disease. Reviews the medication, class of the drug, and side effects. Includes the steps to properly store meds and maintain the prescription regimen.  Written material given at graduation. Flowsheet Row Cardiac Rehab from 04/10/2024 in [facility] Cardiac and Pulmonary Rehab  Date 04/10/24  Educator sb  Instruction Review Code 1- [employer]  Understanding    Intimacy: - Group verbal instruction through game format to discuss how heart and lung disease can affect sexual intimacy. Written material given at graduation..   Know Your Numbers and Heart Failure: - Group verbal and visual instruction to discuss disease risk factors for cardiac and pulmonary disease and treatment options.  Reviews associated critical values for Overweight/Obesity, Hypertension, Cholesterol, and Diabetes.  Discusses basics of heart failure: signs/symptoms and treatments.  Introduces Heart Failure Zone chart for action plan for heart failure.  Written material given at graduation.   Infection Prevention: - Provides verbal and written material to individual with discussion of infection control including proper hand washing and proper equipment cleaning during exercise session. Flowsheet Row Cardiac Rehab from 04/10/2024 in [facility] Cardiac and Pulmonary Rehab  Date 03/20/24  Educator MB  Instruction Review Code 1- Verbalizes Understanding     Falls Prevention: - Provides verbal and written material to individual with discussion of falls prevention and safety. Flowsheet Row Cardiac Rehab from 04/10/2024 in [facility] Cardiac and Pulmonary Rehab  Date 03/20/24  Educator MB  Instruction Review Code 1- Verbalizes Understanding    Other: -Provides group and verbal instruction on various topics (see comments)   Knowledge Questionnaire Score:  Knowledge Questionnaire Score - 03/20/24 1046       Knowledge Questionnaire Score   Pre Score 25/26          Core Components/Risk Factors/Patient Goals at Admission:  Personal Goals and Risk Factors at Admission - 03/20/24 1047       Core Components/Risk Factors/Patient Goals on Admission    Weight Management Yes;Weight Loss    Intervention Weight Management: Develop a combined nutrition and exercise program designed to reach desired caloric intake, while maintaining appropriate intake of nutrient and fiber, sodium and fats, and appropriate energy expenditure required for the weight goal.;Weight Management: Provide education and appropriate resources to help participant work on and attain dietary goals.;Weight Management/Obesity: Establish reasonable short term and long term weight goals.    Admit Weight 194 lb 9.6 oz (88.3 kg)    Goal Weight: Short Term 189 lb (85.7 kg)    Goal Weight: Long Term 160 lb (72.6 kg)    Expected Outcomes Short Term: Continue to assess and modify interventions until short term weight is achieved;Long Term: Adherence to nutrition and physical activity/exercise program aimed toward attainment of established weight goal;Weight Loss: Understanding of general recommendations for a balanced deficit meal plan, which promotes 1-2 lb weight loss per week and includes a negative energy balance of [phone] kcal/d;Understanding recommendations for meals to include 15-35% energy as protein, 25-35% energy from fat, 35-60% energy from carbohydrates, less than 200mg  of dietary  cholesterol, 20-35 gm of total fiber daily;Understanding of distribution of calorie intake throughout the day with the consumption of 4-5 meals/snacks    Hypertension Yes    Intervention Provide education on lifestyle modifcations including regular physical activity/exercise, weight management, moderate sodium restriction and increased consumption of fresh fruit, vegetables, and low fat dairy, alcohol moderation, and smoking cessation.;Monitor prescription use compliance.    Expected Outcomes Short Term: Continued assessment and intervention until BP is < 140/55mm HG in hypertensive participants. < 130/72mm HG in hypertensive participants with diabetes, heart failure or chronic kidney disease.;Long Term: Maintenance of blood pressure at goal levels.    Lipids Yes    Intervention Provide education and support for participant on nutrition & aerobic/resistive exercise along with prescribed medications to achieve LDL 70mg , HDL >40mg .    Expected Outcomes Short  Term: Participant states understanding of desired cholesterol values and is compliant with medications prescribed. Participant is following exercise prescription and nutrition guidelines.;Long Term: Cholesterol controlled with medications as prescribed, with individualized exercise RX and with personalized nutrition plan. Value goals: LDL < 70mg , HDL > 40 mg.          Education:Diabetes - Individual verbal and written instruction to review signs/symptoms of diabetes, desired ranges of glucose level fasting, after meals and with exercise. Acknowledge that pre and post exercise glucose checks will be done for 3 sessions at entry of program.   Core Components/Risk Factors/Patient Goals Review:   Goals and Risk Factor Review     Row Name 04/05/24 1120             Core Components/Risk Factors/Patient Goals Review   Personal Goals Review Weight Management/Obesity       Review Arden wants to try to lose weight and has lose 2 pounds since  starting the program. Informed him to try to lose around 5 pounds in the next few weeks.  He is meeting with the RD on June 30th about his nutrition.       Expected Outcomes Short: lose 5 pounds in the next few weeks. Long: reach weight goal.          Core Components/Risk Factors/Patient Goals at Discharge (Final Review):   Goals and Risk Factor Review - 04/05/24 1120       Core Components/Risk Factors/Patient Goals Review   Personal Goals Review Weight Management/Obesity    Review Katrina wants to try to lose weight and has lose 2 pounds since starting the program. Informed him to try to lose around 5 pounds in the next few weeks.  He is meeting with the RD on June 30th about his nutrition.    Expected Outcomes Short: lose 5 pounds in the next few weeks. Long: reach weight goal.          ITP Comments:  ITP Comments     Row Name 03/19/24 1350 03/20/24 0955 03/20/24 1031 03/25/24 1116 04/17/24 0957   ITP Comments Virtual orientation call completed today. he has an appointment on Date: 03/20/2024  for EP eval and gym Orientation.  Documentation of diagnosis can be found in [facility] 01/04/2024 . 30 Day review completed. Medical Director ITP review done, changes made as directed, and signed approval by Medical Director.    new to program Completed and gym orientation for cardiac rehab. Initial ITP created and sent for review to Dr. Oneil Pinal, Medical Director. First full day of exercise!  Patient was oriented to gym and equipment including functions, settings, policies, and procedures.  Patient's individual exercise prescription and treatment plan were reviewed.  All starting workloads were established based on the results of the 6 minute walk test done at initial orientation visit.  The plan for exercise progression was also introduced and progression will be customized based on patient's performance and goals. 30 Day review completed. Medical Director ITP review done, changes made as directed,  and signed approval by Medical Director. New to program      Comments: 30 day review

## 2024-04-17 NOTE — Progress Notes (Signed)
 30 Day review completed. Medical Director ITP review done, changes made as directed, and signed approval by Medical Director.     New program.

## 2024-04-22 ENCOUNTER — Encounter

## 2024-04-22 DIAGNOSIS — Z951 Presence of aortocoronary bypass graft: Secondary | ICD-10-CM | POA: Diagnosis not present

## 2024-04-22 NOTE — Progress Notes (Signed)
 Daily Session Note  Patient Details  Name: Tyler David MRN: [id number] Date of Birth: Sep 26, 1946 Referring Provider:   Flowsheet Row Cardiac Rehab from 03/20/2024 in [facility] Cardiac and Pulmonary Rehab  Referring Provider End, Lonni, MD    Encounter Date: 04/22/2024  Check In:  Session Check In - 04/22/24 1101       Check-In   Supervising physician immediately available to respond to emergencies See telemetry face sheet for immediately available ER MD    Location [facility]-Cardiac & Pulmonary Rehab    Staff Present Burnard Davenport RN,BSN,MPA;Joseph Rolinda RCP,RRT,BSRT;Meredith Tressa RN,BSN    Virtual Visit No    Medication changes reported     No    Fall or balance concerns reported    No    Warm-up and Cool-down Performed on first and last piece of equipment    Resistance Training Performed Yes    VAD Patient? No    PAD/SET Patient? No      Pain Assessment   Currently in Pain? No/denies             Social History   Tobacco Use  Smoking Status Never  Smokeless Tobacco Never    Goals Met:  Independence with exercise equipment Exercise tolerated well No report of concerns or symptoms today Strength training completed today  Goals Unmet:  Not Applicable  Comments: Pt able to follow exercise prescription today without complaint.  Will continue to monitor for progression.    Dr. Oneil Pinal is Medical Director for [facility] Cardiac Rehabilitation.  Dr. Fuad Aleskerov is Medical Director for [facility] Pulmonary Rehabilitation.

## 2024-04-24 ENCOUNTER — Encounter

## 2024-04-24 DIAGNOSIS — Z951 Presence of aortocoronary bypass graft: Secondary | ICD-10-CM

## 2024-04-24 NOTE — Progress Notes (Signed)
 Daily Session Note  Patient Details  Name: Tyler David MRN: [id number] Date of Birth: 04/08/1946 Referring Provider:   Flowsheet Row Cardiac Rehab from 03/20/2024 in [facility] Cardiac and Pulmonary Rehab  Referring Provider End, Lonni, MD    Encounter Date: 04/24/2024  Check In:  Session Check In - 04/24/24 1015       Check-In   Supervising physician immediately available to respond to emergencies See telemetry face sheet for immediately available ER MD    Location [facility]-Cardiac & Pulmonary Rehab    Staff Present Burnard Davenport RN,BSN,MPA;Joseph [facility] RCP,RRT,BSRT;Margaret Best, MS, Exercise Physiologist;Jason Elnor RDN,LDN    Virtual Visit No    Medication changes reported     No    Fall or balance concerns reported    No    Warm-up and Cool-down Performed on first and last piece of equipment    Resistance Training Performed Yes    VAD Patient? No    PAD/SET Patient? No      Pain Assessment   Currently in Pain? No/denies             Social History   Tobacco Use  Smoking Status Never  Smokeless Tobacco Never    Goals Met:  Independence with exercise equipment Exercise tolerated well No report of concerns or symptoms today Strength training completed today  Goals Unmet:  Not Applicable  Comments: Pt able to follow exercise prescription today without complaint.  Will continue to monitor for progression.    Dr. Oneil Pinal is Medical Director for [facility] Cardiac Rehabilitation.  Dr. Fuad Aleskerov is Medical Director for [facility] Pulmonary Rehabilitation.

## 2024-04-25 DIAGNOSIS — I480 Paroxysmal atrial fibrillation: Secondary | ICD-10-CM | POA: Diagnosis not present

## 2024-04-25 DIAGNOSIS — K219 Gastro-esophageal reflux disease without esophagitis: Secondary | ICD-10-CM | POA: Diagnosis not present

## 2024-04-25 DIAGNOSIS — E039 Hypothyroidism, unspecified: Secondary | ICD-10-CM | POA: Diagnosis not present

## 2024-04-25 DIAGNOSIS — I10 Essential (primary) hypertension: Secondary | ICD-10-CM | POA: Diagnosis not present

## 2024-04-25 DIAGNOSIS — E7800 Pure hypercholesterolemia, unspecified: Secondary | ICD-10-CM | POA: Diagnosis not present

## 2024-04-25 DIAGNOSIS — N1832 Chronic kidney disease, stage 3b: Secondary | ICD-10-CM | POA: Diagnosis not present

## 2024-04-25 DIAGNOSIS — Z0000 Encounter for general adult medical examination without abnormal findings: Secondary | ICD-10-CM | POA: Diagnosis not present

## 2024-04-25 DIAGNOSIS — Z8546 Personal history of malignant neoplasm of prostate: Secondary | ICD-10-CM | POA: Diagnosis not present

## 2024-04-25 DIAGNOSIS — Z08 Encounter for follow-up examination after completed treatment for malignant neoplasm: Secondary | ICD-10-CM | POA: Diagnosis not present

## 2024-04-25 DIAGNOSIS — I2510 Atherosclerotic heart disease of native coronary artery without angina pectoris: Secondary | ICD-10-CM | POA: Diagnosis not present

## 2024-04-26 ENCOUNTER — Encounter: Admitting: *Deleted

## 2024-04-26 DIAGNOSIS — Z951 Presence of aortocoronary bypass graft: Secondary | ICD-10-CM

## 2024-04-26 NOTE — Progress Notes (Signed)
 Daily Session Note  Patient Details  Name: Tyler David MRN: [id number] Date of Birth: [birth year] Referring Provider:   Flowsheet Row Cardiac Rehab from 03/20/2024 in [facility] Cardiac and Pulmonary Rehab  Referring Provider End, Lonni, MD    Encounter Date: 04/26/2024  Check In:  Session Check In - 04/26/24 1116       Check-In   Supervising physician immediately available to respond to emergencies See telemetry face sheet for immediately available ER MD    Location [facility]-Cardiac & Pulmonary Rehab    Staff Present Othel Durand, RN, BSN, CCRP;Meredith Tressa RN,BSN;Maxon [employer], Exercise Physiologist;Joseph Hood RCP,RRT,BSRT    Virtual Visit No    Medication changes reported     No    Fall or balance concerns reported    No    Warm-up and Cool-down Performed on first and last piece of equipment    Resistance Training Performed Yes    VAD Patient? No    PAD/SET Patient? No      Pain Assessment   Currently in Pain? No/denies             Social History   Tobacco Use  Smoking Status Never  Smokeless Tobacco Never    Goals Met:  Independence with exercise equipment Exercise tolerated well No report of concerns or symptoms today  Goals Unmet:  Not Applicable  Comments: Pt able to follow exercise prescription today without complaint.  Will continue to monitor for progression.    Dr. Oneil Pinal is Medical Director for [facility] Cardiac Rehabilitation.  Dr. Fuad Aleskerov is Medical Director for [facility] Pulmonary Rehabilitation.

## 2024-04-29 ENCOUNTER — Encounter

## 2024-04-29 DIAGNOSIS — Z951 Presence of aortocoronary bypass graft: Secondary | ICD-10-CM | POA: Diagnosis not present

## 2024-04-29 NOTE — Progress Notes (Signed)
 Daily Session Note  Patient Details  Name: Tyler David MRN: [id number] Date of Birth: April 12, 1946 Referring Provider:   Flowsheet Row Cardiac Rehab from 03/20/2024 in [facility] Cardiac and Pulmonary Rehab  Referring Provider End, Lonni, MD    Encounter Date: 04/29/2024  Check In:  Session Check In - 04/29/24 1113       Check-In   Supervising physician immediately available to respond to emergencies See telemetry face sheet for immediately available ER MD    Location [facility]-Cardiac & Pulmonary Rehab    Staff Present Burnard Davenport RN,BSN,MPA;Joseph Rolinda RCP,RRT,BSRT;Kristen Coble RN,BC,MSN;Jason Elnor RDN,LDN    Virtual Visit No    Medication changes reported     No    Fall or balance concerns reported    No    Warm-up and Cool-down Performed on first and last piece of equipment    Resistance Training Performed Yes    VAD Patient? No    PAD/SET Patient? No      Pain Assessment   Currently in Pain? No/denies             Social History   Tobacco Use  Smoking Status Never  Smokeless Tobacco Never    Goals Met:  Independence with exercise equipment Exercise tolerated well No report of concerns or symptoms today Strength training completed today  Goals Unmet:  Not Applicable  Comments: Pt able to follow exercise prescription today without complaint.  Will continue to monitor for progression.    Dr. Oneil Pinal is Medical Director for [facility] Cardiac Rehabilitation.  Dr. Fuad Aleskerov is Medical Director for [facility] Pulmonary Rehabilitation.

## 2024-05-01 ENCOUNTER — Encounter

## 2024-05-01 DIAGNOSIS — Z951 Presence of aortocoronary bypass graft: Secondary | ICD-10-CM

## 2024-05-01 DIAGNOSIS — D2262 Melanocytic nevi of left upper limb, including shoulder: Secondary | ICD-10-CM | POA: Diagnosis not present

## 2024-05-01 DIAGNOSIS — L570 Actinic keratosis: Secondary | ICD-10-CM | POA: Diagnosis not present

## 2024-05-01 DIAGNOSIS — Z85820 Personal history of malignant melanoma of skin: Secondary | ICD-10-CM | POA: Diagnosis not present

## 2024-05-01 DIAGNOSIS — D485 Neoplasm of uncertain behavior of skin: Secondary | ICD-10-CM | POA: Diagnosis not present

## 2024-05-01 DIAGNOSIS — D2272 Melanocytic nevi of left lower limb, including hip: Secondary | ICD-10-CM | POA: Diagnosis not present

## 2024-05-01 DIAGNOSIS — D0422 Carcinoma in situ of skin of left ear and external auricular canal: Secondary | ICD-10-CM | POA: Diagnosis not present

## 2024-05-01 DIAGNOSIS — Z85828 Personal history of other malignant neoplasm of skin: Secondary | ICD-10-CM | POA: Diagnosis not present

## 2024-05-01 DIAGNOSIS — D2261 Melanocytic nevi of right upper limb, including shoulder: Secondary | ICD-10-CM | POA: Diagnosis not present

## 2024-05-01 DIAGNOSIS — D225 Melanocytic nevi of trunk: Secondary | ICD-10-CM | POA: Diagnosis not present

## 2024-05-01 DIAGNOSIS — D692 Other nonthrombocytopenic purpura: Secondary | ICD-10-CM | POA: Diagnosis not present

## 2024-05-01 NOTE — Progress Notes (Signed)
 Daily Session Note  Patient Details  Name: BRAND SIEVER MRN: [id number] Date of Birth: January 03, 1946 Referring Provider:   Flowsheet Row Cardiac Rehab from 03/20/2024 in [facility] Cardiac and Pulmonary Rehab  Referring Provider End, Lonni, MD    Encounter Date: 05/01/2024  Check In:  Session Check In - 05/01/24 1016       Check-In   Supervising physician immediately available to respond to emergencies See telemetry face sheet for immediately available ER MD    Location [facility]-Cardiac & Pulmonary Rehab    Staff Present Burnard Davenport RN,BSN,MPA;Maxon Burnell BS, Exercise Physiologist;Joseph [facility] Dyane BS, ACSM CEP, Exercise Physiologist;Jason Elnor RDN,LDN    Virtual Visit No    Medication changes reported     No    Fall or balance concerns reported    No    Warm-up and Cool-down Performed on first and last piece of equipment    Resistance Training Performed Yes    VAD Patient? No    PAD/SET Patient? No      Pain Assessment   Currently in Pain? No/denies             Social History   Tobacco Use  Smoking Status Never  Smokeless Tobacco Never    Goals Met:  Independence with exercise equipment Exercise tolerated well No report of concerns or symptoms today Strength training completed today  Goals Unmet:  Not Applicable  Comments: Pt able to follow exercise prescription today without complaint.  Will continue to monitor for progression.  Reviewed home exercise with pt today.  Pt plans to walk for exercise.  Reviewed THR, pulse, RPE, sign and symptoms, pulse oximetery and when to call 911 or MD.  Also discussed weather considerations and indoor options.  Pt voiced understanding.   Dr. Oneil Pinal is Medical Director for [facility] Cardiac Rehabilitation.  Dr. Fuad Aleskerov is Medical Director for [facility] Pulmonary Rehabilitation.

## 2024-05-03 ENCOUNTER — Encounter

## 2024-05-03 DIAGNOSIS — Z951 Presence of aortocoronary bypass graft: Secondary | ICD-10-CM | POA: Diagnosis not present

## 2024-05-03 NOTE — Progress Notes (Signed)
 Daily Session Note  Patient Details  Name: Tyler David MRN: [id number] Date of Birth: October 07, 1946 Referring Provider:   Flowsheet Row Cardiac Rehab from 03/20/2024 in [facility] Cardiac and Pulmonary Rehab  Referring Provider End, Lonni, MD    Encounter Date: 05/03/2024  Check In:  Session Check In - 05/03/24 1102       Check-In   Supervising physician immediately available to respond to emergencies See telemetry face sheet for immediately available ER MD    Location [facility]-Cardiac & Pulmonary Rehab    Staff Present Burnard Davenport RN,BSN,MPA;Maxon Burnell BS, Exercise Physiologist;Joseph [facility] Dyane BS, ACSM CEP, Exercise Physiologist    Virtual Visit No    Medication changes reported     No    Fall or balance concerns reported    No    Warm-up and Cool-down Performed on first and last piece of equipment    Resistance Training Performed Yes    VAD Patient? No    PAD/SET Patient? No      Pain Assessment   Currently in Pain? No/denies             Social History   Tobacco Use  Smoking Status Never  Smokeless Tobacco Never    Goals Met:  Independence with exercise equipment Exercise tolerated well No report of concerns or symptoms today Strength training completed today  Goals Unmet:  Not Applicable  Comments: Pt able to follow exercise prescription today without complaint.  Will continue to monitor for progression.    Dr. Oneil Pinal is Medical Director for [facility] Cardiac Rehabilitation.  Dr. Fuad Aleskerov is Medical Director for [facility] Pulmonary Rehabilitation.

## 2024-05-06 ENCOUNTER — Encounter

## 2024-05-06 DIAGNOSIS — Z951 Presence of aortocoronary bypass graft: Secondary | ICD-10-CM | POA: Diagnosis not present

## 2024-05-06 NOTE — Progress Notes (Signed)
 Daily Session Note  Patient Details  Name: Tyler David MRN: [id number] Date of Birth: 14-Oct-1946 Referring Provider:   Flowsheet Row Cardiac Rehab from 03/20/2024 in [facility] Cardiac and Pulmonary Rehab  Referring Provider End, Lonni, MD    Encounter Date: 05/06/2024  Check In:  Session Check In - 05/06/24 1104       Check-In   Supervising physician immediately available to respond to emergencies See telemetry face sheet for immediately available ER MD    Location [facility]-Cardiac & Pulmonary Rehab    Staff Present Burnard Davenport RN,BSN,MPA;Maxon Burnell BS, Exercise Physiologist;Joseph [facility] Dyane BS, ACSM CEP, Exercise Physiologist    Virtual Visit No    Medication changes reported     No    Fall or balance concerns reported    No    Warm-up and Cool-down Performed on first and last piece of equipment    Resistance Training Performed Yes    VAD Patient? No    PAD/SET Patient? No      Pain Assessment   Currently in Pain? No/denies             Social History   Tobacco Use  Smoking Status Never  Smokeless Tobacco Never    Goals Met:  Independence with exercise equipment Exercise tolerated well No report of concerns or symptoms today Strength training completed today  Goals Unmet:  Not Applicable  Comments: Pt able to follow exercise prescription today without complaint.  Will continue to monitor for progression.    Dr. Oneil Pinal is Medical Director for [facility] Cardiac Rehabilitation.  Dr. Fuad Aleskerov is Medical Director for [facility] Pulmonary Rehabilitation.

## 2024-05-08 ENCOUNTER — Encounter

## 2024-05-08 DIAGNOSIS — Z951 Presence of aortocoronary bypass graft: Secondary | ICD-10-CM

## 2024-05-08 NOTE — Progress Notes (Signed)
 Daily Session Note  Patient Details  Name: Tyler David MRN: [id number] Date of Birth: Mar 28, 1946 Referring Provider:   Flowsheet Row Cardiac Rehab from 03/20/2024 in [facility] Cardiac and Pulmonary Rehab  Referring Provider End, Lonni, MD    Encounter Date: 05/08/2024  Check In:  Session Check In - 05/08/24 1032       Check-In   Supervising physician immediately available to respond to emergencies See telemetry face sheet for immediately available ER MD    Location [facility]-Cardiac & Pulmonary Rehab    Staff Present Burnard Davenport RN,BSN,MPA;Joseph [facility] RCP,RRT,BSRT;Margaret Best, MS, Exercise Physiologist;Jason Elnor RDN,LDN;Noah Tickle, BS, Exercise Physiologist    Virtual Visit No    Medication changes reported     No    Fall or balance concerns reported    No    Warm-up and Cool-down Performed on first and last piece of equipment    Resistance Training Performed Yes    VAD Patient? No    PAD/SET Patient? No      Pain Assessment   Currently in Pain? No/denies             Social History   Tobacco Use  Smoking Status Never  Smokeless Tobacco Never    Goals Met:  Independence with exercise equipment Exercise tolerated well No report of concerns or symptoms today Strength training completed today  Goals Unmet:  Not Applicable  Comments: Pt able to follow exercise prescription today without complaint.  Will continue to monitor for progression.    Dr. Oneil Pinal is Medical Director for [facility] Cardiac Rehabilitation.  Dr. Fuad Aleskerov is Medical Director for [facility] Pulmonary Rehabilitation.

## 2024-05-10 ENCOUNTER — Encounter

## 2024-05-10 DIAGNOSIS — Z951 Presence of aortocoronary bypass graft: Secondary | ICD-10-CM | POA: Diagnosis not present

## 2024-05-10 NOTE — Progress Notes (Signed)
 Daily Session Note  Patient Details  Name: JAMS TRICKETT MRN: [id number] Date of Birth: Mar 09, 1946 Referring Provider:   Flowsheet Row Cardiac Rehab from 03/20/2024 in [facility] Cardiac and Pulmonary Rehab  Referring Provider End, Lonni, MD    Encounter Date: 05/10/2024  Check In:  Session Check In - 05/10/24 1052       Check-In   Supervising physician immediately available to respond to emergencies See telemetry face sheet for immediately available ER MD    Location [facility]-Cardiac & Pulmonary Rehab    Staff Present Burnard Davenport RN,BSN,MPA;Maxon Conetta BS, Exercise Physiologist;Joseph Rolinda NORWOOD HARMAN Verlie Laird, [place], Exercise Physiologist    Virtual Visit No    Medication changes reported     No    Fall or balance concerns reported    No    Warm-up and Cool-down Performed on first and last piece of equipment    Resistance Training Performed Yes    VAD Patient? No    PAD/SET Patient? No      Pain Assessment   Currently in Pain? No/denies             Social History   Tobacco Use  Smoking Status Never  Smokeless Tobacco Never    Goals Met:  Independence with exercise equipment Exercise tolerated well No report of concerns or symptoms today Strength training completed today  Goals Unmet:  Not Applicable  Comments: Pt able to follow exercise prescription today without complaint.  Will continue to monitor for progression.    Dr. Oneil Pinal is Medical Director for [facility] Cardiac Rehabilitation.  Dr. Fuad Aleskerov is Medical Director for [facility] Pulmonary Rehabilitation.

## 2024-05-13 ENCOUNTER — Encounter

## 2024-05-13 DIAGNOSIS — Z951 Presence of aortocoronary bypass graft: Secondary | ICD-10-CM | POA: Diagnosis not present

## 2024-05-13 NOTE — Progress Notes (Signed)
 Daily Session Note  Patient Details  Name: Tyler David MRN: [id number] Date of Birth: [birth year] Referring Provider:   Flowsheet Row Cardiac Rehab from 03/20/2024 in [facility] Cardiac and Pulmonary Rehab  Referring Provider End, Lonni, MD    Encounter Date: 05/13/2024  Check In:  Session Check In - 05/13/24 1055       Check-In   Supervising physician immediately available to respond to emergencies See telemetry face sheet for immediately available ER MD    Location [facility]-Cardiac & Pulmonary Rehab    Staff Present Burnard Davenport RN,BSN,MPA;Maxon Burnell BS, Exercise Physiologist;Joseph [facility] Dyane BS, ACSM CEP, Exercise Physiologist    Virtual Visit No    Medication changes reported     No    Fall or balance concerns reported    No    Warm-up and Cool-down Performed on first and last piece of equipment    Resistance Training Performed Yes    VAD Patient? No    PAD/SET Patient? No      Pain Assessment   Currently in Pain? No/denies             Social History   Tobacco Use  Smoking Status Never  Smokeless Tobacco Never    Goals Met:  Independence with exercise equipment Exercise tolerated well No report of concerns or symptoms today Strength training completed today  Goals Unmet:  Not Applicable  Comments: Pt able to follow exercise prescription today without complaint.  Will continue to monitor for progression.    Dr. Oneil Pinal is Medical Director for [facility] Cardiac Rehabilitation.  Dr. Fuad Aleskerov is Medical Director for [facility] Pulmonary Rehabilitation.

## 2024-05-14 ENCOUNTER — Ambulatory Visit: Attending: Physician Assistant

## 2024-05-14 DIAGNOSIS — I255 Ischemic cardiomyopathy: Secondary | ICD-10-CM | POA: Diagnosis not present

## 2024-05-14 MED ORDER — PERFLUTREN LIPID MICROSPHERE
1.0000 mL | INTRAVENOUS | Status: AC | PRN
  Administered 2024-05-14: 3 mL via INTRAVENOUS

## 2024-05-15 ENCOUNTER — Ambulatory Visit: Payer: Self-pay | Admitting: Physician Assistant

## 2024-05-15 ENCOUNTER — Encounter

## 2024-05-15 DIAGNOSIS — R931 Abnormal findings on diagnostic imaging of heart and coronary circulation: Secondary | ICD-10-CM

## 2024-05-15 DIAGNOSIS — Z951 Presence of aortocoronary bypass graft: Secondary | ICD-10-CM

## 2024-05-15 LAB — ECHOCARDIOGRAM COMPLETE
AR max vel: 2.04 cm2
AV Area VTI: 2.06 cm2
AV Area mean vel: 1.97 cm2
AV Mean grad: 4 mmHg
AV Peak grad: 7 mmHg
Ao pk vel: 1.32 m/s
Area-P 1/2: 4.31 cm2
S' Lateral: 4.63 cm

## 2024-05-15 NOTE — Progress Notes (Signed)
 Daily Session Note  Patient Details  Name: Tyler David MRN: [id number] Date of Birth: Mar 09, 1946 Referring Provider:   Flowsheet Row Cardiac Rehab from 03/20/2024 in [facility] Cardiac and Pulmonary Rehab  Referring Provider End, Lonni, MD    Encounter Date: 05/15/2024  Check In:  Session Check In - 05/15/24 1029       Check-In   Supervising physician immediately available to respond to emergencies See telemetry face sheet for immediately available ER MD    Location [facility]-Cardiac & Pulmonary Rehab    Staff Present Burnard Davenport RN,BSN,MPA;Joseph [facility] RCP,RRT,BSRT;Margaret Best, MS, Exercise Physiologist;Jason Elnor RDN,LDN;Noah Tickle, BS, Exercise Physiologist    Virtual Visit No    Medication changes reported     No    Fall or balance concerns reported    No    Warm-up and Cool-down Performed on first and last piece of equipment    Resistance Training Performed Yes    VAD Patient? No    PAD/SET Patient? No      Pain Assessment   Currently in Pain? No/denies             Social History   Tobacco Use  Smoking Status Never  Smokeless Tobacco Never    Goals Met:  Independence with exercise equipment Exercise tolerated well No report of concerns or symptoms today Strength training completed today  Goals Unmet:  Not Applicable  Comments: Pt able to follow exercise prescription today without complaint.  Will continue to monitor for progression.    Dr. Oneil Pinal is Medical Director for [facility] Cardiac Rehabilitation.  Dr. Fuad Aleskerov is Medical Director for [facility] Pulmonary Rehabilitation.

## 2024-05-15 NOTE — Progress Notes (Signed)
 Cardiac Individual Treatment Plan  Patient Details  Name: Tyler David MRN: [id number] Date of Birth: January 12, 1946 Referring Provider:   Flowsheet Row Cardiac Rehab from 03/20/2024 in [facility] Cardiac and Pulmonary Rehab  Referring Provider End, Lonni, MD    Initial Encounter Date:  Flowsheet Row Cardiac Rehab from 03/20/2024 in [facility] Cardiac and Pulmonary Rehab  Date 03/20/24    Visit Diagnosis: S/P CABG x 3  Patient's Home Medications on Admission:  Current Outpatient Medications:    amLODipine  (NORVASC ) 5 MG tablet, Take 1 tablet (5 mg total) by mouth daily., Disp: 90 tablet, Rfl: 3   apixaban  (ELIQUIS ) 5 MG TABS tablet, Take 1 tablet (5 mg total) by mouth 2 (two) times daily., Disp: 180 tablet, Rfl: 3   aspirin  EC 81 MG tablet, Take 1 tablet (81 mg total) by mouth daily. Swallow whole., Disp: , Rfl:    Bempedoic Acid  (NEXLETOL ) 180 MG TABS, Take 1 tablet (180 mg total) by mouth daily., Disp: 90 tablet, Rfl: 3   bisacodyl  (DULCOLAX) 5 MG EC tablet, Take 2 tablets (10 mg total) by mouth daily. (Patient not taking: Reported on 79/29/2025), Disp: , Rfl:    cholecalciferol  (VITAMIN D3) 25 MCG (1000 UNIT) tablet, Take 1 tablet (1,000 Units total) by mouth daily., Disp: 30 tablet, Rfl: 0   diclofenac  Sodium (VOLTAREN ) 1 % GEL, Apply 2 g topically 4 (four) times daily. (Patient not taking: Reported on 79/29/2025), Disp: 100 g, Rfl: 0   docusate sodium  (COLACE) 100 MG capsule, Take 2 capsules (200 mg total) by mouth daily., Disp: , Rfl:    ezetimibe  (ZETIA ) 10 MG tablet, Take 1 tablet (10 mg total) by mouth daily., Disp: 90 tablet, Rfl: 3   famotidine  (PEPCID ) 20 MG tablet, Take 1 tablet (20 mg total) by mouth every evening., Disp: 30 tablet, Rfl: 0   fluticasone  (FLONASE ) 50 MCG/ACT nasal spray, Place into both nostrils daily., Disp: , Rfl:    furosemide  (LASIX ) 40 MG tablet, Take 1 tablet (40 mg total) by mouth daily as needed (for increased shortness of breath, lower extremity swelling, or  weight gain greater than 3 pounds overnight or 5 pounds in a 7-day time span)., Disp: 30 tablet, Rfl: 6   levothyroxine  (SYNTHROID ) 50 MCG tablet, Take 1 tablet (50 mcg total) by mouth daily before breakfast., Disp: 30 tablet, Rfl: 0   loratadine  (CLARITIN ) 10 MG tablet, Take 1 tablet (10 mg total) by mouth daily. (Patient not taking: Reported on 79/29/2025), Disp: 30 tablet, Rfl: 0   MAGNESIUM  PO, Take 1 tablet by mouth daily., Disp: , Rfl:    melatonin 5 MG TABS, Take 1 tablet (5 mg total) by mouth at bedtime., Disp: 30 tablet, Rfl: 0   metoprolol  succinate (TOPROL -XL) 25 MG 24 hr tablet, Take 1 tablet (25 mg total) by mouth daily. Take with or immediately following a meal., Disp: 90 tablet, Rfl: 3   Multiple Vitamins-Minerals (ICAPS AREDS 2 PO), Take 1 capsule by mouth 2 (two) times daily., Disp: , Rfl:    nitroGLYCERIN  (NITROSTAT ) 0.4 MG SL tablet, Place 1 tablet (0.4 mg total) under the tongue every 5 (five) minutes as needed for chest pain., Disp: 25 tablet, Rfl: 0   Omega-3 Fatty Acids (FISH OIL) 1000 MG CAPS, Take 1,000 mg by mouth daily. (Patient not taking: Reported on 79/02/2024), Disp: , Rfl:    Turmeric 500 MG CAPS, Take 500 mg by mouth 2 (two) times daily. (Patient not taking: Reported on 79/02/2024), Disp: , Rfl:   Past  Medical History: Past Medical History:  Diagnosis Date   Arthritis    Cancer (HCC)    prostate cancer    Cardiomyopathy (HCC)    Chronic kidney disease, stage 3 (HCC)    Coronary artery disease    ED (erectile dysfunction)    GERD (gastroesophageal reflux disease)    Headache    hx of migraine-2015    Hyperlipidemia    Hypertension    Hypothyroidism    LBBB (left bundle branch block)    Migraine    Nocturia    Overactive bladder    Pneumonia    hx of at age 78     Tobacco Use: Social History   Tobacco Use  Smoking Status Never  Smokeless Tobacco Never    Labs: Review Flowsheet  More data exists      Latest Ref Rng & Units 12/15/2023 01/02/2024  01/04/2024 01/05/2024 01/07/2024  Labs for ITP Cardiac and Pulmonary Rehab  Cholestrol 0 - 200 mg/dL - - - - 829   LDL (calc) 0 - 99 mg/dL - - - - 887   HDL-C >59 mg/dL - - - - 26   Trlycerides <150 mg/dL - - - - 840   Hemoglobin A1c 4.8 - 5.6 % - 5.4  - - -  PH, Arterial 7.35 - 7.45 7.347  - 7.306  7.337  7.308  7.348  7.355  7.372  7.345  7.302  - -  PCO2 arterial 32 - 48 mmHg 41.8  - 34.8  41.4  33.2  38.3  36.2  39.3  38.8  44.6  - -  Bicarbonate 20.0 - 28.0 mmol/L 22.9  24.3  - 17.3  22.1  16.7  21.2  20.2  22.8  21.8  21.2  22.1  - -  TCO2 22 - 32 mmol/L 24  26  - 18  23  18  22  21  21  24  24  23  22  23  22  23   - -  Acid-base deficit 0.0 - 2.0 mmol/L 3.0  2.0  - 8.0  3.0  9.0  4.0  5.0  2.0  4.0  4.0  4.0  - -  O2 Saturation % 91  66  - 92  92  91  92  100  100  87  100  100  75.7  -    Details       Multiple values from one day are sorted in reverse-chronological order          Exercise Target Goals: Exercise Program Goal: Individual exercise prescription set using results from initial 6 min walk test and THRR while considering  patient's activity barriers and safety.   Exercise Prescription Goal: Initial exercise prescription builds to 30-45 minutes a day of aerobic activity, 2-3 days per week.  Home exercise guidelines will be given to patient during program as part of exercise prescription that the participant will acknowledge.   Education: Aerobic Exercise: - Group verbal and visual presentation on the components of exercise prescription. Introduces F.I.T.T principle from ACSM for exercise prescriptions.  Reviews F.I.T.T. principles of aerobic exercise including progression. Written material given at graduation.   Education: Resistance Exercise: - Group verbal and visual presentation on the components of exercise prescription. Introduces F.I.T.T principle from ACSM for exercise prescriptions  Reviews F.I.T.T. principles of resistance exercise including progression.  Written material given at graduation.    Education: Exercise & Equipment Safety: - Individual verbal instruction and demonstration  of equipment use and safety with use of the equipment. Flowsheet Row Cardiac Rehab from 05/08/2024 in [facility] Cardiac and Pulmonary Rehab  Date 03/20/24  Educator MB  Instruction Review Code 1- Verbalizes Understanding    Education: Exercise Physiology & General Exercise Guidelines: - Group verbal and written instruction with models to review the exercise physiology of the cardiovascular system and associated critical values. Provides general exercise guidelines with specific guidelines to those with heart or lung disease.  Flowsheet Row Cardiac Rehab from 05/08/2024 in [facility] Cardiac and Pulmonary Rehab  Date 05/08/24  Educator nt  Instruction Review Code 1- TEFL teacher Understanding    Education: Flexibility, Balance, Mind/Body Relaxation: - Group verbal and visual presentation with interactive activity on the components of exercise prescription. Introduces F.I.T.T principle from ACSM for exercise prescriptions. Reviews F.I.T.T. principles of flexibility and balance exercise training including progression. Also discusses the mind body connection.  Reviews various relaxation techniques to help reduce and manage stress (i.e. Deep breathing, progressive muscle relaxation, and visualization). Balance handout provided to take home. Written material given at graduation.   Activity Barriers & Risk Stratification:  Activity Barriers & Cardiac Risk Stratification - 03/20/24 1034       Activity Barriers & Cardiac Risk Stratification   Activity Barriers Joint Problems;Arthritis;Left Hip Replacement   L knee   Cardiac Risk Stratification High          6 Minute Walk:  6 Minute Walk     Row Name 03/20/24 1032         6 Minute Walk   Phase Initial     Distance 1035 feet     Walk Time 6 minutes     # of Rest Breaks 0     MPH 1.96     METS 2.32     RPE 13      Perceived Dyspnea  2     VO2 Peak 8.11     Symptoms Yes (comment)     Comments SOB     Resting HR 64 bpm     Resting BP 150/70     Resting Oxygen Saturation  100 %     Exercise Oxygen Saturation  during 6 min walk 99 %     Max Ex. HR 104 bpm     Max Ex. BP 184/88     2 Minute Post BP 178/80        Oxygen Initial Assessment:   Oxygen Re-Evaluation:   Oxygen Discharge (Final Oxygen Re-Evaluation):   Initial Exercise Prescription:  Initial Exercise Prescription - 03/20/24 1000       Date of Initial Exercise RX and Referring Provider   Date 03/20/24    Referring Provider End, Lonni, MD      Oxygen   Maintain Oxygen Saturation 88% or higher      Recumbant Bike   Level 2    RPM 50    Watts 25    Minutes 15    METs 2.32      NuStep   Level 2    SPM 80    Minutes 15    METs 2.32      Biostep-RELP   Level 2    SPM 50    Minutes 15    METs 2.32      Track   Laps 20    Minutes 15    METs 2.09      Prescription Details   Frequency (times per week) 3    Duration Progress to  30 minutes of continuous aerobic without signs/symptoms of physical distress      Intensity   THRR 40-80% of Max Heartrate 95-126    Ratings of Perceived Exertion 11-13    Perceived Dyspnea 0-4      Progression   Progression Continue to progress workloads to maintain intensity without signs/symptoms of physical distress.      Resistance Training   Training Prescription Yes    Weight 5 lb    Reps 10-15          Perform Capillary Blood Glucose checks as needed.  Exercise Prescription Changes:   Exercise Prescription Changes     Row Name 03/20/24 1000 04/04/24 1100 04/17/24 1500 05/01/24 1100 05/01/24 1400     Response to Exercise   Blood Pressure (Admit) 150/70 146/80 142/76 -- 122/70   Blood Pressure (Exercise) 184/88 156/76 168/72 -- --   Blood Pressure (Exit) 138/70 112/68 122/60 -- 128/68   Heart Rate (Admit) 64 bpm 99 bpm 77 bpm -- 69 bpm   Heart Rate  (Exercise) 104 bpm 116 bpm 118 bpm -- 111 bpm   Heart Rate (Exit) 68 bpm 86 bpm 97 bpm -- 71 bpm   Oxygen Saturation (Admit) 100 % -- -- -- --   Oxygen Saturation (Exercise) 99 % -- -- -- --   Oxygen Saturation (Exit) 100 % -- -- -- --   Rating of Perceived Exertion (Exercise) 13 15 15  -- 14   Perceived Dyspnea (Exercise) 2 -- -- -- --   Symptoms SOB none none -- none   Comments results First two weeks of exercise -- -- --   Duration -- Progress to 30 minutes of  aerobic without signs/symptoms of physical distress Progress to 30 minutes of  aerobic without signs/symptoms of physical distress Progress to 30 minutes of  aerobic without signs/symptoms of physical distress Continue with 30 min of aerobic exercise without signs/symptoms of physical distress.   Intensity THRR New THRR unchanged THRR unchanged THRR unchanged THRR unchanged     Progression   Progression -- Continue to progress workloads to maintain intensity without signs/symptoms of physical distress. Continue to progress workloads to maintain intensity without signs/symptoms of physical distress. Continue to progress workloads to maintain intensity without signs/symptoms of physical distress. Continue to progress workloads to maintain intensity without signs/symptoms of physical distress.   Average METs 2.32 2.07 2.26 2.26 2.45     Resistance Training   Training Prescription -- Yes Yes Yes Yes   Weight -- 3 lb 3 lb 3 lb 5 lb   Reps -- 10-15 10-15 10-15 10-15     Interval Training   Interval Training -- No No No No     NuStep   Level -- 2 2 2 3    Minutes -- 15 15 15 15    METs -- 2.6 2.3 2.3 2.7     T5 Nustep   Level -- -- 2 2 --   Minutes -- -- 15 15 --   METs -- -- 1.9 1.9 --     Biostep-RELP   Level -- -- 2 2 3    Minutes -- -- 15 15 15    METs -- -- 2 2 3      Track   Laps -- 20 25 25  36   Minutes -- 15 15 15 15    METs -- 2.09 2.36 2.36 2.96     Home Exercise Plan   Plans to continue exercise at -- -- --  Home (comment)  walking Home (  comment)  walking   Frequency -- -- -- Add 2 additional days to program exercise sessions. Add 2 additional days to program exercise sessions.   Initial Home Exercises Provided -- -- -- 05/01/24 05/01/24     Oxygen   Maintain Oxygen Saturation -- 88% or higher 88% or higher 88% or higher 88% or higher    Row Name 05/14/24 1400             Response to Exercise   Blood Pressure (Admit) 134/72       Blood Pressure (Exercise) 128/62       Blood Pressure (Exit) 132/64       Heart Rate (Admit) 72 bpm       Heart Rate (Exercise) 113 bpm       Heart Rate (Exit) 71 bpm       Rating of Perceived Exertion (Exercise) 13       Perceived Dyspnea (Exercise) 1       Symptoms none       Duration Continue with 30 min of aerobic exercise without signs/symptoms of physical distress.       Intensity THRR unchanged         Progression   Progression Continue to progress workloads to maintain intensity without signs/symptoms of physical distress.       Average METs 2.26         Resistance Training   Training Prescription Yes       Weight 5 lb       Reps 10-15         Interval Training   Interval Training No         NuStep   Level 3       Minutes 15       METs 2.88         Biostep-RELP   Level 3       Minutes 15       METs 2         Track   Laps 27       Minutes 15       METs 2.47         Home Exercise Plan   Plans to continue exercise at Home (comment)  walking       Frequency Add 2 additional days to program exercise sessions.       Initial Home Exercises Provided 05/01/24         Oxygen   Maintain Oxygen Saturation 88% or higher          Exercise Comments:   Exercise Comments     Row Name 03/25/24 1116           Exercise Comments First full day of exercise!  Patient was oriented to gym and equipment including functions, settings, policies, and procedures.  Patient's individual exercise prescription and treatment plan were reviewed.  All  starting workloads were established based on the results of the 6 minute walk test done at initial orientation visit.  The plan for exercise progression was also introduced and progression will be customized based on patient's performance and goals.          Exercise Goals and Review:   Exercise Goals     Row Name 03/20/24 1037             Exercise Goals   Increase Physical Activity Yes       Intervention Provide advice, education, support and counseling about physical activity/exercise needs.;Develop an individualized  exercise prescription for aerobic and resistive training based on initial evaluation findings, risk stratification, comorbidities and participant's personal goals.       Expected Outcomes Short Term: Attend rehab on a regular basis to increase amount of physical activity.;Long Term: Add in home exercise to make exercise part of routine and to increase amount of physical activity.;Long Term: Exercising regularly at least 3-5 days a week.       Increase Strength and Stamina Yes       Intervention Provide advice, education, support and counseling about physical activity/exercise needs.;Develop an individualized exercise prescription for aerobic and resistive training based on initial evaluation findings, risk stratification, comorbidities and participant's personal goals.       Expected Outcomes Short Term: Increase workloads from initial exercise prescription for resistance, speed, and METs.;Short Term: Perform resistance training exercises routinely during rehab and add in resistance training at home;Long Term: Improve cardiorespiratory fitness, muscular endurance and strength as measured by increased METs and functional capacity ( )       Able to understand and use rate of perceived exertion (RPE) scale Yes       Intervention Provide education and explanation on how to use RPE scale       Expected Outcomes Short Term: Able to use RPE daily in rehab to express subjective  intensity level;Long Term:  Able to use RPE to guide intensity level when exercising independently       Able to understand and use Dyspnea scale Yes       Intervention Provide education and explanation on how to use Dyspnea scale       Expected Outcomes Short Term: Able to use Dyspnea scale daily in rehab to express subjective sense of shortness of breath during exertion;Long Term: Able to use Dyspnea scale to guide intensity level when exercising independently       Knowledge and understanding of Target Heart Rate Range (THRR) Yes       Intervention Provide education and explanation of THRR including how the numbers were predicted and where they are located for reference       Expected Outcomes Short Term: Able to state/look up THRR;Short Term: Able to use daily as guideline for intensity in rehab;Long Term: Able to use THRR to govern intensity when exercising independently       Able to check pulse independently Yes       Intervention Provide education and demonstration on how to check pulse in carotid and radial arteries.;Review the importance of being able to check your own pulse for safety during independent exercise       Expected Outcomes Short Term: Able to explain why pulse checking is important during independent exercise;Long Term: Able to check pulse independently and accurately       Understanding of Exercise Prescription Yes       Intervention Provide education, explanation, and written materials on patient's individual exercise prescription       Expected Outcomes Short Term: Able to explain program exercise prescription;Long Term: Able to explain home exercise prescription to exercise independently          Exercise Goals Re-Evaluation :  Exercise Goals Re-Evaluation     Row Name 03/25/24 1116 04/04/24 1135 04/17/24 1600 04/29/24 1124 05/01/24 1139     Exercise Goal Re-Evaluation   Exercise Goals Review Able to understand and use rate of perceived exertion (RPE) scale;Able to  understand and use Dyspnea scale;Knowledge and understanding of Target Heart Rate Range (THRR);Understanding of Exercise Prescription Increase Physical  Activity;Increase Strength and Stamina;Understanding of Exercise Prescription Increase Physical Activity;Increase Strength and Stamina;Understanding of Exercise Prescription Increase Physical Activity;Increase Strength and Stamina;Understanding of Exercise Prescription Increase Physical Activity;Able to understand and use rate of perceived exertion (RPE) scale;Knowledge and understanding of Target Heart Rate Range (THRR);Understanding of Exercise Prescription;Increase Strength and Stamina;Able to understand and use Dyspnea scale;Able to check pulse independently   Comments Reviewed RPE and dyspnea scale, THR and program prescription with pt today.  Pt voiced understanding and was given a copy of goals to take home. Charlena is off to a good start in the program. He did well walking on the track and reached up to 20 laps. He also did well at level 2 on the T4 nustep and used 3 lb handweights for resistance training. We will continue to monitor his progress in the program. Charlena is doing well in rehab. He has recently been able to increase his track laps from 20-25. He was also able to maintain level 2 on the T4 nuste, T5 nustep, and biostep. We will continue to monitor his progress in the program. Charlena is doing well in rehab, currently on level 3 on biostep. He is not doing much exercise at home. Spoke with Charlena about [facility] and looking into attending once he finishes rehab. Reviewed home exercise with pt today.  Pt plans to walk for exercise.  Reviewed THR, pulse, RPE, sign and symptoms, pulse oximetery and when to call 911 or MD.  Also discussed weather considerations and indoor options.  Pt voiced understanding.   Expected Outcomes Short: Use RPE daily to regulate intensity. Long: Follow program prescription in THR. Short: Continue to follow current exercise  prescription. Long: Continue exercise to improve strength and stamina. Short: Continue to follow exercise prescription. Long: Continue exercise to improve strength and stamina. STG: Look into [facility]. LTG: Continue exercise to improve strength and stamina. Short: add 1-2 days a week of exercise at home on off days of cardiac rehab. Long: maintain independent exercise routine upon graduation from cardiac rehab.    Row Name 05/01/24 1432 05/14/24 1442           Exercise Goal Re-Evaluation   Exercise Goals Review Increase Physical Activity;Understanding of Exercise Prescription;Increase Strength and Stamina Increase Physical Activity;Understanding of Exercise Prescription;Increase Strength and Stamina      Comments Charlena continues to do well in rehab. He increased his laps on the track to 36 laps. He also increased to level 3 on the T4 nustep and biostep and increased his handweights to 5 lbs. We will continue to monitor his progress in the program. Charlena continues to do well in rehab. He maintained level 3 on the T4 nustep and biostep. He decreased his laps slightly to 27 from 36 on the track. We will continue to monitor his progress in the program.      Expected Outcomes Short: Continue to progressively increase track, nustep, and biostep workloads. Long: Continue exercise to improve strength and stamina. Short: Push for more laps on the track to get back to 36. Long: Continue exercise to improve strength and stamina.         Discharge Exercise Prescription (Final Exercise Prescription Changes):  Exercise Prescription Changes - 05/14/24 1400       Response to Exercise   Blood Pressure (Admit) 134/72    Blood Pressure (Exercise) 128/62    Blood Pressure (Exit) 132/64    Heart Rate (Admit) 72 bpm    Heart Rate (Exercise) 113 bpm    Heart  Rate (Exit) 71 bpm    Rating of Perceived Exertion (Exercise) 13    Perceived Dyspnea (Exercise) 1    Symptoms none    Duration Continue with 30 min of aerobic  exercise without signs/symptoms of physical distress.    Intensity THRR unchanged      Progression   Progression Continue to progress workloads to maintain intensity without signs/symptoms of physical distress.    Average METs 2.26      Resistance Training   Training Prescription Yes    Weight 5 lb    Reps 10-15      Interval Training   Interval Training No      NuStep   Level 3    Minutes 15    METs 2.88      Biostep-RELP   Level 3    Minutes 15    METs 2      Track   Laps 27    Minutes 15    METs 2.47      Home Exercise Plan   Plans to continue exercise at Home (comment)   walking   Frequency Add 2 additional days to program exercise sessions.    Initial Home Exercises Provided 05/01/24      Oxygen   Maintain Oxygen Saturation 88% or higher          Nutrition:  Target Goals: Understanding of nutrition guidelines, daily intake of sodium 1500mg , cholesterol 200mg , calories 30% from fat and 7% or less from saturated fats, daily to have 5 or more servings of fruits and vegetables.  Education: All About Nutrition: -Group instruction provided by verbal, written material, interactive activities, discussions, models, and posters to present general guidelines for heart healthy nutrition including fat, fiber, MyPlate, the role of sodium in heart healthy nutrition, utilization of the nutrition label, and utilization of this knowledge for meal planning. Follow up email sent as well. Written material given at graduation. Flowsheet Row Cardiac Rehab from 05/08/2024 in [facility] Cardiac and Pulmonary Rehab  Education need identified 03/20/24    Biometrics:  Pre Biometrics - 03/20/24 1038       Pre Biometrics   Height 5' 7.64 (1.718 m)    Weight 194 lb 9.6 oz (88.3 kg)    Waist Circumference 44.5 inches    Hip Circumference 41.8 inches    Waist to Hip Ratio 1.06 %    BMI (Calculated) 29.91    Single Leg Stand 6 seconds           Nutrition Therapy Plan and Nutrition  Goals:  Nutrition Therapy & Goals - 04/15/24 1311       Nutrition Therapy   Diet Cardiac, Low Na    Protein (specify units) 90    Fiber 30 grams    Whole Grain Foods 3 servings    Saturated Fats 15 max. grams    Fruits and Vegetables 5 servings/day    Sodium 2 grams      Personal Nutrition Goals   Nutrition Goal Eat 15-30gProtein and 30-60gCarbs at each meal.    Personal Goal #2 Reduce saturated fat, less than 12g per day. Replace bad fats for more heart healthy fats.    Personal Goal #3 Read labels and reduce sodium intake to below 2300mg . Ideally 1500mg  per day.    Comments Patient drinking ~32-48oz water  daily. He typically eats 3 meals per day. Reports he likes fruits and veggies. Provided mediterranean diet handout. Educated on types of fats, sources, and how to read labels. He  says he doesn't read labels often, instead prefers to eat whole foods and avoid processed foods. He does report a weakness for sugar. Brainstormed several meals and snacks with foods he likes at will eat, focusing on controlling sodium, saturated fat, and sugar      Intervention Plan   Intervention Nutrition handout(s) given to patient.;Prescribe, educate and counsel regarding individualized specific dietary modifications aiming towards targeted core components such as weight, hypertension, lipid management, diabetes, heart failure and other comorbidities.    Expected Outcomes Short Term Goal: Understand basic principles of dietary content, such as calories, fat, sodium, cholesterol and nutrients.;Short Term Goal: A plan has been developed with personal nutrition goals set during dietitian appointment.;Long Term Goal: Adherence to prescribed nutrition plan.          Nutrition Assessments:  MEDIFICTS Score Key: >=70 Need to make dietary changes  40-70 Heart Healthy Diet <= 40 Therapeutic Level Cholesterol Diet  Flowsheet Row Cardiac Rehab from 03/20/2024 in [facility] Cardiac and Pulmonary Rehab  Picture Your  Plate Total Score on Admission 71   Picture Your Plate Scores: <59 Unhealthy dietary pattern with much room for improvement. 41-50 Dietary pattern unlikely to meet recommendations for good health and room for improvement. 51-60 More healthful dietary pattern, with some room for improvement.  >60 Healthy dietary pattern, although there may be some specific behaviors that could be improved.    Nutrition Goals Re-Evaluation:  Nutrition Goals Re-Evaluation     Row Name 04/05/24 1117 04/29/24 1135           Goals   Nutrition Goal Has meeting with RD on 6/30 --      Comment -- Spoke with Tom about finding ways to include more protein without overconsuming in saturated fat. He was looking into protein shakes and discussed some good options to look into. He is drinking ~48oz of water  daily      Expected Outcome Short: Meet with RD. Long: adhere to a diet that pertains to him. STG: Drink 48-64oz of water  daily. LTG: follow a heart healthy diet         Nutrition Goals Discharge (Final Nutrition Goals Re-Evaluation):  Nutrition Goals Re-Evaluation - 04/29/24 1135       Goals   Comment Spoke with Charlena about finding ways to include more protein without overconsuming in saturated fat. He was looking into protein shakes and discussed some good options to look into. He is drinking ~48oz of water  daily    Expected Outcome STG: Drink 48-64oz of water  daily. LTG: follow a heart healthy diet          Psychosocial: Target Goals: Acknowledge presence or absence of significant depression and/or stress, maximize coping skills, provide positive support system. Participant is able to verbalize types and ability to use techniques and skills needed for reducing stress and depression.   Education: Stress, Anxiety, and Depression - Group verbal and visual presentation to define topics covered.  Reviews how body is impacted by stress, anxiety, and depression.  Also discusses healthy ways to reduce stress and  to treat/manage anxiety and depression.  Written material given at graduation. Flowsheet Row Cardiac Rehab from 05/08/2024 in [facility] Cardiac and Pulmonary Rehab  Date 05/01/24  Educator sb  Instruction Review Code 1- [employer] Understanding    Education: Sleep Hygiene -Provides group verbal and written instruction about how sleep can affect your health.  Define sleep hygiene, discuss sleep cycles and impact of sleep habits. Review good sleep hygiene tips.    Initial  Review & Psychosocial Screening:  Initial Psych Review & Screening - 03/19/24 1322       Initial Review   Current issues with None Identified      Family Dynamics   Good Support System? Yes   wife, daughter     Barriers   Psychosocial barriers to participate in program There are no identifiable barriers or psychosocial needs.      Screening Interventions   Interventions Encouraged to exercise;To provide support and resources with identified psychosocial needs;Provide feedback about the scores to participant    Expected Outcomes Short Term goal: Utilizing psychosocial counselor, staff and physician to assist with identification of specific Stressors or current issues interfering with healing process. Setting desired goal for each stressor or current issue identified.;Long Term Goal: Stressors or current issues are controlled or eliminated.;Short Term goal: Identification and review with participant of any Quality of Life or Depression concerns found by scoring the questionnaire.;Long Term goal: The participant improves quality of Life and PHQ9 Scores as seen by post scores and/or verbalization of changes          Quality of Life Scores:   Quality of Life - 03/20/24 1044       Quality of Life   Select Quality of Life      Quality of Life Scores   Health/Function Pre 17.04 %    Socioeconomic Pre 23.75 %    Psych/Spiritual Pre 23.29 %    Family Pre 25.2 %    GLOBAL Pre 20.94 %         Scores of 19 and below  usually indicate a poorer quality of life in these areas.  A difference of  2-3 points is a clinically meaningful difference.  A difference of 2-3 points in the total score of the Quality of Life Index has been associated with significant improvement in overall quality of life, self-image, physical symptoms, and general health in studies assessing change in quality of life.  PHQ-9: Review Flowsheet       03/20/2024 02/05/2024 06/07/2018  Depression screen PHQ 2/9  Decreased Interest 0 0 0  Down, Depressed, Hopeless 0 0 0  PHQ - 2 Score 0 0 0  Altered sleeping 1 0 -  Tired, decreased energy 1 3 -  Change in appetite 0 0 -  Feeling bad or failure about yourself  0 0 -  Trouble concentrating 0 0 -  Moving slowly or fidgety/restless 0 0 -  Suicidal thoughts 0 0 -  PHQ-9 Score 2 3 -  Difficult doing work/chores Not difficult at all Somewhat difficult -   Interpretation of Total Score  Total Score Depression Severity:  1-4 = Minimal depression, 5-9 = Mild depression, 10-14 = Moderate depression, 15-19 = Moderately severe depression, 20-27 = Severe depression   Psychosocial Evaluation and Intervention:  Psychosocial Evaluation - 03/19/24 1347       Psychosocial Evaluation & Interventions   Interventions Encouraged to exercise with the program and follow exercise prescription    Comments There are no barriers to attending the program. He lives with his wife. She and his daughter are his support. DAughter comes to stay several a week.    He wants to get stronger and get back to normal living. He is ready to start the program.    Expected Outcomes STG attends all scheduled sessions, works on exercise progression as tolerated.  LTG continues with exercise progession and utlizing resources, education to maintain healthy lifestyle    Continue Psychosocial Services  Follow up required by staff          Psychosocial Re-Evaluation:  Psychosocial Re-Evaluation     Row Name 04/05/24 1118 04/29/24  1126           Psychosocial Re-Evaluation   Current issues with None Identified None Identified      Comments Patient reports no issues with their current mental states, sleep, stress, depression or anxiety. Will follow up with patient in a few weeks for any changes. Tom reports no issues with their current mental states, sleep, stress, depression or anxiety. Will follow up with patient in a few weeks for any changes.      Expected Outcomes Short: Continue to exercise regularly to support mental health and notify staff of any changes. Long: maintain mental health and well being through teaching of rehab or prescribed medications independently. STG: Continue to exercise regularly to support mental health and notify staff of any changes. LTG: maintain mental health and well being through teaching of rehab or prescribed medications independently.      Interventions Encouraged to attend Cardiac Rehabilitation for the exercise Encouraged to attend Cardiac Rehabilitation for the exercise      Continue Psychosocial Services  Follow up required by staff Follow up required by staff         Psychosocial Discharge (Final Psychosocial Re-Evaluation):  Psychosocial Re-Evaluation - 04/29/24 1126       Psychosocial Re-Evaluation   Current issues with None Identified    Comments Tom reports no issues with their current mental states, sleep, stress, depression or anxiety. Will follow up with patient in a few weeks for any changes.    Expected Outcomes STG: Continue to exercise regularly to support mental health and notify staff of any changes. LTG: maintain mental health and well being through teaching of rehab or prescribed medications independently.    Interventions Encouraged to attend Cardiac Rehabilitation for the exercise    Continue Psychosocial Services  Follow up required by staff          Vocational Rehabilitation: Provide vocational rehab assistance to qualifying candidates.   Vocational  Rehab Evaluation & Intervention:   Education: Education Goals: Education classes will be provided on a variety of topics geared toward better understanding of heart health and risk factor modification. Participant will state understanding/return demonstration of topics presented as noted by education test scores.  Learning Barriers/Preferences:  Learning Barriers/Preferences - 03/19/24 1329       Learning Barriers/Preferences   Learning Barriers Hearing   hearing aids         General Cardiac Education Topics:  AED/CPR: - Group verbal and written instruction with the use of models to demonstrate the basic use of the AED with the basic ABC's of resuscitation.   Anatomy and Cardiac Procedures: - Group verbal and visual presentation and models provide information about basic cardiac anatomy and function. Reviews the testing methods done to diagnose heart disease and the outcomes of the test results. Describes the treatment choices: Medical Management, Angioplasty, or Coronary Bypass Surgery for treating various heart conditions including Myocardial Infarction, Angina, Valve Disease, and Cardiac Arrhythmias.  Written material given at graduation. Flowsheet Row Cardiac Rehab from 05/08/2024 in [facility] Cardiac and Pulmonary Rehab  Date 04/03/24  Educator [employer]  Instruction Review Code 1- Verbalizes Understanding    Medication Safety: - Group verbal and visual instruction to review commonly prescribed medications for heart and lung disease. Reviews the medication, class of the drug, and side effects. Includes the steps to  properly store meds and maintain the prescription regimen.  Written material given at graduation. Flowsheet Row Cardiac Rehab from 05/08/2024 in [facility] Cardiac and Pulmonary Rehab  Date 04/10/24  Educator sb  Instruction Review Code 1- Verbalizes Understanding    Intimacy: - Group verbal instruction through game format to discuss how heart and lung disease can affect sexual  intimacy. Written material given at graduation..   Know Your Numbers and Heart Failure: - Group verbal and visual instruction to discuss disease risk factors for cardiac and pulmonary disease and treatment options.  Reviews associated critical values for Overweight/Obesity, Hypertension, Cholesterol, and Diabetes.  Discusses basics of heart failure: signs/symptoms and treatments.  Introduces Heart Failure Zone chart for action plan for heart failure.  Written material given at graduation. Flowsheet Row Cardiac Rehab from 05/08/2024 in [facility] Cardiac and Pulmonary Rehab  Date 04/17/24  Educator sb  Instruction Review Code 1- Verbalizes Understanding    Infection Prevention: - Provides verbal and written material to individual with discussion of infection control including proper hand washing and proper equipment cleaning during exercise session. Flowsheet Row Cardiac Rehab from 05/08/2024 in [facility] Cardiac and Pulmonary Rehab  Date 03/20/24  Educator MB  Instruction Review Code 1- Verbalizes Understanding    Falls Prevention: - Provides verbal and written material to individual with discussion of falls prevention and safety. Flowsheet Row Cardiac Rehab from 05/08/2024 in [facility] Cardiac and Pulmonary Rehab  Date 03/20/24  Educator MB  Instruction Review Code 1- Verbalizes Understanding    Other: -Provides group and verbal instruction on various topics (see comments)   Knowledge Questionnaire Score:  Knowledge Questionnaire Score - 03/20/24 1046       Knowledge Questionnaire Score   Pre Score 25/26          Core Components/Risk Factors/Patient Goals at Admission:  Personal Goals and Risk Factors at Admission - 03/20/24 1047       Core Components/Risk Factors/Patient Goals on Admission    Weight Management Yes;Weight Loss    Intervention Weight Management: Develop a combined nutrition and exercise program designed to reach desired caloric intake, while maintaining appropriate  intake of nutrient and fiber, sodium and fats, and appropriate energy expenditure required for the weight goal.;Weight Management: Provide education and appropriate resources to help participant work on and attain dietary goals.;Weight Management/Obesity: Establish reasonable short term and long term weight goals.    Admit Weight 194 lb 9.6 oz (88.3 kg)    Goal Weight: Short Term 189 lb (85.7 kg)    Goal Weight: Long Term 160 lb (72.6 kg)    Expected Outcomes Short Term: Continue to assess and modify interventions until short term weight is achieved;Long Term: Adherence to nutrition and physical activity/exercise program aimed toward attainment of established weight goal;Weight Loss: Understanding of general recommendations for a balanced deficit meal plan, which promotes 1-2 lb weight loss per week and includes a negative energy balance of [phone] kcal/d;Understanding recommendations for meals to include 15-35% energy as protein, 25-35% energy from fat, 35-60% energy from carbohydrates, less than 200mg  of dietary cholesterol, 20-35 gm of total fiber daily;Understanding of distribution of calorie intake throughout the day with the consumption of 4-5 meals/snacks    Hypertension Yes    Intervention Provide education on lifestyle modifcations including regular physical activity/exercise, weight management, moderate sodium restriction and increased consumption of fresh fruit, vegetables, and low fat dairy, alcohol moderation, and smoking cessation.;Monitor prescription use compliance.    Expected Outcomes Short Term: Continued assessment and intervention until BP is <  140/66mm HG in hypertensive participants. < 130/101mm HG in hypertensive participants with diabetes, heart failure or chronic kidney disease.;Long Term: Maintenance of blood pressure at goal levels.    Lipids Yes    Intervention Provide education and support for participant on nutrition & aerobic/resistive exercise along with prescribed  medications to achieve LDL 70mg , HDL >40mg .    Expected Outcomes Short Term: Participant states understanding of desired cholesterol values and is compliant with medications prescribed. Participant is following exercise prescription and nutrition guidelines.;Long Term: Cholesterol controlled with medications as prescribed, with individualized exercise RX and with personalized nutrition plan. Value goals: LDL < 70mg , HDL > 40 mg.          Education:Diabetes - Individual verbal and written instruction to review signs/symptoms of diabetes, desired ranges of glucose level fasting, after meals and with exercise. Acknowledge that pre and post exercise glucose checks will be done for 3 sessions at entry of program.   Core Components/Risk Factors/Patient Goals Review:   Goals and Risk Factor Review     Row Name 04/05/24 1120 04/29/24 1136           Core Components/Risk Factors/Patient Goals Review   Personal Goals Review Weight Management/Obesity Hypertension      Review Firmin wants to try to lose weight and has lose 2 pounds since starting the program. Informed him to try to lose around 5 pounds in the next few weeks.  He is meeting with the RD on June 30th about his nutrition. Encouraged Tom to check his BP at home and make sure it remains stable and matches closely with readings taken here at rehab      Expected Outcomes Short: lose 5 pounds in the next few weeks. Long: reach weight goal. STG: Check BP at home. LTG: Manage risk factors independently         Core Components/Risk Factors/Patient Goals at Discharge (Final Review):   Goals and Risk Factor Review - 04/29/24 1136       Core Components/Risk Factors/Patient Goals Review   Personal Goals Review Hypertension    Review Encouraged Tom to check his BP at home and make sure it remains stable and matches closely with readings taken here at rehab    Expected Outcomes STG: Check BP at home. LTG: Manage risk factors independently           ITP Comments:  ITP Comments     Row Name 03/19/24 1350 03/20/24 0955 03/20/24 1031 03/25/24 1116 04/17/24 0957   ITP Comments Virtual orientation call completed today. he has an appointment on Date: 03/20/2024  for EP eval and gym Orientation.  Documentation of diagnosis can be found in [facility] 01/04/2024 . 30 Day review completed. Medical Director ITP review done, changes made as directed, and signed approval by Medical Director.    new to program Completed and gym orientation for cardiac rehab. Initial ITP created and sent for review to Dr. Oneil Pinal, Medical Director. First full day of exercise!  Patient was oriented to gym and equipment including functions, settings, policies, and procedures.  Patient's individual exercise prescription and treatment plan were reviewed.  All starting workloads were established based on the results of the 6 minute walk test done at initial orientation visit.  The plan for exercise progression was also introduced and progression will be customized based on patient's performance and goals. 30 Day review completed. Medical Director ITP review done, changes made as directed, and signed approval by Medical Director. New to program  Row Name 05/15/24 0836           ITP Comments 30 Day review completed. Medical Director ITP review done, changes made as directed, and signed approval by Medical Director.          Comments: 30 day review

## 2024-05-17 ENCOUNTER — Encounter: Attending: Internal Medicine

## 2024-05-17 DIAGNOSIS — Z951 Presence of aortocoronary bypass graft: Secondary | ICD-10-CM | POA: Insufficient documentation

## 2024-05-17 DIAGNOSIS — Z48812 Encounter for surgical aftercare following surgery on the circulatory system: Secondary | ICD-10-CM | POA: Insufficient documentation

## 2024-05-17 NOTE — Progress Notes (Signed)
 Daily Session Note  Patient Details  Name: STEIN WINDHORST MRN: [id number] Date of Birth: Feb 23, 1946 Referring Provider:   Flowsheet Row Cardiac Rehab from 03/20/2024 in [facility] Cardiac and Pulmonary Rehab  Referring Provider End, Lonni, MD    Encounter Date: 05/17/2024  Check In:  Session Check In - 05/17/24 1102       Check-In   Supervising physician immediately available to respond to emergencies See telemetry face sheet for immediately available ER MD    Location [facility]-Cardiac & Pulmonary Rehab    Staff Present Josette Shallow RN,BC,MSN;Maxon Avoca BS, Exercise Physiologist;Noah Tickle, BS, Exercise Physiologist;Joseph Rolinda RCP,RRT,BSRT    Virtual Visit No    Medication changes reported     No    Fall or balance concerns reported    No    Warm-up and Cool-down Performed on first and last piece of equipment    Resistance Training Performed Yes    VAD Patient? No    PAD/SET Patient? No      Pain Assessment   Currently in Pain? No/denies    Multiple Pain Sites No             Social History   Tobacco Use  Smoking Status Never  Smokeless Tobacco Never    Goals Met:  Independence with exercise equipment Exercise tolerated well No report of concerns or symptoms today Strength training completed today  Goals Unmet:  Not Applicable  Comments: Pt able to follow exercise prescription today without complaint.  Will continue to monitor for progression.    Dr. Oneil Pinal is Medical Director for [facility] Cardiac Rehabilitation.  Dr. Fuad Aleskerov is Medical Director for [facility] Pulmonary Rehabilitation.

## 2024-05-20 ENCOUNTER — Encounter

## 2024-05-20 VITALS — Ht 67.64 in | Wt 196.0 lb

## 2024-05-20 DIAGNOSIS — Z951 Presence of aortocoronary bypass graft: Secondary | ICD-10-CM | POA: Diagnosis not present

## 2024-05-20 NOTE — Patient Instructions (Signed)
 Discharge Patient Instructions  Patient Details  Name: Tyler David MRN: [id number] Date of Birth: [birth year] Referring Provider:  Arloa Elsie SAUNDERS, MD   Number of Visits: 36  Reason for Discharge:  Patient reached a stable level of exercise. Patient independent in their exercise. Patient has met program and personal goals.  Smoking History:  Social History   Tobacco Use  Smoking Status Never  Smokeless Tobacco Never    Diagnosis:  S/P CABG x 3  Initial Exercise Prescription:  Initial Exercise Prescription - 03/20/24 1000       Date of Initial Exercise RX and Referring Provider   Date 03/20/24    Referring Provider End, Lonni, MD      Oxygen   Maintain Oxygen Saturation 88% or higher      Recumbant Bike   Level 2    RPM 50    Watts 25    Minutes 15    METs 2.32      NuStep   Level 2    SPM 80    Minutes 15    METs 2.32      Biostep-RELP   Level 2    SPM 50    Minutes 15    METs 2.32      Track   Laps 20    Minutes 15    METs 2.09      Prescription Details   Frequency (times per week) 3    Duration Progress to 30 minutes of continuous aerobic without signs/symptoms of physical distress      Intensity   THRR 40-80% of Max Heartrate 95-126    Ratings of Perceived Exertion 11-13    Perceived Dyspnea 0-4      Progression   Progression Continue to progress workloads to maintain intensity without signs/symptoms of physical distress.      Resistance Training   Training Prescription Yes    Weight 5 lb    Reps 10-15          Discharge Exercise Prescription (Final Exercise Prescription Changes):  Exercise Prescription Changes - 05/14/24 1400       Response to Exercise   Blood Pressure (Admit) 134/72    Blood Pressure (Exercise) 128/62    Blood Pressure (Exit) 132/64    Heart Rate (Admit) 72 bpm    Heart Rate (Exercise) 113 bpm    Heart Rate (Exit) 71 bpm    Rating of Perceived Exertion (Exercise) 13    Perceived Dyspnea  (Exercise) 1    Symptoms none    Duration Continue with 30 min of aerobic exercise without signs/symptoms of physical distress.    Intensity THRR unchanged      Progression   Progression Continue to progress workloads to maintain intensity without signs/symptoms of physical distress.    Average METs 2.26      Resistance Training   Training Prescription Yes    Weight 5 lb    Reps 10-15      Interval Training   Interval Training No      NuStep   Level 3    Minutes 15    METs 2.88      Biostep-RELP   Level 3    Minutes 15    METs 2      Track   Laps 27    Minutes 15    METs 2.47      Home Exercise Plan   Plans to continue exercise at Home (comment)   walking   Frequency  Add 2 additional days to program exercise sessions.    Initial Home Exercises Provided 05/01/24      Oxygen   Maintain Oxygen Saturation 88% or higher          Functional Capacity:  6 Minute Walk     Row Name 03/20/24 1032 05/20/24 1119       6 Minute Walk   Phase Initial Discharge    Distance 1035 feet 1140 feet    Distance % Change -- 9 %    Distance Feet Change -- 105 ft    Walk Time 6 minutes 6 minutes    # of Rest Breaks 0 0    MPH 1.96 2.16    METS 2.32 2.33    RPE 13 15    Perceived Dyspnea  2 3    VO2 Peak 8.11 8.14    Symptoms Yes (comment) Yes (comment)    Comments SOB SOB    Resting HR 64 bpm 73 bpm    Resting BP 150/70 136/72    Resting Oxygen Saturation  100 % --    Exercise Oxygen Saturation  during 6 min walk 99 % --    Max Ex. HR 104 bpm 118 bpm    Max Ex. BP 184/88 144/80    2 Minute Post BP 178/80 124/80        Nutrition & Weight - Outcomes:  Pre Biometrics - 03/20/24 1038       Pre Biometrics   Height 5' 7.64 (1.718 m)    Weight 194 lb 9.6 oz (88.3 kg)    Waist Circumference 44.5 inches    Hip Circumference 41.8 inches    Waist to Hip Ratio 1.06 %    BMI (Calculated) 29.91    Single Leg Stand 6 seconds          Post Biometrics - 05/20/24 1120         Post  Biometrics   Height 5' 7.64 (1.718 m)    Weight 196 lb (88.9 kg)    Waist Circumference 44 inches    Hip Circumference 42.5 inches    Waist to Hip Ratio 1.04 %    BMI (Calculated) 30.12    Single Leg Stand 3.97 seconds          Nutrition:  Nutrition Therapy & Goals - 04/15/24 1311       Nutrition Therapy   Diet Cardiac, Low Na    Protein (specify units) 90    Fiber 30 grams    Whole Grain Foods 3 servings    Saturated Fats 15 max. grams    Fruits and Vegetables 5 servings/day    Sodium 2 grams      Personal Nutrition Goals   Nutrition Goal Eat 15-30gProtein and 30-60gCarbs at each meal.    Personal Goal #2 Reduce saturated fat, less than 12g per day. Replace bad fats for more heart healthy fats.    Personal Goal #3 Read labels and reduce sodium intake to below 2300mg . Ideally 1500mg  per day.    Comments Patient drinking ~32-48oz water  daily. He typically eats 3 meals per day. Reports he likes fruits and veggies. Provided mediterranean diet handout. Educated on types of fats, sources, and how to read labels. He says he doesn't read labels often, instead prefers to eat whole foods and avoid processed foods. He does report a weakness for sugar. Brainstormed several meals and snacks with foods he likes at will eat, focusing on controlling sodium, saturated fat, and sugar  Intervention Plan   Intervention Nutrition handout(s) given to patient.;Prescribe, educate and counsel regarding individualized specific dietary modifications aiming towards targeted core components such as weight, hypertension, lipid management, diabetes, heart failure and other comorbidities.    Expected Outcomes Short Term Goal: Understand basic principles of dietary content, such as calories, fat, sodium, cholesterol and nutrients.;Short Term Goal: A plan has been developed with personal nutrition goals set during dietitian appointment.;Long Term Goal: Adherence to prescribed nutrition plan.           Nutrition Discharge:   Education Questionnaire Score:  Knowledge Questionnaire Score - 03/20/24 1046       Knowledge Questionnaire Score   Pre Score 25/26          Goals reviewed with patient; copy given to patient.

## 2024-05-20 NOTE — Progress Notes (Signed)
 Daily Session Note  Patient Details  Name: Tyler David MRN: [id number] Date of Birth: 1946-10-17 Referring Provider:   Flowsheet Row Cardiac Rehab from 03/20/2024 in [facility] Cardiac and Pulmonary Rehab  Referring Provider End, Lonni, MD    Encounter Date: 05/20/2024  Check In:  Session Check In - 05/20/24 1051       Check-In   Supervising physician immediately available to respond to emergencies See telemetry face sheet for immediately available ER MD    Location [facility]-Cardiac & Pulmonary Rehab    Staff Present Burnard Davenport RN,BSN,MPA;Joseph [facility] BS, ACSM CEP, Exercise Physiologist;Jason Elnor RDN,LDN    Virtual Visit No    Medication changes reported     No    Fall or balance concerns reported    No    Warm-up and Cool-down Performed on first and last piece of equipment    Resistance Training Performed Yes    VAD Patient? No    PAD/SET Patient? No      Pain Assessment   Currently in Pain? No/denies             Social History   Tobacco Use  Smoking Status Never  Smokeless Tobacco Never    Goals Met:  Independence with exercise equipment Exercise tolerated well No report of concerns or symptoms today Strength training completed today  Goals Unmet:  Not Applicable  Comments: Pt able to follow exercise prescription today without complaint.  Will continue to monitor for progression.   6 Minute Walk     Row Name 03/20/24 1032 05/20/24 1119       6 Minute Walk   Phase Initial Discharge    Distance 1035 feet 1140 feet    Distance % Change -- 9 %    Distance Feet Change -- 105 ft    Walk Time 6 minutes 6 minutes    # of Rest Breaks 0 0    MPH 1.96 2.16    METS 2.32 2.33    RPE 13 15    Perceived Dyspnea  2 3    VO2 Peak 8.11 8.14    Symptoms Yes (comment) Yes (comment)    Comments SOB SOB    Resting HR 64 bpm 73 bpm    Resting BP 150/70 136/72    Resting Oxygen Saturation  100 % --    Exercise Oxygen Saturation  during  6 min walk 99 % --    Max Ex. HR 104 bpm 118 bpm    Max Ex. BP 184/88 144/80    2 Minute Post BP 178/80 124/80         Dr. Oneil Pinal is Medical Director for [facility] Cardiac Rehabilitation.  Dr. Fuad Aleskerov is Medical Director for [facility] Pulmonary Rehabilitation.

## 2024-05-22 ENCOUNTER — Encounter

## 2024-05-22 DIAGNOSIS — Z951 Presence of aortocoronary bypass graft: Secondary | ICD-10-CM

## 2024-05-22 NOTE — Progress Notes (Signed)
 Daily Session Note  Patient Details  Name: Tyler David MRN: [id number] Date of Birth: [birth year] Referring Provider:   Flowsheet Row Cardiac Rehab from 03/20/2024 in [facility] Cardiac and Pulmonary Rehab  Referring Provider End, Lonni, MD    Encounter Date: 05/22/2024  Check In:  Session Check In - 05/22/24 1056       Check-In   Supervising physician immediately available to respond to emergencies See telemetry face sheet for immediately available ER MD    Location [facility]-Cardiac & Pulmonary Rehab    Staff Present Burnard Davenport RN,BSN,MPA;Joseph [facility] RCP,RRT,BSRT;Margaret Best, MS, Exercise Physiologist;Jason Elnor RDN,LDN    Virtual Visit No    Medication changes reported     No    Fall or balance concerns reported    No    Warm-up and Cool-down Performed on first and last piece of equipment    Resistance Training Performed Yes    VAD Patient? No    PAD/SET Patient? No      Pain Assessment   Currently in Pain? No/denies             Social History   Tobacco Use  Smoking Status Never  Smokeless Tobacco Never    Goals Met:  Independence with exercise equipment Exercise tolerated well No report of concerns or symptoms today Strength training completed today  Goals Unmet:  Not Applicable  Comments: Pt able to follow exercise prescription today without complaint.  Will continue to monitor for progression.    Dr. Oneil Pinal is Medical Director for [facility] Cardiac Rehabilitation.  Dr. Fuad Aleskerov is Medical Director for [facility] Pulmonary Rehabilitation.

## 2024-05-24 ENCOUNTER — Encounter: Admitting: *Deleted

## 2024-05-24 DIAGNOSIS — Z951 Presence of aortocoronary bypass graft: Secondary | ICD-10-CM | POA: Diagnosis not present

## 2024-05-24 NOTE — Progress Notes (Signed)
 Daily Session Note  Patient Details  Name: Tyler David MRN: [id number] Date of Birth: 1946/03/13 Referring Provider:   Flowsheet Row Cardiac Rehab from 03/20/2024 in [facility] Cardiac and Pulmonary Rehab  Referring Provider End, Lonni, MD    Encounter Date: 05/24/2024  Check In:  Session Check In - 05/24/24 1118       Check-In   Supervising physician immediately available to respond to emergencies See telemetry face sheet for immediately available ER MD    Location [facility]-Cardiac & Pulmonary Rehab    Staff Present Othel Durand, RN, BSN, CCRP;Joseph Hood RCP,RRT,BSRT;Meredith Craven RN,BSN;Maxon Smithfield BS, Exercise Physiologist    Virtual Visit No    Medication changes reported     No    Fall or balance concerns reported    No    Warm-up and Cool-down Performed on first and last piece of equipment    Resistance Training Performed Yes    VAD Patient? No    PAD/SET Patient? No      Pain Assessment   Currently in Pain? No/denies             Social History   Tobacco Use  Smoking Status Never  Smokeless Tobacco Never    Goals Met:  Independence with exercise equipment Exercise tolerated well No report of concerns or symptoms today  Goals Unmet:  Not Applicable  Comments: Pt able to follow exercise prescription today without complaint.  Will continue to monitor for progression.    Dr. Oneil Pinal is Medical Director for [facility] Cardiac Rehabilitation.  Dr. Fuad Aleskerov is Medical Director for [facility] Pulmonary Rehabilitation.

## 2024-05-27 ENCOUNTER — Encounter

## 2024-05-27 DIAGNOSIS — Z951 Presence of aortocoronary bypass graft: Secondary | ICD-10-CM

## 2024-05-27 NOTE — Progress Notes (Signed)
 Cardiac Individual Treatment Plan  Patient Details  Name: Tyler David MRN: [id number] Date of Birth: June 27, 1946 Referring Provider:   Flowsheet Row Cardiac Rehab from 03/20/2024 in [facility] Cardiac and Pulmonary Rehab  Referring Provider End, Lonni, MD    Initial Encounter Date:  Flowsheet Row Cardiac Rehab from 03/20/2024 in [facility] Cardiac and Pulmonary Rehab  Date 03/20/24    Visit Diagnosis: S/P CABG x 3  Patient's Home Medications on Admission:  Current Outpatient Medications:    amLODipine  (NORVASC ) 5 MG tablet, Take 1 tablet (5 mg total) by mouth daily., Disp: 90 tablet, Rfl: 3   apixaban  (ELIQUIS ) 5 MG TABS tablet, Take 1 tablet (5 mg total) by mouth 2 (two) times daily., Disp: 180 tablet, Rfl: 3   aspirin  EC 81 MG tablet, Take 1 tablet (81 mg total) by mouth daily. Swallow whole., Disp: , Rfl:    Bempedoic Acid  (NEXLETOL ) 180 MG TABS, Take 1 tablet (180 mg total) by mouth daily., Disp: 90 tablet, Rfl: 3   bisacodyl  (DULCOLAX) 5 MG EC tablet, Take 2 tablets (10 mg total) by mouth daily. (Patient not taking: Reported on 03/14/2024), Disp: , Rfl:    cholecalciferol  (VITAMIN D3) 25 MCG (1000 UNIT) tablet, Take 1 tablet (1,000 Units total) by mouth daily., Disp: 30 tablet, Rfl: 0   diclofenac  Sodium (VOLTAREN ) 1 % GEL, Apply 2 g topically 4 (four) times daily. (Patient not taking: Reported on 03/14/2024), Disp: 100 g, Rfl: 0   docusate sodium  (COLACE) 100 MG capsule, Take 2 capsules (200 mg total) by mouth daily., Disp: , Rfl:    ezetimibe  (ZETIA ) 10 MG tablet, Take 1 tablet (10 mg total) by mouth daily., Disp: 90 tablet, Rfl: 3   famotidine  (PEPCID ) 20 MG tablet, Take 1 tablet (20 mg total) by mouth every evening., Disp: 30 tablet, Rfl: 0   fluticasone  (FLONASE ) 50 MCG/ACT nasal spray, Place into both nostrils daily., Disp: , Rfl:    furosemide  (LASIX ) 40 MG tablet, Take 1 tablet (40 mg total) by mouth daily as needed (for increased shortness of breath, lower extremity swelling, or  weight gain greater than 3 pounds overnight or 5 pounds in a 7-day time span)., Disp: 30 tablet, Rfl: 6   levothyroxine  (SYNTHROID ) 50 MCG tablet, Take 1 tablet (50 mcg total) by mouth daily before breakfast., Disp: 30 tablet, Rfl: 0   loratadine  (CLARITIN ) 10 MG tablet, Take 1 tablet (10 mg total) by mouth daily. (Patient not taking: Reported on 03/14/2024), Disp: 30 tablet, Rfl: 0   MAGNESIUM  PO, Take 1 tablet by mouth daily., Disp: , Rfl:    melatonin 5 MG TABS, Take 1 tablet (5 mg total) by mouth at bedtime., Disp: 30 tablet, Rfl: 0   metoprolol  succinate (TOPROL -XL) 25 MG 24 hr tablet, Take 1 tablet (25 mg total) by mouth daily. Take with or immediately following a meal., Disp: 90 tablet, Rfl: 3   Multiple Vitamins-Minerals (ICAPS AREDS 2 PO), Take 1 capsule by mouth 2 (two) times daily., Disp: , Rfl:    nitroGLYCERIN  (NITROSTAT ) 0.4 MG SL tablet, Place 1 tablet (0.4 mg total) under the tongue every 5 (five) minutes as needed for chest pain., Disp: 25 tablet, Rfl: 0   Omega-3 Fatty Acids (FISH OIL) 1000 MG CAPS, Take 1,000 mg by mouth daily. (Patient not taking: Reported on 02/19/2024), Disp: , Rfl:    Turmeric 500 MG CAPS, Take 500 mg by mouth 2 (two) times daily. (Patient not taking: Reported on 02/19/2024), Disp: , Rfl:   Past  Medical History: Past Medical History:  Diagnosis Date   Arthritis    Cancer (HCC)    prostate cancer    Cardiomyopathy (HCC)    Chronic kidney disease, stage 3 (HCC)    Coronary artery disease    ED (erectile dysfunction)    GERD (gastroesophageal reflux disease)    Headache    hx of migraine-2015    Hyperlipidemia    Hypertension    Hypothyroidism    LBBB (left bundle branch block)    Migraine    Nocturia    Overactive bladder    Pneumonia    hx of at age 70     Tobacco Use: Social History   Tobacco Use  Smoking Status Never  Smokeless Tobacco Never    Labs: Review Flowsheet  More data exists      Latest Ref Rng & Units 12/15/2023 01/02/2024  01/04/2024 01/05/2024 01/07/2024  Labs for ITP Cardiac and Pulmonary Rehab  Cholestrol 0 - 200 mg/dL - - - - 829   LDL (calc) 0 - 99 mg/dL - - - - 887   HDL-C >59 mg/dL - - - - 26   Trlycerides <150 mg/dL - - - - 840   Hemoglobin A1c 4.8 - 5.6 % - 5.4  - - -  PH, Arterial 7.35 - 7.45 7.347  - 7.306  7.337  7.308  7.348  7.355  7.372  7.345  7.302  - -  PCO2 arterial 32 - 48 mmHg 41.8  - 34.8  41.4  33.2  38.3  36.2  39.3  38.8  44.6  - -  Bicarbonate 20.0 - 28.0 mmol/L 22.9  24.3  - 17.3  22.1  16.7  21.2  20.2  22.8  21.8  21.2  22.1  - -  TCO2 22 - 32 mmol/L 24  26  - 18  23  18  22  21  21  24  24  23  22  23  22  23   - -  Acid-base deficit 0.0 - 2.0 mmol/L 3.0  2.0  - 8.0  3.0  9.0  4.0  5.0  2.0  4.0  4.0  4.0  - -  O2 Saturation % 91  66  - 92  92  91  92  100  100  87  100  100  75.7  -    Details       Multiple values from one day are sorted in reverse-chronological order          Exercise Target Goals: Exercise Program Goal: Individual exercise prescription set using results from initial 6 min walk test and THRR while considering  patient's activity barriers and safety.   Exercise Prescription Goal: Initial exercise prescription builds to 30-45 minutes a day of aerobic activity, 2-3 days per week.  Home exercise guidelines will be given to patient during program as part of exercise prescription that the participant will acknowledge.   Education: Aerobic Exercise: - Group verbal and visual presentation on the components of exercise prescription. Introduces F.I.T.T principle from ACSM for exercise prescriptions.  Reviews F.I.T.T. principles of aerobic exercise including progression. Written material provided at class time. Flowsheet Row Cardiac Rehab from 05/22/2024 in [facility] Cardiac and Pulmonary Rehab  Date 05/22/24  Educator nt  Instruction Review Code 1- TEFL teacher Understanding    Education: Resistance Exercise: - Group verbal and visual presentation on the components of  exercise prescription. Introduces F.I.T.T principle from ACSM for exercise prescriptions  Reviews F.I.T.T. principles of resistance exercise including progression. Written material provided at class time. Flowsheet Row Cardiac Rehab from 05/22/2024 in [facility] Cardiac and Pulmonary Rehab  Date 05/15/24  Educator nt  Instruction Review Code 1- TEFL teacher Understanding     Education: Exercise & Equipment Safety: - Individual verbal instruction and demonstration of equipment use and safety with use of the equipment. Flowsheet Row Cardiac Rehab from 05/22/2024 in [facility] Cardiac and Pulmonary Rehab  Date 03/20/24  Educator MB  Instruction Review Code 1- Verbalizes Understanding    Education: Exercise Physiology & General Exercise Guidelines: - Group verbal and written instruction with models to review the exercise physiology of the cardiovascular system and associated critical values. Provides general exercise guidelines with specific guidelines to those with heart or lung disease. Written material provided at class time. Flowsheet Row Cardiac Rehab from 05/22/2024 in [facility] Cardiac and Pulmonary Rehab  Date 05/08/24  Educator nt  Instruction Review Code 1- TEFL teacher Understanding    Education: Flexibility, Balance, Mind/Body Relaxation: - Group verbal and visual presentation with interactive activity on the components of exercise prescription. Introduces F.I.T.T principle from ACSM for exercise prescriptions. Reviews F.I.T.T. principles of flexibility and balance exercise training including progression. Also discusses the mind body connection.  Reviews various relaxation techniques to help reduce and manage stress (i.e. Deep breathing, progressive muscle relaxation, and visualization). Balance handout provided to take home. Written material provided at class time. Flowsheet Row Cardiac Rehab from 05/22/2024 in [facility] Cardiac and Pulmonary Rehab  Date 05/15/24  Educator nt  Instruction Review Code 1-  Verbalizes Understanding    Activity Barriers & Risk Stratification:  Activity Barriers & Cardiac Risk Stratification - 03/20/24 1034       Activity Barriers & Cardiac Risk Stratification   Activity Barriers Joint Problems;Arthritis;Left Hip Replacement   L knee   Cardiac Risk Stratification High          6 Minute Walk:  6 Minute Walk     Row Name 03/20/24 1032 05/20/24 1119       6 Minute Walk   Phase Initial Discharge    Distance 1035 feet 1140 feet    Distance % Change -- 9 %    Distance Feet Change -- 105 ft    Walk Time 6 minutes 6 minutes    # of Rest Breaks 0 0    MPH 1.96 2.16    METS 2.32 2.33    RPE 13 15    Perceived Dyspnea  2 3    VO2 Peak 8.11 8.14    Symptoms Yes (comment) Yes (comment)    Comments SOB SOB    Resting HR 64 bpm 73 bpm    Resting BP 150/70 136/72    Resting Oxygen Saturation  100 % --    Exercise Oxygen Saturation  during 6 min walk 99 % --    Max Ex. HR 104 bpm 118 bpm    Max Ex. BP 184/88 144/80    2 Minute Post BP 178/80 124/80       Oxygen Initial Assessment:   Oxygen Re-Evaluation:   Oxygen Discharge (Final Oxygen Re-Evaluation):   Initial Exercise Prescription:  Initial Exercise Prescription - 03/20/24 1000       Date of Initial Exercise RX and Referring Provider   Date 03/20/24    Referring Provider End, Lonni, MD      Oxygen   Maintain Oxygen Saturation 88% or higher      Recumbant Bike   Level 2  RPM 50    Watts 25    Minutes 15    METs 2.32      NuStep   Level 2    SPM 80    Minutes 15    METs 2.32      Biostep-RELP   Level 2    SPM 50    Minutes 15    METs 2.32      Track   Laps 20    Minutes 15    METs 2.09      Prescription Details   Frequency (times per week) 3    Duration Progress to 30 minutes of continuous aerobic without signs/symptoms of physical distress      Intensity   THRR 40-80% of Max Heartrate 95-126    Ratings of Perceived Exertion 11-13    Perceived Dyspnea  0-4      Progression   Progression Continue to progress workloads to maintain intensity without signs/symptoms of physical distress.      Resistance Training   Training Prescription Yes    Weight 5 lb    Reps 10-15          Perform Capillary Blood Glucose checks as needed.  Exercise Prescription Changes:   Exercise Prescription Changes     Row Name 03/20/24 1000 04/04/24 1100 04/17/24 1500 05/01/24 1100 05/01/24 1400     Response to Exercise   Blood Pressure (Admit) 150/70 146/80 142/76 -- 122/70   Blood Pressure (Exercise) 184/88 156/76 168/72 -- --   Blood Pressure (Exit) 138/70 112/68 122/60 -- 128/68   Heart Rate (Admit) 64 bpm 99 bpm 77 bpm -- 69 bpm   Heart Rate (Exercise) 104 bpm 116 bpm 118 bpm -- 111 bpm   Heart Rate (Exit) 68 bpm 86 bpm 97 bpm -- 71 bpm   Oxygen Saturation (Admit) 100 % -- -- -- --   Oxygen Saturation (Exercise) 99 % -- -- -- --   Oxygen Saturation (Exit) 100 % -- -- -- --   Rating of Perceived Exertion (Exercise) 13 15 15  -- 14   Perceived Dyspnea (Exercise) 2 -- -- -- --   Symptoms SOB none none -- none   Comments results First two weeks of exercise -- -- --   Duration -- Progress to 30 minutes of  aerobic without signs/symptoms of physical distress Progress to 30 minutes of  aerobic without signs/symptoms of physical distress Progress to 30 minutes of  aerobic without signs/symptoms of physical distress Continue with 30 min of aerobic exercise without signs/symptoms of physical distress.   Intensity THRR New THRR unchanged THRR unchanged THRR unchanged THRR unchanged     Progression   Progression -- Continue to progress workloads to maintain intensity without signs/symptoms of physical distress. Continue to progress workloads to maintain intensity without signs/symptoms of physical distress. Continue to progress workloads to maintain intensity without signs/symptoms of physical distress. Continue to progress workloads to maintain intensity  without signs/symptoms of physical distress.   Average METs 2.32 2.07 2.26 2.26 2.45     Resistance Training   Training Prescription -- Yes Yes Yes Yes   Weight -- 3 lb 3 lb 3 lb 5 lb   Reps -- 10-15 10-15 10-15 10-15     Interval Training   Interval Training -- No No No No     NuStep   Level -- 2 2 2 3    Minutes -- 15 15 15 15    METs -- 2.6 2.3 2.3 2.7  T5 Nustep   Level -- -- 2 2 --   Minutes -- -- 15 15 --   METs -- -- 1.9 1.9 --     Biostep-RELP   Level -- -- 2 2 3    Minutes -- -- 15 15 15    METs -- -- 2 2 3      Track   Laps -- 20 25 25  36   Minutes -- 15 15 15 15    METs -- 2.09 2.36 2.36 2.96     Home Exercise Plan   Plans to continue exercise at -- -- -- Home (comment)  walking Home (comment)  walking   Frequency -- -- -- Add 2 additional days to program exercise sessions. Add 2 additional days to program exercise sessions.   Initial Home Exercises Provided -- -- -- 05/01/24 05/01/24     Oxygen   Maintain Oxygen Saturation -- 88% or higher 88% or higher 88% or higher 88% or higher    Row Name 05/14/24 1400             Response to Exercise   Blood Pressure (Admit) 134/72       Blood Pressure (Exercise) 128/62       Blood Pressure (Exit) 132/64       Heart Rate (Admit) 72 bpm       Heart Rate (Exercise) 113 bpm       Heart Rate (Exit) 71 bpm       Rating of Perceived Exertion (Exercise) 13       Perceived Dyspnea (Exercise) 1       Symptoms none       Duration Continue with 30 min of aerobic exercise without signs/symptoms of physical distress.       Intensity THRR unchanged         Progression   Progression Continue to progress workloads to maintain intensity without signs/symptoms of physical distress.       Average METs 2.26         Resistance Training   Training Prescription Yes       Weight 5 lb       Reps 10-15         Interval Training   Interval Training No         NuStep   Level 3       Minutes 15       METs 2.88          Biostep-RELP   Level 3       Minutes 15       METs 2         Track   Laps 27       Minutes 15       METs 2.47         Home Exercise Plan   Plans to continue exercise at Home (comment)  walking       Frequency Add 2 additional days to program exercise sessions.       Initial Home Exercises Provided 05/01/24         Oxygen   Maintain Oxygen Saturation 88% or higher          Exercise Comments:   Exercise Comments     Row Name 03/25/24 1116 05/27/24 1113         Exercise Comments First full day of exercise!  Patient was oriented to gym and equipment including functions, settings, policies, and procedures.  Patient's individual exercise prescription and treatment plan were  reviewed.  All starting workloads were established based on the results of the 6 minute walk test done at initial orientation visit.  The plan for exercise progression was also introduced and progression will be customized based on patient's performance and goals. Scotty graduated today from  rehab with 36 sessions completed.  Details of the patient's exercise prescription and what He needs to do in order to continue the prescription and progress were discussed with patient.  Patient was given a copy of prescription and goals.  Patient verbalized understanding. Bhavesh plans to continue to exercise by walking at home.         Exercise Goals and Review:   Exercise Goals     Row Name 03/20/24 1037             Exercise Goals   Increase Physical Activity Yes       Intervention Provide advice, education, support and counseling about physical activity/exercise needs.;Develop an individualized exercise prescription for aerobic and resistive training based on initial evaluation findings, risk stratification, comorbidities and participant's personal goals.       Expected Outcomes Short Term: Attend rehab on a regular basis to increase amount of physical activity.;Long Term: Add in home exercise to make exercise part  of routine and to increase amount of physical activity.;Long Term: Exercising regularly at least 3-5 days a week.       Increase Strength and Stamina Yes       Intervention Provide advice, education, support and counseling about physical activity/exercise needs.;Develop an individualized exercise prescription for aerobic and resistive training based on initial evaluation findings, risk stratification, comorbidities and participant's personal goals.       Expected Outcomes Short Term: Increase workloads from initial exercise prescription for resistance, speed, and METs.;Short Term: Perform resistance training exercises routinely during rehab and add in resistance training at home;Long Term: Improve cardiorespiratory fitness, muscular endurance and strength as measured by increased METs and functional capacity ( )       Able to understand and use rate of perceived exertion (RPE) scale Yes       Intervention Provide education and explanation on how to use RPE scale       Expected Outcomes Short Term: Able to use RPE daily in rehab to express subjective intensity level;Long Term:  Able to use RPE to guide intensity level when exercising independently       Able to understand and use Dyspnea scale Yes       Intervention Provide education and explanation on how to use Dyspnea scale       Expected Outcomes Short Term: Able to use Dyspnea scale daily in rehab to express subjective sense of shortness of breath during exertion;Long Term: Able to use Dyspnea scale to guide intensity level when exercising independently       Knowledge and understanding of Target Heart Rate Range (THRR) Yes       Intervention Provide education and explanation of THRR including how the numbers were predicted and where they are located for reference       Expected Outcomes Short Term: Able to state/look up THRR;Short Term: Able to use daily as guideline for intensity in rehab;Long Term: Able to use THRR to govern intensity when  exercising independently       Able to check pulse independently Yes       Intervention Provide education and demonstration on how to check pulse in carotid and radial arteries.;Review the importance of being able to check your own pulse  for safety during independent exercise       Expected Outcomes Short Term: Able to explain why pulse checking is important during independent exercise;Long Term: Able to check pulse independently and accurately       Understanding of Exercise Prescription Yes       Intervention Provide education, explanation, and written materials on patient's individual exercise prescription       Expected Outcomes Short Term: Able to explain program exercise prescription;Long Term: Able to explain home exercise prescription to exercise independently          Exercise Goals Re-Evaluation :  Exercise Goals Re-Evaluation     Row Name 03/25/24 1116 04/04/24 1135 04/17/24 1600 04/29/24 1124 05/01/24 1139     Exercise Goal Re-Evaluation   Exercise Goals Review Able to understand and use rate of perceived exertion (RPE) scale;Able to understand and use Dyspnea scale;Knowledge and understanding of Target Heart Rate Range (THRR);Understanding of Exercise Prescription Increase Physical Activity;Increase Strength and Stamina;Understanding of Exercise Prescription Increase Physical Activity;Increase Strength and Stamina;Understanding of Exercise Prescription Increase Physical Activity;Increase Strength and Stamina;Understanding of Exercise Prescription Increase Physical Activity;Able to understand and use rate of perceived exertion (RPE) scale;Knowledge and understanding of Target Heart Rate Range (THRR);Understanding of Exercise Prescription;Increase Strength and Stamina;Able to understand and use Dyspnea scale;Able to check pulse independently   Comments Reviewed RPE and dyspnea scale, THR and program prescription with pt today.  Pt voiced understanding and was given a copy of goals to take  home. Charlena is off to a good start in the program. He did well walking on the track and reached up to 20 laps. He also did well at level 2 on the T4 nustep and used 3 lb handweights for resistance training. We will continue to monitor his progress in the program. Charlena is doing well in rehab. He has recently been able to increase his track laps from 20-25. He was also able to maintain level 2 on the T4 nuste, T5 nustep, and biostep. We will continue to monitor his progress in the program. Charlena is doing well in rehab, currently on level 3 on biostep. He is not doing much exercise at home. Spoke with Charlena about [facility] and looking into attending once he finishes rehab. Reviewed home exercise with pt today.  Pt plans to walk for exercise.  Reviewed THR, pulse, RPE, sign and symptoms, pulse oximetery and when to call 911 or MD.  Also discussed weather considerations and indoor options.  Pt voiced understanding.   Expected Outcomes Short: Use RPE daily to regulate intensity. Long: Follow program prescription in THR. Short: Continue to follow current exercise prescription. Long: Continue exercise to improve strength and stamina. Short: Continue to follow exercise prescription. Long: Continue exercise to improve strength and stamina. STG: Look into [facility]. LTG: Continue exercise to improve strength and stamina. Short: add 1-2 days a week of exercise at home on off days of cardiac rehab. Long: maintain independent exercise routine upon graduation from cardiac rehab.    Row Name 05/01/24 1432 05/14/24 1442 05/22/24 1116         Exercise Goal Re-Evaluation   Exercise Goals Review Increase Physical Activity;Understanding of Exercise Prescription;Increase Strength and Stamina Increase Physical Activity;Understanding of Exercise Prescription;Increase Strength and Stamina Increase Physical Activity;Increase Strength and Stamina     Comments Charlena continues to do well in rehab. He increased his laps on the track to 36 laps. He  also increased to level 3 on the T4 nustep and biostep and increased his  handweights to 5 lbs. We will continue to monitor his progress in the program. Charlena continues to do well in rehab. He maintained level 3 on the T4 nustep and biostep. He decreased his laps slightly to 27 from 36 on the track. We will continue to monitor his progress in the program. Charlena has done well in the program and wants to continue to workout post [facility]. He is thinking of going to the [facility].     Expected Outcomes Short: Continue to progressively increase track, nustep, and biostep workloads. Long: Continue exercise to improve strength and stamina. Short: Push for more laps on the track to get back to 36. Long: Continue exercise to improve strength and stamina. Short: Educational psychologist. Long: Workout at the [facility].        Discharge Exercise Prescription (Final Exercise Prescription Changes):  Exercise Prescription Changes - 05/14/24 1400       Response to Exercise   Blood Pressure (Admit) 134/72    Blood Pressure (Exercise) 128/62    Blood Pressure (Exit) 132/64    Heart Rate (Admit) 72 bpm    Heart Rate (Exercise) 113 bpm    Heart Rate (Exit) 71 bpm    Rating of Perceived Exertion (Exercise) 13    Perceived Dyspnea (Exercise) 1    Symptoms none    Duration Continue with 30 min of aerobic exercise without signs/symptoms of physical distress.    Intensity THRR unchanged      Progression   Progression Continue to progress workloads to maintain intensity without signs/symptoms of physical distress.    Average METs 2.26      Resistance Training   Training Prescription Yes    Weight 5 lb    Reps 10-15      Interval Training   Interval Training No      NuStep   Level 3    Minutes 15    METs 2.88      Biostep-RELP   Level 3    Minutes 15    METs 2      Track   Laps 27    Minutes 15    METs 2.47      Home Exercise Plan   Plans to continue exercise at Home (comment)   walking   Frequency  Add 2 additional days to program exercise sessions.    Initial Home Exercises Provided 05/01/24      Oxygen   Maintain Oxygen Saturation 88% or higher          Nutrition:  Target Goals: Understanding of nutrition guidelines, daily intake of sodium 1500mg , cholesterol 200mg , calories 30% from fat and 7% or less from saturated fats, daily to have 5 or more servings of fruits and vegetables.  Education: Nutrition 1 -Group instruction provided by verbal, written material, interactive activities, discussions, models, and posters to present general guidelines for heart healthy nutrition including macronutrients, label reading, and promoting whole foods over processed counterparts. Education serves as Pensions consultant of discussion of heart healthy eating for all. Written material provided at class time.    Education: Nutrition 2 -Group instruction provided by verbal, written material, interactive activities, discussions, models, and posters to present general guidelines for heart healthy nutrition including sodium, cholesterol, and saturated fat. Providing guidance of habit forming to improve blood pressure, cholesterol, and body weight. Written material provided at class time.     Biometrics:  Pre Biometrics - 03/20/24 1038       Pre Biometrics   Height 5' 7.64 (1.718 m)  Weight 194 lb 9.6 oz (88.3 kg)    Waist Circumference 44.5 inches    Hip Circumference 41.8 inches    Waist to Hip Ratio 1.06 %    BMI (Calculated) 29.91    Single Leg Stand 6 seconds          Post Biometrics - 05/20/24 1120        Post  Biometrics   Height 5' 7.64 (1.718 m)    Weight 196 lb (88.9 kg)    Waist Circumference 44 inches    Hip Circumference 42.5 inches    Waist to Hip Ratio 1.04 %    BMI (Calculated) 30.12    Single Leg Stand 3.97 seconds          Nutrition Therapy Plan and Nutrition Goals:  Nutrition Therapy & Goals - 04/15/24 1311       Nutrition Therapy   Diet Cardiac, Low Na     Protein (specify units) 90    Fiber 30 grams    Whole Grain Foods 3 servings    Saturated Fats 15 max. grams    Fruits and Vegetables 5 servings/day    Sodium 2 grams      Personal Nutrition Goals   Nutrition Goal Eat 15-30gProtein and 30-60gCarbs at each meal.    Personal Goal #2 Reduce saturated fat, less than 12g per day. Replace bad fats for more heart healthy fats.    Personal Goal #3 Read labels and reduce sodium intake to below 2300mg . Ideally 1500mg  per day.    Comments Patient drinking ~32-48oz water  daily. He typically eats 3 meals per day. Reports he likes fruits and veggies. Provided mediterranean diet handout. Educated on types of fats, sources, and how to read labels. He says he doesn't read labels often, instead prefers to eat whole foods and avoid processed foods. He does report a weakness for sugar. Brainstormed several meals and snacks with foods he likes at will eat, focusing on controlling sodium, saturated fat, and sugar      Intervention Plan   Intervention Nutrition handout(s) given to patient.;Prescribe, educate and counsel regarding individualized specific dietary modifications aiming towards targeted core components such as weight, hypertension, lipid management, diabetes, heart failure and other comorbidities.    Expected Outcomes Short Term Goal: Understand basic principles of dietary content, such as calories, fat, sodium, cholesterol and nutrients.;Short Term Goal: A plan has been developed with personal nutrition goals set during dietitian appointment.;Long Term Goal: Adherence to prescribed nutrition plan.          Nutrition Assessments:  MEDIFICTS Score Key: >=70 Need to make dietary changes  40-70 Heart Healthy Diet <= 40 Therapeutic Level Cholesterol Diet  Flowsheet Row Cardiac Rehab from 05/22/2024 in [facility] Cardiac and Pulmonary Rehab  Picture Your Plate Total Score on Admission 71  Picture Your Plate Total Score on Discharge 68   Picture Your Plate  Scores: <59 Unhealthy dietary pattern with much room for improvement. 41-50 Dietary pattern unlikely to meet recommendations for good health and room for improvement. 51-60 More healthful dietary pattern, with some room for improvement.  >60 Healthy dietary pattern, although there may be some specific behaviors that could be improved.    Nutrition Goals Re-Evaluation:  Nutrition Goals Re-Evaluation     Row Name 04/05/24 1117 04/29/24 1135 05/22/24 1110         Goals   Nutrition Goal Has meeting with RD on 6/30 -- --     Comment -- Spoke with Charlena about finding ways  to include more protein without overconsuming in saturated fat. He was looking into protein shakes and discussed some good options to look into. He is drinking ~48oz of water  daily Charlena is trying to intake more water  with his diet and it is going ok. He is getting about 2-3 bottles of water  daily. He has cut back some on his food intake. When he feels like he is lacking energy he tends to eat more. Informed him to listen to his hunger cues and watch the amount of calories he is intaking.     Expected Outcome Short: Meet with RD. Long: adhere to a diet that pertains to him. STG: Drink 48-64oz of water  daily. LTG: follow a heart healthy diet Short: Continue to drink more water  and eat adequate calories. Long: continue to work on diet post [facility].        Nutrition Goals Discharge (Final Nutrition Goals Re-Evaluation):  Nutrition Goals Re-Evaluation - 05/22/24 1110       Goals   Comment Tom is trying to intake more water  with his diet and it is going ok. He is getting about 2-3 bottles of water  daily. He has cut back some on his food intake. When he feels like he is lacking energy he tends to eat more. Informed him to listen to his hunger cues and watch the amount of calories he is intaking.    Expected Outcome Short: Continue to drink more water  and eat adequate calories. Long: continue to work on diet post [facility].           Psychosocial: Target Goals: Acknowledge presence or absence of significant depression and/or stress, maximize coping skills, provide positive support system. Participant is able to verbalize types and ability to use techniques and skills needed for reducing stress and depression.   Education: Stress, Anxiety, and Depression - Group verbal and visual presentation to define topics covered.  Reviews how body is impacted by stress, anxiety, and depression.  Also discusses healthy ways to reduce stress and to treat/manage anxiety and depression. Written material provided at class time. Flowsheet Row Cardiac Rehab from 05/22/2024 in [facility] Cardiac and Pulmonary Rehab  Date 05/01/24  Educator sb  Instruction Review Code 1- [employer] Understanding    Education: Sleep Hygiene -Provides group verbal and written instruction about how sleep can affect your health.  Define sleep hygiene, discuss sleep cycles and impact of sleep habits. Review good sleep hygiene tips.   Initial Review & Psychosocial Screening:  Initial Psych Review & Screening - 03/19/24 1322       Initial Review   Current issues with None Identified      Family Dynamics   Good Support System? Yes   wife, daughter     Barriers   Psychosocial barriers to participate in program There are no identifiable barriers or psychosocial needs.      Screening Interventions   Interventions Encouraged to exercise;To provide support and resources with identified psychosocial needs;Provide feedback about the scores to participant    Expected Outcomes Short Term goal: Utilizing psychosocial counselor, staff and physician to assist with identification of specific Stressors or current issues interfering with healing process. Setting desired goal for each stressor or current issue identified.;Long Term Goal: Stressors or current issues are controlled or eliminated.;Short Term goal: Identification and review with participant of any Quality of Life or  Depression concerns found by scoring the questionnaire.;Long Term goal: The participant improves quality of Life and PHQ9 Scores as seen by post scores and/or verbalization of  changes          Quality of Life Scores:   Quality of Life - 05/22/24 1123       Quality of Life Scores   Health/Function Pre 17.04 %    Health/Function Post 14.07 %    Health/Function % Change -17.43 %    Socioeconomic Pre 23.75 %    Socioeconomic Post 23.56 %    Socioeconomic % Change  -0.8 %    Psych/Spiritual Pre 23.29 %    Psych/Spiritual Post 22 %    Psych/Spiritual % Change -5.54 %    Family Pre 25.2 %    Family Post 25.9 %    Family % Change 2.78 %    GLOBAL Pre 20.94 %    GLOBAL Post 19.51 %    GLOBAL % Change -6.83 %         Scores of 19 and below usually indicate a poorer quality of life in these areas.  A difference of  2-3 points is a clinically meaningful difference.  A difference of 2-3 points in the total score of the Quality of Life Index has been associated with significant improvement in overall quality of life, self-image, physical symptoms, and general health in studies assessing change in quality of life.  PHQ-9: Review Flowsheet       05/22/2024 03/20/2024 02/05/2024 06/07/2018  Depression screen PHQ 2/9  Decreased Interest 2 0 0 0  Down, Depressed, Hopeless 0 0 0 0  PHQ - 2 Score 2 0 0 0  Altered sleeping 0 1 0 -  Tired, decreased energy 3 1 3  -  Change in appetite 0 0 0 -  Feeling bad or failure about yourself  0 0 0 -  Trouble concentrating 0 0 0 -  Moving slowly or fidgety/restless 0 0 0 -  Suicidal thoughts 0 0 0 -  PHQ-9 Score 5 2 3  -  Difficult doing work/chores Somewhat difficult Not difficult at all Somewhat difficult -   Interpretation of Total Score  Total Score Depression Severity:  1-4 = Minimal depression, 5-9 = Mild depression, 10-14 = Moderate depression, 15-19 = Moderately severe depression, 20-27 = Severe depression   Psychosocial Evaluation and  Intervention:  Psychosocial Evaluation - 03/19/24 1347       Psychosocial Evaluation & Interventions   Interventions Encouraged to exercise with the program and follow exercise prescription    Comments There are no barriers to attending the program. He lives with his wife. She and his daughter are his support. DAughter comes to stay several a week.    He wants to get stronger and get back to normal living. He is ready to start the program.    Expected Outcomes STG attends all scheduled sessions, works on exercise progression as tolerated.  LTG continues with exercise progession and utlizing resources, education to maintain healthy lifestyle    Continue Psychosocial Services  Follow up required by staff          Psychosocial Re-Evaluation:  Psychosocial Re-Evaluation     Row Name 04/05/24 1118 04/29/24 1126 05/22/24 1113         Psychosocial Re-Evaluation   Current issues with None Identified None Identified None Identified     Comments Patient reports no issues with their current mental states, sleep, stress, depression or anxiety. Will follow up with patient in a few weeks for any changes. Tom reports no issues with their current mental states, sleep, stress, depression or anxiety. Will follow up with patient in  a few weeks for any changes. Tom reports no issues with their current mental states, sleep, stress, depression or anxiety. Will follow up with patient in a few weeks for any changes. He is going to be graduating [facility] and is going to continue to keep a healthy mental state.     Expected Outcomes Short: Continue to exercise regularly to support mental health and notify staff of any changes. Long: maintain mental health and well being through teaching of rehab or prescribed medications independently. STG: Continue to exercise regularly to support mental health and notify staff of any changes. LTG: maintain mental health and well being through teaching of rehab or prescribed  medications independently. --     Interventions Encouraged to attend Cardiac Rehabilitation for the exercise Encouraged to attend Cardiac Rehabilitation for the exercise Encouraged to attend Cardiac Rehabilitation for the exercise     Continue Psychosocial Services  Follow up required by staff Follow up required by staff No Follow up required        Psychosocial Discharge (Final Psychosocial Re-Evaluation):  Psychosocial Re-Evaluation - 05/22/24 1113       Psychosocial Re-Evaluation   Current issues with None Identified    Comments Tom reports no issues with their current mental states, sleep, stress, depression or anxiety. Will follow up with patient in a few weeks for any changes. He is going to be graduating [facility] and is going to continue to keep a healthy mental state.    Interventions Encouraged to attend Cardiac Rehabilitation for the exercise    Continue Psychosocial Services  No Follow up required          Vocational Rehabilitation: Provide vocational rehab assistance to qualifying candidates.   Vocational Rehab Evaluation & Intervention:   Education: Education Goals: Education classes will be provided on a variety of topics geared toward better understanding of heart health and risk factor modification. Participant will state understanding/return demonstration of topics presented as noted by education test scores.  Learning Barriers/Preferences:  Learning Barriers/Preferences - 03/19/24 1329       Learning Barriers/Preferences   Learning Barriers Hearing   hearing aids         General Cardiac Education Topics:  AED/CPR: - Group verbal and written instruction with the use of models to demonstrate the basic use of the AED with the basic ABC's of resuscitation.   Test and Procedures: - Group verbal and visual presentation and models provide information about basic cardiac anatomy and function. Reviews the testing methods done to diagnose heart disease and the  outcomes of the test results. Describes the treatment choices: Medical Management, Angioplasty, or Coronary Bypass Surgery for treating various heart conditions including Myocardial Infarction, Angina, Valve Disease, and Cardiac Arrhythmias. Written material provided at class time. Flowsheet Row Cardiac Rehab from 05/22/2024 in [facility] Cardiac and Pulmonary Rehab  Date 04/03/24  Educator [facility]  Instruction Review Code 1- Verbalizes Understanding    Medication Safety: - Group verbal and visual instruction to review commonly prescribed medications for heart and lung disease. Reviews the medication, class of the drug, and side effects. Includes the steps to properly store meds and maintain the prescription regimen. Written material provided at class time. Flowsheet Row Cardiac Rehab from 05/22/2024 in [facility] Cardiac and Pulmonary Rehab  Date 04/10/24  Educator sb  Instruction Review Code 1- Verbalizes Understanding    Intimacy: - Group verbal instruction through game format to discuss how heart and lung disease can affect sexual intimacy. Written material provided at class time. Flowsheet  Row Cardiac Rehab from 05/22/2024 in [facility] Cardiac and Pulmonary Rehab  Date 05/22/24  Educator nt  Instruction Review Code 1- TEFL teacher Understanding    Know Your Numbers and Heart Failure: - Group verbal and visual instruction to discuss disease risk factors for cardiac and pulmonary disease and treatment options.  Reviews associated critical values for Overweight/Obesity, Hypertension, Cholesterol, and Diabetes.  Discusses basics of heart failure: signs/symptoms and treatments.  Introduces Heart Failure Zone chart for action plan for heart failure. Written material provided at class time. Flowsheet Row Cardiac Rehab from 05/22/2024 in [facility] Cardiac and Pulmonary Rehab  Date 04/17/24  Educator sb  Instruction Review Code 1- Verbalizes Understanding    Infection Prevention: - Provides verbal and written material to  individual with discussion of infection control including proper hand washing and proper equipment cleaning during exercise session. Flowsheet Row Cardiac Rehab from 05/22/2024 in [facility] Cardiac and Pulmonary Rehab  Date 03/20/24  Educator MB  Instruction Review Code 1- Verbalizes Understanding    Falls Prevention: - Provides verbal and written material to individual with discussion of falls prevention and safety. Flowsheet Row Cardiac Rehab from 05/22/2024 in [facility] Cardiac and Pulmonary Rehab  Date 03/20/24  Educator MB  Instruction Review Code 1- Verbalizes Understanding    Other: -Provides group and verbal instruction on various topics (see comments)   Knowledge Questionnaire Score:  Knowledge Questionnaire Score - 05/22/24 1122       Knowledge Questionnaire Score   Pre Score 25/26    Post Score 25/26          Core Components/Risk Factors/Patient Goals at Admission:  Personal Goals and Risk Factors at Admission - 03/20/24 1047       Core Components/Risk Factors/Patient Goals on Admission    Weight Management Yes;Weight Loss    Intervention Weight Management: Develop a combined nutrition and exercise program designed to reach desired caloric intake, while maintaining appropriate intake of nutrient and fiber, sodium and fats, and appropriate energy expenditure required for the weight goal.;Weight Management: Provide education and appropriate resources to help participant work on and attain dietary goals.;Weight Management/Obesity: Establish reasonable short term and long term weight goals.    Admit Weight 194 lb 9.6 oz (88.3 kg)    Goal Weight: Short Term 189 lb (85.7 kg)    Goal Weight: Long Term 160 lb (72.6 kg)    Expected Outcomes Short Term: Continue to assess and modify interventions until short term weight is achieved;Long Term: Adherence to nutrition and physical activity/exercise program aimed toward attainment of established weight goal;Weight Loss: Understanding of  general recommendations for a balanced deficit meal plan, which promotes 1-2 lb weight loss per week and includes a negative energy balance of [phone] kcal/d;Understanding recommendations for meals to include 15-35% energy as protein, 25-35% energy from fat, 35-60% energy from carbohydrates, less than 200mg  of dietary cholesterol, 20-35 gm of total fiber daily;Understanding of distribution of calorie intake throughout the day with the consumption of 4-5 meals/snacks    Hypertension Yes    Intervention Provide education on lifestyle modifcations including regular physical activity/exercise, weight management, moderate sodium restriction and increased consumption of fresh fruit, vegetables, and low fat dairy, alcohol moderation, and smoking cessation.;Monitor prescription use compliance.    Expected Outcomes Short Term: Continued assessment and intervention until BP is < 140/47mm HG in hypertensive participants. < 130/10mm HG in hypertensive participants with diabetes, heart failure or chronic kidney disease.;Long Term: Maintenance of blood pressure at goal levels.    Lipids Yes  Intervention Provide education and support for participant on nutrition & aerobic/resistive exercise along with prescribed medications to achieve LDL 70mg , HDL >40mg .    Expected Outcomes Short Term: Participant states understanding of desired cholesterol values and is compliant with medications prescribed. Participant is following exercise prescription and nutrition guidelines.;Long Term: Cholesterol controlled with medications as prescribed, with individualized exercise RX and with personalized nutrition plan. Value goals: LDL < 70mg , HDL > 40 mg.          Education:Diabetes - Individual verbal and written instruction to review signs/symptoms of diabetes, desired ranges of glucose level fasting, after meals and with exercise. Acknowledge that pre and post exercise glucose checks will be done for 3 sessions at entry of  program.   Core Components/Risk Factors/Patient Goals Review:   Goals and Risk Factor Review     Row Name 04/05/24 1120 04/29/24 1136           Core Components/Risk Factors/Patient Goals Review   Personal Goals Review Weight Management/Obesity Hypertension      Review Jermarion wants to try to lose weight and has lose 2 pounds since starting the program. Informed him to try to lose around 5 pounds in the next few weeks.  He is meeting with the RD on June 30th about his nutrition. Encouraged Tom to check his BP at home and make sure it remains stable and matches closely with readings taken here at rehab      Expected Outcomes Short: lose 5 pounds in the next few weeks. Long: reach weight goal. STG: Check BP at home. LTG: Manage risk factors independently         Core Components/Risk Factors/Patient Goals at Discharge (Final Review):   Goals and Risk Factor Review - 04/29/24 1136       Core Components/Risk Factors/Patient Goals Review   Personal Goals Review Hypertension    Review Encouraged Tom to check his BP at home and make sure it remains stable and matches closely with readings taken here at rehab    Expected Outcomes STG: Check BP at home. LTG: Manage risk factors independently          ITP Comments:  ITP Comments     Row Name 03/19/24 1350 03/20/24 0955 03/20/24 1031 03/25/24 1116 04/17/24 0957   ITP Comments Virtual orientation call completed today. he has an appointment on Date: 03/20/2024  for EP eval and gym Orientation.  Documentation of diagnosis can be found in [facility] 01/04/2024 . 30 Day review completed. Medical Director ITP review done, changes made as directed, and signed approval by Medical Director.    new to program Completed and gym orientation for cardiac rehab. Initial ITP created and sent for review to Dr. Oneil Pinal, Medical Director. First full day of exercise!  Patient was oriented to gym and equipment including functions, settings, policies, and  procedures.  Patient's individual exercise prescription and treatment plan were reviewed.  All starting workloads were established based on the results of the 6 minute walk test done at initial orientation visit.  The plan for exercise progression was also introduced and progression will be customized based on patient's performance and goals. 30 Day review completed. Medical Director ITP review done, changes made as directed, and signed approval by Medical Director. New to program    Row Name 05/15/24 0836 05/27/24 1113         ITP Comments 30 Day review completed. Medical Director ITP review done, changes made as directed, and signed approval by Medical  Director. Khamron graduated today from  rehab with 36 sessions completed.  Details of the patient's exercise prescription and what He needs to do in order to continue the prescription and progress were discussed with patient.  Patient was given a copy of prescription and goals.  Patient verbalized understanding. Leary plans to continue to exercise by walking at home.         Comments: discharge ITP

## 2024-05-27 NOTE — Progress Notes (Signed)
 Daily Session Note  Patient Details  Name: Tyler David MRN: [id number] Date of Birth: [birth year] Referring Provider:   Flowsheet Row Cardiac Rehab from 03/20/2024 in [facility] Cardiac and Pulmonary Rehab  Referring Provider End, Lonni, MD    Encounter Date: 05/27/2024  Check In:  Session Check In - 05/27/24 1056       Check-In   Supervising physician immediately available to respond to emergencies See telemetry face sheet for immediately available ER MD    Location [facility]-Cardiac & Pulmonary Rehab    Staff Present Burnard Davenport RN,BSN,MPA;Maxon Burnell BS, Exercise Physiologist;Joseph [facility] BS, ACSM CEP, Exercise Physiologist    Virtual Visit No    Medication changes reported     No    Fall or balance concerns reported    No    Warm-up and Cool-down Performed on first and last piece of equipment    Resistance Training Performed Yes    VAD Patient? No    PAD/SET Patient? No      Pain Assessment   Currently in Pain? No/denies             Social History   Tobacco Use  Smoking Status Never  Smokeless Tobacco Never    Goals Met:  Independence with exercise equipment Exercise tolerated well No report of concerns or symptoms today Strength training completed today  Goals Unmet:  Not Applicable  Comments:  Dewitte graduated today from  rehab with 36 sessions completed.  Details of the patient's exercise prescription and what He needs to do in order to continue the prescription and progress were discussed with patient.  Patient was given a copy of prescription and goals.  Patient verbalized understanding. Tyler David plans to continue to exercise by walking at home.    Dr. Oneil Pinal is Medical Director for [facility] Cardiac Rehabilitation.  Dr. Fuad Aleskerov is Medical Director for [facility] Pulmonary Rehabilitation.

## 2024-05-27 NOTE — Progress Notes (Signed)
 Discharge Summary Tyler David, Tyler David DOB: Feb 21, 1946  Ned graduated today from  rehab with 36 sessions completed.  Details of the patient's exercise prescription and what He needs to do in order to continue the prescription and progress were discussed with patient.  Patient was given a copy of prescription and goals.  Patient verbalized understanding. Linell plans to continue to exercise by walking at home.   6 Minute Walk     Row Name 03/20/24 1032 05/20/24 1119       6 Minute Walk   Phase Initial Discharge    Distance 1035 feet 1140 feet    Distance % Change -- 9 %    Distance Feet Change -- 105 ft    Walk Time 6 minutes 6 minutes    # of Rest Breaks 0 0    MPH 1.96 2.16    METS 2.32 2.33    RPE 13 15    Perceived Dyspnea  2 3    VO2 Peak 8.11 8.14    Symptoms Yes (comment) Yes (comment)    Comments SOB SOB    Resting HR 64 bpm 73 bpm    Resting BP 150/70 136/72    Resting Oxygen Saturation  100 % --    Exercise Oxygen Saturation  during 6 min walk 99 % --    Max Ex. HR 104 bpm 118 bpm    Max Ex. BP 184/88 144/80    2 Minute Post BP 178/80 124/80

## 2024-05-29 ENCOUNTER — Encounter

## 2024-05-31 ENCOUNTER — Encounter

## 2024-06-03 ENCOUNTER — Encounter

## 2024-06-05 ENCOUNTER — Encounter

## 2024-06-07 ENCOUNTER — Encounter

## 2024-06-10 ENCOUNTER — Encounter

## 2024-06-12 ENCOUNTER — Encounter

## 2024-06-12 ENCOUNTER — Encounter (HOSPITAL_COMMUNITY): Payer: Self-pay

## 2024-06-13 ENCOUNTER — Ambulatory Visit
Admission: RE | Admit: 2024-06-13 | Discharge: 2024-06-13 | Disposition: A | Discharge status: Home / Self Care | Source: Ambulatory Visit | Attending: Physician Assistant | Admitting: Physician Assistant

## 2024-06-13 ENCOUNTER — Ambulatory Visit: Payer: Self-pay | Admitting: Physician Assistant

## 2024-06-13 DIAGNOSIS — Z951 Presence of aortocoronary bypass graft: Secondary | ICD-10-CM | POA: Diagnosis not present

## 2024-06-13 DIAGNOSIS — R931 Abnormal findings on diagnostic imaging of heart and coronary circulation: Secondary | ICD-10-CM | POA: Insufficient documentation

## 2024-06-13 DIAGNOSIS — I249 Acute ischemic heart disease, unspecified: Secondary | ICD-10-CM

## 2024-06-13 DIAGNOSIS — I998 Other disorder of circulatory system: Secondary | ICD-10-CM | POA: Diagnosis not present

## 2024-06-13 DIAGNOSIS — I700 Atherosclerosis of aorta: Secondary | ICD-10-CM | POA: Diagnosis not present

## 2024-06-13 DIAGNOSIS — I289 Disease of pulmonary vessels, unspecified: Secondary | ICD-10-CM | POA: Diagnosis not present

## 2024-06-13 LAB — NM PET CT CARDIAC PERFUSION MULTI W/ABSOLUTE BLOODFLOW
LV dias vol: 198 mL (ref 62–150)
LV sys vol: 133 mL (ref 4.2–5.8)
MBFR: 1.21
Nuc Rest EF: 31 %
Nuc Stress EF: 43 %
Peak HR: 68 {beats}/min
Rest HR: 62 {beats}/min
Rest MBF: 0.58 ml/g/min
Rest Nuclear Isotope Dose: 23 mCi
SRS: 0
SSS: 17
ST Depression (mm): 0 mm
Stress MBF: 0.7 ml/g/min
Stress Nuclear Isotope Dose: 23.4 mCi
TID: 1.01

## 2024-06-13 MED ORDER — REGADENOSON 0.4 MG/5ML IV SOLN
0.4000 mg | Freq: Once | INTRAVENOUS | Status: AC
  Administered 2024-06-13: 0.4 mg via INTRAVENOUS
  Filled 2024-06-13: qty 5

## 2024-06-13 MED ORDER — RUBIDIUM RB82 GENERATOR (RUBYFILL)
25.0000 | PACK | Freq: Once | INTRAVENOUS | Status: AC
Start: 2024-06-13 — End: 2024-06-13
  Administered 2024-06-13: 23.39 via INTRAVENOUS

## 2024-06-13 MED ORDER — RUBIDIUM RB82 GENERATOR (RUBYFILL)
25.0000 | PACK | Freq: Once | INTRAVENOUS | Status: AC
  Administered 2024-06-13: 23.02 via INTRAVENOUS

## 2024-06-13 MED ORDER — REGADENOSON 0.4 MG/5ML IV SOLN
INTRAVENOUS | Status: AC
  Filled 2024-06-13: qty 5

## 2024-06-14 ENCOUNTER — Ambulatory Visit

## 2024-06-14 ENCOUNTER — Encounter

## 2024-06-14 VITALS — BP 128/64 | HR 56 | Ht 67.5 in | Wt 193.2 lb

## 2024-06-14 DIAGNOSIS — R9439 Abnormal result of other cardiovascular function study: Secondary | ICD-10-CM

## 2024-06-14 DIAGNOSIS — I480 Paroxysmal atrial fibrillation: Secondary | ICD-10-CM

## 2024-06-14 DIAGNOSIS — Z79899 Other long term (current) drug therapy: Secondary | ICD-10-CM | POA: Diagnosis not present

## 2024-06-14 DIAGNOSIS — I420 Dilated cardiomyopathy: Secondary | ICD-10-CM | POA: Diagnosis not present

## 2024-06-14 DIAGNOSIS — I2510 Atherosclerotic heart disease of native coronary artery without angina pectoris: Secondary | ICD-10-CM | POA: Diagnosis not present

## 2024-06-14 DIAGNOSIS — I255 Ischemic cardiomyopathy: Secondary | ICD-10-CM

## 2024-06-14 DIAGNOSIS — I422 Other hypertrophic cardiomyopathy: Secondary | ICD-10-CM | POA: Diagnosis not present

## 2024-06-14 DIAGNOSIS — Z951 Presence of aortocoronary bypass graft: Secondary | ICD-10-CM | POA: Diagnosis not present

## 2024-06-14 DIAGNOSIS — I200 Unstable angina: Secondary | ICD-10-CM | POA: Insufficient documentation

## 2024-06-14 NOTE — Progress Notes (Signed)
 Cardiology Office Note   Date:  06/14/2024  ID:  Tyler David, DOB May 01, 1946, MRN [id number] PCP: Arloa Elsie SAUNDERS, MD  [facility] Providers Cardiologist:  Lonni Hanson, MD   History of Present Illness Tyler David is a 78 y.o. 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  Patient reports that he saw the report come through MyChart about his abnormal stress exam last night and was understandably very concerned.  He denies any chest discomfort, though he says that he has never really had any chest discomfort throughout all of this.  His symptoms that led up to bypass surgery were generally dyspnea on exertion and then a funny sensation in his throat.  He says that he is still having dyspnea on exertion but no throat sensation.  He says that he has some very minimal lower extremity edema that typically goes away at night.  He has no orthopnea.  In general, the dyspnea on exertion that he is having is unchanged over the last several months.  Relevant CVD History - Stress PET 06/13/2024 large, severe apical to basal inferior reversible defect without a resting perfusion defect.  Abnormal MBFR, though has CABG - TTE 05/15/2024 LVEF 25 to 30%, grade 2 diastolic dysfunction, mild MR, mild to moderate TR. LVEF 35 to 40% pre-bypass - Zio monitor 03/04/2024 with rare ectopy and no A-fib or other sustained arrhythmia   ROS: Pt denies any chest discomfort, jaw pain, arm pain, palpitations, syncope, presyncope, orthopnea, PND, or LE edema.  Studies Reviewed I have independently reviewed the patient's ECG, PET study, and prior echocardiograms.  Physical Exam VS:  BP 128/64 (BP Location: Left Arm, Patient Position: Sitting, Cuff Size: Normal)   Pulse (!) 56   Ht 5' 7.5 (1.715 m)   Wt 193  lb 3.2 oz (87.6 kg)   SpO2 97%   BMI 29.81 kg/m        Wt Readings from Last 3 Encounters:  06/14/24 193 lb 3.2 oz (87.6 kg)  05/20/24 196 lb (88.9 kg)  03/20/24 194 lb 9.6 oz (88.3 kg)    GEN: No acute distress. NECK: No JVD; No carotid bruits. CARDIAC: RRR, no murmurs, rubs, gallops. RESPIRATORY:  Clear to auscultation. EXTREMITIES:  Warm and well-perfused. No edema.  ASSESSMENT AND PLAN CAD status post CABG Ischemic cardiomyopathy Abnormal stress PET exam CKD 3B Given relatively recent CABG, persistently reduced LVEF despite revascularization, and large reversible perfusion defect on PET, cardiac catheterization is indicated.  Will plan to send for RHC and coronary angiography with graft angiography.  He is currently without obvious angina or heart failure symptoms.  His renal dysfunction does limit HF GDMT somewhat.  Plan: - RHC, coronary angiography, and graft angiography with prehydration given significant renal dysfunction - Will need to hold Eliquis  48 hours before angiography; continue ASA 81 mg daily while we are holding Eliquis  - Continue metoprolol  succinate 25 mg daily given bradycardia; we probably cannot increase this - Renal dysfunction is borderline for SGLT2 inhibitor, MRA, or ARB/ARNI.  Can readdress this after cath to see how his renal function holds up following contrast administration - He is not in overt heart failure currently and does not have any orthopnea, so we will hold off on further adjustments to his diuretic regimen until RHC/LHC have been performed - CBC, BMP  Postop A-fib with elevated CHA2DS2-VASc score CHA2DS2-VASc greater than 5.  Currently in sinus rhythm and last monitor without any A-fib.  As above, hold Eliquis  in the periprocedural period per protocol.  Going forward, his AC/antiplatelet regimen will depend on whether or not any interventions are performed  6.   LBBB Chronic LBBB.  Consider CRT-D once all revascularization and medical  therapy options have been maximized.  7.   HLD 8.   Statin intolerance On Zetia  given known statin intolerance.  He reports insurance did not pay for the bempedoic acid  last LDL 112 12/2023.  Goal LDL less than 55, consider adding Repatha to his regimen at next appointment.  Will defer this to his regular providers.    Informed Consent   The risks [stroke (1 in 1000), death (1 in 1000), kidney failure [usually temporary] (1 in 500), bleeding (1 in 200), allergic reaction [possibly serious] (1 in 200)], benefits (diagnostic support and management of coronary artery disease) and alternatives of a cardiac catheterization were discussed in detail with [name] and he is willing to proceed.     Dispo: RTC with regular providers after coronary angiography to optimize medical therapy and review CRT-D options  Signed, Caron Poser, MD

## 2024-06-14 NOTE — Patient Instructions (Addendum)
 Medication Instructions:   Your physician recommends that you continue on your current medications as directed. Please refer to the Current Medication list given to you today.   *If you need a refill on your cardiac medications before your next appointment, please call your pharmacy*  Lab Work: Your provider would like for you to have following labs drawn today CBC, BMP.   If you have labs (blood work) drawn today and your tests are completely normal, you will receive your results only by: MyChart Message (if you have MyChart) OR A paper copy in the mail If you have any lab test that is abnormal or we need to change your treatment, we will call you to review the results.  Testing/Procedures:  [place] [facility] DE[facility] OF [facility] [place] [address] [place] [address] Dept: [phone] Loc: [phone]  CRIMSON BEER  06/14/2024  You are scheduled for a Cardiac Catheterization on Friday, September 5 with Dr. Lonni End.  1. Please arrive at the [facility] Entrance of [facility], [address], [place] [address] at 7:30 AM (This is 5 hour(s) prior to your procedure time).  Proceed to the Check-In Desk directly inside the entrance.  Procedure Parking: Use the entrance off of the [address] side of the hospital. Turn right upon entering and follow the driveway to parking that is directly in front of the [facility]ascular Center. There is no valet parking available at this entrance, however there is an awning directly in front of the Heart & Vascular Center for drop off/ pick up for patients.  Special note: Every effort is made to have your procedure done on time. Please understand that emergencies sometimes delay scheduled procedures.  2. Diet: Do not eat solid foods after midnight.  The patient may have clear liquids until 5am upon the day of the procedure.  3. Labs: CBC and  BMP collected on 06/14/2024  4. Medication instructions in preparation for your procedure:   Contrast Allergy: No    Stop taking Eliquis  on Wednesday, June 19, 2024  Stop taking Lasix  on Thursday, June 20, 2024   On the morning of your procedure, take your Aspirin  81 mg and any morning medicines NOT listed above.  You may use sips of water .  5. Plan to go home the same day, you will only stay overnight if medically necessary. 6. Bring a current list of your medications and current insurance cards. 7. You MUST have a responsible person to drive you home. 8. Someone MUST be with you the first 24 hours after you arrive home or your discharge will be delayed. 9. Please wear clothes that are easy to get on and off and wear slip-on shoes.  Thank you for allowing us  to care for you!   -- [facility]   Follow-Up: At [facility], you and your health needs are our priority.  As part of our continuing mission to provide you with exceptional heart care, our providers are all part of one team.  This team includes your primary Cardiologist (physician) and Advanced Practice Providers or APPs (Physician Assistants and Nurse Practitioners) who all work together to provide you with the care you need, when you need it.  Your next appointment:   Keep current appointment with Bernardino Bring, PA-C on 06/25/2024 @1030     We recommend signing up for the patient portal called MyChart.  Sign  up information is provided on this After Visit Summary.  MyChart is used to connect with patients for Virtual Visits (Telemedicine).  Patients are able to view lab/test results, encounter notes, upcoming appointments, etc.  Non-urgent messages can be sent to your provider as well.   To learn more about what you can do with MyChart, go to [email or web address].

## 2024-06-14 NOTE — H&P (View-Only) (Signed)
 Cardiology Office Note   Date:  06/14/2024  ID:  Tyler David, DOB May 01, 1946, MRN [id number] PCP: Tyler Elsie SAUNDERS, MD  [facility] Providers Cardiologist:  Tyler Hanson, MD   History of Present Illness Tyler David is a 78 y.o. 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  Patient reports that he saw the report come through MyChart about his abnormal stress exam last night and was understandably very concerned.  He denies any chest discomfort, though he says that he has never really had any chest discomfort throughout all of this.  His symptoms that led up to bypass surgery were generally dyspnea on exertion and then a funny sensation in his throat.  He says that he is still having dyspnea on exertion but no throat sensation.  He says that he has some very minimal lower extremity edema that typically goes away at night.  He has no orthopnea.  In general, the dyspnea on exertion that he is having is unchanged over the last several months.  Relevant CVD History - Stress PET 06/13/2024 large, severe apical to basal inferior reversible defect without a resting perfusion defect.  Abnormal MBFR, though has CABG - TTE 05/15/2024 LVEF 25 to 30%, grade 2 diastolic dysfunction, mild MR, mild to moderate TR. LVEF 35 to 40% pre-bypass - Zio monitor 03/04/2024 with rare ectopy and no A-fib or other sustained arrhythmia   ROS: Pt denies any chest discomfort, jaw pain, arm pain, palpitations, syncope, presyncope, orthopnea, PND, or LE edema.  Studies Reviewed I have independently reviewed the patient's ECG, PET study, and prior echocardiograms.  Physical Exam VS:  BP 128/64 (BP Location: Left Arm, Patient Position: Sitting, Cuff Size: Normal)   Pulse (!) 56   Ht 5' 7.5 (1.715 m)   Wt 193  lb 3.2 oz (87.6 kg)   SpO2 97%   BMI 29.81 kg/m        Wt Readings from Last 3 Encounters:  06/14/24 193 lb 3.2 oz (87.6 kg)  05/20/24 196 lb (88.9 kg)  03/20/24 194 lb 9.6 oz (88.3 kg)    GEN: No acute distress. NECK: No JVD; No carotid bruits. CARDIAC: RRR, no murmurs, rubs, gallops. RESPIRATORY:  Clear to auscultation. EXTREMITIES:  Warm and well-perfused. No edema.  ASSESSMENT AND PLAN CAD status post CABG Ischemic cardiomyopathy Abnormal stress PET exam CKD 3B Given relatively recent CABG, persistently reduced LVEF despite revascularization, and large reversible perfusion defect on PET, cardiac catheterization is indicated.  Will plan to send for RHC and coronary angiography with graft angiography.  He is currently without obvious angina or heart failure symptoms.  His renal dysfunction does limit HF GDMT somewhat.  Plan: - RHC, coronary angiography, and graft angiography with prehydration given significant renal dysfunction - Will need to hold Eliquis  48 hours before angiography; continue ASA 81 mg daily while we are holding Eliquis  - Continue metoprolol  succinate 25 mg daily given bradycardia; we probably cannot increase this - Renal dysfunction is borderline for SGLT2 inhibitor, MRA, or ARB/ARNI.  Can readdress this after cath to see how his renal function holds up following contrast administration - He is not in overt heart failure currently and does not have any orthopnea, so we will hold off on further adjustments to his diuretic regimen until RHC/LHC have been performed - CBC, BMP  Postop A-fib with elevated CHA2DS2-VASc score CHA2DS2-VASc greater than 5.  Currently in sinus rhythm and last monitor without any A-fib.  As above, hold Eliquis  in the periprocedural period per protocol.  Going forward, his AC/antiplatelet regimen will depend on whether or not any interventions are performed  6.   LBBB Chronic LBBB.  Consider CRT-D once all revascularization and medical  therapy options have been maximized.  7.   HLD 8.   Statin intolerance On Zetia  given known statin intolerance.  He reports insurance did not pay for the bempedoic acid  last LDL 112 12/2023.  Goal LDL less than 55, consider adding Repatha to his regimen at next appointment.  Will defer this to his regular providers.    Informed Consent   The risks [stroke (1 in 1000), death (1 in 1000), kidney failure [usually temporary] (1 in 500), bleeding (1 in 200), allergic reaction [possibly serious] (1 in 200)], benefits (diagnostic support and management of coronary artery disease) and alternatives of a cardiac catheterization were discussed in detail with Tyler David and he is willing to proceed.     Dispo: RTC with regular providers after coronary angiography to optimize medical therapy and review CRT-D options  Signed, Tyler Poser, MD

## 2024-06-15 ENCOUNTER — Ambulatory Visit: Payer: Self-pay

## 2024-06-15 LAB — CBC
Hematocrit: 41.4 % (ref 37.5–51.0)
Hemoglobin: 12.8 g/dL — ABNORMAL LOW (ref 13.0–17.7)
MCH: 27.7 pg (ref 26.6–33.0)
MCHC: 30.9 g/dL — ABNORMAL LOW (ref 31.5–35.7)
MCV: 90 fL (ref 79–97)
Platelets: 228 x10E3/uL (ref 150–450)
RBC: 4.62 x10E6/uL (ref 4.14–5.80)
RDW: 13.2 % (ref 11.6–15.4)
WBC: 8.9 x10E3/uL (ref 3.4–10.8)

## 2024-06-15 LAB — BASIC METABOLIC PANEL WITH GFR
BUN/Creatinine Ratio: 15 (ref 10–24)
BUN: 29 mg/dL — ABNORMAL HIGH (ref 8–27)
CO2: 22 mmol/L (ref 20–29)
Calcium: 8.9 mg/dL (ref 8.6–10.2)
Chloride: 105 mmol/L (ref 96–106)
Creatinine, Ser: 1.92 mg/dL — ABNORMAL HIGH (ref 0.76–1.27)
Glucose: 94 mg/dL (ref 70–99)
Potassium: 4.5 mmol/L (ref 3.5–5.2)
Sodium: 144 mmol/L (ref 134–144)
eGFR: 35 mL/min/1.73 — ABNORMAL LOW (ref >59)

## 2024-06-18 DIAGNOSIS — E039 Hypothyroidism, unspecified: Secondary | ICD-10-CM | POA: Diagnosis not present

## 2024-06-19 ENCOUNTER — Encounter

## 2024-06-20 ENCOUNTER — Telehealth: Payer: Self-pay | Admitting: *Deleted

## 2024-06-20 NOTE — Telephone Encounter (Signed)
 Called to confirm/remind patient of their procedure.   Scheduled for: R/L heart cath  [x]  Date 06/21/24  [x]  Time 12:30 pm [x]  Arrival time 07:30 pm [x]  Location [facility]   [x]  Designated Driver  [x]  Instructions, time, and location reviewed with patient    [x]  H&P within 30 days  [x]  EKG within 30 days  [x]  Orders  [x]  Labs  [x]  GFR 35  [x]  Diet  [x]  Medication instructions reviewed  [x]  Hydration instructions reviewed.   [x]  Spoke with patient and reviewed all information. He had no further questions at this time.

## 2024-06-21 ENCOUNTER — Inpatient Hospital Stay
Admission: RE | Admit: 2024-06-21 | Discharge: 2024-06-25 | DRG: 321 | Disposition: A | Discharge status: Home / Self Care | Attending: Internal Medicine | Admitting: Internal Medicine

## 2024-06-21 ENCOUNTER — Other Ambulatory Visit: Payer: Self-pay

## 2024-06-21 ENCOUNTER — Encounter: Payer: Self-pay | Admitting: Internal Medicine

## 2024-06-21 ENCOUNTER — Inpatient Hospital Stay

## 2024-06-21 ENCOUNTER — Encounter
Admission: RE | Disposition: A | Discharge status: Home / Self Care | Payer: Self-pay | Source: Home / Self Care | Attending: Internal Medicine

## 2024-06-21 DIAGNOSIS — Z6829 Body mass index (BMI) 29.0-29.9, adult: Secondary | ICD-10-CM

## 2024-06-21 DIAGNOSIS — I5023 Acute on chronic systolic (congestive) heart failure: Secondary | ICD-10-CM | POA: Diagnosis not present

## 2024-06-21 DIAGNOSIS — Z951 Presence of aortocoronary bypass graft: Secondary | ICD-10-CM | POA: Diagnosis not present

## 2024-06-21 DIAGNOSIS — R9439 Abnormal result of other cardiovascular function study: Secondary | ICD-10-CM | POA: Diagnosis present

## 2024-06-21 DIAGNOSIS — I420 Dilated cardiomyopathy: Secondary | ICD-10-CM | POA: Diagnosis present

## 2024-06-21 DIAGNOSIS — I509 Heart failure, unspecified: Secondary | ICD-10-CM

## 2024-06-21 DIAGNOSIS — I25110 Atherosclerotic heart disease of native coronary artery with unstable angina pectoris: Secondary | ICD-10-CM | POA: Diagnosis not present

## 2024-06-21 DIAGNOSIS — I200 Unstable angina: Secondary | ICD-10-CM | POA: Insufficient documentation

## 2024-06-21 DIAGNOSIS — E785 Hyperlipidemia, unspecified: Secondary | ICD-10-CM | POA: Diagnosis not present

## 2024-06-21 DIAGNOSIS — I447 Left bundle-branch block, unspecified: Secondary | ICD-10-CM | POA: Diagnosis present

## 2024-06-21 DIAGNOSIS — I5022 Chronic systolic (congestive) heart failure: Secondary | ICD-10-CM | POA: Diagnosis not present

## 2024-06-21 DIAGNOSIS — J90 Pleural effusion, not elsewhere classified: Secondary | ICD-10-CM | POA: Diagnosis not present

## 2024-06-21 DIAGNOSIS — Z8673 Personal history of transient ischemic attack (TIA), and cerebral infarction without residual deficits: Secondary | ICD-10-CM

## 2024-06-21 DIAGNOSIS — Z8249 Family history of ischemic heart disease and other diseases of the circulatory system: Secondary | ICD-10-CM | POA: Diagnosis not present

## 2024-06-21 DIAGNOSIS — I255 Ischemic cardiomyopathy: Secondary | ICD-10-CM | POA: Diagnosis present

## 2024-06-21 DIAGNOSIS — I2510 Atherosclerotic heart disease of native coronary artery without angina pectoris: Secondary | ICD-10-CM | POA: Diagnosis present

## 2024-06-21 DIAGNOSIS — Z7902 Long term (current) use of antithrombotics/antiplatelets: Secondary | ICD-10-CM

## 2024-06-21 DIAGNOSIS — I97190 Other postprocedural cardiac functional disturbances following cardiac surgery: Secondary | ICD-10-CM | POA: Diagnosis not present

## 2024-06-21 DIAGNOSIS — I25718 Atherosclerosis of autologous vein coronary artery bypass graft(s) with other forms of angina pectoris: Secondary | ICD-10-CM | POA: Diagnosis not present

## 2024-06-21 DIAGNOSIS — I2582 Chronic total occlusion of coronary artery: Secondary | ICD-10-CM | POA: Diagnosis not present

## 2024-06-21 DIAGNOSIS — Y838 Other surgical procedures as the cause of abnormal reaction of the patient, or of later complication, without mention of misadventure at the time of the procedure: Secondary | ICD-10-CM | POA: Diagnosis not present

## 2024-06-21 DIAGNOSIS — N1832 Chronic kidney disease, stage 3b: Secondary | ICD-10-CM | POA: Diagnosis not present

## 2024-06-21 DIAGNOSIS — J811 Chronic pulmonary edema: Secondary | ICD-10-CM | POA: Diagnosis not present

## 2024-06-21 DIAGNOSIS — I130 Hypertensive heart and chronic kidney disease with heart failure and stage 1 through stage 4 chronic kidney disease, or unspecified chronic kidney disease: Secondary | ICD-10-CM | POA: Diagnosis present

## 2024-06-21 DIAGNOSIS — Z79890 Hormone replacement therapy (postmenopausal): Secondary | ICD-10-CM | POA: Diagnosis not present

## 2024-06-21 DIAGNOSIS — Z96642 Presence of left artificial hip joint: Secondary | ICD-10-CM | POA: Diagnosis present

## 2024-06-21 DIAGNOSIS — I4891 Unspecified atrial fibrillation: Secondary | ICD-10-CM | POA: Diagnosis not present

## 2024-06-21 DIAGNOSIS — I5020 Unspecified systolic (congestive) heart failure: Secondary | ICD-10-CM | POA: Diagnosis not present

## 2024-06-21 DIAGNOSIS — Z79899 Other long term (current) drug therapy: Secondary | ICD-10-CM

## 2024-06-21 DIAGNOSIS — I9789 Other postprocedural complications and disorders of the circulatory system, not elsewhere classified: Secondary | ICD-10-CM | POA: Diagnosis not present

## 2024-06-21 DIAGNOSIS — Z7901 Long term (current) use of anticoagulants: Secondary | ICD-10-CM

## 2024-06-21 DIAGNOSIS — E871 Hypo-osmolality and hyponatremia: Secondary | ICD-10-CM | POA: Diagnosis present

## 2024-06-21 DIAGNOSIS — I10 Essential (primary) hypertension: Secondary | ICD-10-CM | POA: Diagnosis not present

## 2024-06-21 DIAGNOSIS — E663 Overweight: Secondary | ICD-10-CM | POA: Diagnosis present

## 2024-06-21 DIAGNOSIS — N183 Chronic kidney disease, stage 3 unspecified: Secondary | ICD-10-CM | POA: Diagnosis not present

## 2024-06-21 DIAGNOSIS — I517 Cardiomegaly: Secondary | ICD-10-CM | POA: Diagnosis not present

## 2024-06-21 HISTORY — PX: CORONARY STENT INTERVENTION: CATH118234

## 2024-06-21 HISTORY — PX: RIGHT/LEFT HEART CATH AND CORONARY ANGIOGRAPHY: CATH118266

## 2024-06-21 LAB — BASIC METABOLIC PANEL WITH GFR
Anion gap: 8 (ref 5–15)
BUN: 34 mg/dL — ABNORMAL HIGH (ref 8–23)
CO2: 22 mmol/L (ref 22–32)
Calcium: 8.4 mg/dL — ABNORMAL LOW (ref 8.9–10.3)
Chloride: 107 mmol/L (ref 98–111)
Creatinine, Ser: 1.85 mg/dL — ABNORMAL HIGH (ref 0.61–1.24)
GFR, Estimated: 37 mL/min — ABNORMAL LOW (ref >60)
Glucose, Bld: 93 mg/dL (ref 70–99)
Potassium: 3.9 mmol/L (ref 3.5–5.1)
Sodium: 137 mmol/L (ref 135–145)

## 2024-06-21 LAB — POCT I-STAT EG7
Acid-base deficit: 2 mmol/L (ref 0.0–2.0)
Bicarbonate: 24.1 mmol/L (ref 20.0–28.0)
Calcium, Ion: 1.19 mmol/L (ref 1.15–1.40)
HCT: 35 % — ABNORMAL LOW (ref 39.0–52.0)
Hemoglobin: 11.9 g/dL — ABNORMAL LOW (ref 13.0–17.0)
O2 Saturation: 59 %
Potassium: 4 mmol/L (ref 3.5–5.1)
Sodium: 145 mmol/L (ref 135–145)
TCO2: 25 mmol/L (ref 22–32)
pCO2, Ven: 45.1 mmHg (ref 44–60)
pH, Ven: 7.335 (ref 7.25–7.43)
pO2, Ven: 33 mmHg (ref 32–45)

## 2024-06-21 LAB — POCT I-STAT 7, (LYTES, BLD GAS, ICA,H+H)
Acid-base deficit: 3 mmol/L — ABNORMAL HIGH (ref 0.0–2.0)
Bicarbonate: 22 mmol/L (ref 20.0–28.0)
Calcium, Ion: 1.2 mmol/L (ref 1.15–1.40)
HCT: 35 % — ABNORMAL LOW (ref 39.0–52.0)
Hemoglobin: 11.9 g/dL — ABNORMAL LOW (ref 13.0–17.0)
O2 Saturation: 94 %
Potassium: 4 mmol/L (ref 3.5–5.1)
Sodium: 144 mmol/L (ref 135–145)
TCO2: 23 mmol/L (ref 22–32)
pCO2 arterial: 37.6 mmHg (ref 32–48)
pH, Arterial: 7.375 (ref 7.35–7.45)
pO2, Arterial: 74 mmHg — ABNORMAL LOW (ref 83–108)

## 2024-06-21 LAB — CBC
HCT: 38.2 % — ABNORMAL LOW (ref 39.0–52.0)
Hemoglobin: 12.1 g/dL — ABNORMAL LOW (ref 13.0–17.0)
MCH: 27.8 pg (ref 26.0–34.0)
MCHC: 31.7 g/dL (ref 30.0–36.0)
MCV: 87.8 fL (ref 80.0–100.0)
Platelets: 214 K/uL (ref 150–400)
RBC: 4.35 MIL/uL (ref 4.22–5.81)
RDW: 14.8 % (ref 11.5–15.5)
WBC: 9.5 K/uL (ref 4.0–10.5)
nRBC: 0 % (ref 0.0–0.2)

## 2024-06-21 LAB — APTT: aPTT: 173 s (ref 24–36)

## 2024-06-21 LAB — POCT ACTIVATED CLOTTING TIME
Activated Clotting Time: 250 s
Activated Clotting Time: 279 s

## 2024-06-21 LAB — HEPARIN LEVEL (UNFRACTIONATED): Heparin Unfractionated: 0.76 [IU]/mL — ABNORMAL HIGH (ref 0.30–0.70)

## 2024-06-21 LAB — PROTIME-INR
INR: 1 (ref 0.8–1.2)
Prothrombin Time: 14.1 s (ref 11.4–15.2)

## 2024-06-21 SURGERY — RIGHT/LEFT HEART CATH AND CORONARY ANGIOGRAPHY
Anesthesia: Moderate Sedation

## 2024-06-21 MED ORDER — FENTANYL CITRATE (PF) 100 MCG/2ML IJ SOLN
INTRAMUSCULAR | Status: DC | PRN
Start: 2024-06-21 — End: 2024-06-21
  Administered 2024-06-21 (×2): 12.5 ug via INTRAVENOUS

## 2024-06-21 MED ORDER — FENTANYL CITRATE (PF) 100 MCG/2ML IJ SOLN
INTRAMUSCULAR | Status: AC
Start: 2024-06-21 — End: 2024-06-21
  Filled 2024-06-21: qty 2

## 2024-06-21 MED ORDER — VERAPAMIL HCL 2.5 MG/ML IV SOLN
INTRAVENOUS | Status: DC | PRN
  Administered 2024-06-21 (×2): 2.5 mg via INTRA_ARTERIAL

## 2024-06-21 MED ORDER — CLOPIDOGREL BISULFATE 75 MG PO TABS
ORAL_TABLET | ORAL | Status: AC
  Filled 2024-06-21: qty 8

## 2024-06-21 MED ORDER — NITROGLYCERIN 1 MG/10 ML FOR IR/CATH LAB
INTRA_ARTERIAL | Status: AC
  Filled 2024-06-21: qty 10

## 2024-06-21 MED ORDER — AMLODIPINE BESYLATE 5 MG PO TABS
ORAL_TABLET | ORAL | Status: AC
  Filled 2024-06-21: qty 1

## 2024-06-21 MED ORDER — ASPIRIN 81 MG PO CHEW: 81.0000 mg | CHEWABLE_TABLET | ORAL | Status: DC

## 2024-06-21 MED ORDER — HEPARIN SODIUM (PORCINE) 1000 UNIT/ML IJ SOLN
INTRAMUSCULAR | Status: AC
  Filled 2024-06-21: qty 10

## 2024-06-21 MED ORDER — ONDANSETRON HCL 4 MG/2ML IJ SOLN: 4.0000 mg | Freq: Four times a day (QID) | INTRAMUSCULAR | Status: DC | PRN

## 2024-06-21 MED ORDER — FUROSEMIDE 10 MG/ML IJ SOLN
INTRAMUSCULAR | Status: AC
  Administered 2024-06-21: 40 mg via INTRAVENOUS
  Filled 2024-06-21: qty 4

## 2024-06-21 MED ORDER — MIDAZOLAM HCL 2 MG/2ML IJ SOLN
INTRAMUSCULAR | Status: AC
  Filled 2024-06-21: qty 2

## 2024-06-21 MED ORDER — AMLODIPINE BESYLATE 5 MG PO TABS
5.0000 mg | ORAL_TABLET | Freq: Every day | ORAL | Status: DC
  Administered 2024-06-21 – 2024-06-22 (×2): 5 mg via ORAL
  Filled 2024-06-21: qty 1

## 2024-06-21 MED ORDER — FUROSEMIDE 10 MG/ML IJ SOLN: 40.0000 mg | Freq: Once | INTRAMUSCULAR | Status: AC

## 2024-06-21 MED ORDER — ACETAMINOPHEN 325 MG PO TABS
650.0000 mg | ORAL_TABLET | ORAL | Status: AC | PRN
Start: 2024-06-21 — End: ?

## 2024-06-21 MED ORDER — ASPIRIN 81 MG PO TBEC
81.0000 mg | DELAYED_RELEASE_TABLET | Freq: Every day | ORAL | Status: DC
  Administered 2024-06-22 – 2024-06-25 (×3): 81 mg via ORAL
  Filled 2024-06-21 (×4): qty 1

## 2024-06-21 MED ORDER — SODIUM CHLORIDE 0.9% FLUSH
3.0000 mL | INTRAVENOUS | Status: DC | PRN
  Administered 2024-06-24: 10 mL via INTRAVENOUS

## 2024-06-21 MED ORDER — HEPARIN (PORCINE) 25000 UT/250ML-% IV SOLN
1200.0000 [IU]/h | INTRAVENOUS | Status: DC
  Administered 2024-06-21: 1200 [IU]/h via INTRAVENOUS
  Filled 2024-06-21: qty 250

## 2024-06-21 MED ORDER — PRESERVISION AREDS 2 PO CAPS: 1.0000 | ORAL_CAPSULE | Freq: Two times a day (BID) | ORAL | Status: DC

## 2024-06-21 MED ORDER — CLOPIDOGREL BISULFATE 75 MG PO TABS
ORAL_TABLET | ORAL | Status: AC
  Filled 2024-06-21: qty 7

## 2024-06-21 MED ORDER — HEPARIN (PORCINE) IN NACL 1000-0.9 UT/500ML-% IV SOLN
INTRAVENOUS | Status: DC | PRN
  Administered 2024-06-21: 1000 mL

## 2024-06-21 MED ORDER — MIDAZOLAM HCL 2 MG/2ML IJ SOLN
INTRAMUSCULAR | Status: DC | PRN
  Administered 2024-06-21 (×2): .5 mg via INTRAVENOUS

## 2024-06-21 MED ORDER — ACETAMINOPHEN 325 MG PO TABS: 650.0000 mg | ORAL_TABLET | Freq: Four times a day (QID) | ORAL | Status: DC | PRN

## 2024-06-21 MED ORDER — HYDRALAZINE HCL 20 MG/ML IJ SOLN
10.0000 mg | INTRAMUSCULAR | Status: AC | PRN
Start: 2024-06-21 — End: 2024-06-21

## 2024-06-21 MED ORDER — HEPARIN (PORCINE) IN NACL 1000-0.9 UT/500ML-% IV SOLN
INTRAVENOUS | Status: AC
  Filled 2024-06-21: qty 1000

## 2024-06-21 MED ORDER — CLOPIDOGREL BISULFATE 75 MG PO TABS
ORAL_TABLET | ORAL | Status: DC | PRN
Start: 2024-06-21 — End: 2024-06-21
  Administered 2024-06-21: 600 mg via ORAL

## 2024-06-21 MED ORDER — SODIUM CHLORIDE 0.9 % WEIGHT BASED INFUSION
1.0000 mL/kg/h | INTRAVENOUS | Status: DC
  Administered 2024-06-21: 1 mL/kg/h via INTRAVENOUS

## 2024-06-21 MED ORDER — LIDOCAINE HCL (PF) 1 % IJ SOLN
INTRAMUSCULAR | Status: DC | PRN
  Administered 2024-06-21 (×2): 2 mL

## 2024-06-21 MED ORDER — HEPARIN SODIUM (PORCINE) 5000 UNIT/ML IJ SOLN: 5000.0000 [IU] | Freq: Two times a day (BID) | INTRAMUSCULAR | Status: DC

## 2024-06-21 MED ORDER — SODIUM CHLORIDE 0.9% FLUSH
3.0000 mL | Freq: Two times a day (BID) | INTRAVENOUS | Status: DC
  Administered 2024-06-21 – 2024-06-23 (×6): 3 mL via INTRAVENOUS
  Administered 2024-06-24: 10 mL via INTRAVENOUS
  Administered 2024-06-24: 3 mL via INTRAVENOUS

## 2024-06-21 MED ORDER — EZETIMIBE 10 MG PO TABS
10.0000 mg | ORAL_TABLET | Freq: Every day | ORAL | Status: DC
  Administered 2024-06-22 – 2024-06-25 (×4): 10 mg via ORAL
  Filled 2024-06-21 (×4): qty 1

## 2024-06-21 MED ORDER — CLOPIDOGREL BISULFATE 75 MG PO TABS
75.0000 mg | ORAL_TABLET | Freq: Every day | ORAL | Status: DC
  Administered 2024-06-22 – 2024-06-25 (×4): 75 mg via ORAL
  Filled 2024-06-21 (×4): qty 1

## 2024-06-21 MED ORDER — VERAPAMIL HCL 2.5 MG/ML IV SOLN
INTRAVENOUS | Status: AC
  Filled 2024-06-21: qty 2

## 2024-06-21 MED ORDER — LEVOTHYROXINE SODIUM 50 MCG PO TABS
75.0000 ug | ORAL_TABLET | Freq: Every day | ORAL | Status: DC
  Administered 2024-06-22 – 2024-06-25 (×4): 75 ug via ORAL
  Filled 2024-06-21 (×2): qty 3
  Filled 2024-06-21 (×3): qty 1
  Filled 2024-06-21: qty 3
  Filled 2024-06-21: qty 1

## 2024-06-21 MED ORDER — IOHEXOL 300 MG/ML  SOLN
INTRAMUSCULAR | Status: DC | PRN
Start: 2024-06-21 — End: 2024-06-21
  Administered 2024-06-21: 71 mL

## 2024-06-21 MED ORDER — SODIUM CHLORIDE 0.9 % WEIGHT BASED INFUSION
3.0000 mL/kg/h | INTRAVENOUS | Status: AC
  Administered 2024-06-21: 3 mL/kg/h via INTRAVENOUS

## 2024-06-21 MED ORDER — NITROGLYCERIN 0.4 MG SL SUBL: 0.4000 mg | SUBLINGUAL_TABLET | SUBLINGUAL | Status: DC | PRN

## 2024-06-21 MED ORDER — SODIUM CHLORIDE 0.9% FLUSH
3.0000 mL | Freq: Two times a day (BID) | INTRAVENOUS | Status: DC
  Administered 2024-06-21 – 2024-06-22 (×4): 3 mL via INTRAVENOUS
  Administered 2024-06-23: 10 mL via INTRAVENOUS
  Administered 2024-06-23: 3 mL via INTRAVENOUS

## 2024-06-21 MED ORDER — SODIUM CHLORIDE 0.9 % IV SOLN: 250.0000 mL | INTRAVENOUS | Status: AC | PRN

## 2024-06-21 MED ORDER — NITROGLYCERIN 1 MG/10 ML FOR IR/CATH LAB
INTRA_ARTERIAL | Status: DC | PRN
Start: 2024-06-21 — End: 2024-06-21
  Administered 2024-06-21 (×2): 200 ug via INTRACORONARY

## 2024-06-21 MED ORDER — SODIUM CHLORIDE 0.9% FLUSH: 3.0000 mL | INTRAVENOUS | Status: DC | PRN

## 2024-06-21 MED ORDER — LIDOCAINE HCL 1 % IJ SOLN
INTRAMUSCULAR | Status: AC
  Filled 2024-06-21: qty 20

## 2024-06-21 MED ORDER — HEPARIN SODIUM (PORCINE) 1000 UNIT/ML IJ SOLN
INTRAMUSCULAR | Status: DC | PRN
  Administered 2024-06-21: 3000 [IU] via INTRAVENOUS
  Administered 2024-06-21: 2000 [IU] via INTRAVENOUS
  Administered 2024-06-21 (×2): 4500 [IU] via INTRAVENOUS

## 2024-06-21 MED ORDER — SODIUM CHLORIDE 0.9 % IV SOLN
250.0000 mL | INTRAVENOUS | Status: AC | PRN
Start: 2024-06-21 — End: 2024-06-22

## 2024-06-21 MED ORDER — METOPROLOL SUCCINATE ER 25 MG PO TB24
25.0000 mg | ORAL_TABLET | Freq: Every day | ORAL | Status: DC
  Administered 2024-06-22 – 2024-06-25 (×4): 25 mg via ORAL
  Filled 2024-06-21 (×4): qty 1

## 2024-06-21 MED ORDER — ACETAMINOPHEN 325 MG PO TABS
650.0000 mg | ORAL_TABLET | ORAL | Status: DC | PRN
Start: 2024-06-21 — End: 2024-06-21

## 2024-06-21 SURGICAL SUPPLY — 17 items
BALLOON TREK RX 2.5X12 (BALLOONS) IMPLANT
BALLOON ~~LOC~~ TREK NEO RX 3.75X15 (BALLOONS) IMPLANT
CATH BALLN WEDGE 5F 110CM (CATHETERS) IMPLANT
CATH INFINITI 5 FR IM (CATHETERS) IMPLANT
CATH INFINITI 5 FR MPA2 (CATHETERS) IMPLANT
CATH LAUNCHER 6FR JR4 (CATHETERS) IMPLANT
DEVICE RAD TR BAND REGULAR (VASCULAR PRODUCTS) IMPLANT
DRAPE BRACHIAL (DRAPES) IMPLANT
GLIDESHEATH SLEND SS 6F .021 (SHEATH) IMPLANT
GUIDEWIRE INQWIRE 1.5J.035X260 (WIRE) IMPLANT
KIT ENCORE 26 ADVANTAGE (KITS) IMPLANT
PACK CARDIAC CATH (CUSTOM PROCEDURE TRAY) ×2 IMPLANT
SET ATX-X65L (MISCELLANEOUS) IMPLANT
SHEATH GLIDE SLENDER 4/5FR (SHEATH) IMPLANT
STATION PROTECTION PRESSURIZED (MISCELLANEOUS) IMPLANT
STENT ONYX FRONTIER 3.5X18 (Permanent Stent) IMPLANT
WIRE RUNTHROUGH .014X180CM (WIRE) IMPLANT

## 2024-06-21 NOTE — Progress Notes (Signed)
 STAT labs drawn; 12 lead EKG done. Pt. Voided immed. Upon arrival. Pt. Sitting up on side of bed with arm elevated, eating snack and drinking fluids. No cardiac or subjective c/o at present. Wife at bedside with pt.

## 2024-06-21 NOTE — Interval H&P Note (Signed)
 History and Physical Interval Note:  06/21/2024 12:31 PM  Tyler David  has presented today for surgery, with the diagnosis of CAD, HFrEF, and abnormal stress test.  The various methods of treatment have been discussed with the patient and family. After consideration of risks, benefits and other options for treatment, the patient has consented to  Procedure(s): RIGHT/LEFT HEART CATH AND CORONARY ANGIOGRAPHY (Bilateral) as a surgical intervention.  The patient's history has been reviewed, patient examined, no change in status, stable for surgery.  I have reviewed the patient's chart and labs.  Questions were answered to the patient's satisfaction.    Cath Lab Visit (complete for each Cath Lab visit)  Clinical Evaluation Leading to the Procedure:   ACS: No.  Non-ACS:    Anginal/Heart Failure Classification: NYHA class III  Anti-ischemic medical therapy: Maximal Therapy (2 or more classes of medications)  Non-Invasive Test Results: High-risk stress test findings: cardiac mortality >3%/year  Prior CABG: Previous CABG  Tyler David

## 2024-06-21 NOTE — Progress Notes (Signed)
 Dr. Mady came by, reviewed labs, spoke with pt. And his wife. New orders received to resume Norvasc  p.o. Left radial site reviewed by MD.

## 2024-06-21 NOTE — Progress Notes (Signed)
 Dr. Mady at bedside, speaking with pt. And his wife re: cath procedure. Both verbalized understanding of conversation.

## 2024-06-21 NOTE — Consult Note (Signed)
 Pharmacy Consult Note - Anticoagulation  Pharmacy Consult for Heparin  Indication: atrial fibrillation Allergies  Allergen Reactions   Lipitor [Atorvastatin] Other (See Comments)    Myalgias    Pravachol [Pravastatin] Other (See Comments)    Myalgias    Statins Other (See Comments)    Myalgias    Zocor [Simvastatin] Other (See Comments)    Myalgias    Penicillins Rash    Other Reaction(s): HIVES, Unknown    PATIENT MEASUREMENTS: Height: 5' 7.5 (171.5 cm) Weight: 88 kg (193 lb 14.4 oz) IBW/kg (Calculated) : 67.25 HEPARIN  DW (KG): 85.2  VITAL SIGNS: Temp: 97.8 F (36.6 C) (09/05 1430) Temp Source: Tympanic (09/05 1430) BP: 165/68 (09/05 1530) Pulse Rate: 72 (09/05 1515)  Recent Labs    06/21/24 0822 06/21/24 1303 06/21/24 1442  HGB  --    < > 12.1*  HCT  --    < > 38.2*  PLT  --   --  214  LABPROT 14.1  --   --   INR 1.0  --   --   CREATININE  --   --  1.85*   < > = values in this interval not displayed.    Estimated Creatinine Clearance: 34.6 mL/min (A) (by C-G formula based on SCr of 1.85 mg/dL (H)).  PAST MEDICAL HISTORY: Past Medical History:  Diagnosis Date   Arthritis    Cancer (HCC)    prostate cancer    Cardiomyopathy (HCC)    Chronic kidney disease, stage 3 (HCC)    Coronary artery disease    ED (erectile dysfunction)    GERD (gastroesophageal reflux disease)    Headache    hx of migraine-2015    Hyperlipidemia    Hypertension    Hypothyroidism    LBBB (left bundle branch block)    Migraine    Nocturia    Overactive bladder    Pneumonia    hx of at age 27     Medications:  Medications Prior to Admission  Medication Sig Dispense Refill Last Dose/Taking   amLODipine  (NORVASC ) 5 MG tablet Take 1 tablet (5 mg total) by mouth daily. (Patient taking differently: Take 5 mg by mouth every evening.) 90 tablet 3 06/20/2024 Evening   apixaban  (ELIQUIS ) 5 MG TABS tablet Take 1 tablet (5 mg total) by mouth 2 (two) times daily. 180 tablet 3 06/18/2024  Evening   aspirin  EC 81 MG tablet Take 1 tablet (81 mg total) by mouth daily. Swallow whole.   06/21/2024 at  6:15 AM   cholecalciferol  (VITAMIN D3) 25 MCG (1000 UNIT) tablet Take 1 tablet (1,000 Units total) by mouth daily. (Patient taking differently: Take 1,000 Units by mouth every evening.) 30 tablet 0 06/20/2024 Evening   ezetimibe  (ZETIA ) 10 MG tablet Take 1 tablet (10 mg total) by mouth daily. 90 tablet 3 06/21/2024 at  6:15 AM   famotidine  (PEPCID ) 20 MG tablet Take 1 tablet (20 mg total) by mouth every evening. (Patient taking differently: Take 20 mg by mouth every 3 (three) days. Taking in the evening) 30 tablet 0 06/20/2024 Evening   furosemide  (LASIX ) 40 MG tablet Take 1 tablet (40 mg total) by mouth daily as needed (for increased shortness of breath, lower extremity swelling, or weight gain greater than 3 pounds overnight or 5 pounds in a 7-day time span). 30 tablet 6 06/18/2024   levothyroxine  (SYNTHROID ) 75 MCG tablet Take 75 mcg by mouth every morning.   06/21/2024 Morning   MAGNESIUM  PO Take 1 tablet by  mouth at bedtime.   06/20/2024 Bedtime   metoprolol  succinate (TOPROL -XL) 25 MG 24 hr tablet Take 1 tablet (25 mg total) by mouth daily. Take with or immediately following a meal. 90 tablet 3 06/21/2024 at  6:15 AM   Multiple Vitamins-Minerals (PRESERVISION AREDS 2) CAPS Take 1 capsule by mouth in the morning and at bedtime.   06/20/2024 Evening   Omega-3 Fatty Acids (FISH OIL) 1000 MG CAPS Take 1,000 mg by mouth daily.   06/20/2024 Morning   Bempedoic Acid  (NEXLETOL ) 180 MG TABS Take 1 tablet (180 mg total) by mouth daily. (Patient not taking: No sig reported) 90 tablet 3 Not Taking   nitroGLYCERIN  (NITROSTAT ) 0.4 MG SL tablet Place 1 tablet (0.4 mg total) under the tongue every 5 (five) minutes as needed for chest pain. (Patient not taking: Reported on 06/21/2024) 25 tablet 0 Not Taking   Scheduled:   [START ON 06/22/2024] aspirin   81 mg Oral Pre-Cath   [START ON 06/22/2024] aspirin  EC  81 mg Oral Daily    [START ON 06/22/2024] clopidogrel   75 mg Oral Q breakfast   [START ON 06/22/2024] ezetimibe   10 mg Oral Daily   [START ON 06/22/2024] levothyroxine   75 mcg Oral Q0600   [START ON 06/22/2024] metoprolol  succinate  25 mg Oral Daily   PreserVision AREDS 2  1 capsule Oral BID   sodium chloride  flush  3 mL Intravenous Q12H   sodium chloride  flush  3 mL Intravenous Q12H   Infusions:   sodium chloride      sodium chloride      sodium chloride  Stopped (06/21/24 1513)   PRN: sodium chloride , sodium chloride , acetaminophen , clopidogrel , fentaNYL , Heparin  (Porcine) in NaCl, heparin  sodium (porcine), hydrALAZINE , iohexol , lidocaine  (PF), midazolam , nitroGLYCERIN , nitroGLYCERIN , ondansetron  (ZOFRAN ) IV, sodium chloride  flush, sodium chloride  flush, verapamil   ASSESSMENT: 78 y.o. 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 Patient is Abixaban per chart review. Pharmacy has been consulted to initiate and manage heparin  intravenous infusion. Patient received x5 doses of heparin  1,000 units during procedure.    Goal(s) of therapy: Heparin  level 0.3 - 0.7 units/mL aPTT 66 - 102 seconds Monitor platelets by anticoagulation protocol: Yes   Baseline anticoagulation labs: Recent Labs    06/21/24 0822 06/21/24 1303 06/21/24 1307 06/21/24 1442  INR 1.0  --   --   --   HGB  --  11.9* 11.9* 12.1*  PLT  --   --   --  214    PLAN:  Start heparin  infusion at 1200 units/hour.  Check aPTT in 8 hours.  Continue to titrate by aPTT until heparin  level and aPTT correlate, then titrate by heparin  level alone.  Check heparin  level with next AM labs.  Continue to monitor CBC daily while on heparin  infusion.   Annabella LOISE Banks, PharmD Clinical Pharmacist 06/21/2024 3:45 PM

## 2024-06-21 NOTE — H&P (Signed)
 History and Physical    Tyler David FMW:[id number] DOB: April 23, 1946 DOA: 06/21/2024  PCP: Arloa Elsie SAUNDERS, MD (Confirm with patient/family/NH records and if not entered, this has to be entered at [facility] point of entry) Patient coming from: Home  I have personally briefly reviewed patient's old medical records in [facility]  Chief Complaint: SOB, ankle swelling  HPI: Tyler David is a 78 y.o. 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  Patient has a history of ischemic cardiomyopathy, chronic HFrEF, earlier this year with worsening of exertional dyspnea, went for cath and found three-vessel disease, in March 2025 patient underwent three-vessel triple bypass.  After discharge patient went for inpatient PT he tolerated well and went home.  Recent weeks, patient started to experience worsening of exertional dyspnea  just like before had CABG, he was started on Lasix  daily however given worsening kidney function Lasix  changed to as needed.  On laboratory he takes 2-3 times Lasix  a week but his breathing symptoms and ankle swelling no improvement.  Today, cardiology performed a LHC and RHC which found the patient has had significant critical stenosis in RCA and LAX bypasses graft, and 1 stent placed in in RCA today, RHC showed increasing of PCWP.  Cardiology plan for IV Lasix  while watching kidney function and staged LHC on Monday-Tuesday to put another stent in LCx.   Review of Systems: As per HPI otherwise 14 point review of systems negative.    Past Medical History:  Diagnosis Date   Arthritis    Cancer (HCC)    prostate cancer    Cardiomyopathy (HCC)    Chronic kidney disease, stage 3 (HCC)    Coronary artery disease    ED (erectile dysfunction)    GERD (gastroesophageal reflux  disease)    Headache    hx of migraine-2015    Hyperlipidemia    Hypertension    Hypothyroidism    LBBB (left bundle branch block)    Migraine    Nocturia    Overactive bladder    Pneumonia    hx of at age 64     Past Surgical History:  Procedure Laterality Date   CARDIAC CATHETERIZATION     CARPAL TUNNEL RELEASE Bilateral    CORONARY ARTERY BYPASS GRAFT N/A 01/04/2024   Procedure: CORONARY ARTERY BYPASS GRAFTING TIMES THREE USING LEFT INTERNAL MAMMARY ARTERY AND RIGHT GREAT SAPHENOUS VEIN HARVESTED ENDOSCOPICALLY;  Surgeon: Shyrl Linnie KIDD, MD;  Location: [facility] OR;  Service: Open Heart Surgery;  Laterality: N/A;   INTRAOPERATIVE TRANSESOPHAGEAL ECHOCARDIOGRAM N/A 01/04/2024   Procedure: ECHOCARDIOGRAM, TRANSESOPHAGEAL, INTRAOPERATIVE;  Surgeon: Shyrl Linnie KIDD, MD;  Location: [facility] OR;  Service: Open Heart Surgery;  Laterality: N/A;   PROSTATE SURGERY     RIGHT/LEFT HEART CATH AND CORONARY ANGIOGRAPHY Bilateral 12/15/2023   Procedure: RIGHT/LEFT HEART CATH AND CORONARY ANGIOGRAPHY;  Surgeon: Mady Bruckner, MD;  Location: [facility] INVASIVE CV LAB;  Service: Cardiovascular;  Laterality: Bilateral;   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   Procedure: LEFT TOTAL HIP ARTHROPLASTY ANTERIOR APPROACH;  Surgeon: Donnice Car, MD;  Location: [facility] ORS;  Service: Orthopedics;  Laterality: Left;     reports that he has never smoked. He has never used smokeless tobacco. He reports that he does not currently use alcohol. He reports that he does not use drugs.  Allergies  Allergen Reactions   Lipitor [Atorvastatin] Other (See Comments)    Myalgias    Pravachol [Pravastatin] Other (See Comments)    Myalgias    Statins Other (See Comments)    Myalgias    Zocor [Simvastatin] Other (See Comments)    Myalgias    Penicillins Rash    Other Reaction(s): HIVES, Unknown    Family History  Problem Relation Age of Onset   Ovarian cancer Mother    Heart failure Father    COPD Father    Heart  attack Sister    Atrial fibrillation Brother    Prostate cancer Brother    Heart disease Maternal Grandmother    Heart disease Maternal Grandfather    Heart disease Paternal Grandmother    Heart disease Paternal Grandfather      Prior to Admission medications   Medication Sig Start Date End Date Taking? Authorizing Provider  amLODipine  (NORVASC ) 5 MG tablet Take 1 tablet (5 mg total) by mouth daily. Patient taking differently: Take 5 mg by mouth every evening. 01/31/24  Yes Dunn, Bernardino HERO, PA-C  apixaban  (ELIQUIS ) 5 MG TABS tablet Take 1 tablet (5 mg total) by mouth 2 (two) times daily. 03/12/24  Yes Dunn, Bernardino HERO, PA-C  aspirin  EC 81 MG tablet Take 1 tablet (81 mg total) by mouth daily. Swallow whole. 01/12/24  Yes Barrett, Erin R, PA-C  cholecalciferol  (VITAMIN D3) 25 MCG (1000 UNIT) tablet Take 1 tablet (1,000 Units total) by mouth daily. Patient taking differently: Take 1,000 Units by mouth every evening. 01/23/24  Yes Angiulli, Toribio PARAS, PA-C  ezetimibe  (ZETIA ) 10 MG tablet Take 1 tablet (10 mg total) by mouth daily. 03/12/24  Yes Dunn, Bernardino HERO, PA-C  famotidine  (PEPCID ) 20 MG tablet Take 1 tablet (20 mg total) by mouth every evening. Patient taking differently: Take 20 mg by mouth every 3 (three) days. Taking in the evening 01/23/24  Yes Angiulli, Toribio PARAS, PA-C  furosemide  (LASIX ) 40 MG tablet Take 1 tablet (40 mg total) by mouth daily as needed (for increased shortness of breath, lower extremity swelling, or weight gain greater than 3 pounds overnight or 5 pounds in a 7-day time span). 02/01/24  Yes Dunn, Bernardino HERO, PA-C  levothyroxine  (SYNTHROID ) 75 MCG tablet Take 75 mcg by mouth every morning.   Yes [provider]  MAGNESIUM  PO Take 1 tablet by mouth at bedtime.   Yes [provider]  metoprolol  succinate (TOPROL -XL) 25 MG 24 hr tablet Take 1 tablet (25 mg total) by mouth daily. Take with or immediately following a meal. 03/12/24  Yes Dunn, Bernardino HERO, PA-C  Multiple  Vitamins-Minerals (PRESERVISION AREDS 2) CAPS Take 1 capsule by mouth in the morning and at bedtime.   Yes [provider]  Omega-3 Fatty Acids (FISH OIL) 1000 MG CAPS Take 1,000 mg by mouth daily.   Yes [provider]  Bempedoic Acid  (NEXLETOL ) 180 MG TABS Take 1 tablet (180 mg total) by mouth daily. Patient not taking: No sig reported 03/12/24   Abigail Bernardino HERO, PA-C  nitroGLYCERIN  (NITROSTAT ) 0.4 MG SL tablet Place 1 tablet (0.4 mg total) under the tongue every 5 (five) minutes as needed for chest pain. Patient not taking: Reported on 06/21/2024 01/23/24 01/22/25  Pegge Toribio PARAS, PA-C    Physical Exam: Vitals:   06/21/24 0819 06/21/24 1000 06/21/24 1050 06/21/24 1430  BP: (!) 163/80  (!) 144/75 100/81  Pulse: 71   73  Resp: 15 18  16   Temp:  98 F (36.7 C) 97.8 F (36.6 C)  TempSrc:   Oral Tympanic  SpO2: 98%  96% 95%  Weight:      Height:        Constitutional: NAD, calm, comfortable Vitals:   06/21/24 0819 06/21/24 1000 06/21/24 1050 06/21/24 1430  BP: (!) 163/80  (!) 144/75 100/81  Pulse: 71   73  Resp: 15 18  16   Temp:   98 F (36.7 C) 97.8 F (36.6 C)  TempSrc:   Oral Tympanic  SpO2: 98%  96% 95%  Weight:      Height:       Eyes: PERRL, lids and conjunctivae normal ENMT: Mucous membranes are moist. Posterior pharynx clear of any exudate or lesions.Normal dentition.  Neck: normal, supple, no masses, no thyromegaly Respiratory: clear to auscultation bilaterally, no wheezing, no crackles. Normal respiratory effort. No accessory muscle use.  Cardiovascular: Regular rate and rhythm, no murmurs / rubs / gallops. 2+ extremity edema. 2+ pedal pulses. No carotid bruits.  Abdomen: no tenderness, no masses palpated. No hepatosplenomegaly. Bowel sounds positive.  Musculoskeletal: no clubbing / cyanosis. No joint deformity upper and lower extremities. Good ROM, no contractures. Normal muscle tone.  Skin: no rashes, lesions, ulcers. No induration Neurologic: CN  2-12 grossly intact. Sensation intact, DTR normal. Strength 5/5 in all 4.  Psychiatric: Normal judgment and insight. Alert and oriented x 3. Normal mood.    Labs on Admission: I have personally reviewed following labs and imaging studies  CBC: Recent Labs  Lab 06/14/24 1523 06/21/24 1303 06/21/24 1307  WBC 8.9  --   --   HGB 12.8* 11.9* 11.9*  HCT 41.4 35.0* 35.0*  MCV 90  --   --   PLT 228  --   --    Basic Metabolic Panel: Recent Labs  Lab 06/14/24 1523 06/21/24 1303 06/21/24 1307  NA 144 145 144  K 4.5 4.0 4.0  CL 105  --   --   CO2 22  --   --   GLUCOSE 94  --   --   BUN 29*  --   --   CREATININE 1.92*  --   --   CALCIUM  8.9  --   --    GFR: Estimated Creatinine Clearance: 33.4 mL/min (A) (by C-G formula based on SCr of 1.92 mg/dL (H)). Liver Function Tests: No results for input(s): AST, ALT, ALKPHOS, BILITOT, PROT, ALBUMIN  in the last 168 hours. No results for input(s): LIPASE, AMYLASE in the last 168 hours. No results for input(s): AMMONIA in the last 168 hours. Coagulation Profile: Recent Labs  Lab 06/21/24 0822  INR 1.0   Cardiac Enzymes: No results for input(s): CKTOTAL, CKMB, CKMBINDEX, TROPONINI in the last 168 hours. BNP (last 3 results) No results for input(s): PROBNP in the last 8760 hours. HbA1C: No results for input(s): HGBA1C in the last 72 hours. CBG: No results for input(s): GLUCAP in the last 168 hours. Lipid Profile: No results for input(s): CHOL, HDL, LDLCALC, TRIG, CHOLHDL, LDLDIRECT in the last 72 hours. Thyroid  Function Tests: No results for input(s): TSH, T4TOTAL, FREET4, T3FREE, THYROIDAB in the last 72 hours. Anemia Panel: No results for input(s): VITAMINB12, FOLATE, FERRITIN, TIBC, IRON, RETICCTPCT in the last 72 hours. Urine analysis:    Component Value Date/Time   COLORURINE YELLOW 01/02/2024 1330   APPEARANCEUR CLEAR 01/02/2024 1330   LABSPEC 1.015  01/02/2024 1330   PHURINE 6.0 01/02/2024 1330   GLUCOSEU NEGATIVE 01/02/2024 1330   HGBUR NEGATIVE  01/02/2024 1330   BILIRUBINUR NEGATIVE 01/02/2024 1330   KETONESUR NEGATIVE 01/02/2024 1330   PROTEINUR 100 (A) 01/02/2024 1330   NITRITE NEGATIVE 01/02/2024 1330   LEUKOCYTESUR NEGATIVE 01/02/2024 1330    Radiological Exams on Admission: CARDIAC CATHETERIZATION Result Date: 06/21/2024   Mid LAD lesion is 70% stenosed.   Prox Cx to Mid Cx lesion is 90% stenosed.   1st Diag lesion is 50% stenosed.   RPAV lesion is 100% stenosed.   1st Mrg lesion is 50% stenosed.   Mid RCA lesion is 95% stenosed.   Origin to Insertion lesion is 100% stenosed.   Origin to Insertion lesion is 100% stenosed.   Dist RCA lesion is 40% stenosed.   A drug-eluting stent was successfully placed using a STENT ONYX FRONTIER [id number].   Post intervention, there is a 0% residual stenosis.   SVG graft was visualized by angiography.   SVG graft was visualized by angiography.   LIMA graft was visualized by angiography and is normal in caliber.   LV end diastolic pressure is moderately elevated.   There is no aortic valve stenosis.    EKG: Independently reviewed.  Sinus rhythm, chronic LBBB  Assessment/Plan Principal Problem:   HFrEF (heart failure with reduced ejection fraction) (HCC) Active Problems:   Chronic HFrEF (heart failure with reduced ejection fraction) (HCC)   Abnormal stress test   CHF (congestive heart failure) (HCC)  (please populate well all problems here in Problem List. (For example, if patient is on BP meds at home and you resume or decide to hold them, it is a problem that needs to be her. Same for CAD, COPD, HLD and so on)  Acute on chronic HFrEF decompensation Worsening of ischemic cardiomyopathy - Discussed with cardiology Dr. Mady, who recommended 1 dose of IV Lasix  today and monitor kidney function and volume status to decide further diuresis plan over the weekend and if kidney function remains stable  likely LHC on Monday-Tuesday to have a stent placed in the in LCx. - Ordered BMP and chest x-ray  Multivessel CAD status post CABG Failed bypass graft in RCA and Lcx Stable angina - Management as above -Given the critical finding, cardiology decided to start patient on heparin  drip -Continue aspirin  Plavix  and statin  HTN - Resume home BP meds  CKD stage IIIb - Creatinine level stable, volume overloaded, diuresis plan as above    DVT prophylaxis: Heparin  drip Code Status: Full code Family Communication: Wife at bedside Disposition Plan: Patient is sick with history of multivessel CAD with worsening of CHF, and critical stenosis of bypass graft requiring stenting and IV diuresis to treat CHF decompensation, expect more than 2 midnight hospital stay Consults called: Cardiology Admission status: Telemetry admission   Cort ONEIDA Mana MD [facility] Pager [phone]  06/21/2024, 2:49 PM

## 2024-06-22 DIAGNOSIS — I10 Essential (primary) hypertension: Secondary | ICD-10-CM | POA: Diagnosis not present

## 2024-06-22 DIAGNOSIS — I255 Ischemic cardiomyopathy: Secondary | ICD-10-CM

## 2024-06-22 DIAGNOSIS — Z951 Presence of aortocoronary bypass graft: Secondary | ICD-10-CM

## 2024-06-22 DIAGNOSIS — I4891 Unspecified atrial fibrillation: Secondary | ICD-10-CM

## 2024-06-22 DIAGNOSIS — E785 Hyperlipidemia, unspecified: Secondary | ICD-10-CM

## 2024-06-22 DIAGNOSIS — I2510 Atherosclerotic heart disease of native coronary artery without angina pectoris: Secondary | ICD-10-CM | POA: Diagnosis not present

## 2024-06-22 DIAGNOSIS — I5020 Unspecified systolic (congestive) heart failure: Secondary | ICD-10-CM | POA: Diagnosis not present

## 2024-06-22 DIAGNOSIS — I420 Dilated cardiomyopathy: Secondary | ICD-10-CM | POA: Diagnosis not present

## 2024-06-22 DIAGNOSIS — N183 Chronic kidney disease, stage 3 unspecified: Secondary | ICD-10-CM

## 2024-06-22 LAB — CBC
HCT: 36.8 % — ABNORMAL LOW (ref 39.0–52.0)
HCT: 39 % (ref 39.0–52.0)
Hemoglobin: 11.9 g/dL — ABNORMAL LOW (ref 13.0–17.0)
Hemoglobin: 12.8 g/dL — ABNORMAL LOW (ref 13.0–17.0)
MCH: 27.7 pg (ref 26.0–34.0)
MCH: 27.8 pg (ref 26.0–34.0)
MCHC: 32.3 g/dL (ref 30.0–36.0)
MCHC: 32.8 g/dL (ref 30.0–36.0)
MCV: 85.6 fL (ref 80.0–100.0)
MCV: 84.8 fL (ref 80.0–100.0)
Platelets: 191 K/uL (ref 150–400)
Platelets: 208 K/uL (ref 150–400)
RBC: 4.3 MIL/uL (ref 4.22–5.81)
RBC: 4.6 MIL/uL (ref 4.22–5.81)
RDW: 14.7 % (ref 11.5–15.5)
RDW: 14.7 % (ref 11.5–15.5)
WBC: 9.3 K/uL (ref 4.0–10.5)
WBC: 9.6 K/uL (ref 4.0–10.5)
nRBC: 0 % (ref 0.0–0.2)
nRBC: 0 % (ref 0.0–0.2)

## 2024-06-22 LAB — BASIC METABOLIC PANEL WITH GFR
Anion gap: 9 (ref 5–15)
Anion gap: 9 (ref 5–15)
BUN: 38 mg/dL — ABNORMAL HIGH (ref 8–23)
BUN: 36 mg/dL — ABNORMAL HIGH (ref 8–23)
CO2: 25 mmol/L (ref 22–32)
CO2: 24 mmol/L (ref 22–32)
Calcium: 8 mg/dL — ABNORMAL LOW (ref 8.9–10.3)
Calcium: 8.4 mg/dL — ABNORMAL LOW (ref 8.9–10.3)
Chloride: 106 mmol/L (ref 98–111)
Chloride: 105 mmol/L (ref 98–111)
Creatinine, Ser: 2.04 mg/dL — ABNORMAL HIGH (ref 0.61–1.24)
Creatinine, Ser: 2 mg/dL — ABNORMAL HIGH (ref 0.61–1.24)
GFR, Estimated: 33 mL/min — ABNORMAL LOW (ref >60)
GFR, Estimated: 33 mL/min — ABNORMAL LOW (ref >60)
Glucose, Bld: 90 mg/dL (ref 70–99)
Glucose, Bld: 106 mg/dL — ABNORMAL HIGH (ref 70–99)
Potassium: 3.5 mmol/L (ref 3.5–5.1)
Potassium: 4.2 mmol/L (ref 3.5–5.1)
Sodium: 140 mmol/L (ref 135–145)
Sodium: 138 mmol/L (ref 135–145)

## 2024-06-22 LAB — HEPARIN LEVEL (UNFRACTIONATED): Heparin Unfractionated: 0.64 [IU]/mL (ref 0.30–0.70)

## 2024-06-22 LAB — APTT
aPTT: 180 s (ref 24–36)
aPTT: 84 s — ABNORMAL HIGH (ref 24–36)
aPTT: 94 s — ABNORMAL HIGH (ref 24–36)

## 2024-06-22 MED ORDER — POTASSIUM CHLORIDE CRYS ER 20 MEQ PO TBCR
40.0000 meq | EXTENDED_RELEASE_TABLET | Freq: Once | ORAL | Status: AC
  Administered 2024-06-22: 40 meq via ORAL
  Filled 2024-06-22: qty 2

## 2024-06-22 MED ORDER — HEPARIN (PORCINE) 25000 UT/250ML-% IV SOLN
1000.0000 [IU]/h | INTRAVENOUS | Status: DC
  Administered 2024-06-22 – 2024-06-23 (×3): 1000 [IU]/h via INTRAVENOUS
  Filled 2024-06-22 (×2): qty 250

## 2024-06-22 MED ORDER — FAMOTIDINE 20 MG PO TABS
20.0000 mg | ORAL_TABLET | Freq: Every day | ORAL | Status: DC | PRN
  Administered 2024-06-22 – 2024-06-23 (×2): 20 mg via ORAL
  Filled 2024-06-22 (×2): qty 1

## 2024-06-22 NOTE — Consult Note (Signed)
 Pharmacy Consult Note - Anticoagulation  Pharmacy Consult for Heparin  Indication: atrial fibrillation Allergies  Allergen Reactions   Lipitor [Atorvastatin] Other (See Comments)    Myalgias    Pravachol [Pravastatin] Other (See Comments)    Myalgias    Statins Other (See Comments)    Myalgias    Zocor [Simvastatin] Other (See Comments)    Myalgias    Penicillins Rash    Other Reaction(s): HIVES, Unknown    PATIENT MEASUREMENTS: Height: 5' 7.5 (171.5 cm) Weight: 85.4 kg (188 lb 4.4 oz) IBW/kg (Calculated) : 67.25 HEPARIN  DW (KG): 85.2  VITAL SIGNS: Temp: 98.2 F (36.8 C) (09/06 1516) BP: 144/70 (09/06 1516) Pulse Rate: 67 (09/06 1516)  Recent Labs    06/21/24 0822 06/21/24 1303 06/22/24 0500 06/22/24 1109 06/22/24 1523  HGB  --    < > 11.9* 12.8*  --   HCT  --    < > 36.8* 39.0  --   PLT  --    < > 191 208  --   APTT  --    < > 180*  --  84*  LABPROT 14.1  --   --   --   --   INR 1.0  --   --   --   --   HEPARINUNFRC  --    < > 0.64  --   --   CREATININE  --    < > 2.04* 2.00*  --    < > = values in this interval not displayed.    Estimated Creatinine Clearance: 31.6 mL/min (A) (by C-G formula based on SCr of 2 mg/dL (H)).  PAST MEDICAL HISTORY: Past Medical History:  Diagnosis Date   Arthritis    Cancer (HCC)    prostate cancer    Cardiomyopathy (HCC)    Chronic kidney disease, stage 3 (HCC)    Coronary artery disease    ED (erectile dysfunction)    GERD (gastroesophageal reflux disease)    Headache    hx of migraine-2015    Hyperlipidemia    Hypertension    Hypothyroidism    LBBB (left bundle branch block)    Migraine    Nocturia    Overactive bladder    Pneumonia    hx of at age 39     Medications:  Medications Prior to Admission  Medication Sig Dispense Refill Last Dose/Taking   amLODipine  (NORVASC ) 5 MG tablet Take 1 tablet (5 mg total) by mouth daily. (Patient taking differently: Take 5 mg by mouth every evening.) 90 tablet 3 06/20/2024  Evening   apixaban  (ELIQUIS ) 5 MG TABS tablet Take 1 tablet (5 mg total) by mouth 2 (two) times daily. 180 tablet 3 06/18/2024 Evening   aspirin  EC 81 MG tablet Take 1 tablet (81 mg total) by mouth daily. Swallow whole.   06/21/2024 at  6:15 AM   cholecalciferol  (VITAMIN D3) 25 MCG (1000 UNIT) tablet Take 1 tablet (1,000 Units total) by mouth daily. (Patient taking differently: Take 1,000 Units by mouth every evening.) 30 tablet 0 06/20/2024 Evening   ezetimibe  (ZETIA ) 10 MG tablet Take 1 tablet (10 mg total) by mouth daily. 90 tablet 3 06/21/2024 at  6:15 AM   famotidine  (PEPCID ) 20 MG tablet Take 1 tablet (20 mg total) by mouth every evening. (Patient taking differently: Take 20 mg by mouth every 3 (three) days. Taking in the evening) 30 tablet 0 06/20/2024 Evening   furosemide  (LASIX ) 40 MG tablet Take 1 tablet (40 mg total)  by mouth daily as needed (for increased shortness of breath, lower extremity swelling, or weight gain greater than 3 pounds overnight or 5 pounds in a 7-day time span). 30 tablet 6 06/18/2024   levothyroxine  (SYNTHROID ) 75 MCG tablet Take 75 mcg by mouth every morning.   06/21/2024 Morning   MAGNESIUM  PO Take 1 tablet by mouth at bedtime.   06/20/2024 Bedtime   metoprolol  succinate (TOPROL -XL) 25 MG 24 hr tablet Take 1 tablet (25 mg total) by mouth daily. Take with or immediately following a meal. 90 tablet 3 06/21/2024 at  6:15 AM   Multiple Vitamins-Minerals (PRESERVISION AREDS 2) CAPS Take 1 capsule by mouth in the morning and at bedtime.   06/20/2024 Evening   Omega-3 Fatty Acids (FISH OIL) 1000 MG CAPS Take 1,000 mg by mouth daily.   06/20/2024 Morning   Bempedoic Acid  (NEXLETOL ) 180 MG TABS Take 1 tablet (180 mg total) by mouth daily. (Patient not taking: No sig reported) 90 tablet 3 Not Taking   nitroGLYCERIN  (NITROSTAT ) 0.4 MG SL tablet Place 1 tablet (0.4 mg total) under the tongue every 5 (five) minutes as needed for chest pain. (Patient not taking: Reported on 06/21/2024) 25 tablet 0 Not  Taking   Scheduled:   amLODipine   5 mg Oral QHS   aspirin  EC  81 mg Oral Daily   clopidogrel   75 mg Oral Q breakfast   ezetimibe   10 mg Oral Daily   levothyroxine   75 mcg Oral Q0600   metoprolol  succinate  25 mg Oral Daily   sodium chloride  flush  3 mL Intravenous Q12H   sodium chloride  flush  3 mL Intravenous Q12H   Infusions:   heparin  1,000 Units/hr (06/22/24 0824)   PRN: acetaminophen , nitroGLYCERIN , ondansetron  (ZOFRAN ) IV, sodium chloride  flush, sodium chloride  flush  ASSESSMENT: 78 y.o. 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 Patient is Abixaban per chart review. Pharmacy has been consulted to initiate and manage heparin  intravenous infusion. Patient received x5 doses of heparin  1,000 units during procedure.    Goal(s) of therapy: Heparin  level 0.3 - 0.7 units/mL aPTT 66 - 102 seconds Monitor platelets by anticoagulation protocol: Yes   Baseline anticoagulation labs: Recent Labs    06/21/24 0822 06/21/24 1303 06/21/24 1442 06/21/24 1858 06/22/24 0500 06/22/24 1109 06/22/24 1523  APTT  --   --   --  173* 180*  --  84*  INR 1.0  --   --   --   --   --   --   HGB  --    < > 12.1*  --  11.9* 12.8*  --   PLT  --   --  214  --  191 208  --    < > = values in this interval not displayed.    09/06 0500 aPTT 180 supratherapeutic / HL 0.64 therapeutic, not correlating 09/06 1523 aPTT 84, therapeutic x1  PLAN: Continue heparin  infusion at 1000 units/hour. Recheck aPTT in 8 hours to confirm Continue to titrate by aPTT until heparin  level and aPTT correlate, then titrate by heparin  level alone. Check heparin  level with next AM labs. Continue to monitor CBC daily while on heparin  infusion.  Thank you for involving pharmacy in this patient's care.   Damien Napoleon, PharmD Clinical  Pharmacist 06/22/2024 4:52 PM

## 2024-06-22 NOTE — Progress Notes (Signed)
  Progress Note   Patient: Tyler David FMW:[id number] DOB: 1946/02/04 DOA: 06/21/2024     1 DOS: the patient was seen and examined on 06/22/2024   Brief hospital course: Tyler David is a 78 y.o. 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  She was given IV Lasix  for CHF exacerbation. Heart catheter was performed on 9/5, had a drug-eluting stent placed to mid RCA.  Planning for staged PCI to circumflex on Monday or Tuesday.  The plan is continue aspirin  and Plavix  while in the hospital, transition to Plavix  and apixaban  prior to discharge.  Patient currently is also on heparin  drip.   Principal Problem:   HFrEF (heart failure with reduced ejection fraction) (HCC) Active Problems:   Chronic HFrEF (heart failure with reduced ejection fraction) (HCC)   Abnormal stress test   CHF (congestive heart failure) (HCC)   Coronary artery disease involving native coronary artery of native heart without angina pectoris   Ischemic congestive cardiomyopathy (HCC)   Assessment and Plan: Acute on chronic systolic congestive heart failure. Ischemic cardiomyopathy. Patient received IV Lasix , volume status has been better.  Multivessel coronary disease status post CABG. Stable angina with failed bypass graft in RCA and left circumflex. Patient is status post PCI on RCA, currently still on aspirin , Plavix  and heparin  drip per cardiology. Planning repeat PCI on Monday Tuesday. Continue to follow patient in the hospital.  Essential hypertension. On amlodipine .  Chronic kidney disease stage IIIb. Continue to follow.      Subjective:  Patient doing well today, no chest pain or shortness of breath today.  Physical Exam: Vitals:   06/22/24 0500 06/22/24 0507 06/22/24 0726 06/22/24 1120  BP:   130/62 138/68 (!) 148/74  Pulse:  63 74 67  Resp:  20 16 19   Temp:  98.3 F (36.8 C) 98.5 F (36.9 C) 98 F (36.7 C)  TempSrc:      SpO2:  100% 99% 97%  Weight: 85.4 kg     Height:       General exam: Appears calm and comfortable  Respiratory system: Clear to auscultation. Respiratory effort normal. Cardiovascular system: S1 & S2 heard, RRR. No JVD, murmurs, rubs, gallops or clicks. No pedal edema. Gastrointestinal system: Abdomen is nondistended, soft and nontender. No organomegaly or masses felt. Normal bowel sounds heard. Central nervous system: Alert and oriented. No focal neurological deficits. Extremities: Symmetric 5 x 5 power. Skin: No rashes, lesions or ulcers Psychiatry: Judgement and insight appear normal. Mood & affect appropriate.    Data Reviewed:  Lab results reviewed.  Family Communication: Wife updated at bedside.  Disposition: Status is: Inpatient Remains inpatient appropriate because: Severity of disease, IV treatment     Time spent: 35 minutes  Author: Murvin Mana, MD 06/22/2024 12:12 PM  For on call review [email or web address].

## 2024-06-22 NOTE — Plan of Care (Signed)
  Problem: Education: Goal: Understanding of CV disease, CV risk reduction, and recovery process will improve Outcome: Progressing Goal: Individualized Educational Video(s) Outcome: Progressing   Problem: Activity: Goal: Ability to return to baseline activity level will improve Outcome: Progressing   Problem: Cardiovascular: Goal: Ability to achieve and maintain adequate cardiovascular perfusion will improve Outcome: Progressing   Problem: Education: Goal: Knowledge of General Education information will improve Description: Including pain rating scale, medication(s)/side effects and non-pharmacologic comfort measures Outcome: Progressing   Problem: Clinical Measurements: Goal: Ability to maintain clinical measurements within normal limits will improve Outcome: Progressing Goal: Will remain free from infection Outcome: Progressing Goal: Diagnostic test results will improve Outcome: Progressing Goal: Respiratory complications will improve Outcome: Progressing   Problem: Activity: Goal: Risk for activity intolerance will decrease Outcome: Progressing   Problem: Coping: Goal: Level of anxiety will decrease Outcome: Progressing   Problem: Safety: Goal: Ability to remain free from injury will improve Outcome: Progressing   Problem: Skin Integrity: Goal: Risk for impaired skin integrity will decrease Outcome: Progressing

## 2024-06-22 NOTE — Hospital Course (Signed)
 Tyler David is a 77 y.o. 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  She was given IV Lasix  for CHF exacerbation. Heart catheter was performed on 9/5, had a drug-eluting stent placed to mid RCA.  Planning for staged PCI to circumflex on Monday or Tuesday.  The plan is continue aspirin  and Plavix  while in the hospital, transition to Plavix  and apixaban  prior to discharge.  Patient currently is also on heparin  drip.

## 2024-06-22 NOTE — Plan of Care (Signed)

## 2024-06-22 NOTE — Progress Notes (Signed)
 Progress Note  Patient Name: Tyler David Date of Encounter: 06/22/2024 [facility] Cardiologist: Lonni Hanson, MD   Interval Summary    Patient underwent outpatient heart cath for abnormal stress test and low EF. He underwent PCI mRCA and was admitted post cath. Plan for staged PCI p/m Lcx prior to d/c.   Patient denies chest pain. Has some SOB. Cath site looks good. AM labs pending.   Vital Signs Vitals:   06/21/24 2300 06/22/24 0500 06/22/24 0507 06/22/24 0726  BP: (!) 137/54  130/62 138/68  Pulse: 66  63 74  Resp: (!) 22  20 16   Temp: 98.2 F (36.8 C)  98.3 F (36.8 C) 98.5 F (36.9 C)  TempSrc:      SpO2: 98%  100% 99%  Weight:  85.4 kg    Height:        Intake/Output Summary (Last 24 hours) at 06/22/2024 0933 Last data filed at 06/22/2024 0831 Gross per 24 hour  Intake 690 ml  Output 3226 ml  Net -2536 ml      06/22/2024    5:00 AM 06/21/2024    8:07 AM 06/14/2024    1:52 PM  Last 3 Weights  Weight (lbs) 188 lb 4.4 oz 193 lb 14.4 oz 193 lb 3.2 oz  Weight (kg) 85.4 kg 87.952 kg 87.635 kg      Telemetry/ECG  SR/SB HR 50-60s - Personally Reviewed  Physical Exam  GEN: No acute distress.   Neck: No JVD Cardiac: RRR, no murmurs, rubs, or gallops.  Respiratory: Clear to auscultation bilaterally. GI: Soft, nontender, non-distended  MS: No edema  Relevant CV studies: R/L heart cath Conclusions: Severe native coronary artery disease, as detailed below, including 70% mid LAD stenosis, 90% proximal/mid LCx lesion, 95% mid RCA stenosis, and chronic total occlusion of RPDA V with left-to-right and right to right collaterals. Widely patent LIMA-LAD. Occluded SVG-OM1 and SVG-RPDA. Moderately to severely elevated left heart, right heart, and pulmonary artery pressures. Mildly reduced Fick cardiac output/index. Successful PCI to mid RCA using Onyx frontier 3.5 x 18 mm drug-eluting stent with 0% residual stenosis and TIMI-3 flow.    Recommendations: Admit for post PCI care as well as diuresis and optimization of goal-directed medical therapy for acute on chronic HFrEF complicated by CKD. Anticipate staged PCI to proximal/mid LCx prior to discharge once volume status and renal function have been optimized. Initiate heparin  infusion 2 hours after TR band has been removed. Continue aspirin  and clopidogrel  while in the hospital.  Will transition to clopidogrel  and apixaban  prior to discharge. Aggressive secondary prevention.  Continue ezetimibe  as inpatient, with plans to transition to PCSK9 inhibitor after discharge given that bempedoic acid /ezetimibe  was not approved by his insurance.   Lonni Hanson, MD [facility]  PET stress test 05/2024   LV perfusion is abnormal. There is evidence of ischemia. There is no evidence of infarction. Defect 1: There is a large defect with severe reduction in uptake present in the apical to basal inferior and inferolateral location(s) that is reversible. There is abnormal wall motion in the defect area. Consistent with ischemia.   Rest left ventricular function is abnormal. Rest global function is moderately reduced. There was a single regional abnormality. Rest EF: 31%. Stress left ventricular function is abnormal. Stress global function is moderately reduced. There was a single regional abnormality. Stress EF: 43%. End diastolic cavity size is moderately enlarged. End systolic cavity size is moderately enlarged.   Myocardial blood flow was computed to be  0.58ml/g/min at rest and 0.70ml/g/min at stress. Global myocardial blood flow reserve was 1.21 and was highly abnormal.   Coronary calcium  assessment not performed due to prior revascularization. Prior CABG   Findings are consistent with ischemia. The study is high risk.   Highly abnormal MBFR but in setting of prior CABG MVFR in ischemic area only 0.77    Echo 04/2024    1. Left ventricular ejection fraction, by estimation, is 25 to  30%. The  left ventricle has severely decreased function. The left ventricle  demonstrates global hypokinesis. The left ventricular internal cavity size  was mildly dilated. Left ventricular  diastolic parameters are consistent with Grade II diastolic dysfunction  (pseudonormalization).   2. Right ventricular systolic function is normal. The right ventricular  size is normal. Tricuspid regurgitation signal is inadequate for assessing  PA pressure.   3. Left atrial size was moderately dilated.   4. The mitral valve is normal in structure. Mild mitral valve  regurgitation. No evidence of mitral stenosis.   5. Tricuspid valve regurgitation is mild to moderate.   6. The aortic valve is tricuspid. There is mild calcification of the  aortic valve. Aortic valve regurgitation is not visualized. Aortic valve  sclerosis/calcification is present, without any evidence of aortic  stenosis. Aortic valve mean gradient  measures 4.0 mmHg.   Comparison(s): EF has decreased from prior study. Distal anteroseptal wall  is dyskintic.    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 Given persistently reduced EF and abnormal stress test, cardiac cath indicated.  - R/L heart cath showed severe native CAD, including 70% mLAD stenosis, 90% p/m Lcx lesion, 95% mRCA stenosis, and CTO RPDA V with L>R collaterals, patent LIMA-LAD, occluded SVG-OM1 and SVG to RPDA. Moderately elevated left and right heart pressures. Mildly reduced CO/CI - patient was treated with PCI mRCA and admitted post-cath. Plan staged PCI to p/m Lcx prior to discharge pending renal function - IV heparin  - continue ASA 81mg  daily and PLAvix  75mg  daily. Anticipate Eliquis  and Plavix  at d/c - metoprolol  25mg  daily, amlodipine  5mg  daily - he was given IV lasix  40mg  9/5. patient appears euvolemic - follow kidney function with  daily BMET - plan for repeat heart cath Monday or Tuesday  Post-op Afib - CHADSVASC at least 5 - in NSR - Eliquis  held>IV heparin   LBBB - chronic LBBB - may need to consider CRT-D  HLD Statin intolerance - continue zetia  - LDL 112, goal<55 - will need repatha  as OP    For questions or updates, please contact [facility] Please consult [email or web address] for contact info under       Signed, Leshawn Houseworth VEAR Fishman, PA-C

## 2024-06-22 NOTE — Progress Notes (Signed)
 CRITICAL VALUE STICKER  CRITICAL VALUE: APTT 173   RECEIVER (on-site recipient of call): Marolyn Lanie PEAK  DATE & TIME NOTIFIED:  9/5//2025 2130  MESSENGER (representative from lab): Cristy   MD NOTIFIED: Erminio [facility] NP / Damien Napoleon [facility]  TIME OF NOTIFICATION: 2132  RESPONSE:  Per Pharmacist - recommend continuing heparin  infusion until first scheduled aPTT check tomorrow at 0400, monitor for bleeding.

## 2024-06-22 NOTE — Consult Note (Signed)
 Pharmacy Consult Note - Anticoagulation  Pharmacy Consult for Heparin  Indication: atrial fibrillation Allergies  Allergen Reactions   Lipitor [Atorvastatin] Other (See Comments)    Myalgias    Pravachol [Pravastatin] Other (See Comments)    Myalgias    Statins Other (See Comments)    Myalgias    Zocor [Simvastatin] Other (See Comments)    Myalgias    Penicillins Rash    Other Reaction(s): HIVES, Unknown    PATIENT MEASUREMENTS: Height: 5' 7.5 (171.5 cm) Weight: 85.4 kg (188 lb 4.4 oz) IBW/kg (Calculated) : 67.25 HEPARIN  DW (KG): 85.2  VITAL SIGNS: Temp: 98.3 F (36.8 C) (09/06 0507) Temp Source: Oral (09/05 1940) BP: 130/62 (09/06 0507) Pulse Rate: 63 (09/06 0507)  Recent Labs    06/21/24 0822 06/21/24 1303 06/22/24 0500  HGB  --    < > 11.9*  HCT  --    < > 36.8*  PLT  --    < > 191  APTT  --    < > 180*  LABPROT 14.1  --   --   INR 1.0  --   --   HEPARINUNFRC  --    < > 0.64  CREATININE  --    < > 2.04*   < > = values in this interval not displayed.    Estimated Creatinine Clearance: 30.9 mL/min (A) (by C-G formula based on SCr of 2.04 mg/dL (H)).  PAST MEDICAL HISTORY: Past Medical History:  Diagnosis Date   Arthritis    Cancer (HCC)    prostate cancer    Cardiomyopathy (HCC)    Chronic kidney disease, stage 3 (HCC)    Coronary artery disease    ED (erectile dysfunction)    GERD (gastroesophageal reflux disease)    Headache    hx of migraine-2015    Hyperlipidemia    Hypertension    Hypothyroidism    LBBB (left bundle branch block)    Migraine    Nocturia    Overactive bladder    Pneumonia    hx of at age 61     Medications:  Medications Prior to Admission  Medication Sig Dispense Refill Last Dose/Taking   amLODipine  (NORVASC ) 5 MG tablet Take 1 tablet (5 mg total) by mouth daily. (Patient taking differently: Take 5 mg by mouth every evening.) 90 tablet 3 06/20/2024 Evening   apixaban  (ELIQUIS ) 5 MG TABS tablet Take 1 tablet (5 mg total)  by mouth 2 (two) times daily. 180 tablet 3 06/18/2024 Evening   aspirin  EC 81 MG tablet Take 1 tablet (81 mg total) by mouth daily. Swallow whole.   06/21/2024 at  6:15 AM   cholecalciferol  (VITAMIN D3) 25 MCG (1000 UNIT) tablet Take 1 tablet (1,000 Units total) by mouth daily. (Patient taking differently: Take 1,000 Units by mouth every evening.) 30 tablet 0 06/20/2024 Evening   ezetimibe  (ZETIA ) 10 MG tablet Take 1 tablet (10 mg total) by mouth daily. 90 tablet 3 06/21/2024 at  6:15 AM   famotidine  (PEPCID ) 20 MG tablet Take 1 tablet (20 mg total) by mouth every evening. (Patient taking differently: Take 20 mg by mouth every 3 (three) days. Taking in the evening) 30 tablet 0 06/20/2024 Evening   furosemide  (LASIX ) 40 MG tablet Take 1 tablet (40 mg total) by mouth daily as needed (for increased shortness of breath, lower extremity swelling, or weight gain greater than 3 pounds overnight or 5 pounds in a 7-day time span). 30 tablet 6 06/18/2024   levothyroxine  (SYNTHROID )  75 MCG tablet Take 75 mcg by mouth every morning.   06/21/2024 Morning   MAGNESIUM  PO Take 1 tablet by mouth at bedtime.   06/20/2024 Bedtime   metoprolol  succinate (TOPROL -XL) 25 MG 24 hr tablet Take 1 tablet (25 mg total) by mouth daily. Take with or immediately following a meal. 90 tablet 3 06/21/2024 at  6:15 AM   Multiple Vitamins-Minerals (PRESERVISION AREDS 2) CAPS Take 1 capsule by mouth in the morning and at bedtime.   06/20/2024 Evening   Omega-3 Fatty Acids (FISH OIL) 1000 MG CAPS Take 1,000 mg by mouth daily.   06/20/2024 Morning   Bempedoic Acid  (NEXLETOL ) 180 MG TABS Take 1 tablet (180 mg total) by mouth daily. (Patient not taking: No sig reported) 90 tablet 3 Not Taking   nitroGLYCERIN  (NITROSTAT ) 0.4 MG SL tablet Place 1 tablet (0.4 mg total) under the tongue every 5 (five) minutes as needed for chest pain. (Patient not taking: Reported on 06/21/2024) 25 tablet 0 Not Taking   Scheduled:   amLODipine   5 mg Oral QHS   aspirin  EC  81 mg Oral  Daily   clopidogrel   75 mg Oral Q breakfast   ezetimibe   10 mg Oral Daily   levothyroxine   75 mcg Oral Q0600   metoprolol  succinate  25 mg Oral Daily   sodium chloride  flush  3 mL Intravenous Q12H   sodium chloride  flush  3 mL Intravenous Q12H   Infusions:   sodium chloride      sodium chloride      heparin  1,200 Units/hr (06/21/24 1842)   PRN: sodium chloride , sodium chloride , acetaminophen , nitroGLYCERIN , ondansetron  (ZOFRAN ) IV, sodium chloride  flush, sodium chloride  flush  ASSESSMENT: 78 y.o. 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 Patient is Abixaban per chart review. Pharmacy has been consulted to initiate and manage heparin  intravenous infusion. Patient received x5 doses of heparin  1,000 units during procedure.    Goal(s) of therapy: Heparin  level 0.3 - 0.7 units/mL aPTT 66 - 102 seconds Monitor platelets by anticoagulation protocol: Yes   Baseline anticoagulation labs: Recent Labs    06/21/24 0822 06/21/24 1303 06/21/24 1307 06/21/24 1442 06/21/24 1858 06/22/24 0500  APTT  --   --   --   --  173* 180*  INR 1.0  --   --   --   --   --   HGB  --    < > 11.9* 12.1*  --  11.9*  PLT  --   --   --  214  --  191   < > = values in this interval not displayed.    09/06 0500 aPTT 180 supratherapeutic / HL 0.64 therapeutic, not correlating  PLAN: Based on aPTT, spoke with RN, hold infusion for 1 hour Restart heparin  infusion at 1000 units/hour. Recheck aPTT in 8 hours after restart Continue to titrate by aPTT until heparin  level and aPTT correlate, then titrate by heparin  level alone. Check heparin  level with next AM labs. Continue to monitor CBC daily while on heparin  infusion.  Rankin CANDIE Dills, PharmD, MBA 06/22/2024 6:30 AM

## 2024-06-22 NOTE — Progress Notes (Signed)
 CRITICAL VALUE STICKER  CRITICAL VALUE:aptt 180  RECEIVER (on-site recipient of call):Takoda Janowiak  DATE & TIME NOTIFIED: 06/22/2024 9378  MESSENGER (representative from lab): savannah  MD NOTIFIED:  Erminio [facility] NP / Rankin Dills [facility]  TIME OF NOTIFICATION: 0624  RESPONSE:  Per Pharmacist- Hold heparin  1 hr then restart at 7:30 am

## 2024-06-23 DIAGNOSIS — I5020 Unspecified systolic (congestive) heart failure: Secondary | ICD-10-CM | POA: Diagnosis not present

## 2024-06-23 DIAGNOSIS — I2510 Atherosclerotic heart disease of native coronary artery without angina pectoris: Secondary | ICD-10-CM | POA: Diagnosis not present

## 2024-06-23 DIAGNOSIS — I420 Dilated cardiomyopathy: Secondary | ICD-10-CM | POA: Diagnosis not present

## 2024-06-23 DIAGNOSIS — E663 Overweight: Secondary | ICD-10-CM | POA: Insufficient documentation

## 2024-06-23 DIAGNOSIS — I10 Essential (primary) hypertension: Secondary | ICD-10-CM | POA: Diagnosis not present

## 2024-06-23 DIAGNOSIS — I255 Ischemic cardiomyopathy: Secondary | ICD-10-CM | POA: Diagnosis not present

## 2024-06-23 LAB — CBC
HCT: 35.8 % — ABNORMAL LOW (ref 39.0–52.0)
Hemoglobin: 11.4 g/dL — ABNORMAL LOW (ref 13.0–17.0)
MCH: 27.6 pg (ref 26.0–34.0)
MCHC: 31.8 g/dL (ref 30.0–36.0)
MCV: 86.7 fL (ref 80.0–100.0)
Platelets: 196 K/uL (ref 150–400)
RBC: 4.13 MIL/uL — ABNORMAL LOW (ref 4.22–5.81)
RDW: 14.8 % (ref 11.5–15.5)
WBC: 7.4 K/uL (ref 4.0–10.5)
nRBC: 0 % (ref 0.0–0.2)

## 2024-06-23 LAB — BASIC METABOLIC PANEL WITH GFR
Anion gap: 7 (ref 5–15)
BUN: 34 mg/dL — ABNORMAL HIGH (ref 8–23)
CO2: 25 mmol/L (ref 22–32)
Calcium: 8.3 mg/dL — ABNORMAL LOW (ref 8.9–10.3)
Chloride: 106 mmol/L (ref 98–111)
Creatinine, Ser: 1.84 mg/dL — ABNORMAL HIGH (ref 0.61–1.24)
GFR, Estimated: 37 mL/min — ABNORMAL LOW (ref >60)
Glucose, Bld: 91 mg/dL (ref 70–99)
Potassium: 4 mmol/L (ref 3.5–5.1)
Sodium: 138 mmol/L (ref 135–145)

## 2024-06-23 LAB — HEPARIN LEVEL (UNFRACTIONATED): Heparin Unfractionated: 0.36 [IU]/mL (ref 0.30–0.70)

## 2024-06-23 LAB — LIPOPROTEIN A (LPA): Lipoprotein (a): 77.1 nmol/L — ABNORMAL HIGH (ref <75.0)

## 2024-06-23 LAB — MAGNESIUM: Magnesium: 2.2 mg/dL (ref 1.7–2.4)

## 2024-06-23 LAB — APTT: aPTT: 100 s — ABNORMAL HIGH (ref 24–36)

## 2024-06-23 MED ORDER — DAPAGLIFLOZIN PROPANEDIOL 10 MG PO TABS: 10.0000 mg | ORAL_TABLET | Freq: Every day | ORAL | Status: DC

## 2024-06-23 MED ORDER — AMLODIPINE BESYLATE 5 MG PO TABS
5.0000 mg | ORAL_TABLET | Freq: Every day | ORAL | Status: DC
  Administered 2024-06-23: 5 mg via ORAL
  Filled 2024-06-23: qty 1

## 2024-06-23 NOTE — Consult Note (Signed)
 Pharmacy Consult Note - Anticoagulation  Pharmacy Consult for Heparin  Indication: atrial fibrillation Allergies  Allergen Reactions   Lipitor [Atorvastatin] Other (See Comments)    Myalgias    Pravachol [Pravastatin] Other (See Comments)    Myalgias    Statins Other (See Comments)    Myalgias    Zocor [Simvastatin] Other (See Comments)    Myalgias    Penicillins Rash    Other Reaction(s): HIVES, Unknown    PATIENT MEASUREMENTS: Height: 5' 7.5 (171.5 cm) Weight: 83.8 kg (184 lb 11.9 oz) IBW/kg (Calculated) : 67.25 HEPARIN  DW (KG): 85.2  VITAL SIGNS: Temp: 98.3 F (36.8 C) (09/07 0417) BP: 139/69 (09/07 0417) Pulse Rate: 67 (09/07 0417)  Recent Labs    06/21/24 0822 06/21/24 1303 06/22/24 1109 06/22/24 1523 06/23/24 0523  HGB  --    < > 12.8*  --  11.4*  HCT  --    < > 39.0  --  35.8*  PLT  --    < > 208  --  196  APTT  --    < >  --    < > 100*  LABPROT 14.1  --   --   --   --   INR 1.0  --   --   --   --   HEPARINUNFRC  --    < >  --   --  0.36  CREATININE  --    < > 2.00*  --   --    < > = values in this interval not displayed.    Estimated Creatinine Clearance: 31.3 mL/min (A) (by C-G formula based on SCr of 2 mg/dL (H)).  PAST MEDICAL HISTORY: Past Medical History:  Diagnosis Date   Arthritis    Cancer (HCC)    prostate cancer    Cardiomyopathy (HCC)    Chronic kidney disease, stage 3 (HCC)    Coronary artery disease    ED (erectile dysfunction)    GERD (gastroesophageal reflux disease)    Headache    hx of migraine-2015    Hyperlipidemia    Hypertension    Hypothyroidism    LBBB (left bundle branch block)    Migraine    Nocturia    Overactive bladder    Pneumonia    hx of at age 31     Medications:  Medications Prior to Admission  Medication Sig Dispense Refill Last Dose/Taking   amLODipine  (NORVASC ) 5 MG tablet Take 1 tablet (5 mg total) by mouth daily. (Patient taking differently: Take 5 mg by mouth every evening.) 90 tablet 3  06/20/2024 Evening   apixaban  (ELIQUIS ) 5 MG TABS tablet Take 1 tablet (5 mg total) by mouth 2 (two) times daily. 180 tablet 3 06/18/2024 Evening   aspirin  EC 81 MG tablet Take 1 tablet (81 mg total) by mouth daily. Swallow whole.   06/21/2024 at  6:15 AM   cholecalciferol  (VITAMIN D3) 25 MCG (1000 UNIT) tablet Take 1 tablet (1,000 Units total) by mouth daily. (Patient taking differently: Take 1,000 Units by mouth every evening.) 30 tablet 0 06/20/2024 Evening   ezetimibe  (ZETIA ) 10 MG tablet Take 1 tablet (10 mg total) by mouth daily. 90 tablet 3 06/21/2024 at  6:15 AM   famotidine  (PEPCID ) 20 MG tablet Take 1 tablet (20 mg total) by mouth every evening. (Patient taking differently: Take 20 mg by mouth every 3 (three) days. Taking in the evening) 30 tablet 0 06/20/2024 Evening   furosemide  (LASIX ) 40 MG tablet Take  1 tablet (40 mg total) by mouth daily as needed (for increased shortness of breath, lower extremity swelling, or weight gain greater than 3 pounds overnight or 5 pounds in a 7-day time span). 30 tablet 6 06/18/2024   levothyroxine  (SYNTHROID ) 75 MCG tablet Take 75 mcg by mouth every morning.   06/21/2024 Morning   MAGNESIUM  PO Take 1 tablet by mouth at bedtime.   06/20/2024 Bedtime   metoprolol  succinate (TOPROL -XL) 25 MG 24 hr tablet Take 1 tablet (25 mg total) by mouth daily. Take with or immediately following a meal. 90 tablet 3 06/21/2024 at  6:15 AM   Multiple Vitamins-Minerals (PRESERVISION AREDS 2) CAPS Take 1 capsule by mouth in the morning and at bedtime.   06/20/2024 Evening   Omega-3 Fatty Acids (FISH OIL) 1000 MG CAPS Take 1,000 mg by mouth daily.   06/20/2024 Morning   Bempedoic Acid  (NEXLETOL ) 180 MG TABS Take 1 tablet (180 mg total) by mouth daily. (Patient not taking: No sig reported) 90 tablet 3 Not Taking   nitroGLYCERIN  (NITROSTAT ) 0.4 MG SL tablet Place 1 tablet (0.4 mg total) under the tongue every 5 (five) minutes as needed for chest pain. (Patient not taking: Reported on 06/21/2024) 25 tablet 0  Not Taking   Scheduled:   amLODipine   5 mg Oral QHS   aspirin  EC  81 mg Oral Daily   clopidogrel   75 mg Oral Q breakfast   ezetimibe   10 mg Oral Daily   levothyroxine   75 mcg Oral Q0600   metoprolol  succinate  25 mg Oral Daily   sodium chloride  flush  3 mL Intravenous Q12H   sodium chloride  flush  3 mL Intravenous Q12H   Infusions:   heparin  1,000 Units/hr (06/22/24 1927)   PRN: acetaminophen , famotidine , nitroGLYCERIN , ondansetron  (ZOFRAN ) IV, sodium chloride  flush, sodium chloride  flush  ASSESSMENT: 78 y.o. 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 Patient is Abixaban per chart review. Pharmacy has been consulted to initiate and manage heparin  intravenous infusion. Patient received x5 doses of heparin  1,000 units during procedure.    Goal(s) of therapy: Heparin  level 0.3 - 0.7 units/mL aPTT 66 - 102 seconds Monitor platelets by anticoagulation protocol: Yes   Baseline anticoagulation labs: Recent Labs    06/21/24 0822 06/21/24 1303 06/22/24 0500 06/22/24 1109 06/22/24 1523 06/22/24 2252 06/23/24 0523  APTT  --    < > 180*  --  84* 94* 100*  INR 1.0  --   --   --   --   --   --   HGB  --    < > 11.9* 12.8*  --   --  11.4*  PLT  --    < > 191 208  --   --  196   < > = values in this interval not displayed.    09/06 0500 aPTT 180 supratherapeutic / HL 0.64 therapeutic, not correlating 09/06 1523 aPTT 84, therapeutic x 1 09/06 2252 aPTT 94, therapeutic x 2 09/07 0523 aPTT 100, therapeutic x 3  PLAN: Continue heparin  infusion at 1000 units/hour. Recheck aPTT daily w/ AM labs while therapeutic Continue to titrate by aPTT until heparin  level and aPTT correlate, then titrate by heparin  level alone. Check heparin  level with next AM labs. Continue to monitor CBC daily while on heparin   infusion.  Thank you for  involving pharmacy in this patient's care.   Rankin CANDIE Dills, PharmD, [facility] 06/23/2024 6:23 AM

## 2024-06-23 NOTE — H&P (View-Only) (Signed)
 Progress Note  Patient Name: Tyler David Date of Encounter: 06/23/2024 [facility] Cardiologist: Lonni Hanson, MD   Interval Summary    Kidney function stabilized. Patient denies chest pain. Has persistent SOB. He has not been up much. Plan for [facility] tomorrow.  Vital Signs Vitals:   06/22/24 1944 06/22/24 2352 06/23/24 0417 06/23/24 0500  BP: (!) 143/69 128/63 139/69   Pulse: 64 68 67   Resp: 20 20 19    Temp: 98.3 F (36.8 C) 98.1 F (36.7 C) 98.3 F (36.8 C)   TempSrc:      SpO2: 98% 97% 98%   Weight:    83.8 kg  Height:        Intake/Output Summary (Last 24 hours) at 06/23/2024 0717 Last data filed at 06/23/2024 0500 Gross per 24 hour  Intake 913.72 ml  Output 1200 ml  Net -286.28 ml      06/23/2024    5:00 AM 06/22/2024    5:00 AM 06/21/2024    8:07 AM  Last 3 Weights  Weight (lbs) 184 lb 11.9 oz 188 lb 4.4 oz 193 lb 14.4 oz  Weight (kg) 83.8 kg 85.4 kg 87.952 kg      Telemetry/ECG  NSR HR 60s - Personally Reviewed  Physical Exam  GEN: No acute distress.   Neck: No JVD Cardiac: RRR, no murmurs, rubs, or gallops.  Respiratory: Clear to auscultation bilaterally. GI: Soft, nontender, non-distended  MS: No edema  Relevant CV studies: R/L heart cath Conclusions: Severe native coronary artery disease, as detailed below, including 70% mid LAD stenosis, 90% proximal/mid LCx lesion, 95% mid RCA stenosis, and chronic total occlusion of RPDA V with left-to-right and right to right collaterals. Widely patent LIMA-LAD. Occluded SVG-OM1 and SVG-RPDA. Moderately to severely elevated left heart, right heart, and pulmonary artery pressures. Mildly reduced Fick cardiac output/index. Successful PCI to mid RCA using Onyx frontier 3.5 x 18 mm drug-eluting stent with 0% residual stenosis and TIMI-3 flow.   Recommendations: Admit for post PCI care as well as diuresis and optimization of goal-directed medical therapy for acute on chronic HFrEF complicated by  CKD. Anticipate staged PCI to proximal/mid LCx prior to discharge once volume status and renal function have been optimized. Initiate heparin  infusion 2 hours after TR band has been removed. Continue aspirin  and clopidogrel  while in the hospital.  Will transition to clopidogrel  and apixaban  prior to discharge. Aggressive secondary prevention.  Continue ezetimibe  as inpatient, with plans to transition to PCSK9 inhibitor after discharge given that bempedoic acid /ezetimibe  was not approved by his insurance.   Lonni Hanson, MD [facility]   PET stress test 05/2024   LV perfusion is abnormal. There is evidence of ischemia. There is no evidence of infarction. Defect 1: There is a large defect with severe reduction in uptake present in the apical to basal inferior and inferolateral location(s) that is reversible. There is abnormal wall motion in the defect area. Consistent with ischemia.   Rest left ventricular function is abnormal. Rest global function is moderately reduced. There was a single regional abnormality. Rest EF: 31%. Stress left ventricular function is abnormal. Stress global function is moderately reduced. There was a single regional abnormality. Stress EF: 43%. End diastolic cavity size is moderately enlarged. End systolic cavity size is moderately enlarged.   Myocardial blood flow was computed to be 0.50ml/g/min at rest and 0.40ml/g/min at stress. Global myocardial blood flow reserve was 1.21 and was highly abnormal.   Coronary calcium  assessment not performed due to  prior revascularization. Prior CABG   Findings are consistent with ischemia. The study is high risk.   Highly abnormal MBFR but in setting of prior CABG MVFR in ischemic area only 0.77     Echo 04/2024    1. Left ventricular ejection fraction, by estimation, is 25 to 30%. The  left ventricle has severely decreased function. The left ventricle  demonstrates global hypokinesis. The left ventricular internal cavity size   was mildly dilated. Left ventricular  diastolic parameters are consistent with Grade II diastolic dysfunction  (pseudonormalization).   2. Right ventricular systolic function is normal. The right ventricular  size is normal. Tricuspid regurgitation signal is inadequate for assessing  PA pressure.   3. Left atrial size was moderately dilated.   4. The mitral valve is normal in structure. Mild mitral valve  regurgitation. No evidence of mitral stenosis.   5. Tricuspid valve regurgitation is mild to moderate.   6. The aortic valve is tricuspid. There is mild calcification of the  aortic valve. Aortic valve regurgitation is not visualized. Aortic valve  sclerosis/calcification is present, without any evidence of aortic  stenosis. Aortic valve mean gradient  measures 4.0 mmHg.   Comparison(s): EF has decreased from prior study. Distal anteroseptal wall  is dyskintic.   Assessment & Plan   CAD s/p CABG 12/2023 Abnormal stress test S/p PCI mRCA - recent PET showed large reversible perfusion defect. Given persistently reduced EF and abnormal stress test, cardiac cath indicated.  - R/L heart cath showed severe native CAD, including 70% mLAD stenosis, 90% p/m Lcx lesion, 95% mRCA stenosis, and CTO RPDA V with L>R collaterals, patent LIMA-LAD, occluded SVG-OM1 and SVG to RPDA. Moderately elevated left and right heart pressures. Mildly reduced CO/CI - patient was treated with PCI mRCA and admitted post-cath. Plan staged PCI to p/m Lcx prior to discharge pending renal function - IV heparin  - continue ASA 81mg  daily and PLAvix  75mg  daily. Anticipate Eliquis  and Plavix  at d/c - metoprolol  25mg  daily - kidney function looks stable - plan for repeat heart cath Monday. Orders placed.   Risks and benefits of cardiac catheterization have been discussed with the patient.  These include bleeding, infection, kidney damage, stroke, heart attack, death.  The patient understands these risks and is willing to  proceed.    ICM  HFrEF LVEF 25-30% - he was given IV lasix  40mg  9/5. patient appears euvolemic - hold amlodipine  for low EF - can add SGLT2i prior to d/c - toprol  25mg  daily  Post-op Afib - CHADSVASC at least 5 - in NSR - Eliquis  held>IV heparin    LBBB - chronic LBBB - may need to consider CRT-D   HLD Statin intolerance - continue zetia  - LDL 112, goal<55 - will need repatha  as OP   For questions or updates, please contact [facility] Please consult [email or web address] for contact info under       Signed, Jessie Schrieber VEAR Fishman, PA-C

## 2024-06-23 NOTE — Progress Notes (Signed)
  Progress Note   Patient: Tyler David FMW:[id number] DOB: Jan 06, 1946 DOA: 06/21/2024     2 DOS: the patient was seen and examined on 06/23/2024   Brief hospital course: KIRAN CARLINE is a 78 y.o. 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  She was given IV Lasix  for CHF exacerbation. Heart catheter was performed on 9/5, had a drug-eluting stent placed to mid RCA.  Planning for staged PCI to circumflex on Monday or Tuesday.  The plan is continue aspirin  and Plavix  while in the hospital, transition to Plavix  and apixaban  prior to discharge.  Patient currently is also on heparin  drip.   Principal Problem:   HFrEF (heart failure with reduced ejection fraction) (HCC) Active Problems:   Chronic HFrEF (heart failure with reduced ejection fraction) (HCC)   Abnormal stress test   CHF (congestive heart failure) (HCC)   Coronary artery disease involving native coronary artery of native heart without angina pectoris   Ischemic congestive cardiomyopathy (HCC)   Assessment and Plan: Acute on chronic systolic congestive heart failure. Ischemic cardiomyopathy. Patient received IV Lasix , volume status has been better. Condition still stable.   Multivessel coronary disease status post CABG. Stable angina with failed bypass graft in RCA and left circumflex. Patient is status post PCI on RCA, currently still on aspirin , Plavix  and heparin  drip per cardiology. Planning repeat PCI on Monday or Tuesday. No change in treatment today.   Essential hypertension. On amlodipine .   Chronic kidney disease stage IIIb. Function improving.        Subjective:  Patient doing well, no chest pain or shortness of breath  Physical Exam: Vitals:   06/23/24 0417 06/23/24 0500 06/23/24 0825 06/23/24 1125   BP: 139/69  (!) 146/62 (!) 122/55  Pulse: 67  66 61  Resp: 19  18 18   Temp: 98.3 F (36.8 C)  98.2 F (36.8 C) 97.9 F (36.6 C)  TempSrc:    Oral  SpO2: 98%  95% 96%  Weight:  83.8 kg    Height:       General exam: Appears calm and comfortable  Respiratory system: Clear to auscultation. Respiratory effort normal. Cardiovascular system: S1 & S2 heard, RRR. No JVD, murmurs, rubs, gallops or clicks. No pedal edema. Gastrointestinal system: Abdomen is nondistended, soft and nontender. No organomegaly or masses felt. Normal bowel sounds heard. Central nervous system: Alert and oriented. No focal neurological deficits. Extremities: Symmetric 5 x 5 power. Skin: No rashes, lesions or ulcers Psychiatry: Judgement and insight appear normal. Mood & affect appropriate.    Data Reviewed:  Lab results reviewed.  Family Communication: Wife updated at bedside  Disposition: Status is: Inpatient Remains inpatient appropriate because: Severity of disease, inpatient procedure     Time spent: 35 minutes  Author: Murvin Mana, MD 06/23/2024 2:35 PM  For on call review [email or web address].

## 2024-06-23 NOTE — Plan of Care (Signed)

## 2024-06-23 NOTE — Progress Notes (Signed)
 Progress Note  Patient Name: Tyler David Date of Encounter: 06/23/2024 [facility] Cardiologist: Lonni Hanson, MD   Interval Summary    Kidney function stabilized. Patient denies chest pain. Has persistent SOB. He has not been up much. Plan for [facility] tomorrow.  Vital Signs Vitals:   06/22/24 1944 06/22/24 2352 06/23/24 0417 06/23/24 0500  BP: (!) 143/69 128/63 139/69   Pulse: 64 68 67   Resp: 20 20 19    Temp: 98.3 F (36.8 C) 98.1 F (36.7 C) 98.3 F (36.8 C)   TempSrc:      SpO2: 98% 97% 98%   Weight:    83.8 kg  Height:        Intake/Output Summary (Last 24 hours) at 06/23/2024 0717 Last data filed at 06/23/2024 0500 Gross per 24 hour  Intake 913.72 ml  Output 1200 ml  Net -286.28 ml      06/23/2024    5:00 AM 06/22/2024    5:00 AM 06/21/2024    8:07 AM  Last 3 Weights  Weight (lbs) 184 lb 11.9 oz 188 lb 4.4 oz 193 lb 14.4 oz  Weight (kg) 83.8 kg 85.4 kg 87.952 kg      Telemetry/ECG  NSR HR 60s - Personally Reviewed  Physical Exam  GEN: No acute distress.   Neck: No JVD Cardiac: RRR, no murmurs, rubs, or gallops.  Respiratory: Clear to auscultation bilaterally. GI: Soft, nontender, non-distended  MS: No edema  Relevant CV studies: R/L heart cath Conclusions: Severe native coronary artery disease, as detailed below, including 70% mid LAD stenosis, 90% proximal/mid LCx lesion, 95% mid RCA stenosis, and chronic total occlusion of RPDA V with left-to-right and right to right collaterals. Widely patent LIMA-LAD. Occluded SVG-OM1 and SVG-RPDA. Moderately to severely elevated left heart, right heart, and pulmonary artery pressures. Mildly reduced Fick cardiac output/index. Successful PCI to mid RCA using Onyx frontier 3.5 x 18 mm drug-eluting stent with 0% residual stenosis and TIMI-3 flow.   Recommendations: Admit for post PCI care as well as diuresis and optimization of goal-directed medical therapy for acute on chronic HFrEF complicated by  CKD. Anticipate staged PCI to proximal/mid LCx prior to discharge once volume status and renal function have been optimized. Initiate heparin  infusion 2 hours after TR band has been removed. Continue aspirin  and clopidogrel  while in the hospital.  Will transition to clopidogrel  and apixaban  prior to discharge. Aggressive secondary prevention.  Continue ezetimibe  as inpatient, with plans to transition to PCSK9 inhibitor after discharge given that bempedoic acid /ezetimibe  was not approved by his insurance.   Lonni Hanson, MD [facility]   PET stress test 05/2024   LV perfusion is abnormal. There is evidence of ischemia. There is no evidence of infarction. Defect 1: There is a large defect with severe reduction in uptake present in the apical to basal inferior and inferolateral location(s) that is reversible. There is abnormal wall motion in the defect area. Consistent with ischemia.   Rest left ventricular function is abnormal. Rest global function is moderately reduced. There was a single regional abnormality. Rest EF: 31%. Stress left ventricular function is abnormal. Stress global function is moderately reduced. There was a single regional abnormality. Stress EF: 43%. End diastolic cavity size is moderately enlarged. End systolic cavity size is moderately enlarged.   Myocardial blood flow was computed to be 0.50ml/g/min at rest and 0.40ml/g/min at stress. Global myocardial blood flow reserve was 1.21 and was highly abnormal.   Coronary calcium  assessment not performed due to  prior revascularization. Prior CABG   Findings are consistent with ischemia. The study is high risk.   Highly abnormal MBFR but in setting of prior CABG MVFR in ischemic area only 0.77     Echo 04/2024    1. Left ventricular ejection fraction, by estimation, is 25 to 30%. The  left ventricle has severely decreased function. The left ventricle  demonstrates global hypokinesis. The left ventricular internal cavity size   was mildly dilated. Left ventricular  diastolic parameters are consistent with Grade II diastolic dysfunction  (pseudonormalization).   2. Right ventricular systolic function is normal. The right ventricular  size is normal. Tricuspid regurgitation signal is inadequate for assessing  PA pressure.   3. Left atrial size was moderately dilated.   4. The mitral valve is normal in structure. Mild mitral valve  regurgitation. No evidence of mitral stenosis.   5. Tricuspid valve regurgitation is mild to moderate.   6. The aortic valve is tricuspid. There is mild calcification of the  aortic valve. Aortic valve regurgitation is not visualized. Aortic valve  sclerosis/calcification is present, without any evidence of aortic  stenosis. Aortic valve mean gradient  measures 4.0 mmHg.   Comparison(s): EF has decreased from prior study. Distal anteroseptal wall  is dyskintic.   Assessment & Plan   CAD s/p CABG 12/2023 Abnormal stress test S/p PCI mRCA - recent PET showed large reversible perfusion defect. Given persistently reduced EF and abnormal stress test, cardiac cath indicated.  - R/L heart cath showed severe native CAD, including 70% mLAD stenosis, 90% p/m Lcx lesion, 95% mRCA stenosis, and CTO RPDA V with L>R collaterals, patent LIMA-LAD, occluded SVG-OM1 and SVG to RPDA. Moderately elevated left and right heart pressures. Mildly reduced CO/CI - patient was treated with PCI mRCA and admitted post-cath. Plan staged PCI to p/m Lcx prior to discharge pending renal function - IV heparin  - continue ASA 81mg  daily and PLAvix  75mg  daily. Anticipate Eliquis  and Plavix  at d/c - metoprolol  25mg  daily - kidney function looks stable - plan for repeat heart cath Monday. Orders placed.   Risks and benefits of cardiac catheterization have been discussed with the patient.  These include bleeding, infection, kidney damage, stroke, heart attack, death.  The patient understands these risks and is willing to  proceed.    ICM  HFrEF LVEF 25-30% - he was given IV lasix  40mg  9/5. patient appears euvolemic - hold amlodipine  for low EF - can add SGLT2i prior to d/c - toprol  25mg  daily  Post-op Afib - CHADSVASC at least 5 - in NSR - Eliquis  held>IV heparin    LBBB - chronic LBBB - may need to consider CRT-D   HLD Statin intolerance - continue zetia  - LDL 112, goal<55 - will need repatha  as OP   For questions or updates, please contact [facility] Please consult [email or web address] for contact info under       Signed, Jessie Schrieber VEAR Fishman, PA-C

## 2024-06-23 NOTE — Consult Note (Signed)
 Pharmacy Consult Note - Anticoagulation  Pharmacy Consult for Heparin  Indication: atrial fibrillation Allergies  Allergen Reactions   Lipitor [Atorvastatin] Other (See Comments)    Myalgias    Pravachol [Pravastatin] Other (See Comments)    Myalgias    Statins Other (See Comments)    Myalgias    Zocor [Simvastatin] Other (See Comments)    Myalgias    Penicillins Rash    Other Reaction(s): HIVES, Unknown    PATIENT MEASUREMENTS: Height: 5' 7.5 (171.5 cm) Weight: 85.4 kg (188 lb 4.4 oz) IBW/kg (Calculated) : 67.25 HEPARIN  DW (KG): 85.2  VITAL SIGNS: Temp: 98.1 F (36.7 C) (09/06 2352) BP: 128/63 (09/06 2352) Pulse Rate: 68 (09/06 2352)  Recent Labs    06/21/24 0822 06/21/24 1303 06/22/24 0500 06/22/24 1109 06/22/24 1523 06/22/24 2252  HGB  --    < > 11.9* 12.8*  --   --   HCT  --    < > 36.8* 39.0  --   --   PLT  --    < > 191 208  --   --   APTT  --    < > 180*  --    < > 94*  LABPROT 14.1  --   --   --   --   --   INR 1.0  --   --   --   --   --   HEPARINUNFRC  --    < > 0.64  --   --   --   CREATININE  --    < > 2.04* 2.00*  --   --    < > = values in this interval not displayed.    Estimated Creatinine Clearance: 31.6 mL/min (A) (by C-G formula based on SCr of 2 mg/dL (H)).  PAST MEDICAL HISTORY: Past Medical History:  Diagnosis Date   Arthritis    Cancer (HCC)    prostate cancer    Cardiomyopathy (HCC)    Chronic kidney disease, stage 3 (HCC)    Coronary artery disease    ED (erectile dysfunction)    GERD (gastroesophageal reflux disease)    Headache    hx of migraine-2015    Hyperlipidemia    Hypertension    Hypothyroidism    LBBB (left bundle branch block)    Migraine    Nocturia    Overactive bladder    Pneumonia    hx of at age 36     Medications:  Medications Prior to Admission  Medication Sig Dispense Refill Last Dose/Taking   amLODipine  (NORVASC ) 5 MG tablet Take 1 tablet (5 mg total) by mouth daily. (Patient taking differently:  Take 5 mg by mouth every evening.) 90 tablet 3 06/20/2024 Evening   apixaban  (ELIQUIS ) 5 MG TABS tablet Take 1 tablet (5 mg total) by mouth 2 (two) times daily. 180 tablet 3 06/18/2024 Evening   aspirin  EC 81 MG tablet Take 1 tablet (81 mg total) by mouth daily. Swallow whole.   06/21/2024 at  6:15 AM   cholecalciferol  (VITAMIN D3) 25 MCG (1000 UNIT) tablet Take 1 tablet (1,000 Units total) by mouth daily. (Patient taking differently: Take 1,000 Units by mouth every evening.) 30 tablet 0 06/20/2024 Evening   ezetimibe  (ZETIA ) 10 MG tablet Take 1 tablet (10 mg total) by mouth daily. 90 tablet 3 06/21/2024 at  6:15 AM   famotidine  (PEPCID ) 20 MG tablet Take 1 tablet (20 mg total) by mouth every evening. (Patient taking differently: Take 20 mg by  mouth every 3 (three) days. Taking in the evening) 30 tablet 0 06/20/2024 Evening   furosemide  (LASIX ) 40 MG tablet Take 1 tablet (40 mg total) by mouth daily as needed (for increased shortness of breath, lower extremity swelling, or weight gain greater than 3 pounds overnight or 5 pounds in a 7-day time span). 30 tablet 6 06/18/2024   levothyroxine  (SYNTHROID ) 75 MCG tablet Take 75 mcg by mouth every morning.   06/21/2024 Morning   MAGNESIUM  PO Take 1 tablet by mouth at bedtime.   06/20/2024 Bedtime   metoprolol  succinate (TOPROL -XL) 25 MG 24 hr tablet Take 1 tablet (25 mg total) by mouth daily. Take with or immediately following a meal. 90 tablet 3 06/21/2024 at  6:15 AM   Multiple Vitamins-Minerals (PRESERVISION AREDS 2) CAPS Take 1 capsule by mouth in the morning and at bedtime.   06/20/2024 Evening   Omega-3 Fatty Acids (FISH OIL) 1000 MG CAPS Take 1,000 mg by mouth daily.   06/20/2024 Morning   Bempedoic Acid  (NEXLETOL ) 180 MG TABS Take 1 tablet (180 mg total) by mouth daily. (Patient not taking: No sig reported) 90 tablet 3 Not Taking   nitroGLYCERIN  (NITROSTAT ) 0.4 MG SL tablet Place 1 tablet (0.4 mg total) under the tongue every 5 (five) minutes as needed for chest pain.  (Patient not taking: Reported on 06/21/2024) 25 tablet 0 Not Taking   Scheduled:   amLODipine   5 mg Oral QHS   aspirin  EC  81 mg Oral Daily   clopidogrel   75 mg Oral Q breakfast   ezetimibe   10 mg Oral Daily   levothyroxine   75 mcg Oral Q0600   metoprolol  succinate  25 mg Oral Daily   sodium chloride  flush  3 mL Intravenous Q12H   sodium chloride  flush  3 mL Intravenous Q12H   Infusions:   heparin  1,000 Units/hr (06/22/24 1927)   PRN: acetaminophen , famotidine , nitroGLYCERIN , ondansetron  (ZOFRAN ) IV, sodium chloride  flush, sodium chloride  flush  ASSESSMENT: 78 y.o. 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 Patient is Abixaban per chart review. Pharmacy has been consulted to initiate and manage heparin  intravenous infusion. Patient received x5 doses of heparin  1,000 units during procedure.    Goal(s) of therapy: Heparin  level 0.3 - 0.7 units/mL aPTT 66 - 102 seconds Monitor platelets by anticoagulation protocol: Yes   Baseline anticoagulation labs: Recent Labs    06/21/24 0822 06/21/24 1303 06/21/24 1442 06/21/24 1858 06/22/24 0500 06/22/24 1109 06/22/24 1523 06/22/24 2252  APTT  --   --   --    < > 180*  --  84* 94*  INR 1.0  --   --   --   --   --   --   --   HGB  --    < > 12.1*  --  11.9* 12.8*  --   --   PLT  --   --  214  --  191 208  --   --    < > = values in this interval not displayed.    09/06 0500 aPTT 180 supratherapeutic / HL 0.64 therapeutic, not correlating 09/06 1523 aPTT 84, therapeutic x 1 09/06 2252 aPTT 94, therapeutic x 2  PLAN: Continue heparin  infusion at 1000 units/hour. Recheck aPTT daily w/ AM labs while therapeutic Continue to titrate by aPTT until heparin  level  and aPTT correlate, then titrate by heparin  level alone. Check heparin  level with next AM labs. Continue to  monitor CBC daily while on heparin  infusion.  Thank you for involving pharmacy in this patient's care.   Rankin CANDIE Dills, PharmD, [facility] 06/23/2024 12:18 AM

## 2024-06-24 ENCOUNTER — Other Ambulatory Visit (HOSPITAL_COMMUNITY): Payer: Self-pay

## 2024-06-24 ENCOUNTER — Encounter
Admission: RE | Discharge status: Home / Self Care | Payer: Self-pay | Source: Home / Self Care | Attending: Internal Medicine

## 2024-06-24 ENCOUNTER — Other Ambulatory Visit: Payer: Self-pay

## 2024-06-24 ENCOUNTER — Telehealth (HOSPITAL_COMMUNITY): Payer: Self-pay | Admitting: Pharmacy Technician

## 2024-06-24 ENCOUNTER — Encounter: Payer: Self-pay | Admitting: Internal Medicine

## 2024-06-24 DIAGNOSIS — I25110 Atherosclerotic heart disease of native coronary artery with unstable angina pectoris: Secondary | ICD-10-CM

## 2024-06-24 DIAGNOSIS — I2510 Atherosclerotic heart disease of native coronary artery without angina pectoris: Secondary | ICD-10-CM | POA: Diagnosis not present

## 2024-06-24 DIAGNOSIS — E871 Hypo-osmolality and hyponatremia: Secondary | ICD-10-CM | POA: Insufficient documentation

## 2024-06-24 DIAGNOSIS — I255 Ischemic cardiomyopathy: Secondary | ICD-10-CM | POA: Diagnosis not present

## 2024-06-24 DIAGNOSIS — I420 Dilated cardiomyopathy: Secondary | ICD-10-CM | POA: Diagnosis not present

## 2024-06-24 DIAGNOSIS — I5020 Unspecified systolic (congestive) heart failure: Secondary | ICD-10-CM | POA: Diagnosis not present

## 2024-06-24 HISTORY — PX: CORONARY STENT INTERVENTION: CATH118234

## 2024-06-24 LAB — BASIC METABOLIC PANEL WITH GFR
Anion gap: 6 (ref 5–15)
BUN: 33 mg/dL — ABNORMAL HIGH (ref 8–23)
CO2: 23 mmol/L (ref 22–32)
Calcium: 7.6 mg/dL — ABNORMAL LOW (ref 8.9–10.3)
Chloride: 102 mmol/L (ref 98–111)
Creatinine, Ser: 1.88 mg/dL — ABNORMAL HIGH (ref 0.61–1.24)
GFR, Estimated: 36 mL/min — ABNORMAL LOW (ref >60)
Glucose, Bld: 86 mg/dL (ref 70–99)
Potassium: 4.3 mmol/L (ref 3.5–5.1)
Sodium: 131 mmol/L — ABNORMAL LOW (ref 135–145)

## 2024-06-24 LAB — POCT ACTIVATED CLOTTING TIME
Activated Clotting Time: 250 s
Activated Clotting Time: 493 s

## 2024-06-24 LAB — CBC
HCT: 36.5 % — ABNORMAL LOW (ref 39.0–52.0)
Hemoglobin: 11.7 g/dL — ABNORMAL LOW (ref 13.0–17.0)
MCH: 27.9 pg (ref 26.0–34.0)
MCHC: 32.1 g/dL (ref 30.0–36.0)
MCV: 87.1 fL (ref 80.0–100.0)
Platelets: 186 K/uL (ref 150–400)
RBC: 4.19 MIL/uL — ABNORMAL LOW (ref 4.22–5.81)
RDW: 14.6 % (ref 11.5–15.5)
WBC: 7.5 K/uL (ref 4.0–10.5)
nRBC: 0 % (ref 0.0–0.2)

## 2024-06-24 LAB — HEPARIN LEVEL (UNFRACTIONATED): Heparin Unfractionated: 0.26 [IU]/mL — ABNORMAL LOW (ref 0.30–0.70)

## 2024-06-24 LAB — APTT: aPTT: 88 s — ABNORMAL HIGH (ref 24–36)

## 2024-06-24 LAB — GLUCOSE, CAPILLARY: Glucose-Capillary: 85 mg/dL (ref 70–99)

## 2024-06-24 SURGERY — CORONARY STENT INTERVENTION
Anesthesia: Moderate Sedation

## 2024-06-24 MED ORDER — HEPARIN (PORCINE) IN NACL 1000-0.9 UT/500ML-% IV SOLN
INTRAVENOUS | Status: DC | PRN
  Administered 2024-06-24: 1000 mL

## 2024-06-24 MED ORDER — LIDOCAINE HCL 1 % IJ SOLN
INTRAMUSCULAR | Status: AC
  Filled 2024-06-24: qty 20

## 2024-06-24 MED ORDER — SODIUM CHLORIDE 0.9 % WEIGHT BASED INFUSION
1.0000 mL/kg/h | INTRAVENOUS | Status: DC
  Administered 2024-06-24: 1 mL/kg/h via INTRAVENOUS

## 2024-06-24 MED ORDER — IOHEXOL 300 MG/ML  SOLN
INTRAMUSCULAR | Status: DC | PRN
Start: 2024-06-24 — End: 2024-06-24
  Administered 2024-06-24: 50 mL

## 2024-06-24 MED ORDER — ASPIRIN 81 MG PO CHEW
81.0000 mg | CHEWABLE_TABLET | ORAL | Status: AC
  Administered 2024-06-24: 81 mg via ORAL
  Filled 2024-06-24: qty 1

## 2024-06-24 MED ORDER — HEPARIN SODIUM (PORCINE) 1000 UNIT/ML IJ SOLN
INTRAMUSCULAR | Status: DC | PRN
  Administered 2024-06-24: 8000 [IU] via INTRAVENOUS

## 2024-06-24 MED ORDER — ASPIRIN 81 MG PO CHEW
81.0000 mg | CHEWABLE_TABLET | ORAL | Status: DC
Start: 2024-06-25 — End: 2024-06-24

## 2024-06-24 MED ORDER — FENTANYL CITRATE (PF) 100 MCG/2ML IJ SOLN
INTRAMUSCULAR | Status: DC | PRN
  Administered 2024-06-24 (×2): 25 ug via INTRAVENOUS

## 2024-06-24 MED ORDER — FENTANYL CITRATE (PF) 100 MCG/2ML IJ SOLN
INTRAMUSCULAR | Status: AC
  Filled 2024-06-24: qty 2

## 2024-06-24 MED ORDER — MIDAZOLAM HCL 2 MG/2ML IJ SOLN
INTRAMUSCULAR | Status: DC | PRN
  Administered 2024-06-24: 1 mg via INTRAVENOUS

## 2024-06-24 MED ORDER — LIDOCAINE HCL (PF) 1 % IJ SOLN
INTRAMUSCULAR | Status: DC | PRN
  Administered 2024-06-24: 5 mL

## 2024-06-24 MED ORDER — CLOPIDOGREL BISULFATE 75 MG PO TABS
ORAL_TABLET | ORAL | Status: DC | PRN
  Administered 2024-06-24: 300 mg via ORAL

## 2024-06-24 MED ORDER — VERAPAMIL HCL 2.5 MG/ML IV SOLN
INTRAVENOUS | Status: DC | PRN
Start: 2024-06-24 — End: 2024-06-24
  Administered 2024-06-24: 2.5 mg via INTRA_ARTERIAL

## 2024-06-24 MED ORDER — CLOPIDOGREL BISULFATE 75 MG PO TABS
ORAL_TABLET | ORAL | Status: AC
  Filled 2024-06-24: qty 4

## 2024-06-24 MED ORDER — ISOSORBIDE MONONITRATE ER 30 MG PO TB24
30.0000 mg | ORAL_TABLET | Freq: Every day | ORAL | Status: DC
  Administered 2024-06-24 – 2024-06-25 (×2): 30 mg via ORAL
  Filled 2024-06-24 (×2): qty 1

## 2024-06-24 MED ORDER — SODIUM CHLORIDE 0.9 % WEIGHT BASED INFUSION
3.0000 mL/kg/h | INTRAVENOUS | Status: DC
  Administered 2024-06-24: 3 mL/kg/h via INTRAVENOUS

## 2024-06-24 MED ORDER — HYDRALAZINE HCL 25 MG PO TABS
25.0000 mg | ORAL_TABLET | Freq: Three times a day (TID) | ORAL | Status: DC
  Administered 2024-06-24 – 2024-06-25 (×3): 25 mg via ORAL
  Filled 2024-06-24 (×8): qty 1

## 2024-06-24 MED ORDER — HEPARIN (PORCINE) IN NACL 1000-0.9 UT/500ML-% IV SOLN
INTRAVENOUS | Status: AC
  Filled 2024-06-24: qty 1000

## 2024-06-24 MED ORDER — SODIUM CHLORIDE 0.9 % IV SOLN: INTRAVENOUS | Status: AC

## 2024-06-24 MED ORDER — MIDAZOLAM HCL 2 MG/2ML IJ SOLN
INTRAMUSCULAR | Status: AC
  Filled 2024-06-24: qty 2

## 2024-06-24 MED ORDER — SODIUM CHLORIDE 0.9 % WEIGHT BASED INFUSION: 1.0000 mL/kg/h | INTRAVENOUS | Status: DC

## 2024-06-24 MED ORDER — SODIUM CHLORIDE 0.9 % WEIGHT BASED INFUSION: 3.0000 mL/kg/h | INTRAVENOUS | Status: DC

## 2024-06-24 MED ORDER — VERAPAMIL HCL 2.5 MG/ML IV SOLN
INTRAVENOUS | Status: AC
  Filled 2024-06-24: qty 2

## 2024-06-24 MED ORDER — NITROGLYCERIN 1 MG/10 ML FOR IR/CATH LAB
INTRA_ARTERIAL | Status: AC
  Filled 2024-06-24: qty 10

## 2024-06-24 MED ORDER — SODIUM CHLORIDE 0.9% FLUSH: 3.0000 mL | INTRAVENOUS | Status: DC | PRN

## 2024-06-24 MED ORDER — SODIUM CHLORIDE 0.9% FLUSH
3.0000 mL | Freq: Two times a day (BID) | INTRAVENOUS | Status: DC
  Administered 2024-06-25: 10 mL via INTRAVENOUS

## 2024-06-24 MED ORDER — SODIUM CHLORIDE 0.9 % IV SOLN: 250.0000 mL | INTRAVENOUS | Status: AC | PRN

## 2024-06-24 MED ORDER — HEPARIN SODIUM (PORCINE) 1000 UNIT/ML IJ SOLN
INTRAMUSCULAR | Status: AC
Start: 2024-06-24 — End: 2024-06-24
  Filled 2024-06-24: qty 10

## 2024-06-24 SURGICAL SUPPLY — 15 items
BALLOON TREK RX 2.5X15 (BALLOONS) IMPLANT
BALLOON ~~LOC~~ EUPHORA RX 3.0X15 (BALLOONS) IMPLANT
CATH LAUNCHER 6FR EBU3.5 (CATHETERS) IMPLANT
DEVICE RAD TR BAND REGULAR (VASCULAR PRODUCTS) IMPLANT
DRAPE BRACHIAL (DRAPES) IMPLANT
GLIDESHEATH SLEND SS 6F .021 (SHEATH) IMPLANT
GUIDEWIRE INQWIRE 1.5J.035X260 (WIRE) IMPLANT
KIT ENCORE 26 ADVANTAGE (KITS) IMPLANT
PACK CARDIAC CATH (CUSTOM PROCEDURE TRAY) ×1 IMPLANT
SET ATX-X65L (MISCELLANEOUS) IMPLANT
STATION PROTECTION PRESSURIZED (MISCELLANEOUS) IMPLANT
STENT ONYX FRONTIER 2.75X22 (Permanent Stent) IMPLANT
TUBING CIL FLEX 10 FLL-RA (TUBING) IMPLANT
WIRE RUNTHROUGH .014X180CM (WIRE) IMPLANT
WIRE RUNTHROUGH IZANAI 014 180 (WIRE) IMPLANT

## 2024-06-24 NOTE — Progress Notes (Signed)
  Progress Note   Patient: Tyler David FMW:[id number] DOB: 24-Oct-1945 DOA: 06/21/2024     3 DOS: the patient was seen and examined on 06/24/2024   Brief hospital course: Tyler David is a 78 y.o. 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  She was given IV Lasix  for CHF exacerbation. Heart catheter was performed on 9/5, had a drug-eluting stent placed to mid RCA.  PCI and stent placed to circumflex 9/8.  The plan is continue aspirin  and Plavix  while in the hospital, transition to Plavix  and apixaban  prior to discharge.    Principal Problem:   HFrEF (heart failure with reduced ejection fraction) (HCC) Active Problems:   Chronic HFrEF (heart failure with reduced ejection fraction) (HCC)   Abnormal stress test   CHF (congestive heart failure) (HCC)   Coronary artery disease involving native coronary artery of native heart without angina pectoris   Ischemic congestive cardiomyopathy (HCC)   Overweight (BMI 25.0-29.9)   Unstable angina (HCC)   Hyponatremia   Assessment and Plan: Acute on chronic systolic congestive heart failure. Ischemic cardiomyopathy. Patient received IV Lasix , volume status has been better. Still euvolemic today.   Multivessel coronary disease status post CABG. Stable angina with failed bypass graft in RCA and left circumflex. Patient is status post PCI on RCA, and circumflex. Continue dual antiplatelet treatment, likely discharge home tomorrow with Plavix  and apixaban . Recheck of BMP tomorrow.   Essential hypertension. Amlodipine  discontinued, on metoprolol , hydralazine  and Imdur .   Chronic kidney disease stage IIIb. Hyponatremia. Recheck a BMP tomorrow.         Subjective:  Well today, no chest pain or shortness of breath.  Physical  Exam: Vitals:   06/24/24 1030 06/24/24 1100 06/24/24 1130 06/24/24 1145  BP: 135/62 135/63 130/67 132/62  Pulse: 64 64 72 66  Resp: 11 13 15 18   Temp:   98 F (36.7 C) 97.9 F (36.6 C)  TempSrc:   Oral   SpO2: 93% 93% 95% 98%  Weight:      Height:       General exam: Appears calm and comfortable  Respiratory system: Clear to auscultation. Respiratory effort normal. Cardiovascular system: S1 & S2 heard, RRR. No JVD, murmurs, rubs, gallops or clicks. No pedal edema. Gastrointestinal system: Abdomen is nondistended, soft and nontender. No organomegaly or masses felt. Normal bowel sounds heard. Central nervous system: Alert and oriented. No focal neurological deficits. Extremities: Symmetric 5 x 5 power. Skin: No rashes, lesions or ulcers Psychiatry: Judgement and insight appear normal. Mood & affect appropriate.    Data Reviewed:  Lab results reviewed.  Family Communication: Wife updated at bedside.  Disposition: Status is: Inpatient Remains inpatient appropriate because: Severity of disease, inpatient procedure.     Time spent: 35 minutes  Author: Murvin Mana, MD 06/24/2024 12:35 PM  For on call review [email or web address].

## 2024-06-24 NOTE — Interval H&P Note (Signed)
 History and Physical Interval Note:  06/24/2024 8:41 AM  Tyler David  has presented today for surgery, with the diagnosis of unstable angina.  The various methods of treatment have been discussed with the patient and family. After consideration of risks, benefits and other options for treatment, the patient has consented to  Procedure(s): CORONARY STENT INTERVENTION (N/A) as a surgical intervention.  The patient's history has been reviewed, patient examined, no change in status, stable for surgery.  I have reviewed the patient's chart and labs.  Questions were answered to the patient's satisfaction.     Aul Mangieri

## 2024-06-24 NOTE — Plan of Care (Signed)

## 2024-06-24 NOTE — Progress Notes (Signed)
 Heart Failure Nurse Navigator Progress Note  PCP: Arloa Elsie SAUNDERS, MD PCP-Cardiologist: Lonni Hanson, MD Admission Diagnosis:  Abnormal Stress Test Coronary Artery Disease involving native coronary artery of native heart without angina pectoris Ischemic Congestive Cardiomyopathy (HCC) S/P CABG x 3 Unstable Angina [facility]) Admitted from: Home  Presentation:   Tyler David Shingles presented with an abnormal stress PET exam.  History CAD, S/P CABG 01/04/2024, ischemic cardiomyopathy, Chronic HFrEF. Complicated by postop CVA's, A-fib on Eliquis , CKD III b.  No chest pain and never really had any.  Dysnea on exertion. Minimal lower extremity edema that goes away at night.  No orthopnea.   BNP & HS-Troponin were not measured. Chest x-ray  noted mild pulmonary edema and small pleural effusions.  ECHO/ LVEF: 35-40%  06/24/24-Right/Left Heart Cath and Coronary Angiography, Coronary Stent Intervention   Clinical Course:  Past Medical History:  Diagnosis Date   Arthritis    Cancer (HCC)    prostate cancer    Cardiomyopathy (HCC)    Chronic kidney disease, stage 3 (HCC)    Coronary artery disease    ED (erectile dysfunction)    GERD (gastroesophageal reflux disease)    Headache    hx of migraine-2015    Hyperlipidemia    Hypertension    Hypothyroidism    LBBB (left bundle branch block)    Migraine    Nocturia    Overactive bladder    Pneumonia    hx of at age 51      Social History   Socioeconomic History   Marital status: Married    Spouse name: Nena   Number of children: 2   Years of education: Not on file   Highest education level: Not on file  Occupational History   Not on file  Tobacco Use   Smoking status: Never   Smokeless tobacco: Never  Vaping Use   Vaping status: Never Used  Substance and Sexual Activity   Alcohol use: Not Currently    Comment: very seldom   Drug use: No   Sexual activity: Not on file  Other Topics Concern   Not on file  Social History  Narrative   Lives with [place], wife.   Social Drivers of Corporate investment banker Strain: Not on file  Food Insecurity: No Food Insecurity (06/23/2024)   Hunger Vital Sign    Worried About Running Out of Food in the Last Year: Never true    Ran Out of Food in the Last Year: Never true  Transportation Needs: No Transportation Needs (06/23/2024)   PRAPARE - Administrator, Civil Service (Medical): No    Lack of Transportation (Non-Medical): No  Physical Activity: Not on file  Stress: Not on file  Social Connections: Unknown (06/23/2024)   Social Connection and Isolation Panel    Frequency of Communication with Friends and Family: More than three times a week    Frequency of Social Gatherings with Friends and Family: Three times a week    Attends Religious Services: Not on file    Active Member of Clubs or Organizations: Not on file    Attends Club or Organization Meetings: Not on file    Marital Status: Not on file   Education Assessment and Provision:  Detailed education and instructions provided on heart failure disease management including the following to both patient and wife   Signs and symptoms of Heart Failure [place]) When to call the physician Importance of daily weights Low sodium diet Fluid restriction  Medication management Anticipated future follow-up appointments  Patient education given on each of the above topics.  Patient acknowledges understanding via teach back method and acceptance of all instructions.  Education Materials:  Living Better With Heart Failure Booklet, HF zone tool, & Daily Weight Tracker Tool.  Patient has scale at home: Yes Patient has pill box at home: Yes.  Wife prepares medications for him.    High Risk Criteria for Readmission and/or Poor Patient Outcomes: Heart failure hospital admissions (last 6 months): 2  No Show rate: 0 Difficult social situation: None Demonstrates medication adherence: Yes Primary Language:  English Literacy level: Reading, Writing, Comprehension  Barriers of Care:   Daily Weights  Considerations/Referrals:  Referral made to Heart Failure Pharmacist Stewardship: Yes Referral made to Heart Failure CSW/NCM TOC: No Referral made to Heart & Vascular TOC clinic: 07/02/24 @ 8:30 AM [facility]  Items for Follow-up on DC/TOC: Daily Weights Diet & Fluid Restrictions Continued Heart Failure Education  Charmaine Pines, RN, BSN [facility] Heart Failure Navigator Secure Chat Only

## 2024-06-24 NOTE — Progress Notes (Signed)
 Heart Failure Stewardship Pharmacy Note  PCP: Arloa Elsie SAUNDERS, MD PCP-Cardiologist: Lonni Hanson, MD  HPI: Tyler David is a 78 y.o. 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 Chief complaint of shortness of breath and lower extremity edema. On admission, BNP was not measured, HS-troponin was not measured, and serum creatinine was at baseline of ~1.8-2 mg/dL. Chest x-ray noted mild pulmonary edema and small pleural effusions.   Pertinent cardiac history: TTE in 01/2020 with LVEF of 55% and G1DD. Normal stress test 01/2020. TTE 10/2023 with LVEF of 35-40%, G1DD. LHC 11/2023 showed severe 3 vessel disease and patient was referred for CABG. RHC at the same time showed normal filling pressures and CI of 2.9. CABG performed 12/2023. TTE 04/2024 with LVEF of 25-30%, G2DD, mild MR, mild-moderate TR. LHC 06/2024 with patent LIMA to LAD and occluded SVG-OM1 and SVG-RPDA. RHC 06/2024 with RA of 13, PCWP of 30, Fick CO/CI of 4.7/2.4. Underwent successful bifurcation PCI of the left circumflex/OM1 with stent placement from the proximal left circumflex into the proximal OM1 this admission.   Pertinent Lab Values: Creatinine  Date Value Ref Range Status  09/16/2014 1.35 (H) 0.60 - 1.30 mg/dL Final   Creatinine, Ser  Date Value Ref Range Status  06/24/2024 1.88 (H) 0.61 - 1.24 mg/dL Final   BUN  Date Value Ref Range Status  06/24/2024 33 (H) 8 - 23 mg/dL Final  91/70/7974 29 (H) 8 - 27 mg/dL Final  87/98/7984 22 (H) 7 - 18 mg/dL Final   Potassium  Date Value Ref Range Status  06/24/2024 4.3 3.5 - 5.1 mmol/L Final  09/16/2014 3.9 3.5 - 5.1 mmol/L Final   Sodium  Date Value Ref Range Status  06/24/2024 131 (L) 135 - 145 mmol/L Final    Comment:    REPEATED TO VERIFY AB  06/14/2024 144 134 - 144 mmol/L Final   09/16/2014 137 136 - 145 mmol/L Final   B Natriuretic Peptide  Date Value Ref Range Status  01/15/2024 1,263.2 (H) 0.0 - 100.0 pg/mL Final    Comment:    Performed at [facility], [address] [place], [place] [address]   Magnesium   Date Value Ref Range Status  06/23/2024 2.2 1.7 - 2.4 mg/dL Final    Comment:    Performed at [facility], [address] [place], [place] [address]   Hgb A1c MFr Bld  Date Value Ref Range Status  01/02/2024 5.4 4.8 - 5.6 % Final    Comment:    (NOTE) Pre diabetes:          5.7%-6.4%  Diabetes:              >6.4%  Glycemic control for   <7.0% adults with diabetes     Vital Signs:  Temp:  [97.8 F (36.6 C)-98.9 F (37.2 C)] 98 F (36.7 C) (09/08 0754) Pulse Rate:  [59-70] 70 (09/08 0754) Cardiac Rhythm: Sinus bradycardia;Bundle branch block (09/08 0700) Resp:  [17-20] 18 (09/08 0754) BP: (122-149)/(55-69) 149/69 (09/08 0754) SpO2:  [95 %-100 %] 97 % (09/08 0754) Weight:  [84.4 kg (186 lb 1.1 oz)] 84.4 kg (186 lb 1.1 oz) (09/08 0500)  Intake/Output Summary (Last 24 hours) at 06/24/2024 0755 Last data filed at 06/24/2024 0741 Gross per 24 hour  Intake 420 ml  Output 1300 ml  Net -880 ml    Current Heart Failure Medications:  Loop diuretic: none Beta-Blocker: metoprolol  succinate 25 mg daily ACEI/ARB/ARNI: none MRA: none SGLT2i: none Other: hydralazine  25 mg TID + Imdur  30 mg daily  Prior to admission Heart Failure Medications:  Loop diuretic: furosemide  40 mg prn Beta-Blocker: metoprolol  succinate 25 mg daily ACEI/ARB/ARNI: none MRA: none SGLT2i: none Other: amlodipine  5 mg daily  Assessment: 1. Acute on chronic combined systolic and diastolic heart failure (LVEF 25-30%) with G2DD, due to ICM. NYHA class symptoms not assessed given patient in procedure area. -Symptoms: Symptoms improved from admission.  -Volume: Severely elevated filling pressures on RHC 06/21/24 s/p IV diuresis. Wt down 7 lbs.   -Hemodynamics: BP elevated and HR 60-70s.  -BB: Continue metoprolol  succinate 25 mg daily.  -ACEI/ARB/ARNI: Creatinine is at baseline of ~1.8. ARB not overtly contraindicated, but would not add until creatinine is stable after cath.  -MRA: Can consider outpatient if creatinine stable after cath. -SGLT2i: Consider adding SGLT2i tomorrow if creatinine stable, eGFR is 36.  Plan: 1) Medication changes recommended at this time: -None  2) Patient assistance: -Pending  3) Education: -To be completed prior to discharge.  Medication Assistance / Insurance Benefits Check: Does the patient have prescription insurance?    Type of insurance plan:  Does the patient qualify for medication assistance through manufacturers or grants? Pending   Please do not hesitate to reach out with questions or concerns,  Jaun Bash, PharmD, CPP, BCPS, [facility] Heart Failure Pharmacist  Phone - [phone] 06/24/2024 2:03 PM

## 2024-06-24 NOTE — Progress Notes (Signed)
 Progress Note  Patient Name: Tyler David Date of Encounter: 06/24/2024 [facility] Cardiologist: Lonni Hanson, MD   Interval Summary    He is status post PCI and drug-eluting stent placement to the left circumflex into OM1 this morning via the right radial artery with no complications.  Vital Signs Vitals:   06/24/24 1145 06/24/24 1354 06/24/24 1355 06/24/24 1559  BP: 132/62 (!) 115/52 (!) 115/52 129/61  Pulse: 66 61  63  Resp: 18 18  18   Temp: 97.9 F (36.6 C) 98.2 F (36.8 C)  98.3 F (36.8 C)  TempSrc:  Oral    SpO2: 98% 99%  96%  Weight:      Height:        Intake/Output Summary (Last 24 hours) at 06/24/2024 1606 Last data filed at 06/24/2024 1300 Gross per 24 hour  Intake 440 ml  Output 800 ml  Net -360 ml      06/24/2024    5:00 AM 06/23/2024    5:00 AM 06/22/2024    5:00 AM  Last 3 Weights  Weight (lbs) 186 lb 1.1 oz 184 lb 11.9 oz 188 lb 4.4 oz  Weight (kg) 84.4 kg 83.8 kg 85.4 kg      Telemetry/ECG  NSR HR 60s - Personally Reviewed  Physical Exam  GEN: No acute distress.   Neck: No JVD Cardiac: RRR, no murmurs, rubs, or gallops.  Respiratory: Clear to auscultation bilaterally. GI: Soft, nontender, non-distended  MS: No edema  Relevant CV studies: R/L heart cath Conclusions: Severe native coronary artery disease, as detailed below, including 70% mid LAD stenosis, 90% proximal/mid LCx lesion, 95% mid RCA stenosis, and chronic total occlusion of RPDA V with left-to-right and right to right collaterals. Widely patent LIMA-LAD. Occluded SVG-OM1 and SVG-RPDA. Moderately to severely elevated left heart, right heart, and pulmonary artery pressures. Mildly reduced Fick cardiac output/index. Successful PCI to mid RCA using Onyx frontier 3.5 x 18 mm drug-eluting stent with 0% residual stenosis and TIMI-3 flow.   Recommendations: Admit for post PCI care as well as diuresis and optimization of goal-directed medical therapy for acute on chronic  HFrEF complicated by CKD. Anticipate staged PCI to proximal/mid LCx prior to discharge once volume status and renal function have been optimized. Initiate heparin  infusion 2 hours after TR band has been removed. Continue aspirin  and clopidogrel  while in the hospital.  Will transition to clopidogrel  and apixaban  prior to discharge. Aggressive secondary prevention.  Continue ezetimibe  as inpatient, with plans to transition to PCSK9 inhibitor after discharge given that bempedoic acid /ezetimibe  was not approved by his insurance.   Lonni Hanson, MD [facility]   PET stress test 05/2024   LV perfusion is abnormal. There is evidence of ischemia. There is no evidence of infarction. Defect 1: There is a large defect with severe reduction in uptake present in the apical to basal inferior and inferolateral location(s) that is reversible. There is abnormal wall motion in the defect area. Consistent with ischemia.   Rest left ventricular function is abnormal. Rest global function is moderately reduced. There was a single regional abnormality. Rest EF: 31%. Stress left ventricular function is abnormal. Stress global function is moderately reduced. There was a single regional abnormality. Stress EF: 43%. End diastolic cavity size is moderately enlarged. End systolic cavity size is moderately enlarged.   Myocardial blood flow was computed to be 0.69ml/g/min at rest and 0.51ml/g/min at stress. Global myocardial blood flow reserve was 1.21 and was highly abnormal.   Coronary calcium   assessment not performed due to prior revascularization. Prior CABG   Findings are consistent with ischemia. The study is high risk.   Highly abnormal MBFR but in setting of prior CABG MVFR in ischemic area only 0.77     Echo 04/2024    1. Left ventricular ejection fraction, by estimation, is 25 to 30%. The  left ventricle has severely decreased function. The left ventricle  demonstrates global hypokinesis. The left ventricular  internal cavity size  was mildly dilated. Left ventricular  diastolic parameters are consistent with Grade II diastolic dysfunction  (pseudonormalization).   2. Right ventricular systolic function is normal. The right ventricular  size is normal. Tricuspid regurgitation signal is inadequate for assessing  PA pressure.   3. Left atrial size was moderately dilated.   4. The mitral valve is normal in structure. Mild mitral valve  regurgitation. No evidence of mitral stenosis.   5. Tricuspid valve regurgitation is mild to moderate.   6. The aortic valve is tricuspid. There is mild calcification of the  aortic valve. Aortic valve regurgitation is not visualized. Aortic valve  sclerosis/calcification is present, without any evidence of aortic  stenosis. Aortic valve mean gradient  measures 4.0 mmHg.   Comparison(s): EF has decreased from prior study. Distal anteroseptal wall  is dyskintic.   Assessment & Plan   CAD s/p CABG 12/2023 Abnormal stress test S/p PCI mRCA Early graft failure post CABG this year. - R/L heart cath showed severe native CAD, including 70% mLAD stenosis, 90% p/m Lcx lesion, 95% mRCA stenosis, and CTO RPDA V with L>R collaterals, patent LIMA-LAD, occluded SVG-OM1 and SVG to RPDA. Moderately elevated left and right heart pressures. Mildly reduced CO/CI - patient was treated with PCI mRCA and admitted post-cath.  Status post PCI and drug-eluting stent placement to the left circumflex into OM1 today with excellent results.   - continue ASA 81mg  daily and PLAvix  75mg  daily.  Once Eliquis  is resumed tomorrow, aspirin  can be discontinued. - metoprolol  25mg  daily - Monitor renal function closely overnight.  50 mL of contrast was used for the procedure.    ICM  HFrEF LVEF 25-30% - I discontinued amlodipine .  Given chronic kidney disease, no plans to use ACE inhibitors, ARB or MRA.  As an alternative, I started hydralazine  and Imdur . - Continue Toprol . Consider adding an  SGLT2 inhibitor as an outpatient.  Post-op Afib - CHADSVASC at least 5 - in NSR - Eliquis  can be resumed tomorrow morning.   LBBB - chronic LBBB - may need to consider CRT-D   HLD Statin intolerance - continue zetia  - LDL 112, goal<55 - Recommend an SGLT2 inhibitor as an outpatient  The patient can likely be discharged home tomorrow if he remains stable.    For questions or updates, please contact [facility] Please consult [email or web address] for contact info under       Signed, Deatrice Cage, MD

## 2024-06-24 NOTE — Telephone Encounter (Signed)
 Patient Product/process development scientist completed.    The patient is insured through [facility]. Patient has [employer], may use a copay card, and/or apply for patient assistance if available.    Ran test claim for Entresto  24-26 mg and the current 30 day co-pay is $0.00.  Ran test claim for Farxiga  10 mg and the current 30 day co-pay is $0.00.  Ran test claim for Jardiance 10 mg and the current 30 day co-pay is $0.00.  Ran test claim for Repatha  140 mg/ml and Requires Prior Authorization  This test claim was processed through [facility]- copay amounts may vary at other pharmacies due to [employer], or as the patient moves through the different stages of their insurance plan.     Reyes Sharps, CPHT Pharmacy Technician III Certified Patient Advocate [facility] Patient Advocate Team Direct Number: [phone]  Fax: [phone]

## 2024-06-24 NOTE — Consult Note (Signed)
 Pharmacy Consult Note - Anticoagulation  Pharmacy Consult for Heparin  Indication: atrial fibrillation Allergies  Allergen Reactions   Lipitor [Atorvastatin] Other (See Comments)    Myalgias    Pravachol [Pravastatin] Other (See Comments)    Myalgias    Statins Other (See Comments)    Myalgias    Zocor [Simvastatin] Other (See Comments)    Myalgias    Penicillins Rash    Other Reaction(s): HIVES, Unknown    PATIENT MEASUREMENTS: Height: 5' 7.5 (171.5 cm) Weight: 83.8 kg (184 lb 11.9 oz) IBW/kg (Calculated) : 67.25 HEPARIN  DW (KG): 85.2  VITAL SIGNS: Temp: 98.9 F (37.2 C) (09/08 0445) BP: 139/62 (09/08 0445) Pulse Rate: 60 (09/08 0445)  Recent Labs    06/21/24 0822 06/21/24 1303 06/23/24 0523 06/24/24 0352  HGB  --    < > 11.4* 11.7*  HCT  --    < > 35.8* 36.5*  PLT  --    < > 196 186  APTT  --    < > 100* 88*  LABPROT 14.1  --   --   --   INR 1.0  --   --   --   HEPARINUNFRC  --    < > 0.36 0.26*  CREATININE  --    < > 1.84*  --    < > = values in this interval not displayed.    Estimated Creatinine Clearance: 34 mL/min (A) (by C-G formula based on SCr of 1.84 mg/dL (H)).  PAST MEDICAL HISTORY: Past Medical History:  Diagnosis Date   Arthritis    Cancer (HCC)    prostate cancer    Cardiomyopathy (HCC)    Chronic kidney disease, stage 3 (HCC)    Coronary artery disease    ED (erectile dysfunction)    GERD (gastroesophageal reflux disease)    Headache    hx of migraine-2015    Hyperlipidemia    Hypertension    Hypothyroidism    LBBB (left bundle branch block)    Migraine    Nocturia    Overactive bladder    Pneumonia    hx of at age 71     Medications:  Medications Prior to Admission  Medication Sig Dispense Refill Last Dose/Taking   amLODipine  (NORVASC ) 5 MG tablet Take 1 tablet (5 mg total) by mouth daily. (Patient taking differently: Take 5 mg by mouth every evening.) 90 tablet 3 06/20/2024 Evening   apixaban  (ELIQUIS ) 5 MG TABS tablet Take  1 tablet (5 mg total) by mouth 2 (two) times daily. 180 tablet 3 06/18/2024 Evening   aspirin  EC 81 MG tablet Take 1 tablet (81 mg total) by mouth daily. Swallow whole.   06/21/2024 at  6:15 AM   cholecalciferol  (VITAMIN D3) 25 MCG (1000 UNIT) tablet Take 1 tablet (1,000 Units total) by mouth daily. (Patient taking differently: Take 1,000 Units by mouth every evening.) 30 tablet 0 06/20/2024 Evening   ezetimibe  (ZETIA ) 10 MG tablet Take 1 tablet (10 mg total) by mouth daily. 90 tablet 3 06/21/2024 at  6:15 AM   famotidine  (PEPCID ) 20 MG tablet Take 1 tablet (20 mg total) by mouth every evening. (Patient taking differently: Take 20 mg by mouth every 3 (three) days. Taking in the evening) 30 tablet 0 06/20/2024 Evening   furosemide  (LASIX ) 40 MG tablet Take 1 tablet (40 mg total) by mouth daily as needed (for increased shortness of breath, lower extremity swelling, or weight gain greater than 3 pounds overnight or 5 pounds in a  7-day time span). 30 tablet 6 06/18/2024   levothyroxine  (SYNTHROID ) 75 MCG tablet Take 75 mcg by mouth every morning.   06/21/2024 Morning   MAGNESIUM  PO Take 1 tablet by mouth at bedtime.   06/20/2024 Bedtime   metoprolol  succinate (TOPROL -XL) 25 MG 24 hr tablet Take 1 tablet (25 mg total) by mouth daily. Take with or immediately following a meal. 90 tablet 3 06/21/2024 at  6:15 AM   Multiple Vitamins-Minerals (PRESERVISION AREDS 2) CAPS Take 1 capsule by mouth in the morning and at bedtime.   06/20/2024 Evening   Omega-3 Fatty Acids (FISH OIL) 1000 MG CAPS Take 1,000 mg by mouth daily.   06/20/2024 Morning   Bempedoic Acid  (NEXLETOL ) 180 MG TABS Take 1 tablet (180 mg total) by mouth daily. (Patient not taking: No sig reported) 90 tablet 3 Not Taking   nitroGLYCERIN  (NITROSTAT ) 0.4 MG SL tablet Place 1 tablet (0.4 mg total) under the tongue every 5 (five) minutes as needed for chest pain. (Patient not taking: Reported on 06/21/2024) 25 tablet 0 Not Taking   Scheduled:   amLODipine   5 mg Oral Daily    aspirin  EC  81 mg Oral Daily   clopidogrel   75 mg Oral Q breakfast   ezetimibe   10 mg Oral Daily   levothyroxine   75 mcg Oral Q0600   metoprolol  succinate  25 mg Oral Daily   sodium chloride  flush  3 mL Intravenous Q12H   sodium chloride  flush  3 mL Intravenous Q12H   Infusions:   heparin  1,000 Units/hr (06/23/24 2155)   PRN: acetaminophen , famotidine , nitroGLYCERIN , ondansetron  (ZOFRAN ) IV, sodium chloride  flush, sodium chloride  flush  ASSESSMENT: 78 y.o. 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 Patient is Abixaban per chart review. Pharmacy has been consulted to initiate and manage heparin  intravenous infusion. Patient received x5 doses of heparin  1,000 units during procedure.    Goal(s) of therapy: Heparin  level 0.3 - 0.7 units/mL aPTT 66 - 102 seconds Monitor platelets by anticoagulation protocol: Yes   Baseline anticoagulation labs: Recent Labs    06/21/24 0822 06/21/24 1303 06/22/24 1109 06/22/24 1523 06/22/24 2252 06/23/24 0523 06/24/24 0352  APTT  --    < >  --    < > 94* 100* 88*  INR 1.0  --   --   --   --   --   --   HGB  --    < > 12.8*  --   --  11.4* 11.7*  PLT  --    < > 208  --   --  196 186   < > = values in this interval not displayed.    09/06 0500 aPTT 180 supratherapeutic / HL 0.64 therapeutic, not correlating 09/06 1523 aPTT 84, therapeutic x 1 09/06 2252 aPTT 94, therapeutic x 2 09/07 0523 aPTT 100, therapeutic x 3 09/08 0352 aPTT 88, therapeutic x 4 / HL 0.26  PLAN: Continue heparin  infusion at 1000 units/hour. Recheck aPTT daily w/ AM labs while therapeutic Continue to titrate by aPTT until heparin  level and aPTT correlate, then titrate by heparin  level alone. Check heparin  level with next AM labs. Continue to monitor CBC daily while on heparin  infusion.  Thank you  for involving pharmacy in this patient's care.   Rankin CANDIE Dills, PharmD, [facility] 06/24/2024 5:10 AM

## 2024-06-24 NOTE — Progress Notes (Signed)
 Family and pt would like to discuss w/ cards prior to d/c:  - should pt be on a light duty at home, and for how long (24 hr care and education given)  - when to get a repeat echo (pt has f/u appt in 2 weeks)  - can we do a first fill at the hospital (TOC notified)

## 2024-06-24 NOTE — TOC CM/SW Note (Signed)
 Transition of Care [facility]) - Inpatient Brief Assessment   Patient Details  Name: Tyler David MRN: [id number] Date of Birth: 03/22/1946  [facility]) CM/SW Contact:    Lauraine JAYSON Carpen, LCSW Phone Number: 06/24/2024, 4:41 PM   Clinical Narrative: CSW reviewed chart. No TOC needs identified at this time. CSW will continue to follow progress. Please place [facility] consult if any needs arise.  Transition of Care Asessment: Insurance and Status: Insurance coverage has been reviewed Patient has primary care physician: Yes Home environment has been reviewed: Single family home Prior level of function:: Not documented Prior/Current Home Services: No current home services Social Drivers of Health Review: SDOH reviewed no interventions necessary Readmission risk has been reviewed: Yes Transition of care needs: no transition of care needs at this time

## 2024-06-25 ENCOUNTER — Other Ambulatory Visit (HOSPITAL_COMMUNITY): Payer: Self-pay

## 2024-06-25 ENCOUNTER — Telehealth (HOSPITAL_COMMUNITY): Payer: Self-pay | Admitting: Pharmacy Technician

## 2024-06-25 ENCOUNTER — Telehealth: Payer: Self-pay

## 2024-06-25 ENCOUNTER — Ambulatory Visit: Admitting: Physician Assistant

## 2024-06-25 ENCOUNTER — Other Ambulatory Visit: Payer: Self-pay

## 2024-06-25 DIAGNOSIS — I9789 Other postprocedural complications and disorders of the circulatory system, not elsewhere classified: Secondary | ICD-10-CM | POA: Diagnosis not present

## 2024-06-25 DIAGNOSIS — I5023 Acute on chronic systolic (congestive) heart failure: Secondary | ICD-10-CM

## 2024-06-25 DIAGNOSIS — Z79899 Other long term (current) drug therapy: Secondary | ICD-10-CM

## 2024-06-25 DIAGNOSIS — I255 Ischemic cardiomyopathy: Secondary | ICD-10-CM | POA: Diagnosis not present

## 2024-06-25 DIAGNOSIS — I420 Dilated cardiomyopathy: Secondary | ICD-10-CM | POA: Diagnosis not present

## 2024-06-25 DIAGNOSIS — I4891 Unspecified atrial fibrillation: Secondary | ICD-10-CM

## 2024-06-25 DIAGNOSIS — I5022 Chronic systolic (congestive) heart failure: Secondary | ICD-10-CM | POA: Diagnosis not present

## 2024-06-25 DIAGNOSIS — I2510 Atherosclerotic heart disease of native coronary artery without angina pectoris: Secondary | ICD-10-CM | POA: Diagnosis not present

## 2024-06-25 LAB — BASIC METABOLIC PANEL WITH GFR
Anion gap: 9 (ref 5–15)
BUN: 35 mg/dL — ABNORMAL HIGH (ref 8–23)
CO2: 23 mmol/L (ref 22–32)
Calcium: 8.3 mg/dL — ABNORMAL LOW (ref 8.9–10.3)
Chloride: 106 mmol/L (ref 98–111)
Creatinine, Ser: 1.91 mg/dL — ABNORMAL HIGH (ref 0.61–1.24)
GFR, Estimated: 35 mL/min — ABNORMAL LOW (ref >60)
Glucose, Bld: 97 mg/dL (ref 70–99)
Potassium: 4.5 mmol/L (ref 3.5–5.1)
Sodium: 138 mmol/L (ref 135–145)

## 2024-06-25 LAB — CBC
HCT: 34.1 % — ABNORMAL LOW (ref 39.0–52.0)
Hemoglobin: 11 g/dL — ABNORMAL LOW (ref 13.0–17.0)
MCH: 28 pg (ref 26.0–34.0)
MCHC: 32.3 g/dL (ref 30.0–36.0)
MCV: 86.8 fL (ref 80.0–100.0)
Platelets: 193 K/uL (ref 150–400)
RBC: 3.93 MIL/uL — ABNORMAL LOW (ref 4.22–5.81)
RDW: 14.8 % (ref 11.5–15.5)
WBC: 8.1 K/uL (ref 4.0–10.5)
nRBC: 0 % (ref 0.0–0.2)

## 2024-06-25 MED ORDER — FUROSEMIDE 40 MG PO TABS
40.0000 mg | ORAL_TABLET | Freq: Every day | ORAL | Status: DC
  Administered 2024-06-25: 40 mg via ORAL
  Filled 2024-06-25: qty 1

## 2024-06-25 MED ORDER — SACUBITRIL-VALSARTAN 24-26 MG PO TABS
1.0000 | ORAL_TABLET | Freq: Two times a day (BID) | ORAL | Status: DC
  Administered 2024-06-25: 1 via ORAL
  Filled 2024-06-25: qty 1

## 2024-06-25 MED ORDER — APIXABAN 5 MG PO TABS
5.0000 mg | ORAL_TABLET | Freq: Two times a day (BID) | ORAL | Status: DC
  Administered 2024-06-25: 5 mg via ORAL
  Filled 2024-06-25: qty 1

## 2024-06-25 MED ORDER — REPATHA SURECLICK 140 MG/ML ~~LOC~~ SOAJ
140.0000 mg | SUBCUTANEOUS | 2 refills | Status: DC
  Filled 2024-06-25: qty 2, 28d supply, fill #0
  Filled 2024-07-17: qty 2, 28d supply, fill #1
  Filled 2024-08-13: qty 2, 28d supply, fill #2

## 2024-06-25 MED ORDER — HYDRALAZINE HCL 25 MG PO TABS
25.0000 mg | ORAL_TABLET | Freq: Three times a day (TID) | ORAL | 0 refills | Status: DC
  Filled 2024-06-25: qty 90, 30d supply, fill #0

## 2024-06-25 MED ORDER — FUROSEMIDE 40 MG PO TABS
40.0000 mg | ORAL_TABLET | Freq: Every day | ORAL | 0 refills | Status: DC
  Filled 2024-06-25: qty 30, 30d supply, fill #0

## 2024-06-25 MED ORDER — ISOSORBIDE MONONITRATE ER 30 MG PO TB24
30.0000 mg | ORAL_TABLET | Freq: Every day | ORAL | 0 refills | Status: DC
  Filled 2024-06-25: qty 30, 30d supply, fill #0

## 2024-06-25 MED ORDER — CLOPIDOGREL BISULFATE 75 MG PO TABS
75.0000 mg | ORAL_TABLET | Freq: Every day | ORAL | 0 refills | Status: DC
  Filled 2024-06-25: qty 30, 30d supply, fill #0

## 2024-06-25 MED ORDER — SACUBITRIL-VALSARTAN 24-26 MG PO TABS
1.0000 | ORAL_TABLET | Freq: Two times a day (BID) | ORAL | 0 refills | Status: DC
  Filled 2024-06-25: qty 60, 30d supply, fill #0

## 2024-06-25 NOTE — Progress Notes (Signed)
 Progress Note  Patient Name: Tyler David Date of Encounter: 06/25/2024 [facility] Cardiologist: Lonni Hanson, MD   Interval Summary   Feeling okay without chest pain or shortness of breath.  No discomfort at right radial arteriotomy site.  Eager to go home.  Vital Signs Vitals:   06/25/24 0500 06/25/24 0736 06/25/24 0915 06/25/24 1121  BP:  133/62 133/62 129/65  Pulse:  68 68 69  Resp:  16  18  Temp:  (!) 97.5 F (36.4 C)  98.1 F (36.7 C)  TempSrc:      SpO2:  97%  97%  Weight: 85.7 kg     Height:        Intake/Output Summary (Last 24 hours) at 06/25/2024 1303 Last data filed at 06/25/2024 0917 Gross per 24 hour  Intake 1203 ml  Output 975 ml  Net 228 ml      06/25/2024    5:00 AM 06/24/2024    5:00 AM 06/23/2024    5:00 AM  Last 3 Weights  Weight (lbs) 188 lb 14.4 oz 186 lb 1.1 oz 184 lb 11.9 oz  Weight (kg) 85.684 kg 84.4 kg 83.8 kg      Telemetry/ECG  Sinus rhythm - Personally Reviewed  Physical Exam  GEN: No acute distress.   Neck: No JVD Cardiac: RRR, no murmurs, rubs, or gallops.  Right radial arteriotomy site with minimal bruising.  Site covered with clean dressing.  No hematoma. Respiratory: Clear to auscultation bilaterally. GI: Soft, nontender, non-distended  MS: No edema  Assessment & Plan  Coronary artery disease: Tyler David is feeling well status post PCI to RCA and subsequent staged PCI to the LCx/OM yesterday.  His LAD is supplied by a patent LIMA graft.  We will continue with aggressive secondary prevention including; plan to initiate evolocumab  140 mg subcutaneously every 2 weeks.  Given that radial catheterization site appears benign, we will resume apixaban  today with plans for apixaban  plus clopidogrel  for at least 6 months.  Acute on chronic HFrEF: Tyler David is down 7 pounds despite not receiving any diuretics over the weekend or yesterday.  He does not appear significantly volume overloaded though his left and right heart  pressures were moderately severely elevated on Friday.  We will initiate furosemide  40 mg daily.  Continue hydralazine  and isosorbide  mononitrate as well as low-dose metoprolol  succinate as resting heart rates in the 60s limits escalation of metoprolol  at this time.  Given that his renal function has been stable, we have agreed to challenge him with Entresto  24-26 mg twice daily.  Tyler David will need a basic metabolic panel early next week.  He is also scheduled for follow-up in the heart failure clinic on 9/16; I will see if he can see one of the heart failure MDs that day.  If LVEF does not improve with GDMT and revascularization this admission, referral to EP for consideration of CRT-D would need to be pursued.  Postoperative atrial fibrillation: Tyler David is maintaining sinus rhythm.  Resume apixaban  today.  Continue low-dose metoprolol .  Hyperlipidemia: Continue ezetimibe .  Initiate evolocumab  140 mg subcutaneously every 2 weeks at discharge (prior approval has been obtained;.  Our pharmacy team has provided teaching to Tyler David.  [facility] will sign off.   The patient is ready for discharge today from a cardiac standpoint. Medication Recommendations: See above. Other recommendations (labs, testing, etc): BMP next Monday (07/01/2024) Follow up as an outpatient: Advanced heart failure clinic on 9/16; follow-up with me  or APP in our office in 2 to 3 weeks.  For questions or updates, please contact [facility] Please consult [email or web address] for contact info under [facility].     Signed, Lonni Hanson, MD

## 2024-06-25 NOTE — Telephone Encounter (Signed)
 Pharmacy Patient Advocate Encounter  Received notification from [facility] that Prior Authorization for Repatha  SureClick 140MG /ML auto-injectors  has been APPROVED from 06/25/2024 to 12/22/2024. Ran test claim, Copay is $0.00. This test claim was processed through [facility]- copay amounts may vary at other pharmacies due to pharmacy/plan contracts, or as the patient moves through the different stages of their insurance plan.   PA #/Case ID/Reference #: [id number] KEY: FRANK

## 2024-06-25 NOTE — Plan of Care (Signed)
  Problem: Education: Goal: Understanding of CV disease, CV risk reduction, and recovery process will improve Outcome: Progressing   Problem: Activity: Goal: Ability to return to baseline activity level will improve Outcome: Progressing   Problem: Cardiovascular: Goal: Vascular access site(s) Level 0-1 will be maintained Outcome: Progressing   Problem: Health Behavior/Discharge Planning: Goal: Ability to safely manage health-related needs after discharge will improve Outcome: Progressing   Problem: Education: Goal: Knowledge of General Education information will improve Description: Including pain rating scale, medication(s)/side effects and non-pharmacologic comfort measures Outcome: Progressing   Problem: Clinical Measurements: Goal: Cardiovascular complication will be avoided Outcome: Progressing

## 2024-06-25 NOTE — Plan of Care (Signed)
  Problem: Education: Goal: Understanding of CV disease, CV risk reduction, and recovery process will improve Outcome: Completed/Met Goal: Individualized Educational Video(s) Outcome: Completed/Met   Problem: Activity: Goal: Ability to return to baseline activity level will improve Outcome: Completed/Met   Problem: Cardiovascular: Goal: Ability to achieve and maintain adequate cardiovascular perfusion will improve Outcome: Completed/Met Goal: Vascular access site(s) Level 0-1 will be maintained Outcome: Completed/Met   Problem: Health Behavior/Discharge Planning: Goal: Ability to safely manage health-related needs after discharge will improve Outcome: Completed/Met   Problem: Education: Goal: Knowledge of General Education information will improve Description: Including pain rating scale, medication(s)/side effects and non-pharmacologic comfort measures Outcome: Completed/Met   Problem: Health Behavior/Discharge Planning: Goal: Ability to manage health-related needs will improve Outcome: Completed/Met   Problem: Clinical Measurements: Goal: Ability to maintain clinical measurements within normal limits will improve Outcome: Completed/Met Goal: Will remain free from infection Outcome: Completed/Met Goal: Diagnostic test results will improve Outcome: Completed/Met Goal: Respiratory complications will improve Outcome: Completed/Met Goal: Cardiovascular complication will be avoided Outcome: Completed/Met   Problem: Activity: Goal: Risk for activity intolerance will decrease Outcome: Completed/Met   Problem: Nutrition: Goal: Adequate nutrition will be maintained Outcome: Completed/Met   Problem: Coping: Goal: Level of anxiety will decrease Outcome: Completed/Met   Problem: Elimination: Goal: Will not experience complications related to bowel motility Outcome: Completed/Met Goal: Will not experience complications related to urinary retention Outcome: Completed/Met    Problem: Pain Managment: Goal: General experience of comfort will improve and/or be controlled Outcome: Completed/Met   Problem: Safety: Goal: Ability to remain free from injury will improve Outcome: Completed/Met   Problem: Skin Integrity: Goal: Risk for impaired skin integrity will decrease Outcome: Completed/Met

## 2024-06-25 NOTE — Progress Notes (Signed)
 Pt and spouse given dc/rx instructions, including cath site instructions. Pt/and spouse advised to return to the ED as needed/worse. Pt and spouse voice understanding. No distress noted with pt. Pt getting dressed, has his belongings, cell phone/charger. No other valuables at bedside.

## 2024-06-25 NOTE — Progress Notes (Signed)
 Mobility Specialist Progress Note:    06/25/24 1033  Mobility  Activity Ambulated with assistance  Level of Assistance Standby assist, set-up cues, supervision of patient - no hands on  Assistive Device None  Distance Ambulated (ft) 45 ft  Range of Motion/Exercises Active;All extremities  Activity Response Tolerated well  Mobility visit 1 Mobility  Mobility Specialist Start Time (ACUTE ONLY) 1003  Mobility Specialist Stop Time (ACUTE ONLY) 1022  Mobility Specialist Time Calculation (min) (ACUTE ONLY) 19 min   Pt received in chair, agreeable to mobility. SBA to stand and ambulate. Tolerated well, HR 70-80 bpm throughout session. Pt denies any symptoms. Returned to chair, all needs met.  Sherrilee Ditty Mobility Specialist Please contact via Special educational needs teacher or  Rehab office at [phone]

## 2024-06-25 NOTE — Discharge Summary (Signed)
 Physician Discharge Summary   Patient: Tyler David MRN: [id number] DOB: [birth year]  Admit date:     06/21/2024  Discharge date: 06/25/24  Discharge Physician: Tyler David   PCP: Tyler Elsie SAUNDERS, MD   Recommendations at discharge:   Follow-up with PCP in 1 week. Follow-up with cardiology as scheduled. Check a BMP at her next office visit.  Discharge Diagnoses: Principal Problem:   HFrEF (heart failure with reduced ejection fraction) (HCC) Active Problems:   Chronic HFrEF (heart failure with reduced ejection fraction) (HCC)   Abnormal stress test   CHF (congestive heart failure) (HCC)   Coronary artery disease involving native coronary artery of native heart without angina pectoris   Ischemic congestive cardiomyopathy (HCC)   Overweight (BMI 25.0-29.9)   Unstable angina (HCC)   Hyponatremia  Resolved Problems:   * No resolved hospital problems. *  Hospital Course: Tyler David is a 78 y.o. 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  She was given IV Lasix  for CHF exacerbation. Heart catheter was performed on 9/5, had a drug-eluting stent placed to mid RCA.  PCI and stent placed to circumflex 9/8.  The plan is continue aspirin  and Plavix  while in the hospital, transition to Plavix  and apixaban  at discharge.   Assessment and Plan: Acute on chronic systolic congestive heart failure. Ischemic cardiomyopathy. Patient received IV Lasix , volume status has been better. Still euvolemic    Multivessel coronary disease status post CABG. Stable angina with failed bypass graft in RCA and left circumflex. Patient is status post PCI on RCA, and circumflex. Condition improved, medically stable for discharge.  Continue Plavix  and Eliquis .    Essential hypertension. Amlodipine   discontinued, on metoprolol , hydralazine  and Imdur .   Chronic kidney disease stage IIIb. Hyponatremia. Renal function stable.        Consultants: Cardiology Procedures performed: Heart cath  Disposition: Home Diet recommendation:  Cardiac diet DISCHARGE MEDICATION: Allergies as of 06/25/2024       Reactions   Lipitor [atorvastatin] Other (See Comments)   Myalgias    Pravachol [pravastatin] Other (See Comments)   Myalgias    Statins Other (See Comments)   Myalgias    Zocor [simvastatin] Other (See Comments)   Myalgias    Penicillins Rash   Other Reaction(s): HIVES, Unknown        Medication List     STOP taking these medications    amLODipine  5 MG tablet Commonly known as: NORVASC    aspirin  EC 81 MG tablet   Nexletol  180 MG Tabs Generic drug: Bempedoic Acid        TAKE these medications    apixaban  5 MG Tabs tablet Commonly known as: ELIQUIS  Take 1 tablet (5 mg total) by mouth 2 (two) times daily.   clopidogrel  75 MG tablet Commonly known as: PLAVIX  Take 1 tablet (75 mg total) by mouth daily with breakfast. Start taking on: June 26, 2024   ezetimibe  10 MG tablet Commonly known as: ZETIA  Take 1 tablet (10 mg total) by mouth daily.   famotidine  20 MG tablet Commonly known as: PEPCID  Take 1 tablet (20 mg total) by mouth every evening. What changed:  when to take this additional instructions   Fish Oil 1000 MG Caps Take 1,000 mg by mouth daily.   furosemide  40 MG tablet Commonly known as: LASIX  Take 1 tablet (  40 mg total) by mouth daily. Start taking on: June 26, 2024 What changed:  when to take this reasons to take this   hydrALAZINE  25 MG tablet Commonly known as: APRESOLINE  Take 1 tablet (25 mg total) by mouth every 8 (eight) hours.   isosorbide  mononitrate 30 MG 24 hr tablet Commonly known as: IMDUR  Take 1 tablet (30 mg total) by mouth daily. Start taking on: June 26, 2024   levothyroxine  75 MCG tablet Commonly  known as: SYNTHROID  Take 75 mcg by mouth every morning.   MAGNESIUM  PO Take 1 tablet by mouth at bedtime.   metoprolol  succinate 25 MG 24 hr tablet Commonly known as: TOPROL -XL Take 1 tablet (25 mg total) by mouth daily. Take with or immediately following a meal.   nitroGLYCERIN  0.4 MG SL tablet Commonly known as: Nitrostat  Place 1 tablet (0.4 mg total) under the tongue every 5 (five) minutes as needed for chest pain.   PreserVision AREDS 2 Caps Take 1 capsule by mouth in the morning and at bedtime.   Repatha  SureClick 140 MG/ML Soaj Generic drug: Evolocumab  Inject 140 mg into the skin every 14 (fourteen) days.   sacubitril -valsartan  24-26 MG Commonly known as: ENTRESTO  Take 1 tablet by mouth 2 (two) times daily.   vitamin D3 25 MCG tablet Commonly known as: CHOLECALCIFEROL  Take 1 tablet (1,000 Units total) by mouth daily. What changed: when to take this        Follow-up Information     [facility]. Go on 07/02/2024.   Specialty: Cardiology Why: Hospital Follow-Up 07/02/24 @ 8:30 AM Please bring all medications to follow-up appointment [facility] [address] Free Valet Parking at the door Contact information: [address]  [place]  [address] [phone]        Tyler Elsie SAUNDERS, MD Follow up in 1 week(s).   Specialty: Family Medicine Contact information: [phone] [place] [address] [phone]                Discharge Exam: Filed Weights   06/23/24 0500 06/24/24 0500 06/25/24 0500  Weight: 83.8 kg 84.4 kg 85.7 kg   General exam: Appears calm and comfortable  Respiratory system: Clear to auscultation. Respiratory effort normal. Cardiovascular system: S1 & S2 heard, RRR. No JVD, murmurs, rubs, gallops or clicks. No pedal edema. Gastrointestinal system: Abdomen is nondistended, soft and nontender. No organomegaly or masses felt.  Normal bowel sounds heard. Central nervous system: Alert and oriented. No focal neurological deficits. Extremities: Symmetric 5 x 5 power. Skin: No rashes, lesions or ulcers Psychiatry: Judgement and insight appear normal. Mood & affect appropriate.    Condition at discharge: good  The results of significant diagnostics from this hospitalization (including imaging, microbiology, ancillary and laboratory) are listed below for reference.   Imaging Studies: CARDIAC CATHETERIZATION Result Date: 06/24/2024   Mid LAD lesion is 70% stenosed.   Prox Cx to Mid Cx lesion is 90% stenosed.   1st Diag lesion is 50% stenosed.   1st Mrg lesion is 50% stenosed.   A stent was successfully placed.   A drug-eluting stent was successfully placed using a STENT ONYX FRONTIER [id number].   in the main branch and side branch.   in the main branch.   Angioplasty was performed in the main branch. .   Angioplasty was performed in the main branch and side branch. .   Post intervention, there is a 0% residual stenosis.  Post intervention, there is a 0% residual stenosis. Successful bifurcation PCI of the left circumflex/OM1 with stent placement from the proximal left circumflex into the proximal OM1.  Balloon angioplasty was performed initially into the left circumflex itself. Recommendations: Eliquis  can be resumed tomorrow morning if no bleeding issues.  Once Eliquis  is resumed, aspirin  can be stopped and Plavix  can be continued for 6 to 12 months. Given ischemic cardiomyopathy, amlodipine  was discontinued and I added a small dose hydralazine  and Imdur  instead. Given underlying chronic kidney disease, will give gentle hydration to decrease the chance of contrast-induced nephropathy.  50 mL of contrast was used for the procedure.  DG Chest 1 View Result Date: 06/21/2024 CLINICAL DATA:  Congestive heart failure. EXAM: CHEST  1 VIEW COMPARISON:  Radiograph 02/20/2024. Included portions from cardiac PET CT 06/13/2024 FINDINGS: Prior  median sternotomy. The heart is enlarged. Stable mediastinal contours. Mild pulmonary edema and small pleural effusions. No confluent airspace disease. No pneumothorax. IMPRESSION: Cardiomegaly with mild pulmonary edema and small pleural effusions. Electronically Signed   By: Andrea Gasman M.D.   On: 06/21/2024 16:55   CARDIAC CATHETERIZATION Result Date: 06/21/2024 Conclusions: Severe native coronary artery disease, as detailed below, including 70% mid LAD stenosis, 90% proximal/mid LCx lesion, 95% mid RCA stenosis, and chronic total occlusion of RPDA V with left-to-right and right to right collaterals. Widely patent LIMA-LAD. Occluded SVG-OM1 and SVG-RPDA. Moderately to severely elevated left heart, right heart, and pulmonary artery pressures. Mildly reduced Fick cardiac output/index. Successful PCI to mid RCA using Onyx frontier 3.5 x 18 mm drug-eluting stent with 0% residual stenosis and TIMI-3 flow. Recommendations: Admit for post PCI care as well as diuresis and optimization of goal-directed medical therapy for acute on chronic HFrEF complicated by CKD. Anticipate staged PCI to proximal/mid LCx prior to discharge once volume status and renal function have been optimized. Initiate heparin  infusion 2 hours after TR band has been removed. Continue aspirin  and clopidogrel  while in the hospital.  Will transition to clopidogrel  and apixaban  prior to discharge. Aggressive secondary prevention.  Continue ezetimibe  as inpatient, with plans to transition to PCSK9 inhibitor after discharge given that bempedoic acid /ezetimibe  was not approved by his insurance. Lonni Hanson, MD [facility]  NM PET CT CARDIAC PERFUSION MULTI W/ABSOLUTE BLOODFLOW Result Date: 06/13/2024   LV perfusion is abnormal. There is evidence of ischemia. There is no evidence of infarction. Defect 1: There is a large defect with severe reduction in uptake present in the apical to basal inferior and inferolateral location(s) that is  reversible. There is abnormal wall motion in the defect area. Consistent with ischemia.   Rest left ventricular function is abnormal. Rest global function is moderately reduced. There was a single regional abnormality. Rest EF: 31%. Stress left ventricular function is abnormal. Stress global function is moderately reduced. There was a single regional abnormality. Stress EF: 43%. End diastolic cavity size is moderately enlarged. End systolic cavity size is moderately enlarged.   Myocardial blood flow was computed to be 0.26ml/g/min at rest and 0.67ml/g/min at stress. Global myocardial blood flow reserve was 1.21 and was highly abnormal.   Coronary calcium  assessment not performed due to prior revascularization. Prior CABG   Findings are consistent with ischemia. The study is high risk.   Highly abnormal MBFR but in setting of prior CABG MVFR in ischemic area only 0.77 CLINICAL DATA:  This over-read does not include interpretation of cardiac or coronary anatomy or pathology. The cardiac SPECT CT interpretation by the cardiologist is attached.  COMPARISON:  Chest CT of 10/13/2023 FINDINGS: Tiny bilateral pleural effusions are unchanged. Clear imaged lungs. Aortic atherosclerosis. Interval median sternotomy. Incompletely imaged anterior mediastinal fluid on image 1/3 is presumably postoperative. Pulmonary artery enlargement, outflow tract 4.4 cm. No imaged thoracic adenopathy. Tiny hiatal hernia. Normal imaged portions of the liver, spleen, pancreas, adrenal glands. Intact sternotomy wires.  Old left rib fractures. IMPRESSION: No acute findings in the imaged extracardiac chest. Interval median sternotomy. Incompletely imaged ill-defined small volume anterior mediastinal fluid is likely postoperative. Pulmonary artery enlargement suggests pulmonary arterial hypertension. Similar tiny bilateral pleural effusions. Electronically Signed   By: Rockey Kilts M.D.   On: 06/13/2024 13:39   Microbiology: Results for orders  placed or performed during the hospital encounter of 01/02/24  Surgical pcr screen     Status: None   Collection Time: 01/02/24  1:31 PM   Specimen: Nasal Mucosa; Nasal Swab  Result Value Ref Range Status   MRSA, PCR NEGATIVE NEGATIVE Final   Staphylococcus aureus NEGATIVE NEGATIVE Final    Comment: (NOTE) The Xpert SA Assay (FDA approved for NASAL specimens in patients 86 years of age and older), is one component of a comprehensive surveillance program. It is not intended to diagnose infection nor to guide or monitor treatment. Performed at [facility], [address] [place], [place] [address]     Labs: CBC: Recent Labs  Lab 06/22/24 0500 06/22/24 1109 06/23/24 0523 06/24/24 0352 06/25/24 0442  WBC 9.3 9.6 7.4 7.5 8.1  HGB 11.9* 12.8* 11.4* 11.7* 11.0*  HCT 36.8* 39.0 35.8* 36.5* 34.1*  MCV 85.6 84.8 86.7 87.1 86.8  PLT 191 208 196 186 193   Basic Metabolic Panel: Recent Labs  Lab 06/22/24 0500 06/22/24 1109 06/23/24 0523 06/24/24 0352 06/25/24 0442  NA 140 138 138 131* 138  K 3.5 4.2 4.0 4.3 4.5  CL 106 105 106 102 106  CO2 25 24 25 23 23   GLUCOSE 90 106* 91 86 97  BUN 38* 36* 34* 33* 35*  CREATININE 2.04* 2.00* 1.84* 1.88* 1.91*  CALCIUM  8.0* 8.4* 8.3* 7.6* 8.3*  MG  --   --  2.2  --   --    Liver Function Tests: No results for input(s): AST, ALT, ALKPHOS, BILITOT, PROT, ALBUMIN  in the last 168 hours. CBG: Recent Labs  Lab 06/24/24 0735  GLUCAP 85    Discharge time spent: greater than 30 minutes.  Signed: Murvin Mana, MD [facility] 06/25/2024

## 2024-06-25 NOTE — Care Management Important Message (Signed)
 Important Message  Patient Details  Name: Tyler David MRN: [id number] Date of Birth: [birth year]   Important Message Given:  Yes - Medicare IM     Rojelio SHAUNNA Rattler 06/25/2024, 12:06 PM

## 2024-06-25 NOTE — Telephone Encounter (Signed)
 Pt made aware of recommendations and verbalized understanding.  BMP order placed Pt has an appointment scheduled with Bernardino Bring, PA-C  07/09/24  End, Lonni, MD  P Cv Div Burl Scheduling; Desiderio Russell SAILOR, RN Good afternoon,  Would be possible to schedule Mr. Rafter to follow-up with me or an APP in 2 to 3 weeks?  Also, he will need a basic metabolic panel for follow-up of his CKD next Monday (07/01/2024).  Let me know if any questions or concerns arise.  Thanks.  [place]

## 2024-06-25 NOTE — Progress Notes (Signed)
 Heart Failure Stewardship Pharmacy Note  PCP: Arloa Elsie SAUNDERS, MD PCP-Cardiologist: Lonni Hanson, MD  HPI: Tyler David is a 78 y.o. 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 Chief complaint of shortness of breath and lower extremity edema. On admission, BNP was not measured, HS-troponin was not measured, and serum creatinine was at baseline of ~1.8-2 mg/dL. Chest x-ray noted mild pulmonary edema and small pleural effusions.   Pertinent cardiac history: TTE in 01/2020 with LVEF of 55% and G1DD. Normal stress test 01/2020. TTE 10/2023 with LVEF of 35-40%, G1DD. LHC 11/2023 showed severe 3 vessel disease and patient was referred for CABG. RHC at the same time showed normal filling pressures and CI of 2.9. CABG performed 12/2023. TTE 04/2024 with LVEF of 25-30%, G2DD, mild MR, mild-moderate TR. LHC 06/2024 with patent LIMA to LAD and occluded SVG-OM1 and SVG-RPDA. RHC 06/2024 with RA of 13, PCWP of 30, Fick CO/CI of 4.7/2.4. Underwent successful bifurcation PCI of the left circumflex/OM1 with stent placement from the proximal left circumflex into the proximal OM1 this admission.   Pertinent Lab Values: Creatinine  Date Value Ref Range Status  09/16/2014 1.35 (H) 0.60 - 1.30 mg/dL Final   Creatinine, Ser  Date Value Ref Range Status  06/25/2024 1.91 (H) 0.61 - 1.24 mg/dL Final   BUN  Date Value Ref Range Status  06/25/2024 35 (H) 8 - 23 mg/dL Final  91/70/7974 29 (H) 8 - 27 mg/dL Final  87/98/7984 22 (H) 7 - 18 mg/dL Final   Potassium  Date Value Ref Range Status  06/25/2024 4.5 3.5 - 5.1 mmol/L Final  09/16/2014 3.9 3.5 - 5.1 mmol/L Final   Sodium  Date Value Ref Range Status  06/25/2024 138 135 - 145 mmol/L Final  06/14/2024 144 134 - 144 mmol/L Final  09/16/2014 137 136 - 145 mmol/L Final   B  Natriuretic Peptide  Date Value Ref Range Status  01/15/2024 1,263.2 (H) 0.0 - 100.0 pg/mL Final    Comment:    Performed at [facility], [address] [place], [place] [address]   Magnesium   Date Value Ref Range Status  06/23/2024 2.2 1.7 - 2.4 mg/dL Final    Comment:    Performed at [facility], [address] [place], [place] [address]   Hgb A1c MFr Bld  Date Value Ref Range Status  01/02/2024 5.4 4.8 - 5.6 % Final    Comment:    (NOTE) Pre diabetes:          5.7%-6.4%  Diabetes:              >6.4%  Glycemic control for   <7.0% adults with diabetes     Vital Signs:  Temp:  [97.5 F (36.4 C)-98.3 F (36.8 C)] 97.5 F (36.4 C) (09/09 0736) Pulse Rate:  [61-72] 68 (09/09 0915) Cardiac Rhythm: Normal sinus rhythm;Bundle branch block (09/09 0700) Resp:  [11-18] 16 (09/09 0736) BP: (115-137)/(52-67) 133/62 (09/09 0915) SpO2:  [93 %-99 %] 97 % (09/09 0736) Weight:  [85.7 kg (188 lb 14.4 oz)] 85.7 kg (188 lb 14.4 oz) (09/09 0500)  Intake/Output Summary (Last 24 hours) at 06/25/2024 1022 Last data filed at 06/25/2024 0917 Gross per 24 hour  Intake 1223 ml  Output 975 ml  Net 248 ml    Current Heart Failure Medications:  Loop diuretic: furosemide  40 mg PO daily Beta-Blocker: metoprolol  succinate 25 mg daily ACEI/ARB/ARNI: Entresto  24-26 mg BID MRA: none SGLT2i: none Other: hydralazine  25 mg TID + Imdur  30 mg daily  Prior to admission Heart Failure Medications:  Loop diuretic: furosemide  40 mg prn Beta-Blocker: metoprolol  succinate 25 mg daily ACEI/ARB/ARNI: none MRA: none SGLT2i: none Other: amlodipine  5 mg daily  Assessment: 1. Acute on chronic combined systolic and diastolic heart failure (LVEF 25-30%) with G2DD, due to ICM. NYHA class symptoms not assessed given patient in procedure area. -Symptoms: Symptoms improved from admission. No longer having shortness of breath or LEE.  -Volume: Severely elevated filling pressures on RHC 06/21/24  s/p IV diuresis. Wt down 5 lbs from admission, up 2 lbs from yesterday if accurate. Renal function stable after cath. Agree with furosemide  40 mg PO daily. -Hemodynamics: BP elevated and HR 60-70s.  -BB: Continue metoprolol  succinate 25 mg daily.  -ACEI/ARB/ARNI: Creatinine is stable after cath. Agree with addition of Entresto  24-26 mg BID. -MRA: Would no start at this time given renal function. -SGLT2i: Consider adding SGLT2i.  Plan: 1) Medication changes recommended at this time: -Agree with addition of Entresto  24-26 mg BID. May be able to stop hydralazine  pending BP this afternoon. -Consider SGLT2i at first follow up appointment.  2) Patient assistance: -Copays for Entresto , Farxiga , and Jardiance are $0  3) Education: -To be completed prior to discharge.  Medication Assistance / Insurance Benefits Check: Does the patient have prescription insurance?    Type of insurance plan:  Does the patient qualify for medication assistance through manufacturers or grants? Pending   Please do not hesitate to reach out with questions or concerns,  Jaun Bash, PharmD, CPP, BCPS, [facility] Heart Failure Pharmacist  Phone - [phone] 06/25/2024 10:22 AM

## 2024-07-01 ENCOUNTER — Telehealth: Payer: Self-pay | Admitting: Cardiology

## 2024-07-01 DIAGNOSIS — Z79899 Other long term (current) drug therapy: Secondary | ICD-10-CM | POA: Diagnosis not present

## 2024-07-01 NOTE — Telephone Encounter (Signed)
 Called to confirm/remind patient of their appointment at the [facility] on 07/02/24.   Appointment:   [x] Confirmed  [] Left mess   [] No answer/No voice mail  [] VM Full/unable to leave message  [] Phone not in service  Patient reminded to bring all medications and/or complete list.  Confirmed patient has transportation. Gave directions, instructed to utilize valet parking.

## 2024-07-02 ENCOUNTER — Encounter: Admitting: Family

## 2024-07-02 ENCOUNTER — Encounter: Payer: Self-pay | Admitting: Cardiology

## 2024-07-02 ENCOUNTER — Ambulatory Visit: Attending: Cardiology | Admitting: Cardiology

## 2024-07-02 ENCOUNTER — Ambulatory Visit: Payer: Self-pay | Admitting: Internal Medicine

## 2024-07-02 ENCOUNTER — Other Ambulatory Visit: Payer: Self-pay

## 2024-07-02 VITALS — BP 126/62 | HR 74 | Wt 191.2 lb

## 2024-07-02 DIAGNOSIS — Z8673 Personal history of transient ischemic attack (TIA), and cerebral infarction without residual deficits: Secondary | ICD-10-CM | POA: Diagnosis not present

## 2024-07-02 DIAGNOSIS — I509 Heart failure, unspecified: Secondary | ICD-10-CM | POA: Diagnosis present

## 2024-07-02 DIAGNOSIS — I130 Hypertensive heart and chronic kidney disease with heart failure and stage 1 through stage 4 chronic kidney disease, or unspecified chronic kidney disease: Secondary | ICD-10-CM | POA: Insufficient documentation

## 2024-07-02 DIAGNOSIS — E785 Hyperlipidemia, unspecified: Secondary | ICD-10-CM | POA: Insufficient documentation

## 2024-07-02 DIAGNOSIS — I2510 Atherosclerotic heart disease of native coronary artery without angina pectoris: Secondary | ICD-10-CM | POA: Insufficient documentation

## 2024-07-02 DIAGNOSIS — Z7901 Long term (current) use of anticoagulants: Secondary | ICD-10-CM | POA: Diagnosis not present

## 2024-07-02 DIAGNOSIS — N183 Chronic kidney disease, stage 3 unspecified: Secondary | ICD-10-CM | POA: Insufficient documentation

## 2024-07-02 DIAGNOSIS — Z951 Presence of aortocoronary bypass graft: Secondary | ICD-10-CM | POA: Insufficient documentation

## 2024-07-02 DIAGNOSIS — I480 Paroxysmal atrial fibrillation: Secondary | ICD-10-CM | POA: Diagnosis not present

## 2024-07-02 DIAGNOSIS — Z7902 Long term (current) use of antithrombotics/antiplatelets: Secondary | ICD-10-CM | POA: Diagnosis not present

## 2024-07-02 DIAGNOSIS — Z7984 Long term (current) use of oral hypoglycemic drugs: Secondary | ICD-10-CM | POA: Diagnosis not present

## 2024-07-02 DIAGNOSIS — I5022 Chronic systolic (congestive) heart failure: Secondary | ICD-10-CM | POA: Insufficient documentation

## 2024-07-02 DIAGNOSIS — I255 Ischemic cardiomyopathy: Secondary | ICD-10-CM | POA: Diagnosis not present

## 2024-07-02 DIAGNOSIS — Z955 Presence of coronary angioplasty implant and graft: Secondary | ICD-10-CM | POA: Diagnosis not present

## 2024-07-02 DIAGNOSIS — I5020 Unspecified systolic (congestive) heart failure: Secondary | ICD-10-CM

## 2024-07-02 LAB — BASIC METABOLIC PANEL WITH GFR
BUN/Creatinine Ratio: 21 (ref 10–24)
BUN: 48 mg/dL — ABNORMAL HIGH (ref 8–27)
CO2: 18 mmol/L — ABNORMAL LOW (ref 20–29)
Calcium: 9 mg/dL (ref 8.6–10.2)
Chloride: 107 mmol/L — ABNORMAL HIGH (ref 96–106)
Creatinine, Ser: 2.28 mg/dL — ABNORMAL HIGH (ref 0.76–1.27)
Glucose: 90 mg/dL (ref 70–99)
Potassium: 4.9 mmol/L (ref 3.5–5.2)
Sodium: 143 mmol/L (ref 134–144)
eGFR: 28 mL/min/1.73 — ABNORMAL LOW (ref >59)

## 2024-07-02 MED ORDER — FUROSEMIDE 20 MG PO TABS
20.0000 mg | ORAL_TABLET | Freq: Every day | ORAL | 11 refills | Status: DC
  Filled 2024-07-02 (×2): qty 30, 30d supply, fill #0

## 2024-07-02 MED ORDER — EMPAGLIFLOZIN 10 MG PO TABS
10.0000 mg | ORAL_TABLET | Freq: Every day | ORAL | 11 refills | Status: DC
  Filled 2024-07-02 (×2): qty 30, 30d supply, fill #0

## 2024-07-02 NOTE — Patient Instructions (Addendum)
 Medication Changes:  DECREASE LASIX  TO 20 MG ONCE DAILY  START JARDIANCE  10 MG ONCE DAILY   Lab Work:  Go over to the [facility]. Go pass the gift shop and have your blood work completed in 2 weeks.  We will only call you if the results are abnormal or if the provider would like to make medication changes.  No news is good news.   Testing/Procedures:  Please have your echo completed. You will check in for this at [facility] [place]. You have to arrive 15 MINS EARLY for preparation, otherwise you will have to reschedule. They will call you to schedule this.   Referrals:  We have placed a referral today to Nephrology for your kidneys. They should reach out to you within 1-2 weeks. If they do not, please reach out to them. Their information is below on this AVS.   Follow-Up in: 2 weeks with Nason, our pharmacist. And in 1 MONTH WITH DR. ROLAN AFTER YOU COMPLETE YOUR ECHO.   Thank you for choosing [facility].    At the [facility], you and your health needs are our priority. We have a designated team specialized in the treatment of Heart Failure. This Care Team includes your primary Heart Failure Specialized Cardiologist (physician), Advanced Practice Providers (APPs- Physician Assistants and Nurse Practitioners), and Pharmacist who all work together to provide you with the care you need, when you need it.   You may see any of the following providers on your designated Care Team at your next follow up:  Dr. Toribio Fuel Dr. Ezra ROLAN Dr. Ria Commander Dr. Morene Brownie Ellouise Class, FNP Jaun Bash, RPH-CPP  Please be sure to bring in all your medications bottles to every appointment.   Need to Contact Us :  If you have any questions or concerns before your next appointment please send us  a message through [place] or call our office at [phone].    TO LEAVE A MESSAGE FOR THE  NURSE SELECT OPTION 2, PLEASE LEAVE A MESSAGE INCLUDING: YOUR NAME DATE OF BIRTH CALL BACK NUMBER REASON FOR CALL**this is important as we prioritize the call backs  YOU WILL RECEIVE A CALL BACK THE SAME DAY AS LONG AS YOU CALL BEFORE 4:00 PM

## 2024-07-02 NOTE — Progress Notes (Signed)
 PCP: Arloa Elsie SAUNDERS, MD HF Cardiology: Dr. Rolan  Chief complaint: CHF.   78 y.o. with history of CAD s/p CABG, ischemic cardiomyopathy, and CKD stage 3 was referred by Bernardino Bring PA, for evaluation of CHF. Patient noted exertional dyspnea in early 2025.  Echo in 1/25 showed EF 35-40%.  He then had a cath showing severe 3 vessel disease in 2/25.  In 3/25, he had CABG with LIMA-LAD, SVG-PDA, and SVG-OM1.  Post-op period was complicated by atrial fibrillation and a CVA. During cardiac rehab, patient noted ongoing exertional dyspnea.  Echo in 7/25 showed persistently low EF, 25-30%.  He had a cardiac PET in 8/25 showing a large, severe basal to apical reversible inferior perfusion defect.  LHC in 9/25 showed SVG-OM1 and SVG-PDA totally occluded, LIMA patent-LAD patent, 95% mid RCA and occluded PDA with left=>right collaterals, 90% proximal/mid LCx.  Patient had DES mid RCA and DES proximal LCx into OM1.   Since PCI, patient has noted improved symptoms.  He can walk for 30 minutes now with no dyspnea on flat ground.  Mild dyspnea walking up a steep hill but he does not have to stop. No chest pain. No orthopnea/PND.  No lightheadedness.    ECG (personally reviewed, 9/25): NSR, LBBB 160 msec  Labs (9/25): K 4.9, creatinine 2.28  PMH: 1. Chronic LBBB 2. Hyperlipidemia: Statin intolerant.  3. HTN 4. GERD 5. CKD stage 3 6. Chronic systolic CHF: Ischemic cardiomyopathy.   - Echo (1/25): EF 35-40% - Echo (7/25): EF 25-30%, mild MR, mild-moderate TR.  7. Atrial fibrillation: Paroxysmal. Noted post-op CABG.  - Zio (5/25): no atrial fibrillation, rare PVCs.  8. CVA: Post-op CABG.  9. CAD: LHC in 2/25 with severe 3 vessel disease.  - CABG x 3 with LIMA-LAD, SVG-PDA, SVG-OM1 in 3/25.  - Cardiac PET (8/25): large, severe basal to apical reversible inferior perfusion defect.  - LHC (9/25): SVG-OM1 and SVG-PDA totally occluded, LIMA patent-LAD patent, 95% mid RCA and occluded PDA with left=>right  collaterals, 90% proximal/mid LCx.  Patient had DES mid RCA and DES proximal LCx into OM1.   FH: Strong history of CAD. Father with CAD, CHF; brother with sudden death; sister with CAD.   SH: Married, lives in [place], no smoking/ETOH.    ROS: All systems reviewed and negative except as per HPI.   Current Outpatient Medications  Medication Sig Dispense Refill   apixaban  (ELIQUIS ) 5 MG TABS tablet Take 1 tablet (5 mg total) by mouth 2 (two) times daily. 180 tablet 3   cholecalciferol  (VITAMIN D3) 25 MCG (1000 UNIT) tablet Take 1 tablet (1,000 Units total) by mouth daily. (Patient taking differently: Take 1,000 Units by mouth every evening.) 30 tablet 0   clopidogrel  (PLAVIX ) 75 MG tablet Take 1 tablet (75 mg total) by mouth daily with breakfast. 30 tablet 0   empagliflozin  (JARDIANCE ) 10 MG TABS tablet Take 1 tablet (10 mg total) by mouth daily before breakfast. 30 tablet 11   Evolocumab  (REPATHA  SURECLICK) 140 MG/ML SOAJ Inject 140 mg into the skin every 14 (fourteen) days. 2 mL 2   ezetimibe  (ZETIA ) 10 MG tablet Take 1 tablet (10 mg total) by mouth daily. 90 tablet 3   famotidine  (PEPCID ) 20 MG tablet Take 1 tablet (20 mg total) by mouth every evening. (Patient taking differently: Take 20 mg by mouth every 3 (three) days. Taking in the evening) 30 tablet 0   furosemide  (LASIX ) 20 MG tablet Take 1 tablet (20 mg total) by mouth daily.  30 tablet 11   hydrALAZINE  (APRESOLINE ) 25 MG tablet Take 1 tablet (25 mg total) by mouth every 8 (eight) hours. 90 tablet 0   isosorbide  mononitrate (IMDUR ) 30 MG 24 hr tablet Take 1 tablet (30 mg total) by mouth daily. 30 tablet 0   levothyroxine  (SYNTHROID ) 75 MCG tablet Take 75 mcg by mouth every morning.     MAGNESIUM  PO Take 1 tablet by mouth at bedtime.     metoprolol  succinate (TOPROL -XL) 25 MG 24 hr tablet Take 1 tablet (25 mg total) by mouth daily. Take with or immediately following a meal. 90 tablet 3   Multiple Vitamins-Minerals (PRESERVISION AREDS 2)  CAPS Take 1 capsule by mouth in the morning and at bedtime.     nitroGLYCERIN  (NITROSTAT ) 0.4 MG SL tablet Place 1 tablet (0.4 mg total) under the tongue every 5 (five) minutes as needed for chest pain. 25 tablet 0   Omega-3 Fatty Acids (FISH OIL) 1000 MG CAPS Take 1,000 mg by mouth daily.     sacubitril -valsartan  (ENTRESTO ) 24-26 MG Take 1 tablet by mouth 2 (two) times daily. 60 tablet 0   No current facility-administered medications for this visit.   BP 126/62   Pulse 74   Wt 191 lb 3.2 oz (86.7 kg)   SpO2 99%   BMI 29.50 kg/m  General: NAD Neck: No JVD, no thyromegaly or thyroid  nodule.  Lungs: Clear to auscultation bilaterally with normal respiratory effort. CV: Nondisplaced PMI.  Heart regular S1/S2, no S3/S4, no murmur.  No peripheral edema.  No carotid bruit.  Normal pedal pulses.  Abdomen: Soft, nontender, no hepatosplenomegaly, no distention.  Skin: Intact without lesions or rashes.  Neurologic: Alert and oriented x 3.  Psych: Normal affect. Extremities: No clubbing or cyanosis.  HEENT: Normal.   Assessment/Plan: 1. CAD: CABG in 3/25 with LIMA-LAD, SVG-PDA, and SVG-OM1.  With ongoing exertional symptoms, cardiac PET in 8/25 was done showing reversible inferior defect.  LHC in 9/25 showed SVG-OM1 and SVG-PDA totally occluded, LIMA patent-LAD patent, 95% mid RCA and occluded PDA with left=>right collaterals, 90% proximal/mid LCx.  Patient had DES mid RCA and DES proximal LCx into OM1. Doing better now, no significant exertional symptoms.  - Continue Plavix , no ASA given apixaban  use.  - Continue Repatha  and Zetia .   2. CKD stage 3: Creatinine 2.28 on recent BMET, this is baseline.  - I will refer to nephrology.  - Start Jardiance  10 mg daily with BMET in 10 days.  3. Chronic systolic CHF: Ischemic cardiomyopathy.  Echo in 7/25 showed EF 25-30%, mild MR, mild-moderate TR. He is not volume overloaded on exam, NYHA class II symptoms.  - Repeat echo in 3 months post-PCI to assess  need for ICD.  He has chronic wide LBBB, so would get CRT-D.  - Add Jardiance  10 mg daily as above.  - Decrease Lasix  to 20 mg daily with addition of Jardiance .  - Continue Toprol  XL 25 mg daily.  - Continue Entresto  24/26 bid.  - For now, continue hydralazine  25 tid and Imdur  30 mg daily.  If creatinine remains stable with addition of Jardiance , next step will be stopping hydralazine /Imdur  and increasing Entresto .  - Would like to eventually add spironolactone as well if creatinine remains stable.  4. Atrial fibrillation: Paroxysmal.  He is in NSR.  - Continue apixaban .  5. H/o CVA: Post-op CABG.  6. Hyperlipidemia: He is intolerant of statins, now on Zetia  and Repatha .  - With strong FH of CAD, will check  Lp(a).   Followup in 2 wks with HF pharmacist to try to get him off hydralazine /Imdur  and onto higher dose Entresto .  Followup in 1 month with me.   I spent 56 minutes reviewing records, interviewing/examining patient, and managing orders.   Ezra Shuck 07/02/2024

## 2024-07-03 ENCOUNTER — Telehealth: Payer: Self-pay | Admitting: Internal Medicine

## 2024-07-03 NOTE — Telephone Encounter (Signed)
   Pre-operative Risk Assessment    Patient Name: Tyler David  DOB: 01/09/1946 MRN: [id number]   Date of last office visit: 06/14/24 Date of next office visit: 07/09/24  Request for Surgical Clearance    Procedure:  cleaning & possible fillings and crowns  Date of Surgery:  Clearance TBD                                Surgeon:  Himi Saxena, DDS, PA Surgeon's Group or Practice Name:  [facility] Phone number:  [phone] Fax number:  [phone]   Type of Clearance Requested:   - Medical    Type of Anesthesia:  Not Indicated   Additional requests/questions:    Signed, Arsenio Celine GAILS   07/03/2024, 8:34 AM

## 2024-07-03 NOTE — Telephone Encounter (Signed)
    Primary Cardiologist: Lonni Hanson, MD  Chart reviewed as part of pre-operative protocol coverage. Simple dental extractions, cleanings, fillings, and crowns are considered low risk procedures per guidelines and generally do not require any specific cardiac clearance. It is also generally accepted that for simple extractions and dental cleanings, there is no need to interrupt blood thinner therapy.   SBE prophylaxis is not required for the patient.  I will route this recommendation to the requesting party via Epic fax function and remove from pre-op pool.  Please call with questions.  Klarissa Mcilvain E Panda Crossin, NP 07/03/2024, 10:34 AM

## 2024-07-09 ENCOUNTER — Ambulatory Visit: Admitting: Physician Assistant

## 2024-07-11 ENCOUNTER — Telehealth: Payer: Self-pay

## 2024-07-11 NOTE — Telephone Encounter (Signed)
 He can stop hydralazine  and Imdur .  He needs to continue his other medications.

## 2024-07-11 NOTE — Telephone Encounter (Signed)
 Pt called stating that he is feeling dizzy and swimmy headed. He suspects that it is being caused by either his imdur  or hydralazine . He would like to stop those medications. His bp today was 124/57, weight stable. He denies shortness of breath, swelling or chest pain.

## 2024-07-12 NOTE — Telephone Encounter (Signed)
 Spoke with pt. Pt agreeable to stopping medication. No further questions at this time.

## 2024-07-16 ENCOUNTER — Other Ambulatory Visit: Payer: Self-pay

## 2024-07-16 ENCOUNTER — Other Ambulatory Visit
Admission: RE | Admit: 2024-07-16 | Discharge: 2024-07-16 | Disposition: A | Discharge status: Home / Self Care | Attending: Cardiology | Admitting: Cardiology

## 2024-07-16 ENCOUNTER — Ambulatory Visit (HOSPITAL_COMMUNITY): Payer: Self-pay | Admitting: Cardiology

## 2024-07-16 DIAGNOSIS — I5020 Unspecified systolic (congestive) heart failure: Secondary | ICD-10-CM | POA: Insufficient documentation

## 2024-07-16 LAB — BASIC METABOLIC PANEL WITH GFR
Anion gap: 6 (ref 5–15)
BUN: 42 mg/dL — ABNORMAL HIGH (ref 8–23)
CO2: 26 mmol/L (ref 22–32)
Calcium: 8.5 mg/dL — ABNORMAL LOW (ref 8.9–10.3)
Chloride: 110 mmol/L (ref 98–111)
Creatinine, Ser: 2.14 mg/dL — ABNORMAL HIGH (ref 0.61–1.24)
GFR, Estimated: 31 mL/min — ABNORMAL LOW (ref >60)
Glucose, Bld: 92 mg/dL (ref 70–99)
Potassium: 4.5 mmol/L (ref 3.5–5.1)
Sodium: 142 mmol/L (ref 135–145)

## 2024-07-17 ENCOUNTER — Telehealth: Payer: Self-pay | Admitting: Pharmacist

## 2024-07-17 NOTE — Telephone Encounter (Signed)
 Called to confirm/remind patient of their appointment at the [facility] on 07/18/24.   Appointment:   [x] Confirmed  [] Left mess   [] No answer/No voice mail  [] VM Full/unable to leave message  [] Phone not in service  Patient reminded to bring all medications and/or complete list.  Confirmed patient has transportation. Gave directions, instructed to utilize valet parking.

## 2024-07-18 ENCOUNTER — Other Ambulatory Visit: Payer: Self-pay

## 2024-07-18 ENCOUNTER — Ambulatory Visit: Attending: Cardiology | Admitting: Pharmacist

## 2024-07-18 ENCOUNTER — Other Ambulatory Visit (HOSPITAL_COMMUNITY): Payer: Self-pay

## 2024-07-18 VITALS — BP 132/66 | HR 64 | Wt 192.8 lb

## 2024-07-18 DIAGNOSIS — Z7984 Long term (current) use of oral hypoglycemic drugs: Secondary | ICD-10-CM | POA: Insufficient documentation

## 2024-07-18 DIAGNOSIS — I447 Left bundle-branch block, unspecified: Secondary | ICD-10-CM | POA: Insufficient documentation

## 2024-07-18 DIAGNOSIS — I5022 Chronic systolic (congestive) heart failure: Secondary | ICD-10-CM | POA: Insufficient documentation

## 2024-07-18 DIAGNOSIS — Z951 Presence of aortocoronary bypass graft: Secondary | ICD-10-CM | POA: Insufficient documentation

## 2024-07-18 DIAGNOSIS — I480 Paroxysmal atrial fibrillation: Secondary | ICD-10-CM | POA: Insufficient documentation

## 2024-07-18 DIAGNOSIS — Z7901 Long term (current) use of anticoagulants: Secondary | ICD-10-CM | POA: Diagnosis not present

## 2024-07-18 DIAGNOSIS — Z7902 Long term (current) use of antithrombotics/antiplatelets: Secondary | ICD-10-CM | POA: Insufficient documentation

## 2024-07-18 DIAGNOSIS — N183 Chronic kidney disease, stage 3 unspecified: Secondary | ICD-10-CM | POA: Insufficient documentation

## 2024-07-18 DIAGNOSIS — I5020 Unspecified systolic (congestive) heart failure: Secondary | ICD-10-CM

## 2024-07-18 DIAGNOSIS — Z79899 Other long term (current) drug therapy: Secondary | ICD-10-CM | POA: Insufficient documentation

## 2024-07-18 DIAGNOSIS — I2510 Atherosclerotic heart disease of native coronary artery without angina pectoris: Secondary | ICD-10-CM | POA: Diagnosis not present

## 2024-07-18 DIAGNOSIS — Z8673 Personal history of transient ischemic attack (TIA), and cerebral infarction without residual deficits: Secondary | ICD-10-CM | POA: Diagnosis not present

## 2024-07-18 DIAGNOSIS — E785 Hyperlipidemia, unspecified: Secondary | ICD-10-CM | POA: Insufficient documentation

## 2024-07-18 DIAGNOSIS — I255 Ischemic cardiomyopathy: Secondary | ICD-10-CM | POA: Diagnosis not present

## 2024-07-18 MED ORDER — EMPAGLIFLOZIN 10 MG PO TABS
10.0000 mg | ORAL_TABLET | Freq: Every day | ORAL | 3 refills | Status: AC
  Filled 2024-07-18 – 2024-07-25 (×4): qty 90, 90d supply, fill #0
  Filled 2024-10-22: qty 90, 90d supply, fill #1

## 2024-07-18 MED ORDER — CLOPIDOGREL BISULFATE 75 MG PO TABS
75.0000 mg | ORAL_TABLET | Freq: Every day | ORAL | 0 refills | Status: DC
  Filled 2024-07-18: qty 90, 90d supply, fill #0

## 2024-07-18 MED ORDER — SACUBITRIL-VALSARTAN 49-51 MG PO TABS
1.0000 | ORAL_TABLET | Freq: Two times a day (BID) | ORAL | 1 refills | Status: DC
  Filled 2024-07-18 (×3): qty 180, 90d supply, fill #0
  Filled ????-??-?? (×2): fill #0

## 2024-07-18 NOTE — Progress Notes (Signed)
 Advanced [facility] Failure Clinic Note  PCP: Arloa Elsie SAUNDERS, MD PCP-Cardiologist: Lonni Hanson, MD HF-Cardiologist: Ezra Shuck, MD  HPI:  78 y.o. with history of CAD s/p CABG, ischemic cardiomyopathy, and CKD stage 3 was referred by Bernardino Bring PA, for evaluation of CHF. Patient noted exertional dyspnea in early 2025.  Echo in 10/2023 showed EF 35-40%.  He then had a cath showing severe 3 vessel disease in 11/2023.  In 3/25, he had CABG with LIMA-LAD, SVG-PDA, and SVG-OM1.  Post-op period was complicated by atrial fibrillation and a CVA. During cardiac rehab, patient noted ongoing exertional dyspnea.  Echo in 04/2024 showed persistently low EF, 25-30%.  He had a cardiac PET in 05/2024 showing a large, severe basal to apical reversible inferior perfusion defect.  LHC in 06/2024 showed SVG-OM1 and SVG-PDA totally occluded, LIMA patent-LAD patent, 95% mid RCA and occluded PDA with left=>right collaterals, 90% proximal/mid LCx.  Patient had DES mid RCA and DES proximal LCx into OM1. RHC 06/2024 with RA of 13, PCWP of 30, Fick CO/CI of 4.7/2.4.    Today Tyler David returns to Heart Failure Clinic for pharmacist medication titration. Reports feeling much better since stopping hydralazine /Imdur . Had been experiencing fatigue and dizziness, despite BP being normal. Reports now only occasional dizziness and slowly improving fatigue. Denies chest pain, palpitations, SOB, LEE, orthopnea, PND. Reports being able to complete all activities of daily living (ADLs). Is not very active throughout the day. Weight at home is 185-187 pounds. Takes furosemide  20 mg daily, recently decreased. Appetite is good. Somewhat follows a low sodium diet.  Current Heart Failure Medications: Loop diuretic: furosemide  20 mg daily Beta-Blocker: metoprolol  succinate 25 mg daily ACEI/ARB/ARNI: Entresto  24-26 mg BID MRA: none SGLT2i: Jardiance  10 mg daily Other: none  Has the patient been experiencing any side effects to the  medications prescribed? Yes. Patient reported dizziness and fatigue with hydralazine /Imdur .  Does the patient have any problems obtaining medications due to transportation or finances? No  Understanding of regimen: Good  Understanding of indications: Good  Potential of adherence: Good  Patient understands to avoid NSAIDs.  Patient understands to avoid decongestants.  Pertinent Lab Values: Creatinine  Date Value Ref Range Status  09/16/2014 1.35 (H) 0.60 - 1.30 mg/dL Final   Creatinine, Ser  Date Value Ref Range Status  07/16/2024 2.14 (H) 0.61 - 1.24 mg/dL Final   BUN  Date Value Ref Range Status  07/16/2024 42 (H) 8 - 23 mg/dL Final  90/84/7974 48 (H) 8 - 27 mg/dL Final  87/98/7984 22 (H) 7 - 18 mg/dL Final   Potassium  Date Value Ref Range Status  07/16/2024 4.5 3.5 - 5.1 mmol/L Final  09/16/2014 3.9 3.5 - 5.1 mmol/L Final   Sodium  Date Value Ref Range Status  07/16/2024 142 135 - 145 mmol/L Final  07/01/2024 143 134 - 144 mmol/L Final  09/16/2014 137 136 - 145 mmol/L Final   B Natriuretic Peptide  Date Value Ref Range Status  01/15/2024 1,263.2 (H) 0.0 - 100.0 pg/mL Final    Comment:    Performed at [facility], [address], [place], [place] [address]   Magnesium   Date Value Ref Range Status  06/23/2024 2.2 1.7 - 2.4 mg/dL Final    Comment:    Performed at [facility], [address] [place] [place], [place] [address]    Vital Signs: Today's Vitals   07/18/24 1431  BP: 132/66  Pulse: 64  SpO2: 98%  Weight: 192 lb  12.8 oz (87.5 kg)    Assessment/Plan: 1. CAD: CABG in 3/25 with LIMA-LAD, SVG-PDA, and SVG-OM1.  With ongoing exertional symptoms, cardiac PET in 05/2024 was done showing reversible inferior defect.  LHC in 06/2024 showed SVG-OM1 and SVG-PDA totally occluded, LIMA patent-LAD patent, 95% mid RCA and occluded PDA with left=>right collaterals, 90% proximal/mid LCx.  Patient had DES mid RCA and DES proximal LCx into OM1. Doing better  now, no significant exertional symptoms.  - Continue Plavix , no ASA given apixaban  use.  - Continue Repatha  and Zetia .   2. CKD stage 3: Creatinine 2.14 on recent BMET, this is at baseline.  - Referred to nephrology.  - Continue Jardiance  10 mg daily, BMET stable after initiation. 3. Chronic systolic CHF: Ischemic cardiomyopathy.  Echo in 04/2024 showed EF 25-30%, mild MR, mild-moderate TR. He is not volume overloaded on exam, NYHA class II symptoms.  - Repeat echo in 3 months post-PCI to assess need for ICD.  He has chronic wide LBBB, so would need CRT-D.  - Continue Jardiance  10 mg daily   - Continue Lasix  to 20 mg daily. Instructed to monitor symptoms after Entresto  initiation, may require further dose reduction. - Continue Toprol  XL 25 mg daily.  - Increase Entresto  to 49-51 mg bid. BMET in 1 week. - Stop hydralazine  25 tid and Imdur  30 mg daily.  - May consider MRA if CKD remains stable. 4. Atrial fibrillation: Paroxysmal.  He is in NSR.  - Continue apixaban .  5. H/o CVA: Post-op CABG.  6. Hyperlipidemia: He is intolerant of statins, now on Zetia  and Repatha .  - With strong FH of CAD, will check Lp(a).   Follow up: Pharmacist in 1 week, Dr. Rolan in 3 weeks  Please do not hesitate to reach out with questions or concerns,  Jaun Bash, PharmD, CPP, BCPS, [facility] Heart Failure Pharmacist  Phone - [phone] 07/18/2024 3:25 PM

## 2024-07-18 NOTE — Patient Instructions (Addendum)
 It was a pleasure seeing you today!  MEDICATIONS: -Increase Entresto  to 49-51 mg 1 tablet twice daily -Call if you have questions about your medications.  LABS: -We will call you if your labs need attention.  NEXT APPOINTMENT: Return to clinic in 1 week to see Eliyas Suddreth.  In general, to take care of your heart failure: -Limit your fluid intake to 2 Liters (half-gallon) per day.   -Limit your salt intake to ideally 2-3 grams (2000-3000 mg) per day. -Weigh yourself daily and record, and bring that weight diary to your next appointment.  (Weight gain of 2-3 pounds in 1 day typically means fluid weight.) -The medications for your heart are to help your heart and help you live longer.   -Please contact us  before stopping any of your heart medications.  Call the clinic at [phone] with questions or to reschedule future appointments.

## 2024-07-22 NOTE — Progress Notes (Unsigned)
 Advanced Heart Failure Clinic Note  PCP: Arloa Elsie SAUNDERS, MD PCP-Cardiologist: Lonni Hanson, MD HF-Cardiologist: Ezra Shuck, MD  HPI:  78 y.o. with history of CAD s/p CABG, ischemic cardiomyopathy, and CKD stage 3 was referred by Bernardino Bring PA, for evaluation of CHF. Patient noted exertional dyspnea in early 2025.  Echo in 10/2023 showed EF 35-40%.  He then had a cath showing severe 3 vessel disease in 11/2023.  In 3/25, he had CABG with LIMA-LAD, SVG-PDA, and SVG-OM1.  Post-op period was complicated by atrial fibrillation and a CVA. During cardiac rehab, patient noted ongoing exertional dyspnea.  Echo in 04/2024 showed persistently low EF, 25-30%.  He had a cardiac PET in 05/2024 showing a large, severe basal to apical reversible inferior perfusion defect.  LHC in 06/2024 showed SVG-OM1 and SVG-PDA totally occluded, LIMA patent-LAD patent, 95% mid RCA and occluded PDA with left=>right collaterals, 90% proximal/mid LCx.  Patient had DES mid RCA and DES proximal LCx into OM1. RHC 06/2024 with RA of 13, PCWP of 30, Fick CO/CI of 4.7/2.4.    Today Tyler David returns to Heart Failure Clinic for pharmacist medication titration. Reports feeling much better since stopping hydralazine /Imdur . Had been experiencing fatigue and dizziness, despite BP being normal. Reports now only occasional dizziness and slowly improving fatigue. Denies chest pain, palpitations, SOB, LEE, orthopnea, PND. Reports being able to complete all activities of daily living (ADLs). Is not very active throughout the day. Weight at home is 185-187 pounds. Takes furosemide  20 mg daily, recently decreased. Appetite is good. Somewhat follows a low sodium diet.  Current Heart Failure Medications: Loop diuretic: furosemide  20 mg daily Beta-Blocker: metoprolol  succinate 25 mg daily ACEI/ARB/ARNI: Entresto  24-26 mg BID MRA: none SGLT2i: Jardiance  10 mg daily Other: none  Has the patient been experiencing any side effects to the  medications prescribed? Yes. Patient reported dizziness and fatigue with hydralazine /Imdur .  Does the patient have any problems obtaining medications due to transportation or finances? No  Understanding of regimen: Good  Understanding of indications: Good  Potential of adherence: Good  Patient understands to avoid NSAIDs.  Patient understands to avoid decongestants.  Pertinent Lab Values: Creatinine  Date Value Ref Range Status  09/16/2014 1.35 (H) 0.60 - 1.30 mg/dL Final   Creatinine, Ser  Date Value Ref Range Status  07/16/2024 2.14 (H) 0.61 - 1.24 mg/dL Final   BUN  Date Value Ref Range Status  07/16/2024 42 (H) 8 - 23 mg/dL Final  90/84/7974 48 (H) 8 - 27 mg/dL Final  87/98/7984 22 (H) 7 - 18 mg/dL Final   Potassium  Date Value Ref Range Status  07/16/2024 4.5 3.5 - 5.1 mmol/L Final  09/16/2014 3.9 3.5 - 5.1 mmol/L Final   Sodium  Date Value Ref Range Status  07/16/2024 142 135 - 145 mmol/L Final  07/01/2024 143 134 - 144 mmol/L Final  09/16/2014 137 136 - 145 mmol/L Final   B Natriuretic Peptide  Date Value Ref Range Status  01/15/2024 1,263.2 (H) 0.0 - 100.0 pg/mL Final    Comment:    Performed at [facility], [address] [place], [place] [address]   Magnesium   Date Value Ref Range Status  06/23/2024 2.2 1.7 - 2.4 mg/dL Final    Comment:    Performed at [facility], [address] [place] [place], [place] [address]    Vital Signs: There were no vitals filed for this visit.   Assessment/Plan: 1. CAD: CABG in 3/25 with LIMA-LAD, SVG-PDA,  and SVG-OM1.  With ongoing exertional symptoms, cardiac PET in 05/2024 was done showing reversible inferior defect.  LHC in 06/2024 showed SVG-OM1 and SVG-PDA totally occluded, LIMA patent-LAD patent, 95% mid RCA and occluded PDA with left=>right collaterals, 90% proximal/mid LCx.  Patient had DES mid RCA and DES proximal LCx into OM1. Doing better now, no significant exertional symptoms.  - Continue Plavix ,  no ASA given apixaban  use.  - Continue Repatha  and Zetia .   2. CKD stage 3: Creatinine 2.14 on recent BMET, this is at baseline.  - Referred to nephrology.  - Continue Jardiance  10 mg daily, BMET stable after initiation. 3. Chronic systolic CHF: Ischemic cardiomyopathy.  Echo in 04/2024 showed EF 25-30%, mild MR, mild-moderate TR. He is not volume overloaded on exam, NYHA class II symptoms.  - Repeat echo in 3 months post-PCI to assess need for ICD.  He has chronic wide LBBB, so would need CRT-D.  - Continue Jardiance  10 mg daily   - Continue Lasix  to 20 mg daily. Instructed to monitor symptoms after Entresto  initiation, may require further dose reduction. - Continue Toprol  XL 25 mg daily.  - Increase Entresto  to 49-51 mg bid. BMET in 1 week. - Stop hydralazine  25 tid and Imdur  30 mg daily.  - May consider MRA if CKD remains stable. 4. Atrial fibrillation: Paroxysmal.  He is in NSR.  - Continue apixaban .  5. H/o CVA: Post-op CABG.  6. Hyperlipidemia: He is intolerant of statins, now on Zetia  and Repatha .  - With strong FH of CAD, will check Lp(a).   Follow up: Pharmacist in 1 week, Dr. Rolan in 3 weeks  Please do not hesitate to reach out with questions or concerns,  Jaun Bash, PharmD, CPP, BCPS, [facility] Heart Failure Pharmacist  Phone - [phone] 07/22/2024 7:23 AM

## 2024-07-25 ENCOUNTER — Telehealth: Payer: Self-pay | Admitting: Pharmacist

## 2024-07-25 ENCOUNTER — Other Ambulatory Visit: Payer: Self-pay

## 2024-07-25 NOTE — Telephone Encounter (Signed)
 Called to confirm/remind patient of their appointment at the [facility] on 07/26/24.   Appointment:   [x] Confirmed  [] Left mess   [] No answer/No voice mail  [] VM Full/unable to leave message  [] Phone not in service  Patient reminded to bring all medications and/or complete list.  Confirmed patient has transportation. Gave directions, instructed to utilize valet parking.

## 2024-07-26 ENCOUNTER — Other Ambulatory Visit
Admission: RE | Admit: 2024-07-26 | Discharge: 2024-07-26 | Disposition: A | Discharge status: Home / Self Care | Attending: Cardiology | Admitting: Cardiology

## 2024-07-26 ENCOUNTER — Ambulatory Visit (HOSPITAL_COMMUNITY): Payer: Self-pay | Admitting: Cardiology

## 2024-07-26 ENCOUNTER — Ambulatory Visit: Admitting: Pharmacist

## 2024-07-26 VITALS — BP 136/72 | HR 61 | Wt 192.4 lb

## 2024-07-26 DIAGNOSIS — I5020 Unspecified systolic (congestive) heart failure: Secondary | ICD-10-CM

## 2024-07-26 LAB — BASIC METABOLIC PANEL WITH GFR
Anion gap: 14 (ref 5–15)
BUN: 46 mg/dL — ABNORMAL HIGH (ref 8–23)
CO2: 22 mmol/L (ref 22–32)
Calcium: 8.4 mg/dL — ABNORMAL LOW (ref 8.9–10.3)
Chloride: 108 mmol/L (ref 98–111)
Creatinine, Ser: 2.38 mg/dL — ABNORMAL HIGH (ref 0.61–1.24)
GFR, Estimated: 27 mL/min — ABNORMAL LOW (ref >60)
Glucose, Bld: 96 mg/dL (ref 70–99)
Potassium: 4.5 mmol/L (ref 3.5–5.1)
Sodium: 144 mmol/L (ref 135–145)

## 2024-07-30 ENCOUNTER — Encounter: Admitting: Cardiology

## 2024-08-05 DIAGNOSIS — E039 Hypothyroidism, unspecified: Secondary | ICD-10-CM | POA: Diagnosis not present

## 2024-08-26 ENCOUNTER — Telehealth: Payer: Self-pay | Admitting: Cardiology

## 2024-08-26 NOTE — Telephone Encounter (Signed)
 Called to confirm/remind patient of their appointment at the [facility] on 08/27/24.   Appointment:   [x] Confirmed  [] Left mess   [] No answer/No voice mail  [] VM Full/unable to leave message  [] Phone not in service  Patient reminded to bring all medications and/or complete list.  Confirmed patient has transportation. Gave directions, instructed to utilize valet parking.

## 2024-08-27 ENCOUNTER — Ambulatory Visit: Attending: Cardiology | Admitting: Cardiology

## 2024-08-27 ENCOUNTER — Other Ambulatory Visit: Payer: Self-pay

## 2024-08-27 VITALS — BP 138/68 | HR 62 | Wt 192.4 lb

## 2024-08-27 DIAGNOSIS — I255 Ischemic cardiomyopathy: Secondary | ICD-10-CM | POA: Diagnosis not present

## 2024-08-27 DIAGNOSIS — Z79899 Other long term (current) drug therapy: Secondary | ICD-10-CM | POA: Insufficient documentation

## 2024-08-27 DIAGNOSIS — I5022 Chronic systolic (congestive) heart failure: Secondary | ICD-10-CM | POA: Insufficient documentation

## 2024-08-27 DIAGNOSIS — Z8673 Personal history of transient ischemic attack (TIA), and cerebral infarction without residual deficits: Secondary | ICD-10-CM | POA: Diagnosis not present

## 2024-08-27 DIAGNOSIS — I480 Paroxysmal atrial fibrillation: Secondary | ICD-10-CM | POA: Insufficient documentation

## 2024-08-27 DIAGNOSIS — Z951 Presence of aortocoronary bypass graft: Secondary | ICD-10-CM | POA: Insufficient documentation

## 2024-08-27 DIAGNOSIS — Z7902 Long term (current) use of antithrombotics/antiplatelets: Secondary | ICD-10-CM | POA: Insufficient documentation

## 2024-08-27 DIAGNOSIS — I5020 Unspecified systolic (congestive) heart failure: Secondary | ICD-10-CM | POA: Diagnosis not present

## 2024-08-27 DIAGNOSIS — I2510 Atherosclerotic heart disease of native coronary artery without angina pectoris: Secondary | ICD-10-CM | POA: Diagnosis not present

## 2024-08-27 DIAGNOSIS — E785 Hyperlipidemia, unspecified: Secondary | ICD-10-CM | POA: Insufficient documentation

## 2024-08-27 DIAGNOSIS — Z955 Presence of coronary angioplasty implant and graft: Secondary | ICD-10-CM | POA: Insufficient documentation

## 2024-08-27 DIAGNOSIS — I447 Left bundle-branch block, unspecified: Secondary | ICD-10-CM | POA: Diagnosis not present

## 2024-08-27 DIAGNOSIS — Z7901 Long term (current) use of anticoagulants: Secondary | ICD-10-CM | POA: Diagnosis not present

## 2024-08-27 DIAGNOSIS — N183 Chronic kidney disease, stage 3 unspecified: Secondary | ICD-10-CM | POA: Diagnosis not present

## 2024-08-27 DIAGNOSIS — Z7984 Long term (current) use of oral hypoglycemic drugs: Secondary | ICD-10-CM | POA: Diagnosis not present

## 2024-08-27 MED ORDER — CLOPIDOGREL BISULFATE 75 MG PO TABS
75.0000 mg | ORAL_TABLET | Freq: Every day | ORAL | 3 refills | Status: DC
  Filled 2024-08-27: qty 90, 90d supply, fill #0

## 2024-08-27 MED ORDER — LEVOTHYROXINE SODIUM 75 MCG PO TABS
75.0000 ug | ORAL_TABLET | Freq: Every morning | ORAL | 3 refills | Status: DC
  Filled 2024-08-27: qty 90, 90d supply, fill #0

## 2024-08-27 MED ORDER — FUROSEMIDE 20 MG PO TABS
20.0000 mg | ORAL_TABLET | ORAL | 11 refills | Status: DC
  Filled 2024-08-27: qty 15, 30d supply, fill #0

## 2024-08-27 MED ORDER — CLOPIDOGREL BISULFATE 75 MG PO TABS
75.0000 mg | ORAL_TABLET | Freq: Every day | ORAL | 0 refills | Status: DC
  Filled 2024-08-27: qty 90, 90d supply, fill #0

## 2024-08-27 MED ORDER — LEVOTHYROXINE SODIUM 75 MCG PO TABS
75.0000 ug | ORAL_TABLET | Freq: Every morning | ORAL | 1 refills | Status: AC
  Filled 2024-08-27 – 2024-09-07 (×3): qty 90, 90d supply, fill #0

## 2024-08-27 MED ORDER — FUROSEMIDE 20 MG PO TABS
20.0000 mg | ORAL_TABLET | ORAL | 3 refills | Status: AC
  Filled 2024-09-07: qty 45, 90d supply, fill #0

## 2024-08-27 MED ORDER — SACUBITRIL-VALSARTAN 49-51 MG PO TABS
1.0000 | ORAL_TABLET | Freq: Two times a day (BID) | ORAL | 1 refills | Status: DC
  Filled 2024-08-27: qty 180, 90d supply, fill #0

## 2024-08-27 MED ORDER — METOPROLOL SUCCINATE ER 50 MG PO TB24
50.0000 mg | ORAL_TABLET | Freq: Every day | ORAL | 3 refills | Status: DC
  Filled 2024-08-27: qty 90, 90d supply, fill #0

## 2024-08-27 MED ORDER — METOPROLOL SUCCINATE ER 50 MG PO TB24
50.0000 mg | ORAL_TABLET | Freq: Every day | ORAL | 3 refills | Status: AC
  Filled 2024-09-07: qty 90, 90d supply, fill #0

## 2024-08-27 MED ORDER — FAMOTIDINE 20 MG PO TABS
20.0000 mg | ORAL_TABLET | Freq: Every evening | ORAL | 0 refills | Status: AC
  Filled 2024-08-27 – 2024-09-07 (×3): qty 90, 90d supply, fill #0

## 2024-08-27 MED ORDER — SACUBITRIL-VALSARTAN 49-51 MG PO TABS
1.0000 | ORAL_TABLET | Freq: Two times a day (BID) | ORAL | 3 refills | Status: AC
  Filled 2024-08-27: qty 180, 90d supply, fill #0

## 2024-08-27 MED ORDER — FAMOTIDINE 20 MG PO TABS
20.0000 mg | ORAL_TABLET | Freq: Every evening | ORAL | 0 refills | Status: DC
  Filled 2024-08-27: qty 30, 30d supply, fill #0

## 2024-08-27 NOTE — Patient Instructions (Addendum)
 Medication Changes:  INCREASE Metoprolol  to 50mg  daily  DECREASE Furosemide  to 20mg  every other day  Lab Work:   Go downstairs to [facility] on [address] to have your blood work completed.  We will only call you if the results are abnormal or if the provider would like to make medication changes.  No news is good news.   Referrals:  You have been referred to Nephrology. They will contact you in order to schedule an appointment.    Follow-Up in: Please follow up with the Advanced [facility] Failure Clinic in 6 weeks with Ellouise Class, FNP.   Thank you for choosing [facility].    At the Advanced [facility] Failure Clinic, you and your health needs are our priority. We have a designated team specialized in the treatment of Heart Failure. This Care Team includes your primary Heart Failure Specialized Cardiologist (physician), Advanced Practice Providers (APPs- Physician Assistants and Nurse Practitioners), and Pharmacist who all work together to provide you with the care you need, when you need it.   You may see any of the following providers on your designated Care Team at your next follow up:  Dr. Toribio Fuel Dr. Ezra Shuck Dr. Ria Commander Dr. Morene Brownie Ellouise Class, FNP Jaun Bash, RPH-CPP  Please be sure to bring in all your medications bottles to every appointment.   Need to Contact Us :  If you have any questions or concerns before your next appointment please send us  a message through [place] or call our office at [phone].    TO LEAVE A MESSAGE FOR THE NURSE SELECT OPTION 2, PLEASE LEAVE A MESSAGE INCLUDING: YOUR NAME DATE OF BIRTH CALL BACK NUMBER REASON FOR CALL**this is important as we prioritize the call backs  YOU WILL RECEIVE A CALL BACK THE SAME DAY AS LONG AS YOU CALL BEFORE 4:00 PM

## 2024-08-28 ENCOUNTER — Ambulatory Visit (HOSPITAL_COMMUNITY): Payer: Self-pay | Admitting: Cardiology

## 2024-08-28 ENCOUNTER — Other Ambulatory Visit: Payer: Self-pay

## 2024-08-28 LAB — LIPID PANEL
Chol/HDL Ratio: 2.4 ratio (ref 0.0–5.0)
Cholesterol, Total: 113 mg/dL (ref 100–199)
HDL: 48 mg/dL (ref >39)
LDL Chol Calc (NIH): 43 mg/dL (ref 0–99)
Triglycerides: 126 mg/dL (ref 0–149)
VLDL Cholesterol Cal: 22 mg/dL (ref 5–40)

## 2024-08-28 LAB — BASIC METABOLIC PANEL WITH GFR
BUN/Creatinine Ratio: 19 (ref 10–24)
BUN: 41 mg/dL — ABNORMAL HIGH (ref 8–27)
CO2: 23 mmol/L (ref 20–29)
Calcium: 8.6 mg/dL (ref 8.6–10.2)
Chloride: 106 mmol/L (ref 96–106)
Creatinine, Ser: 2.17 mg/dL — ABNORMAL HIGH (ref 0.76–1.27)
Glucose: 85 mg/dL (ref 70–99)
Potassium: 4.6 mmol/L (ref 3.5–5.2)
Sodium: 142 mmol/L (ref 134–144)
eGFR: 30 mL/min/1.73 — ABNORMAL LOW (ref >59)

## 2024-08-28 LAB — BRAIN NATRIURETIC PEPTIDE: BNP: 951.9 pg/mL — ABNORMAL HIGH (ref 0.0–100.0)

## 2024-08-28 NOTE — Progress Notes (Signed)
 PCP: Arloa Elsie SAUNDERS, MD HF Cardiology: Dr. Rolan  Chief complaint: CHF.   78 y.o. with history of CAD s/p CABG, ischemic cardiomyopathy, and CKD stage 3 was referred by Bernardino Bring PA, for evaluation of CHF. Patient noted exertional dyspnea in early 2025.  Echo in 1/25 showed EF 35-40%.  He then had a cath showing severe 3 vessel disease in 2/25.  In 3/25, he had CABG with LIMA-LAD, SVG-PDA, and SVG-OM1.  Post-op period was complicated by atrial fibrillation and a CVA. During cardiac rehab, patient noted ongoing exertional dyspnea.  Echo in 7/25 showed persistently low EF, 25-30%.  He had a cardiac PET in 8/25 showing a large, severe basal to apical reversible inferior perfusion defect.  LHC in 9/25 showed SVG-OM1 and SVG-PDA totally occluded, LIMA patent-LAD patent, 95% mid RCA and occluded PDA with left=>right collaterals, 90% proximal/mid LCx.  Patient had DES mid RCA and DES proximal LCx into OM1.   Patient returns for followup of CHF, CAD.  He has been doing well recently.  No significant exertional dyspnea or chest pain.  No orthopnea/PND.  Tolerating cardiac meds without lightheadedness.   ECG (personally reviewed): NSR, LBBB 162 msec  Labs (9/25): K 4.9, creatinine 2.28, Lp(a) 77 Labs (10/25): K 4.5, creatinine 2.38  PMH: 1. Chronic LBBB 2. Hyperlipidemia: Statin intolerant.  3. HTN 4. GERD 5. CKD stage 3 6. Chronic systolic CHF: Ischemic cardiomyopathy.   - Echo (1/25): EF 35-40% - Echo (7/25): EF 25-30%, mild MR, mild-moderate TR.  7. Atrial fibrillation: Paroxysmal. Noted post-op CABG.  - Zio (5/25): no atrial fibrillation, rare PVCs.  8. CVA: Post-op CABG.  9. CAD: LHC in 2/25 with severe 3 vessel disease.  - CABG x 3 with LIMA-LAD, SVG-PDA, SVG-OM1 in 3/25.  - Cardiac PET (8/25): large, severe basal to apical reversible inferior perfusion defect.  - LHC (9/25): SVG-OM1 and SVG-PDA totally occluded, LIMA patent-LAD patent, 95% mid RCA and occluded PDA with left=>right  collaterals, 90% proximal/mid LCx.  Patient had DES mid RCA and DES proximal LCx into OM1.   FH: Strong history of CAD. Father with CAD, CHF; brother with sudden death; sister with CAD.   SH: Married, lives in [place], no smoking/ETOH.    ROS: All systems reviewed and negative except as per HPI.   Current Outpatient Medications  Medication Sig Dispense Refill   apixaban  (ELIQUIS ) 5 MG TABS tablet Take 1 tablet (5 mg total) by mouth 2 (two) times daily. 180 tablet 3   cholecalciferol  (VITAMIN D3) 25 MCG (1000 UNIT) tablet Take 1 tablet (1,000 Units total) by mouth daily. 30 tablet 0   empagliflozin  (JARDIANCE ) 10 MG TABS tablet Take 1 tablet (10 mg total) by mouth daily before breakfast. 90 tablet 3   Evolocumab  (REPATHA  SURECLICK) 140 MG/ML SOAJ Inject 140 mg into the skin every 14 (fourteen) days. 2 mL 2   ezetimibe  (ZETIA ) 10 MG tablet Take 1 tablet (10 mg total) by mouth daily. 90 tablet 3   MAGNESIUM  PO Take 1 tablet by mouth at bedtime.     Multiple Vitamins-Minerals (PRESERVISION AREDS 2) CAPS Take 1 capsule by mouth in the morning and at bedtime.     nitroGLYCERIN  (NITROSTAT ) 0.4 MG SL tablet Place 1 tablet (0.4 mg total) under the tongue every 5 (five) minutes as needed for chest pain. 25 tablet 0   Omega-3 Fatty Acids (FISH OIL) 1000 MG CAPS Take 1,000 mg by mouth daily.     clopidogrel  (PLAVIX ) 75 MG tablet Take 1  tablet (75 mg total) by mouth daily with breakfast. 90 tablet 3   famotidine  (PEPCID ) 20 MG tablet Take 1 tablet (20 mg total) by mouth every evening. 90 tablet 0   furosemide  (LASIX ) 20 MG tablet Take 1 tablet (20 mg total) by mouth every other day. 45 tablet 3   levothyroxine  (SYNTHROID ) 75 MCG tablet Take 1 tablet (75 mcg total) by mouth every morning. 90 tablet 1   metoprolol  succinate (TOPROL -XL) 50 MG 24 hr tablet Take 1 tablet (50 mg total) by mouth daily. Take with or immediately following a meal. 90 tablet 3   sacubitril -valsartan  (ENTRESTO ) 49-51 MG Take 1 tablet  by mouth 2 (two) times daily. 180 tablet 3   No current facility-administered medications for this visit.   BP 138/68   Pulse 62   Wt 192 lb 6.4 oz (87.3 kg)   SpO2 98%   BMI 29.69 kg/m  General: NAD Neck: No JVD, no thyromegaly or thyroid  nodule.  Lungs: Clear to auscultation bilaterally with normal respiratory effort. CV: Nondisplaced PMI.  Heart regular S1/S2, no S3/S4, no murmur.  No peripheral edema.  No carotid bruit.  Normal pedal pulses.  Abdomen: Soft, nontender, no hepatosplenomegaly, no distention.  Skin: Intact without lesions or rashes.  Neurologic: Alert and oriented x 3.  Psych: Normal affect. Extremities: No clubbing or cyanosis.  HEENT: Normal.   Assessment/Plan: 1. CAD: CABG in 3/25 with LIMA-LAD, SVG-PDA, and SVG-OM1.  With ongoing exertional symptoms, cardiac PET in 8/25 was done showing reversible inferior defect.  LHC in 9/25 showed SVG-OM1 and SVG-PDA totally occluded, LIMA patent-LAD patent, 95% mid RCA and occluded PDA with left=>right collaterals, 90% proximal/mid LCx.  Patient had DES mid RCA and DES proximal LCx into OM1. Doing better now, no significant exertional symptoms.  - Continue Plavix , no ASA given apixaban  use.  - Continue Repatha  and Zetia , check lipids today with goal LDL < 55.  2. CKD stage 3: Creatinine 2.38 on last BMET, this is baseline.  - Nephrology referral.  - Continue Jardiance .  - Check BMET today.  3. Chronic systolic CHF: Ischemic cardiomyopathy.  Echo in 7/25 showed EF 25-30%, mild MR, mild-moderate TR. He is not volume overloaded on exam, NYHA class II symptoms, overall feeling well.  - Repeat echo in 3 months post-PCI to assess need for ICD (scheduled 10/01/24).  He has chronic wide LBBB, so would get CRT-D.  - Continue Jardiance  10 mg daily as above.  - Decrease Lasix  to 20 mg every other day.  - Increase Toprol  XL to 50 mg daily.  - Continue Entresto  49/51 bid.  - Would like to eventually add spironolactone as well if  creatinine and K remain stable.  4. Atrial fibrillation: Paroxysmal.  He is in NSR.  - Continue apixaban .  5. H/o CVA: Post-op CABG.  6. Hyperlipidemia: He is intolerant of statins, now on Zetia  and Repatha .  - Check lipids today, goal LDL < 55.    Followup in 6 wks with APP for med titration (?add spironolactone).   I spent 31 minutes reviewing records, interviewing/examining patient, and managing orders.   Ezra Shuck 08/28/2024

## 2024-09-02 ENCOUNTER — Telehealth: Payer: Self-pay

## 2024-09-02 NOTE — Telephone Encounter (Signed)
 Pt had requested to see a nephrologist in [place]. Placed referral for [facility]. Pt agreeable.

## 2024-09-06 ENCOUNTER — Other Ambulatory Visit: Payer: Self-pay

## 2024-09-06 MED ORDER — REPATHA SURECLICK 140 MG/ML ~~LOC~~ SOAJ
140.0000 mg | SUBCUTANEOUS | 2 refills | Status: AC
  Filled 2024-09-06: qty 2, 28d supply, fill #0
  Filled 2024-10-04: qty 2, 28d supply, fill #1
  Filled 2024-11-07: qty 2, 28d supply, fill #2

## 2024-09-07 ENCOUNTER — Other Ambulatory Visit: Payer: Self-pay

## 2024-10-01 ENCOUNTER — Ambulatory Visit: Attending: Cardiology

## 2024-10-01 DIAGNOSIS — I5022 Chronic systolic (congestive) heart failure: Secondary | ICD-10-CM

## 2024-10-01 LAB — ECHOCARDIOGRAM COMPLETE
AR max vel: 1.31 cm2
AV Area VTI: 1.3 cm2
AV Area mean vel: 1.29 cm2
AV Mean grad: 9 mmHg
AV Peak grad: 18.3 mmHg
Ao pk vel: 2.14 m/s
Area-P 1/2: 3.27 cm2
S' Lateral: 4.69 cm

## 2024-10-01 MED ORDER — PERFLUTREN LIPID MICROSPHERE
1.0000 mL | INTRAVENOUS | Status: AC | PRN
  Administered 2024-10-01: 09:00:00 3 mL via INTRAVENOUS

## 2024-10-02 ENCOUNTER — Ambulatory Visit (HOSPITAL_COMMUNITY): Payer: Self-pay | Admitting: Cardiology

## 2024-10-11 ENCOUNTER — Other Ambulatory Visit: Payer: Self-pay | Admitting: Nephrology

## 2024-10-11 DIAGNOSIS — I129 Hypertensive chronic kidney disease with stage 1 through stage 4 chronic kidney disease, or unspecified chronic kidney disease: Secondary | ICD-10-CM

## 2024-10-11 DIAGNOSIS — E785 Hyperlipidemia, unspecified: Secondary | ICD-10-CM

## 2024-10-11 DIAGNOSIS — N1832 Chronic kidney disease, stage 3b: Secondary | ICD-10-CM

## 2024-10-11 DIAGNOSIS — R809 Proteinuria, unspecified: Secondary | ICD-10-CM

## 2024-10-14 ENCOUNTER — Ambulatory Visit

## 2024-10-14 ENCOUNTER — Telehealth: Payer: Self-pay | Admitting: Family

## 2024-10-14 NOTE — Progress Notes (Unsigned)
 "  Advanced Heart Failure Clinic Note    PCP: Arloa Elsie SAUNDERS, MD HF Cardiology: Dr. Rolan  Chief complaint: fatigue   HPI:   Mr Tyler David is a 78 y.o. male with a history of CAD s/p CABG, ischemic cardiomyopathy, and CKD stage 3 was referred by Bernardino Bring PA, for evaluation of CHF. Patient noted exertional dyspnea in early 2025.  Echo in 1/25 showed EF 35-40%.  He then had a cath showing severe 3 vessel disease in 2/25.  In 3/25, he had CABG with LIMA-LAD, SVG-PDA, and SVG-OM1.  Post-op period was complicated by atrial fibrillation and a CVA. During cardiac rehab, patient noted ongoing exertional dyspnea.  Echo in 7/25 showed persistently low EF, 25-30%.  He had a cardiac PET in 8/25 showing a large, severe basal to apical reversible inferior perfusion defect.  LHC in 9/25 showed SVG-OM1 and SVG-PDA totally occluded, LIMA patent-LAD patent, 95% mid RCA and occluded PDA with left=>right collaterals, 90% proximal/mid LCx.  Patient had DES mid RCA and DES proximal LCx into OM1.   He presents today, with his wife, for a HF follow-up visit with a chief complaint of fatigue. Has associated minimal shortness of breath, sinus congestion. Denies chest pain, palpitations, dizziness or pedal edema. Concerned about his kidney function and says that he just had a renal ultrasound done today.   ECG not done  Labs (9/25): K 4.9, creatinine 2.28, Lp(a) 77 Labs (10/25): K 4.5, creatinine 2.38 Labs (12/25): K 4.7, creatinine 2.02, Mg 2.4, Hg 14.4  PMH: 1. Chronic LBBB 2. Hyperlipidemia: Statin intolerant.  3. HTN 4. GERD 5. CKD stage 3 6. Chronic systolic CHF: Ischemic cardiomyopathy.   - Echo (1/25): EF 35-40% - Echo (7/25): EF 25-30%, mild MR, mild-moderate TR.  - Echo (12/25): EF 25-30%, G1DD, normal RV, mild/ mod MR 7. Atrial fibrillation: Paroxysmal. Noted post-op CABG.  - Zio (5/25): no atrial fibrillation, rare PVCs.  8. CVA: Post-op CABG.  9. CAD: LHC in 2/25 with severe 3 vessel disease.   - CABG x 3 with LIMA-LAD, SVG-PDA, SVG-OM1 in 3/25.  - Cardiac PET (8/25): large, severe basal to apical reversible inferior perfusion defect.  - LHC (9/25): SVG-OM1 and SVG-PDA totally occluded, LIMA patent-LAD patent, 95% mid RCA and occluded PDA with left=>right collaterals, 90% proximal/mid LCx.  Patient had DES mid RCA and DES proximal LCx into OM1.   FH: Strong history of CAD. Father with CAD, CHF; brother with sudden death; sister with CAD.   SH: Married, lives in [place], no smoking/ETOH.    ROS: All systems reviewed and negative except as per HPI.   Current Outpatient Medications  Medication Sig Dispense Refill   apixaban  (ELIQUIS ) 5 MG TABS tablet Take 1 tablet (5 mg total) by mouth 2 (two) times daily. 180 tablet 3   cholecalciferol  (VITAMIN D3) 25 MCG (1000 UNIT) tablet Take 1 tablet (1,000 Units total) by mouth daily. 30 tablet 0   clopidogrel  (PLAVIX ) 75 MG tablet Take 1 tablet (75 mg total) by mouth daily with breakfast. 90 tablet 3   empagliflozin  (JARDIANCE ) 10 MG TABS tablet Take 1 tablet (10 mg total) by mouth daily before breakfast. 90 tablet 3   Evolocumab  (REPATHA  SURECLICK) 140 MG/ML SOAJ Inject 140 mg into the skin every 14 (fourteen) days. 2 mL 2   ezetimibe  (ZETIA ) 10 MG tablet Take 1 tablet (10 mg total) by mouth daily. 90 tablet 3   famotidine  (PEPCID ) 20 MG tablet Take 1 tablet (20 mg total) by mouth  every evening. 90 tablet 0   furosemide  (LASIX ) 20 MG tablet Take 1 tablet (20 mg total) by mouth every other day. 45 tablet 3   levothyroxine  (SYNTHROID ) 75 MCG tablet Take 1 tablet (75 mcg total) by mouth every morning. 90 tablet 1   MAGNESIUM  PO Take 1 tablet by mouth at bedtime.     metoprolol  succinate (TOPROL -XL) 50 MG 24 hr tablet Take 1 tablet (50 mg total) by mouth daily. Take with or immediately following a meal. 90 tablet 3   Multiple Vitamins-Minerals (PRESERVISION AREDS 2) CAPS Take 1 capsule by mouth in the morning and at bedtime.     nitroGLYCERIN   (NITROSTAT ) 0.4 MG SL tablet Place 1 tablet (0.4 mg total) under the tongue every 5 (five) minutes as needed for chest pain. 25 tablet 0   Omega-3 Fatty Acids (FISH OIL) 1000 MG CAPS Take 1,000 mg by mouth daily.     sacubitril -valsartan  (ENTRESTO ) 49-51 MG Take 1 tablet by mouth 2 (two) times daily. 180 tablet 3   No current facility-administered medications for this visit.   Vitals:   10/15/24 1430  BP: (!) 143/65  Pulse: (!) 59  SpO2: 99%  Weight: 191 lb 9.6 oz (86.9 kg)   Wt Readings from Last 3 Encounters:  10/15/24 191 lb 9.6 oz (86.9 kg)  08/27/24 192 lb 6.4 oz (87.3 kg)  07/26/24 192 lb 6.4 oz (87.3 kg)   Lab Results  Component Value Date   CREATININE 2.17 (H) 08/27/2024   CREATININE 2.38 (H) 07/26/2024   CREATININE 2.14 (H) 07/16/2024    Physical Exam:   General: Well appearing.  Cor: No JVD. Regular rhythm, bradycardic.  Lungs: clear Abdomen: soft, nontender, nondistended. Extremities: no edema Neuro:. Affect pleasant   Assessment/Plan: 1. CAD: CABG in 3/25 with LIMA-LAD, SVG-PDA, and SVG-OM1.  With ongoing exertional symptoms, cardiac PET in 8/25 was done showing reversible inferior defect.  LHC in 9/25 showed SVG-OM1 and SVG-PDA totally occluded, LIMA patent-LAD patent, 95% mid RCA and occluded PDA with left=>right collaterals, 90% proximal/mid LCx.  Patient had DES mid RCA and DES proximal LCx into OM1. Doing better now, no significant exertional symptoms.  - Continue Plavix , no ASA given apixaban  use.  - Continue Repatha  and Zetia , LDL 08/27/24 was 43 which is under goal LDL < 55.  2. CKD stage 3: Creatinine 2.02 on last BMET, this is baseline.  - saw nephrology (Tyler David) 12/25 - Continue Jardiance .  - Check BMET next visit 3. Chronic systolic CHF: Ischemic cardiomyopathy.  Echo in 7/25 showed EF 25-30%, mild MR, mild-moderate TR. Euvolemic, NYHA class II symptoms. - Echo (12/25): EF 25-30%, G1DD, normal RV, mild/ mod MR - Since EF remains <35%, will refer  to EP to discuss biventricular pacemaker/ ICD. He has chronic wide LBBB, so would get CRT-D.  - Continue Jardiance  10 mg daily as above.  - Continue Lasix  20 mg every other day.  - Continue Toprol  XL 50 mg daily.  - Continue Entresto  49/51 bid.  - Discussed adding spironolactone but they would like to defer today and recheck labs next visit to make sure renal function remains stable 4. Atrial fibrillation: Paroxysmal.   - Continue apixaban .  5. H/o CVA: Post-op CABG.  6. Hyperlipidemia: He is intolerant of statins, now on Zetia  and Repatha .  - LDL 08/27/24 was 43 which is under goal LDL < 55.     Return in 1 month, sooner if needed. Check BMET next visit and if renal function stable, begin spironolactone  12.5mg  daily.   I spent 40 minutes reviewing records, interviewing/ examing patient and managing plan/ orders.   Ellouise DELENA Class, FNP-C 10/14/2024  "

## 2024-10-14 NOTE — Telephone Encounter (Signed)
 Called to confirm/remind patient of their appointment at the Advanced Heart Failure Clinic on 10/15/24.   Appointment:   [x] Confirmed  [] Left mess   [] No answer/No voice mail  [] VM Full/unable to leave message  [] Phone not in service  Patient reminded to bring all medications and/or complete list.  Confirmed patient has transportation. Gave directions, instructed to utilize valet parking.

## 2024-10-15 ENCOUNTER — Ambulatory Visit
Admission: RE | Admit: 2024-10-15 | Discharge: 2024-10-15 | Disposition: A | Discharge status: Home / Self Care | Source: Ambulatory Visit | Attending: Nephrology | Admitting: Nephrology

## 2024-10-15 ENCOUNTER — Other Ambulatory Visit: Payer: Self-pay

## 2024-10-15 ENCOUNTER — Ambulatory Visit: Attending: Family | Admitting: Family

## 2024-10-15 ENCOUNTER — Encounter: Payer: Self-pay | Admitting: Family

## 2024-10-15 VITALS — BP 143/65 | HR 59 | Wt 191.6 lb

## 2024-10-15 DIAGNOSIS — E785 Hyperlipidemia, unspecified: Secondary | ICD-10-CM | POA: Diagnosis present

## 2024-10-15 DIAGNOSIS — Z955 Presence of coronary angioplasty implant and graft: Secondary | ICD-10-CM | POA: Insufficient documentation

## 2024-10-15 DIAGNOSIS — I639 Cerebral infarction, unspecified: Secondary | ICD-10-CM | POA: Diagnosis not present

## 2024-10-15 DIAGNOSIS — I255 Ischemic cardiomyopathy: Secondary | ICD-10-CM | POA: Insufficient documentation

## 2024-10-15 DIAGNOSIS — Z7984 Long term (current) use of oral hypoglycemic drugs: Secondary | ICD-10-CM | POA: Insufficient documentation

## 2024-10-15 DIAGNOSIS — N183 Chronic kidney disease, stage 3 unspecified: Secondary | ICD-10-CM | POA: Diagnosis not present

## 2024-10-15 DIAGNOSIS — Z951 Presence of aortocoronary bypass graft: Secondary | ICD-10-CM | POA: Insufficient documentation

## 2024-10-15 DIAGNOSIS — I129 Hypertensive chronic kidney disease with stage 1 through stage 4 chronic kidney disease, or unspecified chronic kidney disease: Secondary | ICD-10-CM | POA: Diagnosis present

## 2024-10-15 DIAGNOSIS — I2510 Atherosclerotic heart disease of native coronary artery without angina pectoris: Secondary | ICD-10-CM | POA: Diagnosis not present

## 2024-10-15 DIAGNOSIS — R809 Proteinuria, unspecified: Secondary | ICD-10-CM | POA: Diagnosis present

## 2024-10-15 DIAGNOSIS — I480 Paroxysmal atrial fibrillation: Secondary | ICD-10-CM

## 2024-10-15 DIAGNOSIS — Z7902 Long term (current) use of antithrombotics/antiplatelets: Secondary | ICD-10-CM | POA: Insufficient documentation

## 2024-10-15 DIAGNOSIS — R0609 Other forms of dyspnea: Secondary | ICD-10-CM | POA: Insufficient documentation

## 2024-10-15 DIAGNOSIS — N1832 Chronic kidney disease, stage 3b: Secondary | ICD-10-CM | POA: Diagnosis present

## 2024-10-15 DIAGNOSIS — Z79890 Hormone replacement therapy (postmenopausal): Secondary | ICD-10-CM | POA: Insufficient documentation

## 2024-10-15 DIAGNOSIS — Z79899 Other long term (current) drug therapy: Secondary | ICD-10-CM | POA: Insufficient documentation

## 2024-10-15 DIAGNOSIS — I130 Hypertensive heart and chronic kidney disease with heart failure and stage 1 through stage 4 chronic kidney disease, or unspecified chronic kidney disease: Secondary | ICD-10-CM | POA: Insufficient documentation

## 2024-10-15 DIAGNOSIS — I2582 Chronic total occlusion of coronary artery: Secondary | ICD-10-CM | POA: Insufficient documentation

## 2024-10-15 DIAGNOSIS — Z8673 Personal history of transient ischemic attack (TIA), and cerebral infarction without residual deficits: Secondary | ICD-10-CM | POA: Insufficient documentation

## 2024-10-15 DIAGNOSIS — I447 Left bundle-branch block, unspecified: Secondary | ICD-10-CM | POA: Insufficient documentation

## 2024-10-15 DIAGNOSIS — Z950 Presence of cardiac pacemaker: Secondary | ICD-10-CM | POA: Insufficient documentation

## 2024-10-15 DIAGNOSIS — R5383 Other fatigue: Secondary | ICD-10-CM | POA: Insufficient documentation

## 2024-10-15 DIAGNOSIS — I5020 Unspecified systolic (congestive) heart failure: Secondary | ICD-10-CM

## 2024-10-15 DIAGNOSIS — I5022 Chronic systolic (congestive) heart failure: Secondary | ICD-10-CM | POA: Insufficient documentation

## 2024-10-15 DIAGNOSIS — Z7901 Long term (current) use of anticoagulants: Secondary | ICD-10-CM | POA: Insufficient documentation

## 2024-10-15 MED ORDER — CLOPIDOGREL BISULFATE 75 MG PO TABS
75.0000 mg | ORAL_TABLET | Freq: Every day | ORAL | 3 refills | Status: AC
  Filled 2024-10-15: qty 90, 90d supply, fill #0

## 2024-10-15 NOTE — Patient Instructions (Addendum)
 Medication Changes:  No medication changes today!   Referrals:  You have been referred to [facility]. They will contact you in order to schedule an appointment.     Follow-Up in: Please follow up with the Advanced Heart Failure Clinic in 1 month with Ellouise Class, FNP.   Thank you for choosing [facility].    At the Advanced Heart Failure Clinic, you and your health needs are our priority. We have a designated team specialized in the treatment of Heart Failure. This Care Team includes your primary Heart Failure Specialized Cardiologist (physician), Advanced Practice Providers (APPs- Physician Assistants and Nurse Practitioners), and Pharmacist who all work together to provide you with the care you need, when you need it.   You may see any of the following providers on your designated Care Team at your next follow up:  Dr. Toribio Fuel Dr. Ezra Shuck Dr. Ria Commander Dr. Morene Brownie Ellouise Class, FNP Jaun Bash, RPH-CPP  Please be sure to bring in all your medications bottles to every appointment.   Need to Contact Us :  If you have any questions or concerns before your next appointment please send us  a message through [place] or call our office at [phone].    TO LEAVE A MESSAGE FOR THE NURSE SELECT OPTION 2, PLEASE LEAVE A MESSAGE INCLUDING: YOUR NAME DATE OF BIRTH CALL BACK NUMBER REASON FOR CALL**this is important as we prioritize the call backs  YOU WILL RECEIVE A CALL BACK THE SAME DAY AS LONG AS YOU CALL BEFORE 4:00 PM

## 2024-10-16 ENCOUNTER — Other Ambulatory Visit: Payer: Self-pay

## 2024-10-16 MED ORDER — LEVOTHYROXINE SODIUM 75 MCG PO TABS
75.0000 ug | ORAL_TABLET | Freq: Every day | ORAL | 3 refills | Status: AC
  Filled 2024-10-16: qty 90, 90d supply, fill #0

## 2024-10-22 ENCOUNTER — Other Ambulatory Visit: Payer: Self-pay

## 2024-11-07 ENCOUNTER — Other Ambulatory Visit: Payer: Self-pay

## 2024-11-11 NOTE — Progress Notes (Unsigned)
 "  Advanced Heart Failure Clinic Note    PCP: Arloa Elsie SAUNDERS, MD HF Cardiology: Dr. Rolan  Chief complaint:   HPI:   Tyler David is a 79 y.o. male with a history of CAD s/p CABG, ischemic cardiomyopathy, and CKD stage 3 was referred by Bernardino Bring PA, for evaluation of CHF. Patient noted exertional dyspnea in early 2025.  Echo in 1/25 showed EF 35-40%.  He then had a cath showing severe 3 vessel disease in 2/25.  In 3/25, he had CABG with LIMA-LAD, SVG-PDA, and SVG-OM1.  Post-op period was complicated by atrial fibrillation and a CVA. During cardiac rehab, patient noted ongoing exertional dyspnea.  Echo in 7/25 showed persistently low EF, 25-30%.  He had a cardiac PET in 8/25 showing a large, severe basal to apical reversible inferior perfusion defect.  LHC in 9/25 showed SVG-OM1 and SVG-PDA totally occluded, LIMA patent-LAD patent, 95% mid RCA and occluded PDA with left=>right collaterals, 90% proximal/mid LCx.  Patient had DES mid RCA and DES proximal LCx into OM1.   He presents today, with his wife, for a HF follow-up visit with a chief complaint of    ECG not done  Labs (9/25): K 4.9, creatinine 2.28, Lp(a) 77 Labs (10/25): K 4.5, creatinine 2.38 Labs (12/25): K 4.7, creatinine 2.02, Mg 2.4, Hg 14.4, TSH 2.245, hepatitis negative  PMH: 1. Chronic LBBB 2. Hyperlipidemia: Statin intolerant.  3. HTN 4. GERD 5. CKD stage 3 - renal ultrasound 12/25: No hydronephrosis, moderate right renal cortical atrophy 6. Chronic systolic CHF: Ischemic cardiomyopathy.   - Echo (1/25): EF 35-40% - Echo (7/25): EF 25-30%, mild Tyler, mild-moderate TR.  - Echo (12/25): EF 25-30%, G1DD, normal RV, mild/ mod Tyler 7. Atrial fibrillation: Paroxysmal. Noted post-op CABG.  - Zio (5/25): no atrial fibrillation, rare PVCs.  8. CVA: Post-op CABG.  9. CAD: LHC in 2/25 with severe 3 vessel disease.  - CABG x 3 with LIMA-LAD, SVG-PDA, SVG-OM1 in 3/25.  - Cardiac PET (8/25): large, severe basal to apical  reversible inferior perfusion defect.  - LHC (9/25): SVG-OM1 and SVG-PDA totally occluded, LIMA patent-LAD patent, 95% mid RCA and occluded PDA with left=>right collaterals, 90% proximal/mid LCx.  Patient had DES mid RCA and DES proximal LCx into OM1.   FH: Strong history of CAD. Father with CAD, CHF; brother with sudden death; sister with CAD.   SH: Married, lives in [place], no smoking/ETOH.    ROS: All systems reviewed and negative except as per HPI.   Current Outpatient Medications  Medication Sig Dispense Refill   apixaban  (ELIQUIS ) 5 MG TABS tablet Take 1 tablet (5 mg total) by mouth 2 (two) times daily. 180 tablet 3   cholecalciferol  (VITAMIN D3) 25 MCG (1000 UNIT) tablet Take 1 tablet (1,000 Units total) by mouth daily. 30 tablet 0   clopidogrel  (PLAVIX ) 75 MG tablet Take 1 tablet (75 mg total) by mouth daily with breakfast. 90 tablet 3   empagliflozin  (JARDIANCE ) 10 MG TABS tablet Take 1 tablet (10 mg total) by mouth daily before breakfast. 90 tablet 3   Evolocumab  (REPATHA  SURECLICK) 140 MG/ML SOAJ Inject 140 mg into the skin every 14 (fourteen) days. 2 mL 2   ezetimibe  (ZETIA ) 10 MG tablet Take 1 tablet (10 mg total) by mouth daily. 90 tablet 3   famotidine  (PEPCID ) 20 MG tablet Take 1 tablet (20 mg total) by mouth every evening. 90 tablet 0   furosemide  (LASIX ) 20 MG tablet Take 1 tablet (20 mg total)  by mouth every other day. 45 tablet 3   levothyroxine  (SYNTHROID ) 75 MCG tablet Take 1 tablet (75 mcg total) by mouth every morning. 90 tablet 1   levothyroxine  (SYNTHROID ) 75 MCG tablet Take 1 tablet (75 mcg total) by mouth daily. Take on an empty stomach and wait at least 30 minutes before eating/drinking anything else. 90 tablet 3   MAGNESIUM  PO Take 1 tablet by mouth at bedtime.     metoprolol  succinate (TOPROL -XL) 50 MG 24 hr tablet Take 1 tablet (50 mg total) by mouth daily. Take with or immediately following a meal. 90 tablet 3   Multiple Vitamins-Minerals (PRESERVISION AREDS  2) CAPS Take 1 capsule by mouth in the morning and at bedtime.     nitroGLYCERIN  (NITROSTAT ) 0.4 MG SL tablet Place 1 tablet (0.4 mg total) under the tongue every 5 (five) minutes as needed for chest pain. 25 tablet 0   Omega-3 Fatty Acids (FISH OIL) 1000 MG CAPS Take 1,000 mg by mouth daily.     sacubitril -valsartan  (ENTRESTO ) 49-51 MG Take 1 tablet by mouth 2 (two) times daily. 180 tablet 3   vitamin k 100 MCG tablet Take 100 mcg by mouth daily.     No current facility-administered medications for this visit.      Physical Exam:   General: Well appearing.  Cor: No JVD. Regular rhythm, bradycardic.  Lungs: clear Abdomen: soft, nontender, nondistended. Extremities: no edema Neuro:. Affect pleasant   Assessment/Plan: 1. CAD: CABG in 3/25 with LIMA-LAD, SVG-PDA, and SVG-OM1.  With ongoing exertional symptoms, cardiac PET in 8/25 was done showing reversible inferior defect.  LHC in 9/25 showed SVG-OM1 and SVG-PDA totally occluded, LIMA patent-LAD patent, 95% mid RCA and occluded PDA with left=>right collaterals, 90% proximal/mid LCx.  Patient had DES mid RCA and DES proximal LCx into OM1. Doing better now, no significant exertional symptoms.  - Continue Plavix , no ASA given apixaban  use.  - Continue Repatha  and Zetia , LDL 08/27/24 was 43 which is under goal LDL < 55.  2. CKD stage 3: Creatinine 2.02 on last BMET, this is baseline.  - saw nephrology (Kolluru) 12/25 - Continue Jardiance .  - BMET today 3. Chronic systolic CHF: Ischemic cardiomyopathy.  Echo in 7/25 showed EF 25-30%, mild Tyler, mild-moderate TR. Euvolemic, NYHA class II symptoms. - Echo (12/25): EF 25-30%, G1DD, normal RV, mild/ mod Tyler - Since EF remains <35%, will refer to EP to discuss biventricular pacemaker/ ICD. He has chronic wide LBBB, so would get CRT-D.  - Continue Jardiance  10 mg daily. - Continue Lasix  20 mg every other day.  - Continue Toprol  XL 50 mg daily.  - Continue Entresto  49/51 bid.  - Discussed adding  spironolactone but they would like to defer today and recheck labs next visit to make sure renal function remains stable 4. Atrial fibrillation: Paroxysmal.   - Continue apixaban .  5. H/o CVA: Post-op CABG.  6. Hyperlipidemia: He is intolerant of statins, now on Zetia  and Repatha .  - LDL 08/27/24 was 43 which is under goal LDL < 55.       Ellouise DELENA Class, FNP-C 11/11/24  "

## 2024-11-12 ENCOUNTER — Telehealth: Payer: Self-pay

## 2024-11-12 ENCOUNTER — Other Ambulatory Visit: Payer: Self-pay

## 2024-11-12 ENCOUNTER — Ambulatory Visit: Admitting: Family

## 2024-11-12 NOTE — Telephone Encounter (Signed)
 Callers name: self Relation to patient:   Call back phone #: in chart  Pharmacy (if applicable):   Issue/reason for call: pt called because he has not received a call from EP to schedule his appt from the referral from Ellouise Class, FNP.

## 2024-11-13 NOTE — Telephone Encounter (Signed)
 lmov

## 2024-12-03 ENCOUNTER — Ambulatory Visit: Admitting: Family

## 2025-01-07 ENCOUNTER — Ambulatory Visit: Admitting: Cardiology
